# Patient Record
Sex: Female | Born: 1955
Health system: Southern US, Community
[De-identification: ages and names within clinical notes are randomized; demographics above are authoritative.]

## PROBLEM LIST (undated history)

## (undated) DIAGNOSIS — R011 Cardiac murmur, unspecified: Secondary | ICD-10-CM

## (undated) DIAGNOSIS — M545 Low back pain, unspecified: Secondary | ICD-10-CM

## (undated) DIAGNOSIS — M1990 Unspecified osteoarthritis, unspecified site: Secondary | ICD-10-CM

## (undated) DIAGNOSIS — D649 Anemia, unspecified: Secondary | ICD-10-CM

## (undated) DIAGNOSIS — E785 Hyperlipidemia, unspecified: Secondary | ICD-10-CM

## (undated) DIAGNOSIS — K219 Gastro-esophageal reflux disease without esophagitis: Secondary | ICD-10-CM

## (undated) DIAGNOSIS — T7840XA Allergy, unspecified, initial encounter: Secondary | ICD-10-CM

## (undated) DIAGNOSIS — I10 Essential (primary) hypertension: Secondary | ICD-10-CM

## (undated) HISTORY — PX: NECK SURGERY: SHX720

## (undated) HISTORY — PX: CARPAL TUNNEL RELEASE: SHX101

## (undated) HISTORY — PX: TUBAL LIGATION: SHX77

## (undated) HISTORY — DX: Hyperlipidemia, unspecified: E78.5

## (undated) HISTORY — DX: Allergy, unspecified, initial encounter: T78.40XA

---

## 2004-10-12 ENCOUNTER — Ambulatory Visit: Payer: Self-pay | Admitting: General Practice

## 2004-12-22 ENCOUNTER — Emergency Department: Payer: Self-pay | Admitting: Emergency Medicine

## 2005-08-11 ENCOUNTER — Ambulatory Visit (HOSPITAL_BASED_OUTPATIENT_CLINIC_OR_DEPARTMENT_OTHER): Admission: RE | Admit: 2005-08-11 | Discharge: 2005-08-11 | Payer: Self-pay | Admitting: Orthopedic Surgery

## 2005-11-07 ENCOUNTER — Ambulatory Visit: Payer: Self-pay | Admitting: Family Medicine

## 2005-11-11 ENCOUNTER — Emergency Department: Payer: Self-pay | Admitting: Emergency Medicine

## 2006-03-16 ENCOUNTER — Other Ambulatory Visit: Payer: Self-pay

## 2006-03-16 ENCOUNTER — Emergency Department: Payer: Self-pay | Admitting: Emergency Medicine

## 2006-03-17 ENCOUNTER — Ambulatory Visit: Payer: Self-pay | Admitting: Emergency Medicine

## 2006-06-02 ENCOUNTER — Other Ambulatory Visit: Payer: Self-pay

## 2006-06-02 ENCOUNTER — Emergency Department: Payer: Self-pay | Admitting: Emergency Medicine

## 2006-07-04 ENCOUNTER — Emergency Department: Payer: Self-pay | Admitting: Emergency Medicine

## 2006-08-14 ENCOUNTER — Emergency Department: Payer: Self-pay | Admitting: Emergency Medicine

## 2007-02-15 ENCOUNTER — Emergency Department: Payer: Self-pay | Admitting: Emergency Medicine

## 2007-06-28 ENCOUNTER — Emergency Department: Payer: Self-pay | Admitting: Emergency Medicine

## 2007-07-01 ENCOUNTER — Emergency Department: Payer: Self-pay | Admitting: Emergency Medicine

## 2007-08-10 ENCOUNTER — Emergency Department: Payer: Self-pay | Admitting: Emergency Medicine

## 2007-09-21 ENCOUNTER — Other Ambulatory Visit: Payer: Self-pay

## 2007-09-21 ENCOUNTER — Emergency Department: Payer: Self-pay | Admitting: Emergency Medicine

## 2007-09-23 ENCOUNTER — Emergency Department: Payer: Self-pay | Admitting: Emergency Medicine

## 2007-09-23 ENCOUNTER — Other Ambulatory Visit: Payer: Self-pay

## 2008-06-12 ENCOUNTER — Emergency Department: Payer: Self-pay | Admitting: Emergency Medicine

## 2008-06-13 ENCOUNTER — Emergency Department: Payer: Self-pay | Admitting: Internal Medicine

## 2008-09-30 DIAGNOSIS — I10 Essential (primary) hypertension: Secondary | ICD-10-CM | POA: Insufficient documentation

## 2008-09-30 DIAGNOSIS — I459 Conduction disorder, unspecified: Secondary | ICD-10-CM | POA: Insufficient documentation

## 2008-09-30 DIAGNOSIS — R319 Hematuria, unspecified: Secondary | ICD-10-CM | POA: Insufficient documentation

## 2008-10-17 ENCOUNTER — Emergency Department: Payer: Self-pay | Admitting: Emergency Medicine

## 2008-10-22 ENCOUNTER — Ambulatory Visit: Payer: Self-pay | Admitting: Family Medicine

## 2008-12-03 ENCOUNTER — Emergency Department: Payer: Self-pay | Admitting: Internal Medicine

## 2009-02-12 ENCOUNTER — Emergency Department: Payer: Self-pay | Admitting: Emergency Medicine

## 2009-10-14 ENCOUNTER — Emergency Department: Payer: Self-pay

## 2009-10-15 ENCOUNTER — Ambulatory Visit: Payer: Self-pay | Admitting: Family Medicine

## 2009-10-15 DIAGNOSIS — E559 Vitamin D deficiency, unspecified: Secondary | ICD-10-CM | POA: Insufficient documentation

## 2009-11-17 ENCOUNTER — Emergency Department: Payer: Self-pay | Admitting: Emergency Medicine

## 2009-12-24 ENCOUNTER — Ambulatory Visit: Payer: Self-pay | Admitting: Family Medicine

## 2009-12-24 DIAGNOSIS — R609 Edema, unspecified: Secondary | ICD-10-CM | POA: Insufficient documentation

## 2009-12-30 ENCOUNTER — Ambulatory Visit: Payer: Self-pay | Admitting: Orthopedic Surgery

## 2010-01-08 ENCOUNTER — Emergency Department: Payer: Self-pay | Admitting: Unknown Physician Specialty

## 2010-02-02 ENCOUNTER — Ambulatory Visit: Payer: Self-pay | Admitting: Pain Medicine

## 2010-02-15 ENCOUNTER — Ambulatory Visit: Payer: Self-pay | Admitting: Pain Medicine

## 2010-03-24 ENCOUNTER — Ambulatory Visit (HOSPITAL_COMMUNITY): Admission: RE | Admit: 2010-03-24 | Discharge: 2010-03-25 | Payer: Self-pay | Admitting: Neurosurgery

## 2010-04-29 ENCOUNTER — Encounter: Admission: RE | Admit: 2010-04-29 | Discharge: 2010-04-29 | Payer: Self-pay | Admitting: Neurosurgery

## 2010-07-06 ENCOUNTER — Encounter
Admission: RE | Admit: 2010-07-06 | Discharge: 2010-07-06 | Payer: Self-pay | Source: Home / Self Care | Attending: Neurosurgery | Admitting: Neurosurgery

## 2010-08-18 ENCOUNTER — Emergency Department: Payer: Self-pay | Admitting: Unknown Physician Specialty

## 2010-09-08 DIAGNOSIS — R079 Chest pain, unspecified: Secondary | ICD-10-CM

## 2010-09-08 DIAGNOSIS — I200 Unstable angina: Secondary | ICD-10-CM

## 2010-09-09 ENCOUNTER — Observation Stay: Payer: Self-pay | Admitting: Internal Medicine

## 2010-10-08 LAB — BASIC METABOLIC PANEL
CO2: 26 mEq/L (ref 19–32)
Calcium: 9.7 mg/dL (ref 8.4–10.5)
Chloride: 105 mEq/L (ref 96–112)
Creatinine, Ser: 0.75 mg/dL (ref 0.4–1.2)
Glucose, Bld: 76 mg/dL (ref 70–99)

## 2010-10-08 LAB — SURGICAL PCR SCREEN
MRSA, PCR: NEGATIVE
Staphylococcus aureus: POSITIVE — AB

## 2010-10-08 LAB — DIFFERENTIAL
Basophils Relative: 1 % (ref 0–1)
Eosinophils Absolute: 0.1 10*3/uL (ref 0.0–0.7)
Eosinophils Relative: 1 % (ref 0–5)
Monocytes Relative: 7 % (ref 3–12)
Neutrophils Relative %: 47 % (ref 43–77)

## 2010-10-08 LAB — CBC
MCH: 28.5 pg (ref 26.0–34.0)
MCHC: 33.8 g/dL (ref 30.0–36.0)
MCV: 84.2 fL (ref 78.0–100.0)
Platelets: 301 10*3/uL (ref 150–400)

## 2010-11-30 ENCOUNTER — Ambulatory Visit
Admission: RE | Admit: 2010-11-30 | Discharge: 2010-11-30 | Disposition: A | Discharge status: Home / Self Care | Payer: No Typology Code available for payment source | Source: Ambulatory Visit | Attending: Neurological Surgery | Admitting: Neurological Surgery

## 2010-11-30 ENCOUNTER — Other Ambulatory Visit: Payer: Self-pay | Admitting: Neurological Surgery

## 2010-11-30 DIAGNOSIS — M542 Cervicalgia: Secondary | ICD-10-CM

## 2010-12-10 NOTE — Op Note (Signed)
NAME:  Patton, Lindsay               ACCOUNT NO.:  [id number]   MEDICAL RECORD NO.:  [id number]          PATIENT TYPE:  AMB   LOCATION:  DSC                          FACILITY:  [facility]   PHYSICIAN:  Cindee Salt, M.D.       DATE OF BIRTH:  1956/01/13   DATE OF PROCEDURE:  08/11/2005  DATE OF DISCHARGE:                                 OPERATIVE REPORT   PREOPERATIVE DIAGNOSIS:  Carpal tunnel syndrome left hand.   POSTOPERATIVE DIAGNOSIS:  Carpal tunnel syndrome left hand.   OPERATION:  Decompression left median nerve.   SURGEON:  Cindee Salt, M.D.   ASSISTANT:  __________   ANESTHESIA:  Foreign-base IV regional.   HISTORY:  The patient is a 55 year old female with a history of carpal  tunnel syndrome, EMG nerve conduction is positive, which has not responded  to conservative treatment.   PROCEDURE:  The patient was brought to the operating room where a foreign-  base IV regional anesthetic was carried out without difficulty. After both  the patient and the surgeon marked the area, questions were answered. She  was prepped and draped using DuraPrep in the supine position, left arm free.  Adequate anesthesia was afforded and this included supplementation with 1%  Xylocaine without epinephrine. A longitudinal incision was made in the palm,  carried down through the subcutaneous tissue. Bleeders were  electrocauterized. Palmar fascia was split. Superficial palmar arch  identified. The flexor tendon to the ring and little finger identified to  the ulnar side of the median nerve. The carpal retinaculum was incised with  sharp dissection. A right-angle and Sewall retractor placed between the skin  and forearm fascia. The fascia was released for approximately 1.5 cm  proximal to the wrist crease under direct vision. The canal was explored. No  further lesions were identified. Tenosynovial tissue was moderately  thickened. The area of compression to the nerve was apparent. The wound was  irrigated. The skin was closed with interrupted #5-0 nylon sutures. A  sterile compressive dressing and splint was applied. The patient tolerated  the procedure well and was taken to the recovery room for observation in  satisfactory condition. She is discharged to home to return to the hand  center in [place] in 1 week on Vicodin.           ______________________________  Cindee Salt, M.D.     GK/MEDQ  D:  08/11/2005  T:  08/11/2005  Job:  [id number]   cc:   Cindee Salt, M.D.  Fax: [phone]

## 2011-08-16 ENCOUNTER — Ambulatory Visit: Payer: Self-pay | Admitting: Neurosurgery

## 2011-08-25 ENCOUNTER — Ambulatory Visit
Admission: RE | Admit: 2011-08-25 | Discharge: 2011-08-25 | Disposition: A | Discharge status: Home / Self Care | Payer: Medicaid Other | Source: Ambulatory Visit | Attending: Neurosurgery | Admitting: Neurosurgery

## 2011-08-25 ENCOUNTER — Other Ambulatory Visit: Payer: Self-pay | Admitting: Neurosurgery

## 2011-08-25 DIAGNOSIS — M542 Cervicalgia: Secondary | ICD-10-CM

## 2011-10-11 ENCOUNTER — Encounter (HOSPITAL_COMMUNITY): Payer: Self-pay | Admitting: Pharmacy Technician

## 2011-10-13 NOTE — Pre-Procedure Instructions (Signed)
20 KARLIAH KOWALCHUK  10/13/2011   Your procedure is scheduled on:  MARCH 27  Report to [facility] at 6:30 AM.  Call this number if you have problems the morning of surgery: [phone]   Remember:   Do not eat food:After Midnight.  May have clear liquids: up to 4 Hours before arrival. (2:30 AM)  Clear liquids include soda, tea, black coffee, apple or grape juice, broth.  Take these medicines the morning of surgery with A SIP OF WATER: NORVASC   Do not wear jewelry, make-up or nail polish.  Do not wear lotions, powders, or perfumes. You may wear deodorant.  Do not shave 48 hours prior to surgery.  Do not bring valuables to the hospital.  Contacts, dentures or bridgework may not be worn into surgery.  Leave suitcase in the car. After surgery it may be brought to your room.  For patients admitted to the hospital, checkout time is 11:00 AM the day of discharge.   Patients discharged the day of surgery will not be allowed to drive home.  Name and phone number of your driver: NA  Special Instructions: CHG Shower Use Special Wash: 1/2 bottle night before surgery and 1/2 bottle morning of surgery.   Please read over the following fact sheets that you were given: Pain Booklet, MRSA Information and Surgical Site Infection Prevention

## 2011-10-14 ENCOUNTER — Encounter (HOSPITAL_COMMUNITY): Payer: Self-pay

## 2011-10-14 ENCOUNTER — Encounter (HOSPITAL_COMMUNITY)
Admission: RE | Admit: 2011-10-14 | Discharge: 2011-10-14 | Disposition: A | Discharge status: Home / Self Care | Payer: Medicaid Other | Source: Ambulatory Visit | Attending: Anesthesiology | Admitting: Anesthesiology

## 2011-10-14 ENCOUNTER — Encounter (HOSPITAL_COMMUNITY)
Admission: RE | Admit: 2011-10-14 | Discharge: 2011-10-14 | Disposition: A | Discharge status: Home / Self Care | Payer: Medicaid Other | Source: Ambulatory Visit | Attending: Neurosurgery | Admitting: Neurosurgery

## 2011-10-14 ENCOUNTER — Other Ambulatory Visit: Payer: Self-pay

## 2011-10-14 HISTORY — DX: Essential (primary) hypertension: I10

## 2011-10-14 HISTORY — DX: Gastro-esophageal reflux disease without esophagitis: K21.9

## 2011-10-14 LAB — CBC
Hemoglobin: 14.2 g/dL (ref 12.0–15.0)
MCH: 28.9 pg (ref 26.0–34.0)
MCHC: 34.2 g/dL (ref 30.0–36.0)
Platelets: 299 10*3/uL (ref 150–400)
RDW: 12.4 % (ref 11.5–15.5)

## 2011-10-14 LAB — BASIC METABOLIC PANEL
BUN: 7 mg/dL (ref 6–23)
Calcium: 9.8 mg/dL (ref 8.4–10.5)
Creatinine, Ser: 0.66 mg/dL (ref 0.50–1.10)
GFR calc Af Amer: >90 mL/min (ref >90)
GFR calc non Af Amer: >90 mL/min (ref >90)
Glucose, Bld: 92 mg/dL (ref 70–99)
Potassium: 3.8 mEq/L (ref 3.5–5.1)

## 2011-10-14 MED ORDER — CHLORHEXIDINE GLUCONATE 4 % EX LIQD: 1.0000 "application " | Freq: Once | CUTANEOUS | Status: DC

## 2011-10-19 ENCOUNTER — Ambulatory Visit (HOSPITAL_COMMUNITY): Payer: Medicaid Other | Admitting: Anesthesiology

## 2011-10-19 ENCOUNTER — Ambulatory Visit (HOSPITAL_COMMUNITY): Payer: Medicaid Other

## 2011-10-19 ENCOUNTER — Encounter (HOSPITAL_COMMUNITY)
Admission: RE | Disposition: A | Discharge status: Home / Self Care | Payer: Self-pay | Source: Ambulatory Visit | Attending: Neurosurgery

## 2011-10-19 ENCOUNTER — Encounter (HOSPITAL_COMMUNITY): Payer: Self-pay | Admitting: Anesthesiology

## 2011-10-19 ENCOUNTER — Inpatient Hospital Stay (HOSPITAL_COMMUNITY)
Admission: RE | Admit: 2011-10-19 | Discharge: 2011-10-20 | DRG: 473 | Disposition: A | Discharge status: Home / Self Care | Payer: Medicaid Other | Source: Ambulatory Visit | Attending: Neurosurgery | Admitting: Neurosurgery

## 2011-10-19 DIAGNOSIS — Z886 Allergy status to analgesic agent status: Secondary | ICD-10-CM

## 2011-10-19 DIAGNOSIS — Z882 Allergy status to sulfonamides status: Secondary | ICD-10-CM

## 2011-10-19 DIAGNOSIS — Z87891 Personal history of nicotine dependence: Secondary | ICD-10-CM

## 2011-10-19 DIAGNOSIS — M4802 Spinal stenosis, cervical region: Secondary | ICD-10-CM | POA: Diagnosis present

## 2011-10-19 DIAGNOSIS — M47812 Spondylosis without myelopathy or radiculopathy, cervical region: Principal | ICD-10-CM | POA: Diagnosis present

## 2011-10-19 HISTORY — PX: ANTERIOR CERVICAL DECOMP/DISCECTOMY FUSION: SHX1161

## 2011-10-19 SURGERY — ANTERIOR CERVICAL DECOMPRESSION/DISCECTOMY FUSION 1 LEVEL/HARDWARE REMOVAL
Anesthesia: General

## 2011-10-19 MED ORDER — DEXAMETHASONE SODIUM PHOSPHATE 10 MG/ML IJ SOLN
INTRAMUSCULAR | Status: DC | PRN
  Administered 2011-10-19: 10 mg via INTRAVENOUS

## 2011-10-19 MED ORDER — AMLODIPINE BESYLATE 5 MG PO TABS
5.0000 mg | ORAL_TABLET | Freq: Every day | ORAL | Status: DC
  Administered 2011-10-19 – 2011-10-20 (×2): 5 mg via ORAL
  Filled 2011-10-19 (×2): qty 1

## 2011-10-19 MED ORDER — EPHEDRINE SULFATE 50 MG/ML IJ SOLN
INTRAMUSCULAR | Status: DC | PRN
  Administered 2011-10-19: 5 mg via INTRAVENOUS

## 2011-10-19 MED ORDER — CEFAZOLIN SODIUM 1-5 GM-% IV SOLN
INTRAVENOUS | Status: DC | PRN
  Administered 2011-10-19: 1 g via INTRAVENOUS

## 2011-10-19 MED ORDER — ROCURONIUM BROMIDE 100 MG/10ML IV SOLN
INTRAVENOUS | Status: DC | PRN
  Administered 2011-10-19: 50 mg via INTRAVENOUS

## 2011-10-19 MED ORDER — LACTATED RINGERS IV SOLN
INTRAVENOUS | Status: DC | PRN
  Administered 2011-10-19 (×2): via INTRAVENOUS

## 2011-10-19 MED ORDER — SODIUM CHLORIDE 0.9 % IV SOLN
INTRAVENOUS | Status: AC
  Filled 2011-10-19: qty 500

## 2011-10-19 MED ORDER — CEFAZOLIN SODIUM 1-5 GM-% IV SOLN
1.0000 g | Freq: Three times a day (TID) | INTRAVENOUS | Status: AC
  Administered 2011-10-19 – 2011-10-20 (×2): 1 g via INTRAVENOUS
  Filled 2011-10-19 (×2): qty 50

## 2011-10-19 MED ORDER — LIDOCAINE HCL 4 % MT SOLN
OROMUCOSAL | Status: DC | PRN
  Administered 2011-10-19: 4 mL via TOPICAL

## 2011-10-19 MED ORDER — HYDROMORPHONE HCL PF 1 MG/ML IJ SOLN: 0.5000 mg | INTRAMUSCULAR | Status: DC | PRN

## 2011-10-19 MED ORDER — ONDANSETRON HCL 4 MG/2ML IJ SOLN
INTRAMUSCULAR | Status: DC | PRN
  Administered 2011-10-19: 4 mg via INTRAVENOUS

## 2011-10-19 MED ORDER — CYCLOBENZAPRINE HCL 10 MG PO TABS
10.0000 mg | ORAL_TABLET | Freq: Three times a day (TID) | ORAL | Status: DC | PRN
  Administered 2011-10-20 (×2): 10 mg via ORAL
  Filled 2011-10-19 (×2): qty 1

## 2011-10-19 MED ORDER — CEFAZOLIN SODIUM 1-5 GM-% IV SOLN
INTRAVENOUS | Status: AC
  Filled 2011-10-19: qty 50

## 2011-10-19 MED ORDER — MUPIROCIN 2 % EX OINT
TOPICAL_OINTMENT | CUTANEOUS | Status: AC
  Administered 2011-10-19: 1 via NASAL
  Filled 2011-10-19: qty 22

## 2011-10-19 MED ORDER — MIDAZOLAM HCL 5 MG/5ML IJ SOLN
INTRAMUSCULAR | Status: DC | PRN
  Administered 2011-10-19: 2 mg via INTRAVENOUS

## 2011-10-19 MED ORDER — ALUM & MAG HYDROXIDE-SIMETH 200-200-20 MG/5ML PO SUSP: 30.0000 mL | Freq: Four times a day (QID) | ORAL | Status: DC | PRN

## 2011-10-19 MED ORDER — GLYCOPYRROLATE 0.2 MG/ML IJ SOLN
INTRAMUSCULAR | Status: DC | PRN
  Administered 2011-10-19: 0.4 mg via INTRAVENOUS

## 2011-10-19 MED ORDER — ONDANSETRON HCL 4 MG/2ML IJ SOLN
INTRAMUSCULAR | Status: AC
  Administered 2011-10-19: 4 mg
  Filled 2011-10-19: qty 2

## 2011-10-19 MED ORDER — BACITRACIN 50000 UNITS IM SOLR
INTRAMUSCULAR | Status: AC
  Filled 2011-10-19: qty 1

## 2011-10-19 MED ORDER — PROPOFOL 10 MG/ML IV EMUL
INTRAVENOUS | Status: DC | PRN
  Administered 2011-10-19: 150 mg via INTRAVENOUS

## 2011-10-19 MED ORDER — MORPHINE SULFATE 2 MG/ML IJ SOLN: 0.0500 mg/kg | INTRAMUSCULAR | Status: DC | PRN

## 2011-10-19 MED ORDER — SODIUM CHLORIDE 0.9 % IJ SOLN
3.0000 mL | Freq: Two times a day (BID) | INTRAMUSCULAR | Status: DC
  Administered 2011-10-19 – 2011-10-20 (×2): 3 mL via INTRAVENOUS

## 2011-10-19 MED ORDER — SODIUM CHLORIDE 0.9 % IR SOLN
Status: DC | PRN
  Administered 2011-10-19: 09:00:00

## 2011-10-19 MED ORDER — VITAMIN D (ERGOCALCIFEROL) 1.25 MG (50000 UNIT) PO CAPS: 50000.0000 [IU] | ORAL_CAPSULE | ORAL | Status: DC

## 2011-10-19 MED ORDER — ACETAMINOPHEN 325 MG PO TABS: 650.0000 mg | ORAL_TABLET | ORAL | Status: DC | PRN

## 2011-10-19 MED ORDER — PHENOL 1.4 % MT LIQD: 1.0000 | OROMUCOSAL | Status: DC | PRN

## 2011-10-19 MED ORDER — HYDROMORPHONE HCL PF 1 MG/ML IJ SOLN: 0.2500 mg | INTRAMUSCULAR | Status: DC | PRN

## 2011-10-19 MED ORDER — MENTHOL 3 MG MT LOZG: 1.0000 | LOZENGE | OROMUCOSAL | Status: DC | PRN

## 2011-10-19 MED ORDER — LIDOCAINE HCL (CARDIAC) 20 MG/ML IV SOLN
INTRAVENOUS | Status: DC | PRN
  Administered 2011-10-19: 20 mg via INTRAVENOUS

## 2011-10-19 MED ORDER — FERROUS SULFATE 325 (65 FE) MG PO TABS
325.0000 mg | ORAL_TABLET | Freq: Every day | ORAL | Status: DC
  Administered 2011-10-20: 325 mg via ORAL
  Filled 2011-10-19 (×2): qty 1

## 2011-10-19 MED ORDER — VECURONIUM BROMIDE 10 MG IV SOLR
INTRAVENOUS | Status: DC | PRN
  Administered 2011-10-19: 2 mg via INTRAVENOUS
  Administered 2011-10-19 (×2): 1 mg via INTRAVENOUS

## 2011-10-19 MED ORDER — SUFENTANIL CITRATE 50 MCG/ML IV SOLN
INTRAVENOUS | Status: DC | PRN
  Administered 2011-10-19: 10 ug via INTRAVENOUS
  Administered 2011-10-19 (×2): 5 ug via INTRAVENOUS

## 2011-10-19 MED ORDER — THROMBIN 5000 UNITS EX SOLR
CUTANEOUS | Status: DC | PRN
  Administered 2011-10-19 (×3): 5000 [IU] via TOPICAL

## 2011-10-19 MED ORDER — OXYCODONE-ACETAMINOPHEN 5-325 MG PO TABS
1.0000 | ORAL_TABLET | ORAL | Status: DC | PRN
  Administered 2011-10-19: 2 via ORAL
  Filled 2011-10-19: qty 2

## 2011-10-19 MED ORDER — ACETAMINOPHEN 650 MG RE SUPP: 650.0000 mg | RECTAL | Status: DC | PRN

## 2011-10-19 MED ORDER — ONDANSETRON HCL 4 MG/2ML IJ SOLN
4.0000 mg | INTRAMUSCULAR | Status: DC | PRN
  Administered 2011-10-19: 4 mg via INTRAVENOUS
  Filled 2011-10-19: qty 2

## 2011-10-19 MED ORDER — NEOSTIGMINE METHYLSULFATE 1 MG/ML IJ SOLN
INTRAMUSCULAR | Status: DC | PRN
  Administered 2011-10-19: 3 mg via INTRAVENOUS

## 2011-10-19 SURGICAL SUPPLY — 82 items
ADH SKN CLS APL DERMABOND .7 (GAUZE/BANDAGES/DRESSINGS)
ALLOGRAFT CERV LORD 11X14X10 (Bone Implant) ×1 IMPLANT
APL SKNCLS STERI-STRIP NONHPOA (GAUZE/BANDAGES/DRESSINGS) ×1
BAG DECANTER FOR FLEXI CONT (MISCELLANEOUS) ×2 IMPLANT
BENZOIN TINCTURE PRP APPL 2/3 (GAUZE/BANDAGES/DRESSINGS) ×2 IMPLANT
BONE CERV LORDOTIC 14.5X12X9 (Bone Implant) ×2 IMPLANT
BRUSH SCRUB EZ PLAIN DRY (MISCELLANEOUS) ×2 IMPLANT
BUR MATCHSTICK NEURO 3.0 LAGG (BURR) ×2 IMPLANT
CANISTER SUCTION 2500CC (MISCELLANEOUS) ×2 IMPLANT
CLOTH BEACON ORANGE TIMEOUT ST (SAFETY) ×2 IMPLANT
CONT SPEC 4OZ CLIKSEAL STRL BL (MISCELLANEOUS) ×2 IMPLANT
DECANTER SPIKE VIAL GLASS SM (MISCELLANEOUS) ×2 IMPLANT
DERMABOND ADVANCED (GAUZE/BANDAGES/DRESSINGS)
DERMABOND ADVANCED .7 DNX12 (GAUZE/BANDAGES/DRESSINGS) IMPLANT
DRAIN SNY WOU 7FLT (WOUND CARE) ×1 IMPLANT
DRAPE C-ARM 42X72 X-RAY (DRAPES) ×4 IMPLANT
DRAPE LAPAROTOMY 100X72 PEDS (DRAPES) ×2 IMPLANT
DRAPE MICROSCOPE ZEISS OPMI (DRAPES) ×2 IMPLANT
DRAPE POUCH INSTRU U-SHP 10X18 (DRAPES) ×2 IMPLANT
DRILL BIT (BIT) ×1 IMPLANT
DRSG OPSITE 4X5.5 SM (GAUZE/BANDAGES/DRESSINGS) ×2 IMPLANT
ELECT COATED BLADE 2.86 ST (ELECTRODE) ×2 IMPLANT
ELECT REM PT RETURN 9FT ADLT (ELECTROSURGICAL) ×2
ELECTRODE REM PT RTRN 9FT ADLT (ELECTROSURGICAL) ×1 IMPLANT
EVACUATOR SILICONE 100CC (DRAIN) ×1 IMPLANT
GAUZE SPONGE 4X4 16PLY XRAY LF (GAUZE/BANDAGES/DRESSINGS) IMPLANT
GLOVE BIO SURGEON STRL SZ 6.5 (GLOVE) IMPLANT
GLOVE BIO SURGEON STRL SZ7 (GLOVE) IMPLANT
GLOVE BIO SURGEON STRL SZ7.5 (GLOVE) IMPLANT
GLOVE BIO SURGEON STRL SZ8 (GLOVE) ×2 IMPLANT
GLOVE BIO SURGEON STRL SZ8.5 (GLOVE) IMPLANT
GLOVE BIOGEL M 8.0 STRL (GLOVE) IMPLANT
GLOVE BIOGEL PI IND STRL 8 (GLOVE) IMPLANT
GLOVE BIOGEL PI INDICATOR 8 (GLOVE) ×3
GLOVE ECLIPSE 6.5 STRL STRAW (GLOVE) IMPLANT
GLOVE ECLIPSE 7.0 STRL STRAW (GLOVE) IMPLANT
GLOVE ECLIPSE 7.5 STRL STRAW (GLOVE) IMPLANT
GLOVE ECLIPSE 8.0 STRL XLNG CF (GLOVE) IMPLANT
GLOVE ECLIPSE 8.5 STRL (GLOVE) IMPLANT
GLOVE EXAM NITRILE LRG STRL (GLOVE) IMPLANT
GLOVE EXAM NITRILE MD LF STRL (GLOVE) IMPLANT
GLOVE EXAM NITRILE XL STR (GLOVE) IMPLANT
GLOVE EXAM NITRILE XS STR PU (GLOVE) IMPLANT
GLOVE INDICATOR 6.5 STRL GRN (GLOVE) IMPLANT
GLOVE INDICATOR 7.0 STRL GRN (GLOVE) IMPLANT
GLOVE INDICATOR 7.5 STRL GRN (GLOVE) IMPLANT
GLOVE INDICATOR 8.0 STRL GRN (GLOVE) IMPLANT
GLOVE INDICATOR 8.5 STRL (GLOVE) ×4 IMPLANT
GLOVE OPTIFIT SS 8.0 STRL (GLOVE) IMPLANT
GLOVE SURG SS PI 6.5 STRL IVOR (GLOVE) IMPLANT
GOWN BRE IMP SLV AUR LG STRL (GOWN DISPOSABLE) ×2 IMPLANT
GOWN BRE IMP SLV AUR XL STRL (GOWN DISPOSABLE) ×3 IMPLANT
GOWN STRL REIN 2XL LVL4 (GOWN DISPOSABLE) ×4 IMPLANT
GRAFT BNE SPCR VG2 14.5X12X9 (Bone Implant) IMPLANT
HEAD HALTER (SOFTGOODS) ×2 IMPLANT
HEMOSTAT POWDER KIT SURGIFOAM (HEMOSTASIS) ×1 IMPLANT
KIT BASIN OR (CUSTOM PROCEDURE TRAY) ×2 IMPLANT
KIT ROOM TURNOVER OR (KITS) ×2 IMPLANT
NDL HYPO 18GX1.5 BLUNT FILL (NEEDLE) ×1 IMPLANT
NDL SPNL 20GX3.5 QUINCKE YW (NEEDLE) ×1 IMPLANT
NEEDLE HYPO 18GX1.5 BLUNT FILL (NEEDLE) ×2 IMPLANT
NEEDLE SPNL 20GX3.5 QUINCKE YW (NEEDLE) ×2 IMPLANT
NS IRRIG 1000ML POUR BTL (IV SOLUTION) ×2 IMPLANT
PACK LAMINECTOMY NEURO (CUSTOM PROCEDURE TRAY) ×2 IMPLANT
PAD ARMBOARD 7.5X6 YLW CONV (MISCELLANEOUS) ×6 IMPLANT
PLATE 3 57.5XLCK NS SPNE CVD (Plate) IMPLANT
PLATE 3 ATLANTIS TRANS (Plate) ×2 IMPLANT
RUBBERBAND STERILE (MISCELLANEOUS) ×4 IMPLANT
SCREW 4.5X13MM (Screw) ×4 IMPLANT
SCREW ST FIX 4 ATL 3120213 (Screw) ×2 IMPLANT
SPONGE GAUZE 4X4 12PLY (GAUZE/BANDAGES/DRESSINGS) ×2 IMPLANT
SPONGE INTESTINAL PEANUT (DISPOSABLE) ×2 IMPLANT
SPONGE SURGIFOAM ABS GEL SZ50 (HEMOSTASIS) IMPLANT
STRIP CLOSURE SKIN 1/2X4 (GAUZE/BANDAGES/DRESSINGS) ×2 IMPLANT
SUT VIC AB 3-0 SH 8-18 (SUTURE) ×2 IMPLANT
SUT VICRYL 4-0 PS2 18IN ABS (SUTURE) ×2 IMPLANT
SYR 20ML ECCENTRIC (SYRINGE) ×2 IMPLANT
TAPE CLOTH 4X10 WHT NS (GAUZE/BANDAGES/DRESSINGS) IMPLANT
TOWEL OR 17X24 6PK STRL BLUE (TOWEL DISPOSABLE) ×2 IMPLANT
TOWEL OR 17X26 10 PK STRL BLUE (TOWEL DISPOSABLE) ×2 IMPLANT
TRAP SPECIMEN MUCOUS 40CC (MISCELLANEOUS) ×2 IMPLANT
WATER STERILE IRR 1000ML POUR (IV SOLUTION) ×2 IMPLANT

## 2011-10-19 NOTE — H&P (Signed)
[name] is an 56 y.o. female.   Chief Complaint: Neck and bilateral shoulder pain HPI: This is a very pleasant of erythema was undergone previous C5-C7 ACDF who is a progress worsening neck and bilateral shoulder pain range her deltoids bilaterally workup and imaging revealed spondylosis above her previous fusion at C4-5 is failed all forms of conservative treatment physical therapy anti-inflammatories and steroid the patient was recommended anti-service and fusion C4-5 with x-rays of fusion removal of hardware C5-C7 the cystoscopy the risks benefits of the operation with her as well as her course and expectations of outcome alternatives of surgery she says she understands and agrees to proceed forward.  Past Medical History  Diagnosis Date  . Hypertension   . GERD (gastroesophageal reflux disease)     Past Surgical History  Procedure Date  . Neck surgery   . Tubal ligation     No family history on file. Social History:  reports that she has quit smoking. Her smoking use included Cigarettes. She does not have any smokeless tobacco history on file. She reports that she does not drink alcohol or use illicit drugs.  Allergies:  Allergies  Allergen Reactions  . Aspirin Nausea Only  . Sulfa Antibiotics Hives and Nausea Only    Medications Prior to Admission  Medication Dose Route Frequency Provider Last Rate Last Dose  . mupirocin ointment (BACTROBAN) 2 %        1 application at 10/19/11 1610   Medications Prior to Admission  Medication Sig Dispense Refill  . amLODipine (NORVASC) 5 MG tablet Take 5 mg by mouth daily.      . Cholecalciferol (VITAMIN D) 2000 UNITS tablet Take 2,000 Units by mouth daily.      . ferrous sulfate 325 (65 FE) MG tablet Take 325 mg by mouth daily with breakfast.      . fish oil-omega-3 fatty acids 1000 MG capsule Take 1 g by mouth daily.      Marland Kitchen OVER THE COUNTER MEDICATION Take 1 tablet by mouth daily as needed. Gas Relief - for indigestion      .  vitamin A 8000 UNIT capsule Take 8,000 Units by mouth daily.      . Vitamin D, Ergocalciferol, (DRISDOL) 50000 UNITS CAPS Take 50,000 Units by mouth every 30 (thirty) days.        No results found for this or any previous visit (from the past 48 hour(s)). No results found.  Review of Systems  Constitutional: Negative.   HENT: Positive for neck pain.   Eyes: Negative.   Respiratory: Negative.   Cardiovascular: Negative.   Gastrointestinal: Negative.   Genitourinary: Negative.   Musculoskeletal: Positive for myalgias.  Neurological: Positive for tingling.    Blood pressure 148/80, pulse 60, temperature 97.9 F (36.6 C), temperature source Oral, resp. rate 18, SpO2 97.00%. Physical Exam  Constitutional: She is oriented to person, place, and time. She appears well-developed and well-nourished.  HENT:  Head: Normocephalic and atraumatic.  Cardiovascular: Regular rhythm.   Respiratory: Breath sounds normal.  GI: Soft.  Neurological: She is alert and oriented to person, place, and time. She has normal strength. GCS eye subscore is 4. GCS verbal subscore is 5. GCS motor subscore is 6.  Reflex Scores:      Tricep reflexes are 1+ on the right side and 1+ on the left side.      Bicep reflexes are 1+ on the right side and 1+ on the left side.  Brachioradialis reflexes are 1+ on the right side and 1+ on the left side.      Patellar reflexes are 1+ on the right side and 1+ on the left side.      Achilles reflexes are 1+ on the right side and 1+ on the left side.      Patient is 5 out of 5 strength in her deltoid, biceps, triceps, and intrinsics wrist flexion extension.     Assessment/Plan 56 year old female presents for ACDF C4-5.  Labrian Torregrossa P 10/19/2011, 8:06 AM

## 2011-10-19 NOTE — Preoperative (Signed)
Beta Blockers   Reason not to administer Beta Blockers:Not Applicable 

## 2011-10-19 NOTE — Op Note (Signed)
Preoperative diagnosis: Cervical spondylosis with stenosis at C4-5 possible pseudoarthrosis C6-7  Postoperative diagnosis: Same with definite pseudoarthrosis at C6-7  Procedure: Exploration of fusion removal of hardware C5-C7 with pseudoarthrosis C6-7 redo ACDF at C6-7 using allograft, anterior cervical discectomy fusion C4-5 using a 7.75 mm allograft wedge anterior cervical plating from C4-C7 using Atlantis translational plating system with 4-13 mm rescue screws into 13 mm fixed angle screws  Surgeon: Jillyn Hidden Guneet Delpino  Anesthesia: Gen.  EBL: Minimal  History of present illness: Patient is a 7 out of female presented with persistent neck pain in both shoulders L. progressive breakdown spondylosis cervical stenosis at C4-5 and flexion extension C-spine films also showed potential pseudoarthrosis C6-7 the patient was recommended expiration of fusion removal of hardware C5-C7 ACDF C4-5. The patient failed all forms of conservative treatment back his first metatarsal the operation as well as perioperative course and expectations of outcome and alternatives to surgery. She understands and agreed to proceed forward.  Operative procedure: Patient was brought in the or was induced under general anesthesia position supine Extension 5 pounds of halter traction her neck was prepped and draped in routine sterile fashion her old incision was localized a progression and felt to be at acquitted his new level of C4-5 so this was opened up and the scar tissues dissected free in the avascular plane to the sternomastoid and strap as was developed down to the prevertebral fascia and the prevertebral fascia was dissected away with Kitners. The plate was immediate identified this was dissected free and the screws were removed the plate was removed. C4-5 level was dissected free as well this point working looking over the fusion masses at C5-6 and C6-7 the C5-6 fusion mass appeared to be solid however it BP graft at C6-7 was  mobile with respect to the C6 and C7 vertebral bodies consistent with a pseudoarthrosis. So the lateral margins of this interspace was drilled down in a P. Cindi Carbon is actually drilled away and removed compression plate was then achieved and a 10 x 11 mm x 14 mm Zimmer allograft wedges inserted C6-7 dissected apposition the endplates area retractor was repositioned and for C4-5. Large adjustments were bitten off with a C4 vertebral body the spaces and drilled down the posterior aspect complex then using a 1 mm in punch the undersurface the posterior annuluscomplex was removed exposing the PLL which was removed in piecemeal fashion exposing the thecal sac. There was extensive amount of a posterior disc herniation that was removed as well as a large aspect of the C4 vertebral body this was aggressively and bitten decompress the central canal watch across laterally the C5 pedicle was identified the C5 nerve roots, social pedicle. Her adequate central and foraminal decompression achieved the endplates were scraped a 7.575 mm allograft wedge was then selected and inserted proximally 12 mm deep intervertebral alignment. Then a proper size of Atlantis translational plate was selected rescue screws were placed in the body of C7 and the screw holes were drilled C4 and the additional screws were placed in the C5 vertebral body the C6 screws were no longer competent secondary to drilling out the previous graft. This arthroscopy from the position of the implants plate screws was in to proceed her get meticulous hemostasis was maintained a J-P drain was placed and the platysmas reapproximated and Vicryl and skin was closed running 4 subcuticular benzoin Steri-Strips were applied patient recovered in stable condition at the end of case all needle counts sponge counts were correct.

## 2011-10-19 NOTE — Anesthesia Procedure Notes (Addendum)
Performed by: Lovie Chol   Procedure Name: Intubation Date/Time: 10/19/2011 8:42 AM Performed by: Lovie Chol Pre-anesthesia Checklist: Patient identified, Emergency Drugs available, Suction available and Timeout performed Patient Re-evaluated:Patient Re-evaluated prior to inductionOxygen Delivery Method: Circle system utilized Preoxygenation: Pre-oxygenation with 100% oxygen Intubation Type: IV induction Ventilation: Oral airway inserted - appropriate to patient size and Mask ventilation without difficulty Laryngoscope Size: Miller and 2 Grade View: Grade II Tube type: Oral Tube size: 7.5 mm Number of attempts: 1 Airway Equipment and Method: Stylet Placement Confirmation: ETT inserted through vocal cords under direct vision,  positive ETCO2,  CO2 detector and breath sounds checked- equal and bilateral Secured at: 22 cm Tube secured with: Tape Dental Injury: Teeth and Oropharynx as per pre-operative assessment

## 2011-10-19 NOTE — Transfer of Care (Signed)
Immediate Anesthesia Transfer of Care Note  Patient: [name]  Procedure(s) Performed: Procedure(s) (LRB): ANTERIOR CERVICAL DECOMPRESSION/DISCECTOMY FUSION 1 LEVEL/HARDWARE REMOVAL (N/A)  Patient Location: PACU  Anesthesia Type: General  Level of Consciousness: sedated  Airway & Oxygen Therapy: Patient Spontanous Breathing and Patient connected to nasal cannula oxygen  Post-op Assessment: Report given to PACU RN and Post -op Vital signs reviewed and stable  Post vital signs: Reviewed and stable  Complications: No apparent anesthesia complications

## 2011-10-19 NOTE — Anesthesia Postprocedure Evaluation (Signed)
  Anesthesia Post-op Note  Patient: [name]  Procedure(s) Performed: Procedure(s) (LRB): ANTERIOR CERVICAL DECOMPRESSION/DISCECTOMY FUSION 1 LEVEL/HARDWARE REMOVAL (N/A)  Patient Location: PACU  Anesthesia Type: General  Level of Consciousness: awake  Airway and Oxygen Therapy: Patient Spontanous Breathing  Post-op Pain: mild  Post-op Assessment: Post-op Vital signs reviewed  Post-op Vital Signs: Reviewed  Complications: No apparent anesthesia complications

## 2011-10-19 NOTE — Anesthesia Preprocedure Evaluation (Addendum)
Anesthesia Evaluation  Patient identified by MRN, date of birth, ID band Patient awake    Reviewed: Allergy & Precautions, H&P , NPO status , Patient's Chart, lab work & pertinent test results  History of Anesthesia Complications Negative for: history of anesthetic complications  Airway Mallampati: II TM Distance: >3 FB Neck ROM: Limited    Dental  (+) Teeth Intact and Dental Advisory Given   Pulmonary neg pulmonary ROS,  breath sounds clear to auscultation        Cardiovascular hypertension, Pt. on medications Rhythm:Regular Rate:Normal     Neuro/Psych negative neurological ROS     GI/Hepatic Neg liver ROS, GERD-  Controlled,  Endo/Other  negative endocrine ROS  Renal/GU negative Renal ROS     Musculoskeletal   Abdominal   Peds  Hematology negative hematology ROS (+)   Anesthesia Other Findings   Reproductive/Obstetrics                        Anesthesia Physical Anesthesia Plan  ASA: III  Anesthesia Plan: General   Post-op Pain Management:    Induction: Intravenous  Airway Management Planned: Oral ETT  Additional Equipment:   Intra-op Plan:   Post-operative Plan: Extubation in OR  Informed Consent: I have reviewed the patients History and Physical, chart, labs and discussed the procedure including the risks, benefits and alternatives for the proposed anesthesia with the patient or authorized representative who has indicated his/her understanding and acceptance.     Plan Discussed with: CRNA  Anesthesia Plan Comments:         Anesthesia Quick Evaluation

## 2011-10-20 ENCOUNTER — Encounter (HOSPITAL_COMMUNITY): Payer: Self-pay | Admitting: Neurosurgery

## 2011-10-20 MED ORDER — OXYCODONE-ACETAMINOPHEN 5-325 MG PO TABS: 1.0000 | ORAL_TABLET | ORAL | Status: AC | PRN

## 2011-10-20 MED ORDER — CYCLOBENZAPRINE HCL 10 MG PO TABS: 10.0000 mg | ORAL_TABLET | Freq: Three times a day (TID) | ORAL | Status: AC | PRN

## 2011-10-20 NOTE — Discharge Summary (Signed)
  Physician Discharge Summary  Patient ID: [name] MRN: [id number] DOB/AGE: May 10, 1956 56 y.o.  Admit date: 10/19/2011 Discharge date: 10/20/2011  Admission Diagnoses: Cervical spondylosis at C4-5 pseudoarthrosis C6-7  Discharge Diagnoses: Same Active Problems:  * No active hospital problems. *    Discharged Condition: good  Hospital Course: Patient was admitted hospital underwent ACDF at C4-5 and a redo ACDF at C6-7 postoperatively the floor did very well but is Irving Burton voice and answers of discharge, postop day 1.  Consults: Significant Diagnostic Studies: Treatments: ACDF at C4-5 redo ACDF C6-7 Discharge Exam: Blood pressure 148/72, pulse 70, temperature 98.3 F (36.8 C), temperature source Oral, resp. rate 12, weight 77 kg (169 lb 12.1 oz), SpO2 94.00%. Thank out of 5 wound clean and dry and  Disposition: Home   Medication List  As of 10/20/2011  7:47 AM   TAKE these medications         amLODipine 5 MG tablet   Commonly known as: NORVASC   Take 5 mg by mouth daily.      cyclobenzaprine 10 MG tablet   Commonly known as: FLEXERIL   Take 1 tablet (10 mg total) by mouth 3 (three) times daily as needed for muscle spasms.      ferrous sulfate 325 (65 FE) MG tablet   Take 325 mg by mouth daily with breakfast.      fish oil-omega-3 fatty acids 1000 MG capsule   Take 1 g by mouth daily.      OVER THE COUNTER MEDICATION   Take 1 tablet by mouth daily as needed. Gas Relief - for indigestion      oxyCODONE-acetaminophen 5-325 MG per tablet   Commonly known as: PERCOCET   Take 1-2 tablets by mouth every 4 (four) hours as needed.      vitamin A 8000 UNIT capsule   Take 8,000 Units by mouth daily.      Vitamin D (Ergocalciferol) 50000 UNITS Caps   Commonly known as: DRISDOL   Take 50,000 Units by mouth every 30 (thirty) days.      Vitamin D 2000 UNITS tablet   Take 2,000 Units by mouth daily.             Signed: Falesha Schommer P 10/20/2011, 7:47 AM

## 2011-10-20 NOTE — Discharge Instructions (Signed)
Wound Care Keep incision covered and dry for one week.  If you shower prior to then, cover incision with plastic wrap.  You may remove outer bandage after one week and shower.  Do not put any creams, lotions, or ointments on incision. Leave steri-strips on neck.  They will fall off by themselves. Activity Walk each and every day, increasing distance each day. No lifting greater than 5 lbs.  Avoid excessive neck motion. No driving. Wear neck brace at all times except when showering or otherwise instructed. Diet Resume your normal diet.  Return to Work Will be discussed at you follow up appointment. Call Your Doctor If Any of These Occur Redness, drainage, or swelling at the wound.  Temperature greater than 101 degrees. Severe pain not relieved by pain medication. Increased difficulty swallowing.  Incision starts to come apart. Follow Up Appt Call today for appointment in 1-2 weeks ([phone]) or for problems.  If you have any hardware placed in your spine, you will need an x-ray before your appointment.

## 2011-10-20 NOTE — Progress Notes (Signed)
Subjective: Patient reports Is doing better she's got a little bit of left shoulder pain but the right shoulder pain is gone no new numbness tingling arms with swallowing is okay  Objective: Vital signs in last 24 hours: Temp:  [97.4 F (36.3 C)-98.3 F (36.8 C)] 98.3 F (36.8 C) (03/28 0400) Pulse Rate:  [52-90] 70  (03/28 0400) Resp:  [12-26] 12  (03/28 0400) BP: (127-162)/(67-77) 148/72 mmHg (03/28 0400) SpO2:  [93 %-97 %] 94 % (03/28 0400)  Intake/Output from previous day: 03/27 0701 - 03/28 0700 In: 2020 [P.O.:720; I.V.:1300] Out: 440 [Urine:200; Emesis/NG output:100; Drains:65; Blood:75] Intake/Output this shift:    Strength is intact wound is clean and dry and  Lab Results: No results found for this basename: WBC:2,HGB:2,HCT:2,PLT:2 in the last 72 hours BMET No results found for this basename: NA:2,K:2,CL:2,CO2:2,GLUCOSE:2,BUN:2,CREATININE:2,CALCIUM:2 in the last 72 hours  Studies/Results: Dg Cervical Spine 2-3 Views  10/19/2011  *RADIOLOGY REPORT*  Clinical Data: 56 year old female undergoing cervical spine surgery.  CERVICAL SPINE - 2-3 VIEW  Comparison: 08/25/2011.  Fluoroscopy time of 0.2 minutes was utilized.  Findings: Three intraoperative lateral views of the cervical spine. Previously seen C5-C7 level ACDF plate has been replaced with a plate which extends from the C4 at least to the C7 level (may extend to T1, unclear due to lack of bone detail). Hardware at C6 and C7 has been removed.  Two new cortical screws connect the plate at C4.  IMPRESSION: Revision of the mid and lower cervical ACDF as above.  Original Report Authenticated By: Harley Hallmark, M.D.    Assessment/Plan: Is of a 1 ACDF plan discharge as morning  LOS: 1 day     Nayla Dias P 10/20/2011, 7:43 AM

## 2011-10-24 ENCOUNTER — Other Ambulatory Visit (HOSPITAL_COMMUNITY): Payer: Medicaid Other

## 2012-04-19 ENCOUNTER — Emergency Department: Payer: Self-pay | Admitting: Emergency Medicine

## 2012-04-19 LAB — COMPREHENSIVE METABOLIC PANEL
Albumin: 3.3 g/dL — ABNORMAL LOW (ref 3.4–5.0)
Alkaline Phosphatase: 114 U/L (ref 50–136)
Anion Gap: 10 (ref 7–16)
BUN: 7 mg/dL (ref 7–18)
Bilirubin,Total: 0.1 mg/dL — ABNORMAL LOW (ref 0.2–1.0)
Chloride: 103 mmol/L (ref 98–107)
Creatinine: 0.71 mg/dL (ref 0.60–1.30)
EGFR (African American): >60
Glucose: 98 mg/dL (ref 65–99)
Potassium: 3.5 mmol/L (ref 3.5–5.1)
SGOT(AST): 17 U/L (ref 15–37)
Sodium: 141 mmol/L (ref 136–145)
Total Protein: 7.4 g/dL (ref 6.4–8.2)

## 2012-04-19 LAB — CBC
HGB: 13.4 g/dL (ref 12.0–16.0)
RBC: 4.64 10*6/uL (ref 3.80–5.20)
WBC: 8 10*3/uL (ref 3.6–11.0)

## 2012-04-19 LAB — URINALYSIS, COMPLETE
Bacteria: NONE SEEN
Ketone: NEGATIVE
Nitrite: NEGATIVE
Ph: 5 (ref 4.5–8.0)
Protein: NEGATIVE
RBC,UR: 1 /HPF (ref 0–5)
Squamous Epithelial: <1
WBC UR: 1 /HPF (ref 0–5)

## 2012-09-25 ENCOUNTER — Emergency Department: Payer: Self-pay | Admitting: Emergency Medicine

## 2012-09-25 LAB — URINALYSIS, COMPLETE
Bilirubin,UR: NEGATIVE
Glucose,UR: NEGATIVE mg/dL (ref 0–75)
Ketone: NEGATIVE
Ph: 6 (ref 4.5–8.0)
RBC,UR: 3 /HPF (ref 0–5)
Squamous Epithelial: 1
WBC UR: 4 /HPF (ref 0–5)

## 2012-09-25 LAB — WET PREP, GENITAL

## 2013-01-16 LAB — HM PAP SMEAR: HM Pap smear: NEGATIVE

## 2013-02-20 ENCOUNTER — Ambulatory Visit: Payer: Self-pay | Admitting: Family Medicine

## 2013-02-20 LAB — HM MAMMOGRAPHY

## 2013-03-29 ENCOUNTER — Ambulatory Visit: Payer: Self-pay | Admitting: Neurosurgery

## 2013-04-24 ENCOUNTER — Other Ambulatory Visit: Payer: Self-pay | Admitting: Neurosurgery

## 2013-04-24 DIAGNOSIS — E041 Nontoxic single thyroid nodule: Secondary | ICD-10-CM

## 2013-05-23 ENCOUNTER — Ambulatory Visit
Admission: RE | Admit: 2013-05-23 | Discharge: 2013-05-23 | Disposition: A | Discharge status: Home / Self Care | Payer: Medicare Other | Source: Ambulatory Visit | Attending: Neurosurgery | Admitting: Neurosurgery

## 2013-05-23 DIAGNOSIS — E041 Nontoxic single thyroid nodule: Secondary | ICD-10-CM

## 2013-07-23 ENCOUNTER — Other Ambulatory Visit: Payer: Self-pay | Admitting: Neurosurgery

## 2013-07-23 DIAGNOSIS — E041 Nontoxic single thyroid nodule: Secondary | ICD-10-CM

## 2013-08-01 ENCOUNTER — Ambulatory Visit
Admission: RE | Admit: 2013-08-01 | Discharge: 2013-08-01 | Disposition: A | Discharge status: Home / Self Care | Payer: Medicare Other | Source: Ambulatory Visit | Attending: Neurosurgery | Admitting: Neurosurgery

## 2013-08-01 ENCOUNTER — Other Ambulatory Visit (HOSPITAL_COMMUNITY)
Admission: RE | Admit: 2013-08-01 | Discharge: 2013-08-01 | Disposition: A | Discharge status: Home / Self Care | Payer: Medicare Other | Source: Ambulatory Visit | Attending: Interventional Radiology | Admitting: Interventional Radiology

## 2013-08-01 DIAGNOSIS — E041 Nontoxic single thyroid nodule: Secondary | ICD-10-CM | POA: Insufficient documentation

## 2013-08-08 ENCOUNTER — Other Ambulatory Visit: Payer: Self-pay | Admitting: Neurosurgery

## 2013-08-19 ENCOUNTER — Encounter (HOSPITAL_COMMUNITY): Payer: Self-pay | Admitting: Pharmacy Technician

## 2013-08-20 NOTE — Pre-Procedure Instructions (Signed)
[name]  08/20/2013   Your procedure is scheduled on:  Mon, Feb 2 @ 7:30 AM  Report to [facility]  at 5:30 AM.  Call this number if you have problems the morning of surgery: [phone]   Remember:   Do not eat food or drink liquids after midnight.   Take these medicines the morning of surgery with A SIP OF WATER: Amlodipine(Norvasc)              Stop taking your Fish Oil. No Goody's,BC's,Aleve,Aspirin,Ibuprofen,or any Herbal Medications   Do not wear jewelry, make-up or nail polish.  Do not wear lotions, powders, or perfumes. You may wear deodorant.  Do not shave 48 hours prior to surgery.   Do not bring valuables to the hospital.  [facility] is not responsible                  for any belongings or valuables.               Contacts, dentures or bridgework may not be worn into surgery.  Leave suitcase in the car. After surgery it may be brought to your room.  For patients admitted to the hospital, discharge time is determined by your                treatment team.               Patients discharged the day of surgery will not be allowed to drive  home.    Special Instructions: Shower using CHG 2 nights before surgery and the night before surgery.  If you shower the day of surgery use CHG.  Use special wash - you have one bottle of CHG for all showers.  You should use approximately 1/3 of the bottle for each shower.   Please read over the following fact sheets that you were given: Pain Booklet, Coughing and Deep Breathing, MRSA Information and Surgical Site Infection Prevention

## 2013-08-21 ENCOUNTER — Encounter (HOSPITAL_COMMUNITY): Payer: Self-pay

## 2013-08-21 ENCOUNTER — Encounter (HOSPITAL_COMMUNITY)
Admission: RE | Admit: 2013-08-21 | Discharge: 2013-08-21 | Disposition: A | Discharge status: Home / Self Care | Payer: Medicare Other | Source: Ambulatory Visit | Attending: Anesthesiology | Admitting: Anesthesiology

## 2013-08-21 ENCOUNTER — Encounter (HOSPITAL_COMMUNITY)
Admission: RE | Admit: 2013-08-21 | Discharge: 2013-08-21 | Disposition: A | Discharge status: Home / Self Care | Payer: Medicare Other | Source: Ambulatory Visit | Attending: Neurosurgery | Admitting: Neurosurgery

## 2013-08-21 DIAGNOSIS — Z01812 Encounter for preprocedural laboratory examination: Secondary | ICD-10-CM | POA: Insufficient documentation

## 2013-08-21 DIAGNOSIS — Z01810 Encounter for preprocedural cardiovascular examination: Secondary | ICD-10-CM | POA: Insufficient documentation

## 2013-08-21 DIAGNOSIS — Z01818 Encounter for other preprocedural examination: Secondary | ICD-10-CM | POA: Insufficient documentation

## 2013-08-21 DIAGNOSIS — Z01811 Encounter for preprocedural respiratory examination: Secondary | ICD-10-CM | POA: Insufficient documentation

## 2013-08-21 HISTORY — DX: Low back pain: M54.5

## 2013-08-21 HISTORY — DX: Anemia, unspecified: D64.9

## 2013-08-21 HISTORY — DX: Low back pain, unspecified: M54.50

## 2013-08-21 HISTORY — DX: Unspecified osteoarthritis, unspecified site: M19.90

## 2013-08-21 LAB — CBC
HCT: 42.2 % (ref 36.0–46.0)
Hemoglobin: 14.6 g/dL (ref 12.0–15.0)
MCH: 29 pg (ref 26.0–34.0)
MCHC: 34.6 g/dL (ref 30.0–36.0)
MCV: 83.7 fL (ref 78.0–100.0)
PLATELETS: 297 10*3/uL (ref 150–400)
RBC: 5.04 MIL/uL (ref 3.87–5.11)
RDW: 12.8 % (ref 11.5–15.5)
WBC: 7.3 10*3/uL (ref 4.0–10.5)

## 2013-08-21 LAB — BASIC METABOLIC PANEL
BUN: 9 mg/dL (ref 6–23)
CALCIUM: 9.6 mg/dL (ref 8.4–10.5)
CO2: 28 mEq/L (ref 19–32)
CREATININE: 0.72 mg/dL (ref 0.50–1.10)
Chloride: 103 mEq/L (ref 96–112)
GFR calc non Af Amer: >90 mL/min (ref >90)
Glucose, Bld: 90 mg/dL (ref 70–99)
Potassium: 3.4 mEq/L — ABNORMAL LOW (ref 3.7–5.3)
SODIUM: 143 meq/L (ref 137–147)

## 2013-08-21 LAB — SURGICAL PCR SCREEN
MRSA, PCR: NEGATIVE
Staphylococcus aureus: POSITIVE — AB

## 2013-08-21 NOTE — Progress Notes (Signed)
Pt doesn't have a cardiologist but did see one about 76yrs ago and was sent by primary care as part of routine physical  Echo/Stress test done about 54yrs ago-to request from [facility] bc pt doesn't know who or where she went  Denies ever having a heart cath  Medical Md is Dr. Margarita Rana   Denies EKG or CXR in past yr

## 2013-08-21 NOTE — Progress Notes (Signed)
Mupirocin called in to [facility] in [place] on [address]

## 2013-08-22 NOTE — Progress Notes (Signed)
Anesthesia Chart Review:  Patient is a 58 year old female scheduled for C4-5, C5-6, C6-7 posterior fusion on 08/26/13 by Dr. Saintclair Halsted.  History includes non-smoker, HTN, anemia, GERD, arthritis, ACDF.  Preoperative EKG, CXR, labs noted.  Anticipate that she can proceed as planned.  George Hugh [facility]/Anesthesiology Phone [phone] 08/22/2013 1:50 PM

## 2013-08-26 ENCOUNTER — Inpatient Hospital Stay (HOSPITAL_COMMUNITY): Payer: Medicare HMO | Admitting: Certified Registered"

## 2013-08-26 ENCOUNTER — Inpatient Hospital Stay (HOSPITAL_COMMUNITY): Payer: Medicare HMO

## 2013-08-26 ENCOUNTER — Encounter (HOSPITAL_COMMUNITY): Payer: Medicare HMO | Admitting: Vascular Surgery

## 2013-08-26 ENCOUNTER — Inpatient Hospital Stay (HOSPITAL_COMMUNITY)
Admission: RE | Admit: 2013-08-26 | Discharge: 2013-08-28 | DRG: 473 | Disposition: A | Discharge status: Home / Self Care | Payer: Medicare HMO | Source: Ambulatory Visit | Attending: Neurosurgery | Admitting: Neurosurgery

## 2013-08-26 ENCOUNTER — Encounter (HOSPITAL_COMMUNITY): Payer: Self-pay | Admitting: *Deleted

## 2013-08-26 ENCOUNTER — Encounter (HOSPITAL_COMMUNITY)
Admission: RE | Disposition: A | Discharge status: Home / Self Care | Payer: Self-pay | Source: Ambulatory Visit | Attending: Neurosurgery

## 2013-08-26 DIAGNOSIS — Y831 Surgical operation with implant of artificial internal device as the cause of abnormal reaction of the patient, or of later complication, without mention of misadventure at the time of the procedure: Secondary | ICD-10-CM | POA: Diagnosis present

## 2013-08-26 DIAGNOSIS — Z889 Allergy status to unspecified drugs, medicaments and biological substances status: Secondary | ICD-10-CM

## 2013-08-26 DIAGNOSIS — Z79899 Other long term (current) drug therapy: Secondary | ICD-10-CM

## 2013-08-26 DIAGNOSIS — S129XXA Fracture of neck, unspecified, initial encounter: Secondary | ICD-10-CM | POA: Diagnosis present

## 2013-08-26 DIAGNOSIS — I10 Essential (primary) hypertension: Secondary | ICD-10-CM | POA: Diagnosis present

## 2013-08-26 DIAGNOSIS — M129 Arthropathy, unspecified: Secondary | ICD-10-CM | POA: Diagnosis present

## 2013-08-26 DIAGNOSIS — T84498A Other mechanical complication of other internal orthopedic devices, implants and grafts, initial encounter: Principal | ICD-10-CM | POA: Diagnosis present

## 2013-08-26 DIAGNOSIS — D649 Anemia, unspecified: Secondary | ICD-10-CM | POA: Diagnosis present

## 2013-08-26 DIAGNOSIS — K219 Gastro-esophageal reflux disease without esophagitis: Secondary | ICD-10-CM | POA: Diagnosis present

## 2013-08-26 HISTORY — PX: POSTERIOR CERVICAL FUSION/FORAMINOTOMY: SHX5038

## 2013-08-26 SURGERY — POSTERIOR CERVICAL FUSION/FORAMINOTOMY LEVEL 3
Anesthesia: General | Site: Spine Cervical

## 2013-08-26 MED ORDER — PHENOL 1.4 % MT LIQD: 1.0000 | OROMUCOSAL | Status: DC | PRN

## 2013-08-26 MED ORDER — LIDOCAINE-EPINEPHRINE 1 %-1:100000 IJ SOLN
INTRAMUSCULAR | Status: DC | PRN
  Administered 2013-08-26: 9 mL

## 2013-08-26 MED ORDER — ALUM & MAG HYDROXIDE-SIMETH 200-200-20 MG/5ML PO SUSP: 30.0000 mL | Freq: Four times a day (QID) | ORAL | Status: DC | PRN

## 2013-08-26 MED ORDER — OXYCODONE-ACETAMINOPHEN 5-325 MG PO TABS
1.0000 | ORAL_TABLET | ORAL | Status: DC | PRN
  Administered 2013-08-27 – 2013-08-28 (×4): 1 via ORAL
  Filled 2013-08-26 (×4): qty 1

## 2013-08-26 MED ORDER — DOCUSATE SODIUM 100 MG PO CAPS
100.0000 mg | ORAL_CAPSULE | Freq: Two times a day (BID) | ORAL | Status: DC
  Administered 2013-08-26 – 2013-08-28 (×4): 100 mg via ORAL
  Filled 2013-08-26 (×6): qty 1

## 2013-08-26 MED ORDER — THROMBIN 20000 UNITS EX SOLR
CUTANEOUS | Status: DC | PRN
  Administered 2013-08-26: 09:00:00 via TOPICAL

## 2013-08-26 MED ORDER — FENTANYL CITRATE 0.05 MG/ML IJ SOLN
INTRAMUSCULAR | Status: AC
  Filled 2013-08-26: qty 5

## 2013-08-26 MED ORDER — NEOSTIGMINE METHYLSULFATE 1 MG/ML IJ SOLN
INTRAMUSCULAR | Status: DC | PRN
  Administered 2013-08-26: 5 mg via INTRAVENOUS

## 2013-08-26 MED ORDER — ACETAMINOPHEN 325 MG PO TABS
650.0000 mg | ORAL_TABLET | ORAL | Status: DC | PRN
  Administered 2013-08-27: 650 mg via ORAL
  Filled 2013-08-26: qty 2

## 2013-08-26 MED ORDER — PROPOFOL 10 MG/ML IV BOLUS
INTRAVENOUS | Status: DC | PRN
  Administered 2013-08-26: 50 mg via INTRAVENOUS
  Administered 2013-08-26: 150 mg via INTRAVENOUS

## 2013-08-26 MED ORDER — BUPIVACAINE HCL (PF) 0.25 % IJ SOLN
INTRAMUSCULAR | Status: DC | PRN
  Administered 2013-08-26: 20 mL

## 2013-08-26 MED ORDER — AMLODIPINE BESYLATE 10 MG PO TABS
10.0000 mg | ORAL_TABLET | Freq: Every day | ORAL | Status: DC
  Administered 2013-08-27 – 2013-08-28 (×2): 10 mg via ORAL
  Filled 2013-08-26 (×2): qty 1

## 2013-08-26 MED ORDER — GLYCOPYRROLATE 0.2 MG/ML IJ SOLN
INTRAMUSCULAR | Status: DC | PRN
  Administered 2013-08-26: .8 mg via INTRAVENOUS

## 2013-08-26 MED ORDER — LIDOCAINE HCL (CARDIAC) 20 MG/ML IV SOLN
INTRAVENOUS | Status: DC | PRN
  Administered 2013-08-26: 90 mg via INTRAVENOUS

## 2013-08-26 MED ORDER — VITAMIN D3 25 MCG (1000 UNIT) PO TABS
2000.0000 [IU] | ORAL_TABLET | Freq: Every day | ORAL | Status: DC
  Administered 2013-08-27 – 2013-08-28 (×2): 2000 [IU] via ORAL
  Filled 2013-08-26 (×2): qty 2

## 2013-08-26 MED ORDER — FERROUS SULFATE 325 (65 FE) MG PO TABS
325.0000 mg | ORAL_TABLET | Freq: Every day | ORAL | Status: DC
  Administered 2013-08-27 – 2013-08-28 (×2): 325 mg via ORAL
  Filled 2013-08-26 (×3): qty 1

## 2013-08-26 MED ORDER — 0.9 % SODIUM CHLORIDE (POUR BTL) OPTIME
TOPICAL | Status: DC | PRN
  Administered 2013-08-26: 1000 mL

## 2013-08-26 MED ORDER — ROCURONIUM BROMIDE 50 MG/5ML IV SOLN
INTRAVENOUS | Status: AC
  Filled 2013-08-26: qty 1

## 2013-08-26 MED ORDER — CEFAZOLIN SODIUM-DEXTROSE 2-3 GM-% IV SOLR
INTRAVENOUS | Status: DC | PRN
  Administered 2013-08-26: 2 g via INTRAVENOUS

## 2013-08-26 MED ORDER — MENTHOL 3 MG MT LOZG: 1.0000 | LOZENGE | OROMUCOSAL | Status: DC | PRN

## 2013-08-26 MED ORDER — HYDROMORPHONE HCL PF 1 MG/ML IJ SOLN: 0.2500 mg | INTRAMUSCULAR | Status: DC | PRN

## 2013-08-26 MED ORDER — LACTATED RINGERS IV SOLN
INTRAVENOUS | Status: DC | PRN
  Administered 2013-08-26 (×2): via INTRAVENOUS

## 2013-08-26 MED ORDER — ARTIFICIAL TEARS OP OINT
TOPICAL_OINTMENT | OPHTHALMIC | Status: DC | PRN
  Administered 2013-08-26: 1 via OPHTHALMIC

## 2013-08-26 MED ORDER — ONDANSETRON HCL 4 MG/2ML IJ SOLN
4.0000 mg | INTRAMUSCULAR | Status: DC | PRN
  Administered 2013-08-26: 4 mg via INTRAVENOUS
  Filled 2013-08-26: qty 2

## 2013-08-26 MED ORDER — SODIUM CHLORIDE 0.9 % IR SOLN
Status: DC | PRN
  Administered 2013-08-26: 09:00:00

## 2013-08-26 MED ORDER — CEFAZOLIN SODIUM 1-5 GM-% IV SOLN
1.0000 g | Freq: Three times a day (TID) | INTRAVENOUS | Status: AC
  Administered 2013-08-26 – 2013-08-27 (×2): 1 g via INTRAVENOUS
  Filled 2013-08-26 (×3): qty 50

## 2013-08-26 MED ORDER — CEFAZOLIN SODIUM-DEXTROSE 2-3 GM-% IV SOLR
INTRAVENOUS | Status: AC
  Filled 2013-08-26: qty 50

## 2013-08-26 MED ORDER — PROMETHAZINE HCL 25 MG/ML IJ SOLN: 6.2500 mg | INTRAMUSCULAR | Status: DC | PRN

## 2013-08-26 MED ORDER — OXYCODONE HCL 5 MG PO TABS: 5.0000 mg | ORAL_TABLET | Freq: Once | ORAL | Status: DC | PRN

## 2013-08-26 MED ORDER — SODIUM CHLORIDE 0.9 % IJ SOLN: 3.0000 mL | INTRAMUSCULAR | Status: DC | PRN

## 2013-08-26 MED ORDER — CYCLOBENZAPRINE HCL 10 MG PO TABS
10.0000 mg | ORAL_TABLET | Freq: Three times a day (TID) | ORAL | Status: DC | PRN
  Administered 2013-08-27 – 2013-08-28 (×4): 10 mg via ORAL
  Filled 2013-08-26 (×5): qty 1

## 2013-08-26 MED ORDER — BACITRACIN ZINC 500 UNIT/GM EX OINT
TOPICAL_OINTMENT | CUTANEOUS | Status: DC | PRN
  Administered 2013-08-26: 1 via TOPICAL

## 2013-08-26 MED ORDER — MIDAZOLAM HCL 5 MG/5ML IJ SOLN
INTRAMUSCULAR | Status: DC | PRN
  Administered 2013-08-26: 2 mg via INTRAVENOUS

## 2013-08-26 MED ORDER — HYDROMORPHONE HCL PF 1 MG/ML IJ SOLN
0.5000 mg | INTRAMUSCULAR | Status: DC | PRN
  Administered 2013-08-26 – 2013-08-27 (×4): 1 mg via INTRAVENOUS
  Filled 2013-08-26 (×5): qty 1

## 2013-08-26 MED ORDER — ACETAMINOPHEN 650 MG RE SUPP: 650.0000 mg | RECTAL | Status: DC | PRN

## 2013-08-26 MED ORDER — VITAMIN D 50 MCG (2000 UT) PO TABS: 2000.0000 [IU] | ORAL_TABLET | Freq: Every day | ORAL | Status: DC

## 2013-08-26 MED ORDER — ROCURONIUM BROMIDE 100 MG/10ML IV SOLN
INTRAVENOUS | Status: DC | PRN
  Administered 2013-08-26: 50 mg via INTRAVENOUS
  Administered 2013-08-26: 20 mg via INTRAVENOUS

## 2013-08-26 MED ORDER — SODIUM CHLORIDE 0.9 % IJ SOLN
3.0000 mL | Freq: Two times a day (BID) | INTRAMUSCULAR | Status: DC
  Administered 2013-08-26 – 2013-08-28 (×4): 3 mL via INTRAVENOUS

## 2013-08-26 MED ORDER — OXYCODONE HCL 5 MG/5ML PO SOLN: 5.0000 mg | Freq: Once | ORAL | Status: DC | PRN

## 2013-08-26 MED ORDER — PHENYLEPHRINE HCL 10 MG/ML IJ SOLN
10.0000 mg | INTRAVENOUS | Status: DC | PRN
  Administered 2013-08-26: 25 ug/min via INTRAVENOUS

## 2013-08-26 MED ORDER — FENTANYL CITRATE 0.05 MG/ML IJ SOLN
INTRAMUSCULAR | Status: DC | PRN
  Administered 2013-08-26: 50 ug via INTRAVENOUS
  Administered 2013-08-26: 150 ug via INTRAVENOUS

## 2013-08-26 MED ORDER — PHENYLEPHRINE HCL 10 MG/ML IJ SOLN
INTRAMUSCULAR | Status: AC
  Filled 2013-08-26: qty 1

## 2013-08-26 MED ORDER — GLYCOPYRROLATE 0.2 MG/ML IJ SOLN
INTRAMUSCULAR | Status: AC
  Filled 2013-08-26: qty 1

## 2013-08-26 MED ORDER — ONDANSETRON HCL 4 MG/2ML IJ SOLN
INTRAMUSCULAR | Status: DC | PRN
  Administered 2013-08-26: 4 mg via INTRAVENOUS

## 2013-08-26 SURGICAL SUPPLY — 74 items
ADH SKN CLS APL DERMABOND .7 (GAUZE/BANDAGES/DRESSINGS)
ADH SKN CLS LQ APL DERMABOND (GAUZE/BANDAGES/DRESSINGS) ×1
APL SKNCLS STERI-STRIP NONHPOA (GAUZE/BANDAGES/DRESSINGS) ×1
BAG DECANTER FOR FLEXI CONT (MISCELLANEOUS) ×3 IMPLANT
BENZOIN TINCTURE PRP APPL 2/3 (GAUZE/BANDAGES/DRESSINGS) ×4 IMPLANT
BIT DRILL 2.4XNS REUSE 3.5X (BIT) IMPLANT
BIT DRL 2.4XNS REUSE 3.5X (BIT) ×1
BLADE SURG 11 STRL SS (BLADE) ×1 IMPLANT
BLADE SURG ROTATE 9660 (MISCELLANEOUS) ×3 IMPLANT
BUR MATCHSTICK NEURO 3.0 LAGG (BURR) ×3 IMPLANT
CANISTER SUCT 3000ML (MISCELLANEOUS) ×3 IMPLANT
CAP ELLIPSE LOCKING (Cap) ×14 IMPLANT
CLOSURE WOUND 1/2 X4 (GAUZE/BANDAGES/DRESSINGS) ×2
CONT SPEC 4OZ CLIKSEAL STRL BL (MISCELLANEOUS) ×3 IMPLANT
DECANTER SPIKE VIAL GLASS SM (MISCELLANEOUS) ×3 IMPLANT
DERMABOND ADHESIVE PROPEN (GAUZE/BANDAGES/DRESSINGS) ×2
DERMABOND ADVANCED (GAUZE/BANDAGES/DRESSINGS)
DERMABOND ADVANCED .7 DNX12 (GAUZE/BANDAGES/DRESSINGS) ×1 IMPLANT
DERMABOND ADVANCED .7 DNX6 (GAUZE/BANDAGES/DRESSINGS) IMPLANT
DRAPE C-ARM 42X72 X-RAY (DRAPES) ×4 IMPLANT
DRAPE LAPAROTOMY 100X72 PEDS (DRAPES) ×3 IMPLANT
DRAPE MICROSCOPE ZEISS OPMI (DRAPES) IMPLANT
DRAPE POUCH INSTRU U-SHP 10X18 (DRAPES) ×3 IMPLANT
DRAPE SURG 17X23 STRL (DRAPES) ×6 IMPLANT
DRILL BIT 3.5 (BIT) ×3
DRSG OPSITE 4X5.5 SM (GAUZE/BANDAGES/DRESSINGS) ×6 IMPLANT
DURAPREP 6ML APPLICATOR 50/CS (WOUND CARE) ×3 IMPLANT
ELECT REM PT RETURN 9FT ADLT (ELECTROSURGICAL) ×3
ELECTRODE REM PT RTRN 9FT ADLT (ELECTROSURGICAL) ×1 IMPLANT
EVACUATOR 1/8 PVC DRAIN (DRAIN) ×2 IMPLANT
GAUZE SPONGE 4X4 16PLY XRAY LF (GAUZE/BANDAGES/DRESSINGS) IMPLANT
GLOVE BIO SURGEON STRL SZ8 (GLOVE) ×3 IMPLANT
GLOVE BIOGEL PI IND STRL 7.0 (GLOVE) IMPLANT
GLOVE BIOGEL PI INDICATOR 7.0 (GLOVE) ×6
GLOVE EXAM NITRILE LRG STRL (GLOVE) IMPLANT
GLOVE EXAM NITRILE MD LF STRL (GLOVE) IMPLANT
GLOVE EXAM NITRILE XL STR (GLOVE) IMPLANT
GLOVE EXAM NITRILE XS STR PU (GLOVE) IMPLANT
GLOVE INDICATOR 8.5 STRL (GLOVE) ×3 IMPLANT
GLOVE SS BIOGEL STRL SZ 6.5 (GLOVE) IMPLANT
GLOVE SUPERSENSE BIOGEL SZ 6.5 (GLOVE) ×4
GOWN BRE IMP SLV AUR LG STRL (GOWN DISPOSABLE) ×2 IMPLANT
GOWN BRE IMP SLV AUR XL STRL (GOWN DISPOSABLE) ×3 IMPLANT
GOWN STRL REIN 2XL LVL4 (GOWN DISPOSABLE) IMPLANT
KIT BASIN OR (CUSTOM PROCEDURE TRAY) ×3 IMPLANT
KIT ROOM TURNOVER OR (KITS) ×3 IMPLANT
MARKER SKIN DUAL TIP RULER LAB (MISCELLANEOUS) ×3 IMPLANT
NDL HYPO 25X1 1.5 SAFETY (NEEDLE) ×1 IMPLANT
NDL SPNL 20GX3.5 QUINCKE YW (NEEDLE) ×1 IMPLANT
NEEDLE HYPO 25X1 1.5 SAFETY (NEEDLE) ×3 IMPLANT
NEEDLE SPNL 20GX3.5 QUINCKE YW (NEEDLE) ×3 IMPLANT
NS IRRIG 1000ML POUR BTL (IV SOLUTION) ×3 IMPLANT
PACK LAMINECTOMY NEURO (CUSTOM PROCEDURE TRAY) ×3 IMPLANT
PAD ARMBOARD 7.5X6 YLW CONV (MISCELLANEOUS) ×13 IMPLANT
PIN MAYFIELD SKULL DISP (PIN) ×3 IMPLANT
PUTTY BONE DBX 5CC MIX (Putty) ×2 IMPLANT
ROD ELLIPSE 120MM (Rod) ×2 IMPLANT
RUBBERBAND STERILE (MISCELLANEOUS) ×4 IMPLANT
SCREW ELLIPSE 14X3.5MM (Screw) ×14 IMPLANT
SPONGE GAUZE 4X4 12PLY (GAUZE/BANDAGES/DRESSINGS) ×3 IMPLANT
SPONGE LAP 4X18 X RAY DECT (DISPOSABLE) IMPLANT
SPONGE SURGIFOAM ABS GEL 100 (HEMOSTASIS) ×3 IMPLANT
STRIP BIOACTIVE 5CC 25X50X4MM (Miscellaneous) ×2 IMPLANT
STRIP CLOSURE SKIN 1/2X4 (GAUZE/BANDAGES/DRESSINGS) ×3 IMPLANT
SUT ETHILON 4 0 PS 2 18 (SUTURE) IMPLANT
SUT VIC AB 0 CT1 18XCR BRD8 (SUTURE) ×1 IMPLANT
SUT VIC AB 0 CT1 8-18 (SUTURE) ×3
SUT VIC AB 2-0 CT1 18 (SUTURE) ×3 IMPLANT
SUT VICRYL 4-0 PS2 18IN ABS (SUTURE) ×3 IMPLANT
SYR 20ML ECCENTRIC (SYRINGE) ×3 IMPLANT
TOWEL OR 17X24 6PK STRL BLUE (TOWEL DISPOSABLE) ×3 IMPLANT
TOWEL OR 17X26 10 PK STRL BLUE (TOWEL DISPOSABLE) ×3 IMPLANT
TRAY FOLEY CATH 14FRSI W/METER (CATHETERS) ×2 IMPLANT
WATER STERILE IRR 1000ML POUR (IV SOLUTION) ×3 IMPLANT

## 2013-08-26 NOTE — Op Note (Signed)
Preoperative diagnosis: Pseudoarthrosis C4-5 and C6-7  Postoperative diagnosis: Same  Procedure: Posterior cervical fusion with lateral mass screws at C4-C7 using the globus lateral mass screw system mixed with Kinex and DBX mix  Surgeon: Dominica Severin Harshaan Whang  This anesthesia: Gen.  EBL: Less than 200  History of present illness: Patient is a 58 year female has undergone previous C4-C7 anterior cervical discectomies and fusion and developed pseudoarthroses at C4-5 and C6-7-1 progress worsening neck pain with bilateral radicular symptoms that was refractory to all forms of conservative treatment workup with MRI and CT showed tear incorporation of the interbody bone graft at C4-5 and C6-7 with a solid fusion at C5-6. Due to patient's failure of conservative treatment imaging findings progression of clinical syndrome I recommended posterior cervical fusion from C4-C7 utilizing the globus lateral mass screw system along with DBX mix and Kinex.  Operative procedure: Patient brought into the or was induced under general anesthesia positioned prone in pins the neck in slight flexion the back or neck was shaved prepped and draped in routine sterile fashion preoperative x-ray localize the appropriate level so after infiltration 10 cc lidocaine with epi a midline incision was made and Bovie left car or used to gas in tissues and subperiosteal dissections care on the lamina of C3-4-5 6 and 7. Interoperative x-ray identified 34 disc space so after adequate exposure lateral masses been achieved bilaterally pilot holes were drilled in the inferomedial quadrant and then using a 40 mm screw and guide holes were drilled with a trajectory from the inferomedial to the superior lateral quadrant lateral mass at all 4 levels at C4, C5, C6, and C7 however the lateral mass holes C4 selective traversed the inner cortex so I left the screw out on this level on the right anchored screws at C5-6 and 7 on the right and C4-5-6 and 7 on the  left. Then aggressive irrigation was carried out and aggressive decortication along the lateral masses and facet joints bilaterally DBX mix was packed in the facet joints and then along with Kinex DBX mix was packed along the lateral masses in the facet joints. 2 rods were cut and fashioned and lordosis to notch were applied and torqued down a medium Hemovac drain was placed meticulous hemostasis was maintained and the wounds closed in layers with interrupted Vicryl and the skin was closed with a running 4 subcuticular. Dermabond benzo and Steri-Strips for the were all applied as well as 4 x 4's and OpSite the patient recovered in stable condition. At the end of case on it counts sponge counts were correct.

## 2013-08-26 NOTE — Anesthesia Postprocedure Evaluation (Signed)
Anesthesia Post Note  Patient: [name]  Procedure(s) Performed: Procedure(s) (LRB): C/4-5,C/5-6,C/6-7 Posterior Cervical Fusion w/lateral mass fixation (N/A)  Anesthesia type: general  Patient location: PACU  Post pain: Pain level controlled  Post assessment: Patient's Cardiovascular Status Stable  Last Vitals:  Filed Vitals:   08/26/13 1032  BP:   Pulse: 55  Temp: 36.1 C  Resp: 18    Post vital signs: Reviewed and stable  Level of consciousness: sedated  Complications: No apparent anesthesia complications

## 2013-08-26 NOTE — Transfer of Care (Signed)
Immediate Anesthesia Transfer of Care Note  Patient: [name]  Procedure(s) Performed: Procedure(s): C/4-5,C/5-6,C/6-7 Posterior Cervical Fusion w/lateral mass fixation (N/A)  Patient Location: PACU  Anesthesia Type:General  Level of Consciousness: awake, alert  and oriented  Airway & Oxygen Therapy: Patient Spontanous Breathing and Patient connected to nasal cannula oxygen  Post-op Assessment: Report given to PACU RN, Post -op Vital signs reviewed and stable and Patient moving all extremities X 4  Post vital signs: Reviewed and stable  Complications: No apparent anesthesia complications

## 2013-08-26 NOTE — H&P (Signed)
[name] is an 58 y.o. female.   Chief Complaint: Neck pain right greater left arm pain HPI: Patient is 58 year female who undergone previous anterior cervical discectomy and fusion from C4-C7 initially did very well however last weeks and months is a progress worsening neck pain with occasional pain it radiates down her arm with numbness and tingling in the thumb and first 2 fingers. Workup has revealed pseudoarthroses at C4-5 and C6-7 with a solid fusion at C5-6 and due to patient's failure conservative treatment imaging findings and the fact the patient is almost a year out from her anterior cervical fusion I have recommended posterior cervical augmentation with lateral mass screws at C4-C7 have extensively reviewed the risks and benefits of the operation with the patient as well as perioperative course and expectations of outcome and alternatives of surgery she understands and agrees to proceed forward.  Past Medical History  Diagnosis Date  . Hypertension     takes Amlodipine daily  . Anemia     takes Ferrous Sulfate daily  . Arthritis   . Low back pain     occasionally  . GERD (gastroesophageal reflux disease)     takes OTC med    Past Surgical History  Procedure Laterality Date  . Neck surgery    . Tubal ligation    . Anterior cervical decomp/discectomy fusion  10/19/2011    Procedure: ANTERIOR CERVICAL DECOMPRESSION/DISCECTOMY FUSION 1 LEVEL/HARDWARE REMOVAL;  Surgeon: Elaina Hoops, MD;  Location: [facility];  Service: Neurosurgery;  Laterality: N/A;  Cervical four - five  Anterior cervical decompression fusion with redo at six - seven  Rm 33    History reviewed. No pertinent family history. Social History:  reports that she has never smoked. She does not have any smokeless tobacco history on file. She reports that she does not drink alcohol or use illicit drugs.  Allergies:  Allergies  Allergen Reactions  . Aspirin Nausea Only  . Sulfa Antibiotics Hives and Nausea  Only    Medications Prior to Admission  Medication Sig Dispense Refill  . amLODipine (NORVASC) 10 MG tablet Take 10 mg by mouth daily.      . Cholecalciferol (VITAMIN D) 2000 UNITS tablet Take 2,000 Units by mouth daily.      . ferrous sulfate 325 (65 FE) MG tablet Take 325 mg by mouth daily with breakfast.      . Omega-3 Fatty Acids (FISH OIL) 1200 MG CAPS Take 1 capsule by mouth daily.      Marland Kitchen OVER THE COUNTER MEDICATION Take 1 tablet by mouth daily as needed. Gas Relief - for indigestion      . OVER THE COUNTER MEDICATION Take 1 tablet by mouth daily. [employer] brand allergy relief        No results found for this or any previous visit (from the past 48 hour(s)). No results found.  Review of Systems  Constitutional: Negative.   Eyes: Negative.   Respiratory: Negative.   Cardiovascular: Negative.   Gastrointestinal: Negative.   Genitourinary: Negative.   Musculoskeletal: Positive for myalgias and neck pain.  Skin: Negative.   Endo/Heme/Allergies: Negative.   Psychiatric/Behavioral: Negative.     Blood pressure 136/70, pulse 58, temperature 97.8 F (36.6 C), temperature source Oral, SpO2 98.00%. Physical Exam  Constitutional: She is oriented to person, place, and time. She appears well-developed and well-nourished.  HENT:  Head: Normocephalic.  Eyes: Pupils are equal, round, and reactive to light.  Neck: Normal range of motion.  Respiratory: Effort normal and breath sounds normal.  GI: Soft. Bowel sounds are normal.  Neurological: She is alert and oriented to person, place, and time. She has normal strength. GCS eye subscore is 4. GCS verbal subscore is 5. GCS motor subscore is 6.  Reflex Scores:      Tricep reflexes are 2+ on the right side and 2+ on the left side.      Bicep reflexes are 2+ on the right side and 2+ on the left side.      Brachioradialis reflexes are 2+ on the right side and 2+ on the left side.      Patellar reflexes are 2+ on the right side and 2+ on the  left side.      Achilles reflexes are 2+ on the right side and 2+ on the left side. Strength is 5 out of 5 in her deltoids, biceps, triceps, wrist flexion, wrist extension, and intrinsics.  Skin: Skin is warm and dry.     Assessment/Plan 58 year female presents for posterior cervical fusion from C4-C7  Lorae Roig P 08/26/2013, 7:15 AM

## 2013-08-26 NOTE — Progress Notes (Signed)
Utilization review completed.  

## 2013-08-26 NOTE — Progress Notes (Signed)
Orthopedic Tech Progress Note Patient Details:  [name] 09-24-1955 [id number]  Ortho Devices Type of Ortho Device: Soft collar Ortho Device/Splint Interventions: Application   Cammer, Theodoro Parma 08/26/2013, 10:08 AM

## 2013-08-26 NOTE — Plan of Care (Signed)
Problem: Consults Goal: Diagnosis - Spinal Surgery Outcome: Completed/Met Date Met:  08/26/13 Cervical Spine Fusion

## 2013-08-26 NOTE — Anesthesia Preprocedure Evaluation (Addendum)
Anesthesia Evaluation  Patient identified by MRN, date of birth, ID band Patient awake    Reviewed: Allergy & Precautions, H&P , NPO status , Patient's Chart, lab work & pertinent test results  History of Anesthesia Complications Negative for: history of anesthetic complications  Airway Mallampati: II TM Distance: >3 FB Neck ROM: Full    Dental  (+) Teeth Intact and Dental Advisory Given   Pulmonary neg pulmonary ROS,    Pulmonary exam normal       Cardiovascular hypertension, Pt. on medications     Neuro/Psych negative neurological ROS  negative psych ROS   GI/Hepatic Neg liver ROS, GERD-  Medicated,  Endo/Other  negative endocrine ROS  Renal/GU negative Renal ROS     Musculoskeletal negative musculoskeletal ROS (+)   Abdominal   Peds  Hematology  (+) anemia ,   Anesthesia Other Findings   Reproductive/Obstetrics                        Anesthesia Physical Anesthesia Plan  ASA: II  Anesthesia Plan: General   Post-op Pain Management:    Induction: Intravenous  Airway Management Planned: Oral ETT  Additional Equipment:   Intra-op Plan:   Post-operative Plan: Extubation in OR  Informed Consent: I have reviewed the patients History and Physical, chart, labs and discussed the procedure including the risks, benefits and alternatives for the proposed anesthesia with the patient or authorized representative who has indicated his/her understanding and acceptance.   Dental advisory given  Plan Discussed with: CRNA, Anesthesiologist and Surgeon  Anesthesia Plan Comments:        Anesthesia Quick Evaluation

## 2013-08-26 NOTE — Anesthesia Procedure Notes (Signed)
Procedure Name: Intubation Date/Time: 08/26/2013 7:28 AM Performed by: Gaylene Brooks Pre-anesthesia Checklist: Patient identified, Timeout performed, Emergency Drugs available, Suction available and Patient being monitored Patient Re-evaluated:Patient Re-evaluated prior to inductionOxygen Delivery Method: Circle system utilized Preoxygenation: Pre-oxygenation with 100% oxygen Intubation Type: IV induction Ventilation: Mask ventilation without difficulty Laryngoscope Size: Miller and 2 Grade View: Grade I Tube type: Oral Tube size: 7.0 mm Number of attempts: 1 Airway Equipment and Method: Stylet Placement Confirmation: ETT inserted through vocal cords under direct vision,  breath sounds checked- equal and bilateral,  positive ETCO2 and CO2 detector Secured at: 22 cm Tube secured with: Tape Dental Injury: Teeth and Oropharynx as per pre-operative assessment

## 2013-08-27 ENCOUNTER — Encounter (HOSPITAL_COMMUNITY): Payer: Self-pay | Admitting: Neurosurgery

## 2013-08-27 NOTE — Progress Notes (Signed)
Subjective: Patient reports Overall she's feeling well conditioned neck pain but no arm pain medication does seem to be taking the edge off  Objective: Vital signs in last 24 hours: Temp:  [96.8 F (36 C)-99 F (37.2 C)] 98.5 F (36.9 C) (02/03 0511) Pulse Rate:  [50-78] 73 (02/03 0511) Resp:  [16-20] 16 (02/03 0511) BP: (119-152)/(57-79) 141/68 mmHg (02/03 0511) SpO2:  [90 %-96 %] 94 % (02/03 0511)  Intake/Output from previous day: 02/02 0701 - 02/03 0700 In: 1880 [P.O.:480; I.V.:1300; IV Piggyback:100] Out: 285 [Urine:245; Drains:40] Intake/Output this shift:    Awake alert oriented strength out of 5 wound clean and dry  Lab Results: No results found for this basename: WBC, HGB, HCT, PLT,  in the last 72 hours BMET No results found for this basename: NA, K, CL, CO2, GLUCOSE, BUN, CREATININE, CALCIUM,  in the last 72 hours  Studies/Results: Dg Cervical Spine 2-3 Views  08/26/2013   CLINICAL DATA:  Posterior spinal fusion  EXAM: CERVICAL SPINE - 2-3 VIEW; DG C-ARM 1-60 MIN  COMPARISON:  None  FLUOROSCOPY TIME:  19 second  FINDINGS: Three intraoperative fluoroscopic spot images. There is posterior spinal fusion from C4 through C7. There is anterior cervical disc fusion performed previously from C4 through C7.  There is an endotracheal tube and nasogastric tube present.  IMPRESSION: Interval posterior spinal fusion from C4 through C7.   Electronically Signed   By: Kathreen Devoid   On: 08/26/2013 14:02   Dg C-arm 1-60 Min  08/26/2013   CLINICAL DATA:  Posterior spinal fusion  EXAM: CERVICAL SPINE - 2-3 VIEW; DG C-ARM 1-60 MIN  COMPARISON:  None  FLUOROSCOPY TIME:  19 second  FINDINGS: Three intraoperative fluoroscopic spot images. There is posterior spinal fusion from C4 through C7. There is anterior cervical disc fusion performed previously from C4 through C7.  There is an endotracheal tube and nasogastric tube present.  IMPRESSION: Interval posterior spinal fusion from C4 through C7.    Electronically Signed   By: Kathreen Devoid   On: 08/26/2013 14:02    Assessment/Plan: Progressive mobilization today we'll get a physical therapy work with her continue 1 more day to work on pain management  LOS: 1 day     [place] 08/27/2013, 7:27 AM

## 2013-08-27 NOTE — Evaluation (Signed)
Physical Therapy Evaluation and Discharge Patient Details Name: [name] MRN: [id number] DOB: 11-12-1955 Today's Date: 08/27/2013 Time: 2585-2778 PT Time Calculation (min): 20 min  PT Assessment / Plan / Recommendation History of Present Illness  pt admitted for elective Posterior cervical fusion with lateral mass screws at C4-C7   Clinical Impression  Pt functioning at supervision level. This is patients 3rd cervical surgery and is aware of precautions. Pt safe to d/c home with 24/7 supervision which daughter reports she can provide. Pt with no further acute skilled PT needs at this time. PT signing off. Please re-consult if need in future.    PT Assessment  Patent does not need any further PT services    Follow Up Recommendations  No PT follow up;Supervision/Assistance - 24 hour    Does the patient have the potential to tolerate intense rehabilitation      Barriers to Discharge        Equipment Recommendations  None recommended by PT    Recommendations for Other Services     Frequency      Precautions / Restrictions Precautions Precautions: Fall;Cervical Precaution Comments: handout provided and pt/daughter educated Restrictions Weight Bearing Restrictions: No   Pertinent Vitals/Pain 6/10 cervical pain - RN provided tylenol      Mobility  Bed Mobility Overal bed mobility: Needs Assistance Bed Mobility: Rolling;Sidelying to Sit;Sit to Sidelying Rolling: Supervision Sidelying to sit: Supervision Sit to sidelying: Supervision General bed mobility comments: v/c's for logroll technique Transfers Overall transfer level: Needs assistance Transfers: Sit to/from Stand Sit to Stand: Supervision General transfer comment: pt with safe technique Ambulation/Gait Ambulation/Gait assistance: Supervision Ambulation Distance (Feet): 150 Feet Assistive device: None Gait Pattern/deviations: Step-through pattern General Gait Details: no episodes of LOB Stairs:  Yes Stairs assistance: Supervision Stair Management: Two rails Number of Stairs: 6 General stair comments: pt guarded but steady    Exercises     PT Diagnosis:    PT Problem List:   PT Treatment Interventions:       PT Goals(Current goals can be found in the care plan section) Acute Rehab PT Goals Patient Stated Goal: home PT Goal Formulation: No goals set, d/c therapy  Visit Information  Last PT Received On: 08/27/13 Assistance Needed: +1 History of Present Illness: pt admitted for elective Posterior cervical fusion with lateral mass screws at C4-C7        Prior [place] expects to be discharged to:: Private residence Living Arrangements: Children Available Help at Discharge: Family;Available 24 hours/day Type of Home: House Home Access: Stairs to enter [employer] of Steps: 3 Entrance Stairs-Rails: Can reach both Home Layout: One level Home Equipment: Shower seat Prior Function Level of Independence: Independent Communication Communication: No difficulties Dominant Hand: Right    Cognition  Cognition Arousal/Alertness: Awake/alert Behavior During Therapy: WFL for tasks assessed/performed Overall Cognitive Status: Within Functional Limits for tasks assessed    Extremity/Trunk Assessment Upper Extremity Assessment Upper Extremity Assessment: Generalized weakness Lower Extremity Assessment Lower Extremity Assessment: Overall WFL for tasks assessed Cervical / Trunk Assessment Cervical / Trunk Assessment: Other exceptions (recent neck surgery)   Balance Balance Overall balance assessment: No apparent balance deficits (not formally assessed)  End of Session PT - End of Session Equipment Utilized During Treatment: Gait belt Activity Tolerance: Patient tolerated treatment well Patient left: in bed;with call bell/phone within reach;with family/visitor present Nurse Communication: Mobility status;Patient requests pain meds   GP     Charlotta Lapaglia, Knute Neu 08/27/2013, 2:39 PM  Kittie Plater, PT, DPT Pager #: [phone] Office #: [phone]

## 2013-08-28 ENCOUNTER — Encounter (HOSPITAL_COMMUNITY): Payer: Self-pay | Admitting: General Practice

## 2013-08-28 MED ORDER — CYCLOBENZAPRINE HCL 10 MG PO TABS: 10.0000 mg | ORAL_TABLET | Freq: Three times a day (TID) | ORAL | Status: DC | PRN

## 2013-08-28 MED ORDER — OXYCODONE-ACETAMINOPHEN 5-325 MG PO TABS: 1.0000 | ORAL_TABLET | ORAL | Status: DC | PRN

## 2013-08-28 NOTE — Progress Notes (Signed)
Pt. DC'd home via car with family.  DC instructions and prescriptions given to patient.  Vital signs and assessments were stable.  

## 2013-08-28 NOTE — Discharge Instructions (Signed)
No lifting no bending no twisting no driving °

## 2013-08-28 NOTE — Care Management Note (Signed)
    Page 1 of 1   08/28/2013     11:32:24 AM   CARE MANAGEMENT NOTE 08/28/2013  Patient:  [name], [name]   Account Number:  [id number]  Date Initiated:  08/28/2013  Documentation initiated by:  Lorne Skeens  Subjective/Objective Assessment:   patient admitted for a PCDF with screw placement. Lives at home, private residence.     Action/Plan:   Will follow for discharge needs, pending PT/OT recommendations and physician approval   Anticipated DC Date:  08/28/2013   Anticipated DC Plan:  [place]  CM consult      Choice offered to / List presented to:             Status of service:  Completed, signed off Medicare Important Message given?   (If response is "NO", the following Medicare IM given date fields will be blank) Date Medicare IM given:   Date Additional Medicare IM given:    Discharge Disposition:  HOME/SELF CARE  Per UR Regulation:  Reviewed for med. necessity/level of care/duration of stay  If discussed at [place] of Stay Meetings, dates discussed:    Comments:  08/28/13 Lorne Skeens RN, MSN, CM- No home health needs identified.

## 2013-08-28 NOTE — Discharge Summary (Signed)
  Physician Discharge Summary  Patient ID: LERA GAINES MRN: [id number] DOB/AGE: 1955/12/07 58 y.o.  Admit date: 08/26/2013 Discharge date: 08/28/2013  Admission Diagnoses: Pseudoarthrosis C4-5 C6-7  Discharge Diagnoses: Same Active Problems:   Pseudoarthrosis of cervical spine   Discharged Condition: good  Hospital Course: His is been off the posterior cervical fusion from C4-C7 postoperatively patient did very well with recovered in the floor on the floor she was ambulating and voiding spontaneously tolerating regular diet wound is clean and dry and was stable for discharge posterior day 2.  Consults: Significant Diagnostic Studies: Treatments: Posterior cervical fusion C4-C7 Discharge Exam: Blood pressure 113/63, pulse 80, temperature 98.5 F (36.9 C), temperature source Oral, resp. rate 18, SpO2 93.00%. Strength out of 5 wound clean and dry  Disposition: Home     Medication List         amLODipine 10 MG tablet  Commonly known as:  NORVASC  Take 10 mg by mouth daily.     cyclobenzaprine 10 MG tablet  Commonly known as:  FLEXERIL  Take 1 tablet (10 mg total) by mouth 3 (three) times daily as needed for muscle spasms.     ferrous sulfate 325 (65 FE) MG tablet  Take 325 mg by mouth daily with breakfast.     Fish Oil 1200 MG Caps  Take 1 capsule by mouth daily.     OVER THE COUNTER MEDICATION  Take 1 tablet by mouth daily. [employer] brand allergy relief     OVER THE COUNTER MEDICATION  Take 1 tablet by mouth daily as needed. Gas Relief - for indigestion     oxyCODONE-acetaminophen 5-325 MG per tablet  Commonly known as:  PERCOCET/ROXICET  Take 1-2 tablets by mouth every 4 (four) hours as needed for moderate pain.     Vitamin D 2000 UNITS tablet  Take 2,000 Units by mouth daily.           Follow-up Information   Follow up with [facility], MD.   Specialty:  Neurosurgery   Contact information:   [address] [place]  [address] [phone]       Signed: Yareni Creps P 08/28/2013, 11:22 AM

## 2013-08-28 NOTE — Progress Notes (Signed)
Patient ID: [name], female   DOB: 1955-12-30, 58 y.o.   MRN: [id number] She's doing well no arm pain neck pain well-controlled plan discharge home

## 2014-04-23 ENCOUNTER — Encounter: Payer: Self-pay | Admitting: Neurosurgery

## 2014-04-24 ENCOUNTER — Encounter: Payer: Self-pay | Admitting: Neurosurgery

## 2014-05-25 ENCOUNTER — Encounter: Payer: Self-pay | Admitting: Neurosurgery

## 2014-07-08 LAB — LIPID PANEL
Cholesterol: 235 mg/dL — AB (ref 0–200)
HDL: 55 mg/dL (ref 35–70)
LDL Cholesterol: 160 mg/dL
LDl/HDL Ratio: 2.9
Triglycerides: 102 mg/dL (ref 40–160)

## 2014-07-08 LAB — HEPATIC FUNCTION PANEL
ALT: 7 U/L (ref 7–35)
AST: 11 U/L — AB (ref 13–35)

## 2014-07-08 LAB — CBC AND DIFFERENTIAL
HCT: 44 % (ref 36–46)
Hemoglobin: 14.6 g/dL (ref 12.0–16.0)
PLATELETS: 348 10*3/uL (ref 150–399)
WBC: 7 10^3/mL

## 2014-07-08 LAB — BASIC METABOLIC PANEL
BUN: 8 mg/dL (ref 4–21)
Creatinine: 0.7 mg/dL (ref 0.5–1.1)
GLUCOSE: 92 mg/dL

## 2014-07-30 DIAGNOSIS — M5116 Intervertebral disc disorders with radiculopathy, lumbar region: Secondary | ICD-10-CM | POA: Diagnosis not present

## 2014-08-12 DIAGNOSIS — I10 Essential (primary) hypertension: Secondary | ICD-10-CM | POA: Diagnosis not present

## 2014-08-12 DIAGNOSIS — Z1389 Encounter for screening for other disorder: Secondary | ICD-10-CM | POA: Diagnosis not present

## 2014-08-12 DIAGNOSIS — E559 Vitamin D deficiency, unspecified: Secondary | ICD-10-CM | POA: Diagnosis not present

## 2014-08-12 DIAGNOSIS — E780 Pure hypercholesterolemia: Secondary | ICD-10-CM | POA: Diagnosis not present

## 2014-08-12 DIAGNOSIS — R51 Headache: Secondary | ICD-10-CM | POA: Diagnosis not present

## 2014-08-25 HISTORY — PX: COLONOSCOPY: SHX174

## 2014-09-05 DIAGNOSIS — I10 Essential (primary) hypertension: Secondary | ICD-10-CM | POA: Diagnosis not present

## 2014-09-05 DIAGNOSIS — Z0000 Encounter for general adult medical examination without abnormal findings: Secondary | ICD-10-CM | POA: Diagnosis not present

## 2014-09-05 DIAGNOSIS — E559 Vitamin D deficiency, unspecified: Secondary | ICD-10-CM | POA: Diagnosis not present

## 2014-09-05 DIAGNOSIS — Z1389 Encounter for screening for other disorder: Secondary | ICD-10-CM | POA: Diagnosis not present

## 2014-09-05 DIAGNOSIS — E780 Pure hypercholesterolemia: Secondary | ICD-10-CM | POA: Diagnosis not present

## 2014-09-15 ENCOUNTER — Other Ambulatory Visit: Payer: Self-pay | Admitting: Family Medicine

## 2014-09-15 DIAGNOSIS — E780 Pure hypercholesterolemia: Secondary | ICD-10-CM | POA: Diagnosis not present

## 2014-09-18 ENCOUNTER — Emergency Department: Payer: Self-pay | Admitting: Student

## 2014-09-18 DIAGNOSIS — R509 Fever, unspecified: Secondary | ICD-10-CM | POA: Diagnosis not present

## 2014-09-18 DIAGNOSIS — R197 Diarrhea, unspecified: Secondary | ICD-10-CM | POA: Diagnosis not present

## 2014-09-18 DIAGNOSIS — I10 Essential (primary) hypertension: Secondary | ICD-10-CM | POA: Diagnosis not present

## 2014-09-18 DIAGNOSIS — R072 Precordial pain: Secondary | ICD-10-CM | POA: Diagnosis not present

## 2014-09-18 DIAGNOSIS — Z79899 Other long term (current) drug therapy: Secondary | ICD-10-CM | POA: Diagnosis not present

## 2014-09-18 DIAGNOSIS — R1084 Generalized abdominal pain: Secondary | ICD-10-CM | POA: Diagnosis not present

## 2014-09-18 DIAGNOSIS — R42 Dizziness and giddiness: Secondary | ICD-10-CM | POA: Diagnosis not present

## 2014-09-18 DIAGNOSIS — R079 Chest pain, unspecified: Secondary | ICD-10-CM | POA: Diagnosis not present

## 2014-09-18 DIAGNOSIS — R531 Weakness: Secondary | ICD-10-CM | POA: Diagnosis not present

## 2014-09-18 DIAGNOSIS — K7689 Other specified diseases of liver: Secondary | ICD-10-CM | POA: Diagnosis not present

## 2014-09-18 DIAGNOSIS — R110 Nausea: Secondary | ICD-10-CM | POA: Diagnosis not present

## 2014-09-18 DIAGNOSIS — R1013 Epigastric pain: Secondary | ICD-10-CM | POA: Diagnosis not present

## 2014-09-26 DIAGNOSIS — E780 Pure hypercholesterolemia: Secondary | ICD-10-CM | POA: Diagnosis not present

## 2014-09-26 DIAGNOSIS — K219 Gastro-esophageal reflux disease without esophagitis: Secondary | ICD-10-CM | POA: Diagnosis not present

## 2014-09-26 DIAGNOSIS — E559 Vitamin D deficiency, unspecified: Secondary | ICD-10-CM | POA: Diagnosis not present

## 2014-09-26 DIAGNOSIS — Z1389 Encounter for screening for other disorder: Secondary | ICD-10-CM | POA: Diagnosis not present

## 2014-09-30 DIAGNOSIS — M5126 Other intervertebral disc displacement, lumbar region: Secondary | ICD-10-CM | POA: Diagnosis not present

## 2014-09-30 DIAGNOSIS — M5137 Other intervertebral disc degeneration, lumbosacral region: Secondary | ICD-10-CM | POA: Diagnosis not present

## 2014-09-30 DIAGNOSIS — Z6827 Body mass index (BMI) 27.0-27.9, adult: Secondary | ICD-10-CM | POA: Diagnosis not present

## 2014-10-15 DIAGNOSIS — M5137 Other intervertebral disc degeneration, lumbosacral region: Secondary | ICD-10-CM | POA: Diagnosis not present

## 2014-10-15 DIAGNOSIS — M5126 Other intervertebral disc displacement, lumbar region: Secondary | ICD-10-CM | POA: Diagnosis not present

## 2014-10-15 DIAGNOSIS — M5417 Radiculopathy, lumbosacral region: Secondary | ICD-10-CM | POA: Diagnosis not present

## 2014-10-28 ENCOUNTER — Ambulatory Visit
Admit: 2014-10-28 | Disposition: A | Discharge status: Home / Self Care | Payer: Self-pay | Attending: Gastroenterology | Admitting: Gastroenterology

## 2014-10-28 DIAGNOSIS — K621 Rectal polyp: Secondary | ICD-10-CM | POA: Diagnosis not present

## 2014-10-28 DIAGNOSIS — Z886 Allergy status to analgesic agent status: Secondary | ICD-10-CM | POA: Diagnosis not present

## 2014-10-28 DIAGNOSIS — Z9889 Other specified postprocedural states: Secondary | ICD-10-CM | POA: Diagnosis not present

## 2014-10-28 DIAGNOSIS — D128 Benign neoplasm of rectum: Secondary | ICD-10-CM | POA: Diagnosis not present

## 2014-10-28 DIAGNOSIS — Z79899 Other long term (current) drug therapy: Secondary | ICD-10-CM | POA: Diagnosis not present

## 2014-10-28 DIAGNOSIS — Z882 Allergy status to sulfonamides status: Secondary | ICD-10-CM | POA: Diagnosis not present

## 2014-10-28 DIAGNOSIS — I10 Essential (primary) hypertension: Secondary | ICD-10-CM | POA: Diagnosis not present

## 2014-10-28 DIAGNOSIS — Z888 Allergy status to other drugs, medicaments and biological substances status: Secondary | ICD-10-CM | POA: Diagnosis not present

## 2014-10-28 DIAGNOSIS — R12 Heartburn: Secondary | ICD-10-CM | POA: Diagnosis not present

## 2014-10-28 DIAGNOSIS — Z1211 Encounter for screening for malignant neoplasm of colon: Secondary | ICD-10-CM | POA: Diagnosis not present

## 2014-10-28 LAB — HM COLONOSCOPY

## 2014-11-17 LAB — SURGICAL PATHOLOGY

## 2014-11-25 DIAGNOSIS — Z6827 Body mass index (BMI) 27.0-27.9, adult: Secondary | ICD-10-CM | POA: Diagnosis not present

## 2014-11-25 DIAGNOSIS — M25551 Pain in right hip: Secondary | ICD-10-CM | POA: Diagnosis not present

## 2014-12-24 DIAGNOSIS — M25551 Pain in right hip: Secondary | ICD-10-CM | POA: Diagnosis not present

## 2014-12-24 DIAGNOSIS — M1611 Unilateral primary osteoarthritis, right hip: Secondary | ICD-10-CM | POA: Diagnosis not present

## 2014-12-25 DIAGNOSIS — M25551 Pain in right hip: Secondary | ICD-10-CM | POA: Diagnosis not present

## 2014-12-25 DIAGNOSIS — M5137 Other intervertebral disc degeneration, lumbosacral region: Secondary | ICD-10-CM | POA: Diagnosis not present

## 2015-01-06 DIAGNOSIS — K219 Gastro-esophageal reflux disease without esophagitis: Secondary | ICD-10-CM | POA: Insufficient documentation

## 2015-01-06 DIAGNOSIS — G4486 Cervicogenic headache: Secondary | ICD-10-CM | POA: Insufficient documentation

## 2015-01-06 DIAGNOSIS — D649 Anemia, unspecified: Secondary | ICD-10-CM | POA: Insufficient documentation

## 2015-01-06 DIAGNOSIS — R51 Headache: Secondary | ICD-10-CM

## 2015-01-06 DIAGNOSIS — H8309 Labyrinthitis, unspecified ear: Secondary | ICD-10-CM | POA: Insufficient documentation

## 2015-01-06 DIAGNOSIS — M545 Low back pain, unspecified: Secondary | ICD-10-CM | POA: Insufficient documentation

## 2015-01-06 DIAGNOSIS — K5792 Diverticulitis of intestine, part unspecified, without perforation or abscess without bleeding: Secondary | ICD-10-CM | POA: Insufficient documentation

## 2015-01-06 DIAGNOSIS — M5090 Cervical disc disorder, unspecified, unspecified cervical region: Secondary | ICD-10-CM | POA: Insufficient documentation

## 2015-01-06 DIAGNOSIS — L299 Pruritus, unspecified: Secondary | ICD-10-CM | POA: Insufficient documentation

## 2015-01-06 DIAGNOSIS — E875 Hyperkalemia: Secondary | ICD-10-CM | POA: Insufficient documentation

## 2015-01-06 DIAGNOSIS — Z6828 Body mass index (BMI) 28.0-28.9, adult: Secondary | ICD-10-CM | POA: Insufficient documentation

## 2015-01-06 DIAGNOSIS — R0789 Other chest pain: Secondary | ICD-10-CM | POA: Insufficient documentation

## 2015-01-06 DIAGNOSIS — K5900 Constipation, unspecified: Secondary | ICD-10-CM | POA: Insufficient documentation

## 2015-01-06 DIAGNOSIS — E7800 Pure hypercholesterolemia, unspecified: Secondary | ICD-10-CM | POA: Insufficient documentation

## 2015-01-07 ENCOUNTER — Ambulatory Visit (INDEPENDENT_AMBULATORY_CARE_PROVIDER_SITE_OTHER): Payer: Commercial Managed Care - HMO | Admitting: Family Medicine

## 2015-01-07 ENCOUNTER — Encounter: Payer: Self-pay | Admitting: Family Medicine

## 2015-01-07 VITALS — BP 124/70 | HR 72 | Temp 98.6°F | Resp 16 | Ht 65.0 in | Wt 168.0 lb

## 2015-01-07 DIAGNOSIS — M25551 Pain in right hip: Secondary | ICD-10-CM | POA: Diagnosis not present

## 2015-01-07 DIAGNOSIS — M545 Low back pain, unspecified: Secondary | ICD-10-CM | POA: Insufficient documentation

## 2015-01-07 NOTE — Progress Notes (Signed)
Subjective:    Patient ID: [name], female    DOB: [birth year], 59 y.o.   MRN: [id number]  HPI Comments: Pt is requesting referral to ortho.  Hip Pain  The incident occurred more than 1 week ago (x 2 months). There was no injury mechanism (arthritis). The pain is present in the right hip. The quality of the pain is described as aching (pt reports her hip will "give out sometimes"). The pain is at a severity of 8/10. The pain is moderate (can be severe). The pain has been intermittent since onset. Associated symptoms include an inability to bear weight (sometimes), a loss of motion and a loss of sensation. Pertinent negatives include no muscle weakness, numbness or tingling. The symptoms are aggravated by weight bearing. She has tried NSAIDs and heat for the symptoms. The treatment provided mild relief.   Dr. Saintclair Halsted recommended she see Dr. Tamala Julian.     Review of Systems  Constitutional: Negative.   Respiratory: Negative.   Cardiovascular: Negative.   Neurological: Negative for tingling and numbness.   BP 124/70 mmHg  Pulse 72  Temp(Src) 98.6 F (37 C) (Oral)  Resp 16  Ht 5\' 5"  (1.651 m)  Wt 168 lb (76.204 kg)  BMI 27.96 kg/m2   Patient Active Problem List   Diagnosis Date Noted  . Absolute anemia 01/06/2015  . Body mass index (BMI) of 28.0-28.9 in adult 01/06/2015  . Cervical disc disease 01/06/2015  . Atypical chest pain 01/06/2015  . Acute constipation 01/06/2015  . Diverticulitis 01/06/2015  . Generalized pruritus 01/06/2015  . Acid reflux 01/06/2015  . Cervicogenic headache 01/06/2015  . Hypercholesteremia 01/06/2015  . High potassium 01/06/2015  . Acute labyrinthitis 01/06/2015  . LBP (low back pain) 01/06/2015  . Pseudoarthrosis of cervical spine 08/26/2013  . Accumulation of fluid in tissues 12/24/2009  . Avitaminosis D 10/15/2009  . Cardiac conduction disorder 09/30/2008  . Essential (primary) hypertension 09/30/2008  . Blood in the urine 09/30/2008   Past  Medical History  Diagnosis Date  . Hypertension     takes Amlodipine daily  . Anemia     takes Ferrous Sulfate daily  . Arthritis   . Low back pain     occasionally  . GERD (gastroesophageal reflux disease)     takes OTC med  . Allergy   . Hyperlipidemia    Current Outpatient Prescriptions on File Prior to Visit  Medication Sig  . amLODipine (NORVASC) 10 MG tablet Take 10 mg by mouth daily.  . Cholecalciferol (VITAMIN D) 2000 UNITS tablet Take 2,000 Units by mouth daily.  . cyclobenzaprine (FLEXERIL) 10 MG tablet Take 1 tablet (10 mg total) by mouth 3 (three) times daily as needed for muscle spasms. (Patient not taking: Reported on 01/07/2015)  . dexlansoprazole (DEXILANT) 60 MG capsule Take by mouth.  . ferrous sulfate 325 (65 FE) MG tablet Take 325 mg by mouth daily with breakfast.  . Omega-3 Fatty Acids (FISH OIL) 1200 MG CAPS Take 1 capsule by mouth daily.  Marland Kitchen OVER THE COUNTER MEDICATION Take 1 tablet by mouth daily as needed. Gas Relief - for indigestion  . OVER THE COUNTER MEDICATION Take 1 tablet by mouth daily. [employer] brand allergy relief  . oxyCODONE-acetaminophen (PERCOCET/ROXICET) 5-325 MG per tablet Take 1-2 tablets by mouth every 4 (four) hours as needed for moderate pain. (Patient not taking: Reported on 01/07/2015)  . Polyethylene Glycol 3350 POWD Take by mouth.  . potassium chloride (K-DUR) 10 MEQ tablet Take by mouth.  Marland Kitchen  senna (SENOKOT) 8.6 MG tablet Take by mouth.  . Simethicone (GAS RELIEF ULTRA STRENGTH) 180 MG CAPS Take by mouth.  . simvastatin (ZOCOR) 20 MG tablet Take by mouth.   No current facility-administered medications on file prior to visit.   Allergies  Allergen Reactions  . Aspirin Nausea Only  . Hydrochlorothiazide     Rash  . Lisinopril     Other reaction(s): Unknown  . Sulfa Antibiotics Hives and Nausea Only   Past Surgical History  Procedure Laterality Date  . Neck surgery    . Tubal ligation    . Anterior cervical decomp/discectomy  fusion  10/19/2011    Procedure: ANTERIOR CERVICAL DECOMPRESSION/DISCECTOMY FUSION 1 LEVEL/HARDWARE REMOVAL;  Surgeon: Elaina Hoops, MD;  Location: [place] [facility];  Service: Neurosurgery;  Laterality: N/A;  Cervical four - five  Anterior cervical decompression fusion with redo at six - seven  Rm 33  . Posterior cervical fusion/foraminotomy N/A 08/26/2013    Procedure: C/4-5,C/5-6,C/6-7 Posterior Cervical Fusion w/lateral mass fixation;  Surgeon: Elaina Hoops, MD;  Location: [place] [facility];  Service: Neurosurgery;  Laterality: N/A;   History   Social History  . Marital Status: Single    Spouse Name: N/A  . Number of Children: N/A  . Years of Education: N/A   Occupational History  . Not on file.   Social History Main Topics  . Smoking status: Never Smoker   . Smokeless tobacco: Never Used  . Alcohol Use: No  . Drug Use: No  . Sexual Activity: Not on file   Other Topics Concern  . Not on file   Social History Narrative   Family History  Problem Relation Age of Onset  . Hypertension Mother         Objective:   Physical Exam  Constitutional: She is oriented to person, place, and time. She appears well-developed and well-nourished.  Neurological: She is alert and oriented to person, place, and time.  Psychiatric: She has a normal mood and affect. Her behavior is normal. Judgment and thought content normal.  Vitals reviewed.     Assessment & Plan:   1. Right hip pain  Will refer to Dr. Tamala Julian to evaluate and treat.  - Ambulatory referral to Orthopedic Surgery  2. Low back pain, unspecified back pain laterality, with sciatica presence unspecified Continue treatment by neurosurgeon.  - Ambulatory referral to Neurosurgery  Margarita Rana, MD

## 2015-01-20 DIAGNOSIS — M1611 Unilateral primary osteoarthritis, right hip: Secondary | ICD-10-CM | POA: Diagnosis not present

## 2015-01-20 DIAGNOSIS — M7061 Trochanteric bursitis, right hip: Secondary | ICD-10-CM | POA: Diagnosis not present

## 2015-02-06 ENCOUNTER — Telehealth: Payer: Self-pay | Admitting: Family Medicine

## 2015-03-10 ENCOUNTER — Other Ambulatory Visit: Payer: Self-pay | Admitting: Family Medicine

## 2015-03-10 DIAGNOSIS — I10 Essential (primary) hypertension: Secondary | ICD-10-CM

## 2015-03-10 NOTE — Telephone Encounter (Signed)
This is a pt of Dr. Venia Minks.  Next ov appointment is on 03/12/2015.  Thanks,

## 2015-03-12 ENCOUNTER — Encounter: Payer: Self-pay | Admitting: Physician Assistant

## 2015-03-12 ENCOUNTER — Ambulatory Visit (INDEPENDENT_AMBULATORY_CARE_PROVIDER_SITE_OTHER): Payer: Commercial Managed Care - HMO | Admitting: Physician Assistant

## 2015-03-12 VITALS — BP 102/70 | HR 86 | Temp 97.5°F | Resp 14 | Wt 167.6 lb

## 2015-03-12 DIAGNOSIS — I10 Essential (primary) hypertension: Secondary | ICD-10-CM

## 2015-03-12 DIAGNOSIS — K219 Gastro-esophageal reflux disease without esophagitis: Secondary | ICD-10-CM

## 2015-03-12 MED ORDER — PANTOPRAZOLE SODIUM 40 MG PO TBEC: 40.0000 mg | DELAYED_RELEASE_TABLET | Freq: Every day | ORAL | Status: DC

## 2015-03-12 MED ORDER — AMLODIPINE BESYLATE 10 MG PO TABS: 10.0000 mg | ORAL_TABLET | Freq: Every day | ORAL | Status: DC

## 2015-03-12 NOTE — Progress Notes (Signed)
Subjective:     Patient ID: [name], female   DOB: Oct 05, 1955, 59 y.o.   MRN: [id number]  HPI  GERD, Follow up:  The patient was last seen for GERD 8 months ago. Changes made since that visit include adding dexilant 60mg .  She reports poor compliance with treatment.  She was given Dexilant samples and took those, but has not had any treatment since she ran out.  She did undergo an EGD and colonoscopy at that time as well, which were normal. She is not having side effects.  She IS experiencing abdominal bloating, bilious reflux, fullness after meals, heartburn and midespigastric pain, burning with lying down and pain/burning after meals. She is NOT experiencing belching, chest pain, choking on food, cough, dysphagia, early satiety, hematemesis, hoarseness, laryngitis, melena, nausea, regurgitation of undigested food, shortness of breath or unexpected weight loss  ------------------------------------------------------------------------  Review of Systems  Constitutional: Negative.   HENT: Negative.   Eyes: Negative.   Respiratory: Negative.   Cardiovascular: Negative.   Gastrointestinal: Positive for abdominal pain and abdominal distention. Negative for nausea, vomiting, diarrhea, constipation and blood in stool.  Endocrine: Negative.   Genitourinary: Negative.   Musculoskeletal: Negative.   Skin: Negative.   Allergic/Immunologic: Negative.   Neurological: Negative.   Hematological: Negative.   Psychiatric/Behavioral: Negative.       Objective:   Physical Exam  Constitutional: She appears well-developed and well-nourished. No distress.  HENT:  Mouth/Throat: Oropharynx is clear and moist. No oropharyngeal exudate.  Neck: Normal range of motion. Neck supple. No JVD present. No tracheal deviation present. No thyromegaly present.  Cardiovascular: Normal rate, regular rhythm and normal heart sounds.  Exam reveals no gallop and no friction rub.   No murmur  heard. Pulmonary/Chest: Effort normal and breath sounds normal. No respiratory distress. She has no wheezes. She has no rales.  Abdominal: Soft. Bowel sounds are normal. She exhibits no distension and no mass. There is no hepatosplenomegaly. There is tenderness in the epigastric area. There is no rebound and no guarding.  Lymphadenopathy:    She has no cervical adenopathy.  Skin: She is not diaphoretic.  Vitals reviewed.     Assessment:     1. Gastroesophageal reflux disease without esophagitis   2. Essential (primary) hypertension        Plan:     1. Gastroesophageal reflux disease without esophagitis She has tried Prilosec in the past without relief. She has tried Dexilant 60 mg samples that were given in the office and had good response to that. She is also had an EGD which was normal. She has not had excellent in a couple months now and just recently over the last week to almost 2 weeks started developing severe acid reflux. She is also having mid epigastric pain and difficulty sleeping due to the heartburn when she lies flat. I instructed her to sleep sitting added approximately 30 angle propped up on pillows. I will give her Protonix as below to see if she has any benefit from that as we do not have any Dexilant samples in the office today, nor do we have any prescription discount cards. I did advise her to call the office if protonic does not help and we will proceed with trying to get prior authorization for Dexilant at that time. - pantoprazole (PROTONIX) 40 MG tablet; Take 1 tablet (40 mg total) by mouth daily.  Dispense: 30 tablet; Refill: 6  2. Essential (primary) hypertension Stable. Continue current medical treatment plan. - amLODipine (  NORVASC) 10 MG tablet; Take 1 tablet (10 mg total) by mouth daily.  Dispense: 30 tablet; Refill: 6

## 2015-03-12 NOTE — Patient Instructions (Signed)

## 2015-03-19 ENCOUNTER — Emergency Department: Payer: Commercial Managed Care - HMO

## 2015-03-19 ENCOUNTER — Encounter: Payer: Self-pay | Admitting: Emergency Medicine

## 2015-03-19 ENCOUNTER — Emergency Department
Admission: EM | Admit: 2015-03-19 | Discharge: 2015-03-20 | Disposition: A | Discharge status: Home / Self Care | Payer: Commercial Managed Care - HMO | Attending: Emergency Medicine | Admitting: Emergency Medicine

## 2015-03-19 ENCOUNTER — Other Ambulatory Visit: Payer: Self-pay

## 2015-03-19 DIAGNOSIS — Z79811 Long term (current) use of aromatase inhibitors: Secondary | ICD-10-CM | POA: Diagnosis not present

## 2015-03-19 DIAGNOSIS — R1010 Upper abdominal pain, unspecified: Secondary | ICD-10-CM

## 2015-03-19 DIAGNOSIS — R109 Unspecified abdominal pain: Secondary | ICD-10-CM | POA: Diagnosis present

## 2015-03-19 DIAGNOSIS — I10 Essential (primary) hypertension: Secondary | ICD-10-CM | POA: Diagnosis not present

## 2015-03-19 DIAGNOSIS — R1011 Right upper quadrant pain: Secondary | ICD-10-CM | POA: Diagnosis not present

## 2015-03-19 DIAGNOSIS — Z79899 Other long term (current) drug therapy: Secondary | ICD-10-CM | POA: Insufficient documentation

## 2015-03-19 DIAGNOSIS — K7689 Other specified diseases of liver: Secondary | ICD-10-CM | POA: Diagnosis not present

## 2015-03-19 LAB — URINALYSIS COMPLETE WITH MICROSCOPIC (ARMC ONLY)
BILIRUBIN URINE: NEGATIVE
Bacteria, UA: NONE SEEN
GLUCOSE, UA: NEGATIVE mg/dL
Leukocytes, UA: NEGATIVE
NITRITE: NEGATIVE
PH: 5 (ref 5.0–8.0)
Protein, ur: NEGATIVE mg/dL
Specific Gravity, Urine: 1.021 (ref 1.005–1.030)

## 2015-03-19 LAB — COMPREHENSIVE METABOLIC PANEL
ALT: 9 U/L — ABNORMAL LOW (ref 14–54)
ANION GAP: 8 (ref 5–15)
AST: 21 U/L (ref 15–41)
Albumin: 4.3 g/dL (ref 3.5–5.0)
Alkaline Phosphatase: 87 U/L (ref 38–126)
BUN: 8 mg/dL (ref 6–20)
CHLORIDE: 104 mmol/L (ref 101–111)
CO2: 29 mmol/L (ref 22–32)
Calcium: 9.4 mg/dL (ref 8.9–10.3)
Creatinine, Ser: 0.84 mg/dL (ref 0.44–1.00)
GFR calc Af Amer: >60 mL/min (ref >60)
Glucose, Bld: 95 mg/dL (ref 65–99)
POTASSIUM: 3.4 mmol/L — AB (ref 3.5–5.1)
Sodium: 141 mmol/L (ref 135–145)
Total Bilirubin: 0.4 mg/dL (ref 0.3–1.2)
Total Protein: 7.8 g/dL (ref 6.5–8.1)

## 2015-03-19 LAB — CBC
HCT: 42.8 % (ref 35.0–47.0)
Hemoglobin: 14.1 g/dL (ref 12.0–16.0)
MCH: 27.3 pg (ref 26.0–34.0)
MCHC: 32.9 g/dL (ref 32.0–36.0)
MCV: 83.1 fL (ref 80.0–100.0)
PLATELETS: 326 10*3/uL (ref 150–440)
RBC: 5.15 MIL/uL (ref 3.80–5.20)
RDW: 13.3 % (ref 11.5–14.5)
WBC: 10 10*3/uL (ref 3.6–11.0)

## 2015-03-19 LAB — LIPASE, BLOOD: LIPASE: 18 U/L — AB (ref 22–51)

## 2015-03-19 LAB — TROPONIN I: Troponin I: <0.03 ng/mL (ref <0.031)

## 2015-03-19 MED ORDER — SODIUM CHLORIDE 0.9 % IV BOLUS (SEPSIS)
1000.0000 mL | Freq: Once | INTRAVENOUS | Status: AC
  Administered 2015-03-19: 1000 mL via INTRAVENOUS

## 2015-03-19 MED ORDER — GI COCKTAIL ~~LOC~~
30.0000 mL | Freq: Once | ORAL | Status: AC
  Administered 2015-03-19: 30 mL via ORAL
  Filled 2015-03-19: qty 30

## 2015-03-19 MED ORDER — ONDANSETRON HCL 4 MG/2ML IJ SOLN
4.0000 mg | Freq: Once | INTRAMUSCULAR | Status: AC
  Administered 2015-03-19: 4 mg via INTRAVENOUS
  Filled 2015-03-19: qty 2

## 2015-03-19 MED ORDER — PANTOPRAZOLE SODIUM 40 MG IV SOLR
40.0000 mg | Freq: Once | INTRAVENOUS | Status: AC
  Administered 2015-03-19: 40 mg via INTRAVENOUS
  Filled 2015-03-19: qty 40

## 2015-03-19 MED ORDER — MORPHINE SULFATE (PF) 4 MG/ML IV SOLN
4.0000 mg | Freq: Once | INTRAVENOUS | Status: AC
  Administered 2015-03-19: 4 mg via INTRAVENOUS
  Filled 2015-03-19: qty 1

## 2015-03-19 NOTE — ED Provider Notes (Signed)
-----------------------------------------   11:52 PM on 03/19/2015 -----------------------------------------  Ultrasound abdomen limited RUQ interpreted per Dr. Clovis Riley: 1. No acute findings. 2. Liver cysts. 3. Hepatic steatosis.  Patient reports she is feeling much better. Smiling, denies nausea/vomiting. Mild epigastric tenderness to palpation without rebound or guarding on my exam. Discussed with patient and will write a prescription for Dexilant for her to resume. She states her PCP told her if the generic form did not work (which it did not), then she should fight her insurance company to resume the brand name. Strict return precautions given. Patient verbalizes understanding and agrees with plan of care.  Paulette Blanch, MD 03/20/15 [phone]

## 2015-03-19 NOTE — ED Provider Notes (Signed)
[facility] Emergency Department Provider Note  ____________________________________________  Time seen: Approximately 832 PM  I have reviewed the triage vital signs and the nursing notes.   HISTORY  Chief Complaint Abdominal Pain    HPI [name] is a 59 y.o. female with a history of GERD who is presenting today with worsening upper abdominal pain for one week. She says that she is out of her dexilant for the past month and when this happens she has this type of abdominal pain. She says that she has been taking gas relief medicine but has not working over the past week. She describes this pain as a tightness that is nonradiating. She has 1 day of associated diarrhea. No blood in her stool. No recent antibiotics. No chest pain or shortness of breath. No palpitations.She says that food does make the pain worse.   Past Medical History  Diagnosis Date  . Hypertension     takes Amlodipine daily  . Anemia     takes Ferrous Sulfate daily  . Arthritis   . Low back pain     occasionally  . GERD (gastroesophageal reflux disease)     takes OTC med  . Allergy   . Hyperlipidemia     Patient Active Problem List   Diagnosis Date Noted  . Right hip pain 01/07/2015  . Low back pain 01/07/2015  . Absolute anemia 01/06/2015  . Body mass index (BMI) of 28.0-28.9 in adult 01/06/2015  . Cervical disc disease 01/06/2015  . Atypical chest pain 01/06/2015  . Acute constipation 01/06/2015  . Diverticulitis 01/06/2015  . Generalized pruritus 01/06/2015  . Acid reflux 01/06/2015  . Cervicogenic headache 01/06/2015  . Hypercholesteremia 01/06/2015  . High potassium 01/06/2015  . Acute labyrinthitis 01/06/2015  . LBP (low back pain) 01/06/2015  . Pseudoarthrosis of cervical spine 08/26/2013  . Accumulation of fluid in tissues 12/24/2009  . Avitaminosis D 10/15/2009  . Cardiac conduction disorder 09/30/2008  . Essential (primary) hypertension 09/30/2008  .  Blood in the urine 09/30/2008    Past Surgical History  Procedure Laterality Date  . Neck surgery    . Tubal ligation    . Anterior cervical decomp/discectomy fusion  10/19/2011    Procedure: ANTERIOR CERVICAL DECOMPRESSION/DISCECTOMY FUSION 1 LEVEL/HARDWARE REMOVAL;  Surgeon: Elaina Hoops, MD;  Location: [place] [facility];  Service: Neurosurgery;  Laterality: N/A;  Cervical four - five  Anterior cervical decompression fusion with redo at six - seven  Rm 33  . Posterior cervical fusion/foraminotomy N/A 08/26/2013    Procedure: C/4-5,C/5-6,C/6-7 Posterior Cervical Fusion w/lateral mass fixation;  Surgeon: Elaina Hoops, MD;  Location: [place] [facility];  Service: Neurosurgery;  Laterality: N/A;    Current Outpatient Rx  Name  Route  Sig  Dispense  Refill  . amLODipine (NORVASC) 10 MG tablet   Oral   Take 1 tablet (10 mg total) by mouth daily.   30 tablet   6   . Cholecalciferol (VITAMIN D) 2000 UNITS tablet   Oral   Take 2,000 Units by mouth daily.         Marland Kitchen dexlansoprazole (DEXILANT) 60 MG capsule   Oral   Take by mouth.         . ferrous sulfate 325 (65 FE) MG tablet   Oral   Take 325 mg by mouth daily with breakfast.         . meloxicam (MOBIC) 15 MG tablet               .  Omega-3 Fatty Acids (FISH OIL) 1200 MG CAPS   Oral   Take 1 capsule by mouth daily.         Marland Kitchen OVER THE COUNTER MEDICATION   Oral   Take 1 tablet by mouth daily as needed. Gas Relief - for indigestion         . pantoprazole (PROTONIX) 40 MG tablet   Oral   Take 1 tablet (40 mg total) by mouth daily.   30 tablet   6   . Polyethylene Glycol 3350 POWD   Oral   Take by mouth.         . senna (SENOKOT) 8.6 MG tablet   Oral   Take by mouth.         . Simethicone (GAS RELIEF ULTRA STRENGTH) 180 MG CAPS   Oral   Take by mouth.         . simvastatin (ZOCOR) 20 MG tablet   Oral   Take by mouth.           Allergies Aspirin; Hydrochlorothiazide; Lisinopril; and Sulfa  antibiotics  Family History  Problem Relation Age of Onset  . Hypertension Mother     Social History Social History  Substance Use Topics  . Smoking status: Never Smoker   . Smokeless tobacco: Never Used  . Alcohol Use: No    Review of Systems Constitutional: No fever/chills Eyes: No visual changes. ENT: No sore throat. Cardiovascular: Denies chest pain. Respiratory: Denies shortness of breath. Gastrointestinal:  No constipation. Genitourinary: Negative for dysuria. Musculoskeletal: Negative for back pain. Skin: Negative for rash. Neurological: Negative for headaches, focal weakness or numbness.  10-point ROS otherwise negative.  ____________________________________________   PHYSICAL EXAM:  VITAL SIGNS: ED Triage Vitals  Enc Vitals Group     BP 03/19/15 1937 147/70 mmHg     Pulse Rate 03/19/15 1937 66     Resp 03/19/15 2100 16     Temp 03/19/15 1937 97.9 F (36.6 C)     Temp Source 03/19/15 1937 Oral     SpO2 03/19/15 1937 99 %     Weight 03/19/15 1937 165 lb (74.844 kg)     Height 03/19/15 1937 5\' 5"  (1.651 m)     Head Cir --      Peak Flow --      Pain Score 03/19/15 1938 10     Pain Loc --      Pain Edu? --      Excl. in [place]? --     Constitutional: Alert and oriented. Well appearing and in no acute distress. Eyes: Conjunctivae are normal. PERRL. EOMI. Head: Atraumatic. Nose: No congestion/rhinnorhea. Mouth/Throat: Mucous membranes are moist.  Oropharynx non-erythematous. Neck: No stridor.   Cardiovascular: Normal rate, regular rhythm. Grossly normal heart sounds.  Good peripheral circulation. Respiratory: Normal respiratory effort.  No retractions. Lungs CTAB. Gastrointestinal: Soft with tenderness across the upper abdomen. There is no guarding or rebound. There is a negative Murphy sign. No distention. No abdominal bruits. No CVA tenderness. Musculoskeletal: No lower extremity tenderness nor edema.  No joint effusions. Neurologic:  Normal speech and  language. No gross focal neurologic deficits are appreciated. No gait instability. Skin:  Skin is warm, dry and intact. No rash noted. Psychiatric: Mood and affect are normal. Speech and behavior are normal.  ____________________________________________   LABS (all labs ordered are listed, but only abnormal results are displayed)  Labs Reviewed  LIPASE, BLOOD - Abnormal; Notable for the following:    Lipase 18 (*)  All other components within normal limits  COMPREHENSIVE METABOLIC PANEL - Abnormal; Notable for the following:    Potassium 3.4 (*)    ALT 9 (*)    All other components within normal limits  URINALYSIS COMPLETEWITH MICROSCOPIC ([facility] ONLY) - Abnormal; Notable for the following:    Color, Urine YELLOW (*)    APPearance HAZY (*)    Ketones, ur TRACE (*)    Hgb urine dipstick 1+ (*)    Squamous Epithelial / LPF 0-5 (*)    All other components within normal limits  CBC  TROPONIN I   ____________________________________________  EKG  ED ECG REPORT I, Doran Stabler, the attending physician, personally viewed and interpreted this ECG.   Date: 03/19/2015  EKG Time: 2044  Rate: 53  Rhythm: sinus bradycardia  Axis: Normal axis  Intervals:none  ST&T Change: No ST elevations or depressions. No abnormal T-wave inversions.  ____________________________________________  RADIOLOGY  Chest x-ray without acute findings. I personally reviewed this image. ____________________________________________   PROCEDURES    ____________________________________________   INITIAL IMPRESSION / ASSESSMENT AND PLAN / ED COURSE  Pertinent labs & imaging results that were available during my care of the patient were reviewed by me and considered in my medical decision making (see chart for details).  ----------------------------------------- 10 PM on 03/19/2015 -----------------------------------------  Patient was reassessed and was having worsening pain after GI  cocktail. At this point the patient will need a further workup including cardiac labs as well as a right upper quadrant ultrasound to rule out gallbladder disease.  ----------------------------------------- 11:10 PM on 03/19/2015 ----------------------------------------- Case signed out to Dr. Beather Arbour will follow-up with patient's imaging and reassess.  ____________________________________________   FINAL CLINICAL IMPRESSION(S) / ED DIAGNOSES  Acute upper abdominal pain. Initial visit.    Orbie Pyo, MD 03/19/15 [phone]

## 2015-03-19 NOTE — ED Notes (Signed)
Pt complains of upper stomach pain. States it started about two weeks ago. Went to the primary and they gave her medicine for acid indigestion/reflux. Says it has progressively got worse over the past week. States she it feels 'tight and pushes up' says if she pushes on the area it gives her a little relief. Pt states it sometimes makes her feel like its going to pass out every once in awhile when she bends over.

## 2015-03-20 ENCOUNTER — Telehealth: Payer: Self-pay | Admitting: Family Medicine

## 2015-03-20 MED ORDER — ONDANSETRON HCL 4 MG PO TABS: 4.0000 mg | ORAL_TABLET | Freq: Three times a day (TID) | ORAL | Status: DC | PRN

## 2015-03-20 MED ORDER — DEXLANSOPRAZOLE 60 MG PO CPDR: 60.0000 mg | DELAYED_RELEASE_CAPSULE | Freq: Every day | ORAL | Status: DC

## 2015-03-20 MED ORDER — DICYCLOMINE HCL 20 MG PO TABS: 20.0000 mg | ORAL_TABLET | Freq: Four times a day (QID) | ORAL | Status: DC | PRN

## 2015-03-20 NOTE — Telephone Encounter (Signed)
Pt was discharged today from [facility] ER for stomach pain.  I have schedule a follow appt/MW

## 2015-03-20 NOTE — Telephone Encounter (Signed)
Please review. Thanks!  

## 2015-03-20 NOTE — Discharge Instructions (Signed)
1. Resume Delixant 60mg  daily (#30). 2. You may take medicines as needed for abdominal discomfort and nausea (Bentyl/Zofran #20). 3. Return to the ER for worsening symptoms, persistent vomiting, difficulty breathing or other concerns.  Abdominal Pain Many things can cause abdominal pain. Usually, abdominal pain is not caused by a disease and will improve without treatment. It can often be observed and treated at home. Your health care provider will do a physical exam and possibly order blood tests and X-rays to help determine the seriousness of your pain. However, in many cases, more time must pass before a clear cause of the pain can be found. Before that point, your health care provider may not know if you need more testing or further treatment. HOME CARE INSTRUCTIONS  Monitor your abdominal pain for any changes. The following actions may help to alleviate any discomfort you are experiencing:  Only take over-the-counter or prescription medicines as directed by your health care provider.  Do not take laxatives unless directed to do so by your health care provider.  Try a clear liquid diet (broth, tea, or water) as directed by your health care provider. Slowly move to a bland diet as tolerated. SEEK MEDICAL CARE IF:  You have unexplained abdominal pain.  You have abdominal pain associated with nausea or diarrhea.  You have pain when you urinate or have a bowel movement.  You experience abdominal pain that wakes you in the night.  You have abdominal pain that is worsened or improved by eating food.  You have abdominal pain that is worsened with eating fatty foods.  You have a fever. SEEK IMMEDIATE MEDICAL CARE IF:   Your pain does not go away within 2 hours.  You keep throwing up (vomiting).  Your pain is felt only in portions of the abdomen, such as the right side or the left lower portion of the abdomen.  You pass bloody or black tarry stools. MAKE SURE YOU:  Understand these  instructions.   Will watch your condition.   Will get help right away if you are not doing well or get worse.  Document Released: 04/20/2005 Document Revised: 07/16/2013 Document Reviewed: 03/20/2013 [facility] Patient Information 2015 [place], [place]. This information is not intended to replace advice given to you by your health care provider. Make sure you discuss any questions you have with your health care provider.

## 2015-03-21 ENCOUNTER — Other Ambulatory Visit: Payer: Self-pay

## 2015-03-21 ENCOUNTER — Emergency Department
Admission: EM | Admit: 2015-03-21 | Discharge: 2015-03-22 | Disposition: A | Discharge status: Home / Self Care | Payer: Commercial Managed Care - HMO | Attending: Emergency Medicine | Admitting: Emergency Medicine

## 2015-03-21 ENCOUNTER — Encounter: Payer: Self-pay | Admitting: Emergency Medicine

## 2015-03-21 DIAGNOSIS — Z79899 Other long term (current) drug therapy: Secondary | ICD-10-CM | POA: Insufficient documentation

## 2015-03-21 DIAGNOSIS — I10 Essential (primary) hypertension: Secondary | ICD-10-CM | POA: Insufficient documentation

## 2015-03-21 DIAGNOSIS — R1013 Epigastric pain: Secondary | ICD-10-CM | POA: Diagnosis not present

## 2015-03-21 DIAGNOSIS — K7689 Other specified diseases of liver: Secondary | ICD-10-CM | POA: Diagnosis not present

## 2015-03-21 LAB — COMPREHENSIVE METABOLIC PANEL
ALBUMIN: 4.1 g/dL (ref 3.5–5.0)
ALK PHOS: 100 U/L (ref 38–126)
ALT: 9 U/L — AB (ref 14–54)
AST: 25 U/L (ref 15–41)
Anion gap: 9 (ref 5–15)
BUN: 8 mg/dL (ref 6–20)
CALCIUM: 9.3 mg/dL (ref 8.9–10.3)
CO2: 29 mmol/L (ref 22–32)
CREATININE: 0.8 mg/dL (ref 0.44–1.00)
Chloride: 102 mmol/L (ref 101–111)
GFR calc non Af Amer: >60 mL/min (ref >60)
GLUCOSE: 117 mg/dL — AB (ref 65–99)
Potassium: 3.7 mmol/L (ref 3.5–5.1)
SODIUM: 140 mmol/L (ref 135–145)
Total Bilirubin: 0.3 mg/dL (ref 0.3–1.2)
Total Protein: 7.6 g/dL (ref 6.5–8.1)

## 2015-03-21 LAB — CBC
HCT: 42.9 % (ref 35.0–47.0)
HEMOGLOBIN: 13.9 g/dL (ref 12.0–16.0)
MCH: 27.1 pg (ref 26.0–34.0)
MCHC: 32.5 g/dL (ref 32.0–36.0)
MCV: 83.5 fL (ref 80.0–100.0)
Platelets: 321 10*3/uL (ref 150–440)
RBC: 5.13 MIL/uL (ref 3.80–5.20)
RDW: 13.5 % (ref 11.5–14.5)
WBC: 13.3 10*3/uL — ABNORMAL HIGH (ref 3.6–11.0)

## 2015-03-21 LAB — TROPONIN I: Troponin I: <0.03 ng/mL (ref <0.031)

## 2015-03-21 MED ORDER — SUCRALFATE 1 G PO TABS
1.0000 g | ORAL_TABLET | Freq: Once | ORAL | Status: AC
  Administered 2015-03-21: 1 g via ORAL
  Filled 2015-03-21: qty 1

## 2015-03-21 MED ORDER — FAMOTIDINE 20 MG PO TABS
40.0000 mg | ORAL_TABLET | Freq: Once | ORAL | Status: AC
  Administered 2015-03-21: 40 mg via ORAL
  Filled 2015-03-21: qty 2

## 2015-03-21 MED ORDER — GI COCKTAIL ~~LOC~~
30.0000 mL | Freq: Once | ORAL | Status: AC
  Administered 2015-03-21: 30 mL via ORAL
  Filled 2015-03-21: qty 30

## 2015-03-21 NOTE — ED Notes (Signed)
Patient presents to the ED with epigastric pain x 2 weeks but much worse today.  Patient is tearful, grimacing and guarding in triage.  Patient reports tenderness on palpation of epigastric area.  Patient denies vomiting and diarrhea, denies nausea.  Patient appears very uncomfortable.  Patient states she was seen in the ED for similar pain that wasn't quite as severe but nothing was found.  Patient is alert and oriented x 4.

## 2015-03-22 ENCOUNTER — Emergency Department: Payer: Commercial Managed Care - HMO

## 2015-03-22 DIAGNOSIS — Z79899 Other long term (current) drug therapy: Secondary | ICD-10-CM | POA: Diagnosis not present

## 2015-03-22 DIAGNOSIS — I10 Essential (primary) hypertension: Secondary | ICD-10-CM | POA: Diagnosis not present

## 2015-03-22 DIAGNOSIS — R1013 Epigastric pain: Secondary | ICD-10-CM | POA: Diagnosis not present

## 2015-03-22 DIAGNOSIS — K7689 Other specified diseases of liver: Secondary | ICD-10-CM | POA: Diagnosis not present

## 2015-03-22 LAB — LIPASE, BLOOD: LIPASE: 20 U/L — AB (ref 22–51)

## 2015-03-22 LAB — TROPONIN I: Troponin I: <0.03 ng/mL (ref <0.031)

## 2015-03-22 MED ORDER — IOHEXOL 240 MG/ML SOLN
25.0000 mL | Freq: Once | INTRAMUSCULAR | Status: AC | PRN
  Administered 2015-03-22: 25 mL via ORAL

## 2015-03-22 MED ORDER — SUCRALFATE 1 G PO TABS: 1.0000 g | ORAL_TABLET | Freq: Two times a day (BID) | ORAL | Status: DC

## 2015-03-22 MED ORDER — IOHEXOL 300 MG/ML  SOLN
100.0000 mL | Freq: Once | INTRAMUSCULAR | Status: AC | PRN
  Administered 2015-03-22: 100 mL via INTRAVENOUS

## 2015-03-22 NOTE — Discharge Instructions (Signed)

## 2015-03-22 NOTE — ED Notes (Signed)
Pt discharged to home, discharged instructions reviewed with patient.  Pt given prescriptions, instructed to follow up with GI doctor and return if symptoms worsen or if life threatening symptoms occur

## 2015-03-22 NOTE — ED Provider Notes (Signed)
[facility] Emergency Department Provider Note  ____________________________________________  Time seen: Approximately 2315 M  I have reviewed the triage vital signs and the nursing notes.   HISTORY  Chief Complaint Abdominal Pain    HPI [name] is a 59 y.o. female who comes in today with abdominal pain. The patient reports that she's had some epigastric pain for the last 2 weeks. She reports that she was here 2 days ago and received an ultrasound and blood work and everything was found to be negative. The patient reports that she feels bloated and the pain just does not go away. She reports that she was given some Dexilant for her pain and it has not been helping. She denies taking anything else. She reports the pain is a 10 out of 10 tonight. It is aching and constant. She reports that whenever she eats it does become worse. She has not yet seen her regular doctor or her GI physician but reports that she does have an appointment this week. The patient denies fever, nausea or vomiting. The patient has had some blurred vision with no chest pain or shortness of breath. The patient reports she is uncomfortable so she decided to come in for further evaluation this evening.   Past Medical History  Diagnosis Date  . Hypertension     takes Amlodipine daily  . Anemia     takes Ferrous Sulfate daily  . Arthritis   . Low back pain     occasionally  . GERD (gastroesophageal reflux disease)     takes OTC med  . Allergy   . Hyperlipidemia     Patient Active Problem List   Diagnosis Date Noted  . Right hip pain 01/07/2015  . Low back pain 01/07/2015  . Absolute anemia 01/06/2015  . Body mass index (BMI) of 28.0-28.9 in adult 01/06/2015  . Cervical disc disease 01/06/2015  . Atypical chest pain 01/06/2015  . Acute constipation 01/06/2015  . Diverticulitis 01/06/2015  . Generalized pruritus 01/06/2015  . Acid reflux 01/06/2015  . Cervicogenic headache  01/06/2015  . Hypercholesteremia 01/06/2015  . High potassium 01/06/2015  . Acute labyrinthitis 01/06/2015  . LBP (low back pain) 01/06/2015  . Pseudoarthrosis of cervical spine 08/26/2013  . Accumulation of fluid in tissues 12/24/2009  . Avitaminosis D 10/15/2009  . Cardiac conduction disorder 09/30/2008  . Essential (primary) hypertension 09/30/2008  . Blood in the urine 09/30/2008    Past Surgical History  Procedure Laterality Date  . Neck surgery    . Tubal ligation    . Anterior cervical decomp/discectomy fusion  10/19/2011    Procedure: ANTERIOR CERVICAL DECOMPRESSION/DISCECTOMY FUSION 1 LEVEL/HARDWARE REMOVAL;  Surgeon: Elaina Hoops, MD;  Location: [place] [facility];  Service: Neurosurgery;  Laterality: N/A;  Cervical four - five  Anterior cervical decompression fusion with redo at six - seven  Rm 33  . Posterior cervical fusion/foraminotomy N/A 08/26/2013    Procedure: C/4-5,C/5-6,C/6-7 Posterior Cervical Fusion w/lateral mass fixation;  Surgeon: Elaina Hoops, MD;  Location: [place] [facility];  Service: Neurosurgery;  Laterality: N/A;    Current Outpatient Rx  Name  Route  Sig  Dispense  Refill  . amLODipine (NORVASC) 10 MG tablet   Oral   Take 1 tablet (10 mg total) by mouth daily.   30 tablet   6   . Cholecalciferol (VITAMIN D) 2000 UNITS tablet   Oral   Take 2,000 Units by mouth daily.         Marland Kitchen  dexlansoprazole (DEXILANT) 60 MG capsule   Oral   Take 1 capsule (60 mg total) by mouth daily.   30 capsule   0   . dicyclomine (BENTYL) 20 MG tablet   Oral   Take 1 tablet (20 mg total) by mouth every 6 (six) hours as needed.   20 tablet   0   . ferrous sulfate 325 (65 FE) MG tablet   Oral   Take 325 mg by mouth daily with breakfast.         . meloxicam (MOBIC) 15 MG tablet               . Omega-3 Fatty Acids (FISH OIL) 1200 MG CAPS   Oral   Take 1 capsule by mouth daily.         . ondansetron (ZOFRAN) 4 MG tablet   Oral   Take 1 tablet (4 mg total) by mouth  every 8 (eight) hours as needed for nausea or vomiting.   20 tablet   1   . simvastatin (ZOCOR) 20 MG tablet   Oral   Take 20 mg by mouth daily.         . pantoprazole (PROTONIX) 40 MG tablet   Oral   Take 1 tablet (40 mg total) by mouth daily.   30 tablet   6   . sucralfate (CARAFATE) 1 G tablet   Oral   Take 1 tablet (1 g total) by mouth 2 (two) times daily.   30 tablet   0     Allergies Aspirin; Hydrochlorothiazide; Lisinopril; and Sulfa antibiotics  Family History  Problem Relation Age of Onset  . Hypertension Mother     Social History Social History  Substance Use Topics  . Smoking status: Never Smoker   . Smokeless tobacco: Never Used  . Alcohol Use: No    Review of Systems Constitutional: No fever/chills Eyes: No visual changes. ENT: No sore throat. Cardiovascular: Denies chest pain. Respiratory: Denies shortness of breath. Gastrointestinal:  abdominal pain.  No nausea, no vomiting.   Genitourinary: Negative for dysuria. Musculoskeletal: Negative for back pain. Skin: Negative for rash. Neurological: Negative for headaches, focal weakness or numbness.  10-point ROS otherwise negative.  ____________________________________________   PHYSICAL EXAM:  VITAL SIGNS: ED Triage Vitals  Enc Vitals Group     BP 03/21/15 2255 121/73 mmHg     Pulse Rate 03/21/15 2255 56     Resp 03/21/15 2255 20     Temp 03/21/15 2255 98.1 F (36.7 C)     Temp Source 03/21/15 2255 Oral     SpO2 03/21/15 2255 100 %     Weight 03/21/15 2255 163 lb (73.936 kg)     Height 03/21/15 2255 5\' 5"  (1.651 m)     Head Cir --      Peak Flow --      Pain Score 03/21/15 2256 10     Pain Loc --      Pain Edu? --      Excl. in [place]? --     Constitutional: Alert and oriented. Well appearing and in moderate distress. Eyes: Conjunctivae are normal. PERRL. EOMI. Head: Atraumatic. Nose: No congestion/rhinnorhea. Mouth/Throat: Mucous membranes are moist.  Oropharynx  non-erythematous. Cardiovascular: Normal rate, regular rhythm. Grossly normal heart sounds.  Good peripheral circulation. Respiratory: Normal respiratory effort.  No retractions. Lungs CTAB. Gastrointestinal: Soft with epigastric pain. No distention. Positive bowel sounds Musculoskeletal: No lower extremity tenderness nor edema.  No joint effusions. Neurologic:  Normal speech and language.  Skin:  Skin is warm, dry and intact.  Psychiatric: Mood and affect are normal.   ____________________________________________   LABS (all labs ordered are listed, but only abnormal results are displayed)  Labs Reviewed  CBC - Abnormal; Notable for the following:    WBC 13.3 (*)    All other components within normal limits  COMPREHENSIVE METABOLIC PANEL - Abnormal; Notable for the following:    Glucose, Bld 117 (*)    ALT 9 (*)    All other components within normal limits  LIPASE, BLOOD - Abnormal; Notable for the following:    Lipase 20 (*)    All other components within normal limits  TROPONIN I  TROPONIN I   ____________________________________________  EKG  ED ECG REPORT I, Loney Hering, the attending physician, personally viewed and interpreted this ECG.   Date: 03/21/2015  EKG Time: 2253  Rate: 57  Rhythm: sinus bradycardia  Axis: Normal  Intervals:none  ST&T Change: None  ____________________________________________  RADIOLOGY  CT abdomen and pelvis: Suggestion of mild wall thickening in the ileum, possibly indicating enteritis or inflammatory bowel disease. Unchanged appearance of the multiple hepatic cysts with diffuse tiny cystic changes. ____________________________________________   PROCEDURES  Procedure(s) performed: None  Critical Care performed: No  ____________________________________________   INITIAL IMPRESSION / ASSESSMENT AND PLAN / ED COURSE  Pertinent labs & imaging results that were available during my care of the patient were reviewed by  me and considered in my medical decision making (see chart for details).  This is a 59 year old female who comes in for a second visit with some epigastric pain. I did give the patient a dose of Carafate, Pepcid and a GI cocktail. The patient had an ultrasound previously but I will do a troponin and I will do a CT scan to evaluate possible cause of the patient's pain.  The patient's pain was improved with the Carafate, Pepcid and GI cocktail. I informed the patient of her results of her CT and discussed with her the need to follow-up with GI. The patient reports that she will do so. Since the Carafate to help with her pain I will discharge the patient with a prescription for Carafate and have her follow-up with GI. The patient does have an appointment with her primary care physician in 2 days. ____________________________________________   FINAL CLINICAL IMPRESSION(S) / ED DIAGNOSES  Final diagnoses:  Epigastric pain      Loney Hering, MD 03/22/15 [phone]

## 2015-03-24 ENCOUNTER — Ambulatory Visit (INDEPENDENT_AMBULATORY_CARE_PROVIDER_SITE_OTHER): Payer: Commercial Managed Care - HMO | Admitting: Family Medicine

## 2015-03-24 ENCOUNTER — Encounter: Payer: Self-pay | Admitting: Family Medicine

## 2015-03-24 VITALS — BP 110/70 | HR 56 | Temp 98.0°F | Resp 16 | Ht 65.0 in | Wt 165.0 lb

## 2015-03-24 DIAGNOSIS — R1013 Epigastric pain: Secondary | ICD-10-CM | POA: Insufficient documentation

## 2015-03-24 DIAGNOSIS — H8110 Benign paroxysmal vertigo, unspecified ear: Secondary | ICD-10-CM | POA: Insufficient documentation

## 2015-03-24 DIAGNOSIS — K219 Gastro-esophageal reflux disease without esophagitis: Secondary | ICD-10-CM | POA: Diagnosis not present

## 2015-03-24 MED ORDER — MECLIZINE HCL 25 MG PO TABS: 25.0000 mg | ORAL_TABLET | Freq: Three times a day (TID) | ORAL | Status: DC | PRN

## 2015-03-24 NOTE — Patient Instructions (Signed)
Benign Positional Vertigo Vertigo means you feel like you or your surroundings are moving when they are not. Benign positional vertigo is the most common form of vertigo. Benign means that the cause of your condition is not serious. Benign positional vertigo is more common in older adults. CAUSES  Benign positional vertigo is the result of an upset in the labyrinth system. This is an area in the middle ear that helps control your balance. This may be caused by a viral infection, head injury, or repetitive motion. However, often no specific cause is found. SYMPTOMS  Symptoms of benign positional vertigo occur when you move your head or eyes in different directions. Some of the symptoms may include:  Loss of balance and falls.  Vomiting.  Blurred vision.  Dizziness.  Nausea.  Involuntary eye movements (nystagmus). DIAGNOSIS  Benign positional vertigo is usually diagnosed by physical exam. If the specific cause of your benign positional vertigo is unknown, your caregiver may perform imaging tests, such as magnetic resonance imaging (MRI) or computed tomography (CT). TREATMENT  Your caregiver may recommend movements or procedures to correct the benign positional vertigo. Medicines such as meclizine, benzodiazepines, and medicines for nausea may be used to treat your symptoms. In rare cases, if your symptoms are caused by certain conditions that affect the inner ear, you may need surgery. HOME CARE INSTRUCTIONS   Follow your caregiver's instructions.  Move slowly. Do not make sudden body or head movements.  Avoid driving.  Avoid operating heavy machinery.  Avoid performing any tasks that would be dangerous to you or others during a vertigo episode.  Drink enough fluids to keep your urine clear or pale yellow. SEEK IMMEDIATE MEDICAL CARE IF:   You develop problems with walking, weakness, numbness, or using your arms, hands, or legs.  You have difficulty speaking.  You develop  severe headaches.  Your nausea or vomiting continues or gets worse.  You develop visual changes.  Your family or friends notice any behavioral changes.  Your condition gets worse.  You have a fever.  You develop a stiff neck or sensitivity to light. MAKE SURE YOU:   Understand these instructions.  Will watch your condition.  Will get help right away if you are not doing well or get worse. Document Released: 04/18/2006 Document Revised: 10/03/2011 Document Reviewed: 03/31/2011 ExitCare Patient Information 2015 [employer]. This information is not intended to replace advice given to you by your health care provider. Make sure you discuss any questions you have with your health care provider.    

## 2015-03-24 NOTE — Progress Notes (Signed)
Patient ID: [name], female   DOB: Dec 12, 1955, 59 y.o.   MRN: [id number]        Patient: [name] Female    DOB: [birth year]   59 y.o.   MRN: [id number] Visit Date: 03/24/2015  Today's Provider: Margarita Rana, MD   Chief Complaint  Patient presents with  . Hospitalization Follow-up  . Abdominal Pain   Subjective:    HPI   ER Follow up  Patient was seen at [facility] ER on 03/19/2015 then went back to ER on 03/21/2015 and discharged on 03/22/2015. She was treated for epigastric pain. Treatment for this included GI cocktail, labs, CT abdominal Pelvis with contrast, and EKG. She reports good compliance with treatment. She reports this condition is Improved. Patient denies abdominal pain, nausea, vomiting, diarrhea and constipation.  Patient reports she is back on regular diet. Patient reports that she was recommended by ER physician to schedule GI appointment. Improved on Sucralfate and Dexilant. Feeling better today.   Still has blurry vision and weakness. Feels like going to pass out at church.     Allergies  Allergen Reactions  . Aspirin Nausea Only  . Hydrochlorothiazide     Rash  . Lisinopril     Other reaction(s): Unknown  . Sulfa Antibiotics Hives and Nausea Only   Previous Medications   AMLODIPINE (NORVASC) 10 MG TABLET    Take 1 tablet (10 mg total) by mouth daily.   CHOLECALCIFEROL (VITAMIN D) 2000 UNITS TABLET    Take 2,000 Units by mouth daily.   DEXLANSOPRAZOLE (DEXILANT) 60 MG CAPSULE    Take 1 capsule (60 mg total) by mouth daily.   DICYCLOMINE (BENTYL) 20 MG TABLET    Take 1 tablet (20 mg total) by mouth every 6 (six) hours as needed.   FERROUS SULFATE 325 (65 FE) MG TABLET    Take 325 mg by mouth daily with breakfast.   MELOXICAM (MOBIC) 15 MG TABLET    15 mg daily.    OMEGA-3 FATTY ACIDS (FISH OIL) 1200 MG CAPS    Take 1 capsule by mouth daily.   ONDANSETRON (ZOFRAN) 4 MG TABLET    Take 1 tablet (4 mg total) by mouth every 8 (eight) hours as needed  for nausea or vomiting.   PANTOPRAZOLE (PROTONIX) 40 MG TABLET    Take 1 tablet (40 mg total) by mouth daily.   SIMVASTATIN (ZOCOR) 20 MG TABLET    Take 20 mg by mouth daily.   SUCRALFATE (CARAFATE) 1 G TABLET    Take 1 tablet (1 g total) by mouth 2 (two) times daily.    Review of Systems  Constitutional: Positive for fatigue.  Gastrointestinal: Negative.   Neurological: Positive for dizziness (just does not feel right.  Feels like things are spinnning. ) and light-headedness.    Social History  Substance Use Topics  . Smoking status: Never Smoker   . Smokeless tobacco: Never Used  . Alcohol Use: No   Objective:   BP 110/70 mmHg  Pulse 56  Temp(Src) 98 F (36.7 C) (Oral)  Resp 16  Ht 5\' 5"  (1.651 m)  Wt 165 lb (74.844 kg)  BMI 27.46 kg/m2  SpO2 97%  Physical Exam  Constitutional: She is oriented to person, place, and time. She appears well-developed and well-nourished.  HENT:  Head: Normocephalic and atraumatic.  Right Ear: External ear normal.  Left Ear: External ear normal.  Nose: Nose normal.  Mouth/Throat: Oropharynx is clear and moist.  Eyes: Conjunctivae and EOM  are normal. Pupils are equal, round, and reactive to light.  Neck: Normal range of motion. Neck supple.  Cardiovascular: Normal rate and regular rhythm.   Pulmonary/Chest: Effort normal.  Abdominal: Soft. Bowel sounds are normal.  Neurological: She is alert and oriented to person, place, and time.  Did have dizziness with head maneuvers. Reproduced symptoms.   Psychiatric: She has a normal mood and affect. Her behavior is normal.        Assessment & Plan:     1. Epigastric pain Improved on current medication, will refer.  - Ambulatory referral to Gastroenterology  2. Gastroesophageal reflux disease without esophagitis As noted above.  - Ambulatory referral to Gastroenterology  3. BPPV (benign paroxysmal positional vertigo), unspecified laterality Condition is worsening. Will start medication  for better control. Patient instructed to call back if condition worsens or does not improve.    - meclizine (ANTIVERT) 25 MG tablet; Take 1 tablet (25 mg total) by mouth 3 (three) times daily as needed for dizziness.  Dispense: 30 tablet; Refill: 0      Margarita Rana, MD  [facility]

## 2015-03-26 DIAGNOSIS — M542 Cervicalgia: Secondary | ICD-10-CM | POA: Diagnosis not present

## 2015-03-26 DIAGNOSIS — M5137 Other intervertebral disc degeneration, lumbosacral region: Secondary | ICD-10-CM | POA: Diagnosis not present

## 2015-04-15 ENCOUNTER — Other Ambulatory Visit: Payer: Self-pay | Admitting: Family Medicine

## 2015-04-15 DIAGNOSIS — K219 Gastro-esophageal reflux disease without esophagitis: Secondary | ICD-10-CM

## 2015-04-15 MED ORDER — DEXLANSOPRAZOLE 60 MG PO CPDR: 60.0000 mg | DELAYED_RELEASE_CAPSULE | Freq: Every day | ORAL | Status: DC

## 2015-04-15 NOTE — Addendum Note (Signed)
Addended by: Ashley Royalty E on: 04/15/2015 02:59 PM   Modules accepted: Orders

## 2015-04-15 NOTE — Telephone Encounter (Signed)
Pt contacted office for refill request on the following medications: dexlansoprazole (DEXILANT) 60 MG capsule to [facility]. Pt was given this in March of 2016 from Dr. Venia Minks and again in August from the ER. Pt stated it works great and she would like a refill. Pt request the name brand if possible. Thanks TNP

## 2015-05-05 ENCOUNTER — Other Ambulatory Visit: Payer: Self-pay

## 2015-05-05 ENCOUNTER — Ambulatory Visit (INDEPENDENT_AMBULATORY_CARE_PROVIDER_SITE_OTHER): Payer: Commercial Managed Care - HMO | Admitting: Gastroenterology

## 2015-05-05 ENCOUNTER — Encounter: Payer: Self-pay | Admitting: Gastroenterology

## 2015-05-05 VITALS — BP 155/71 | HR 69 | Temp 98.3°F | Ht 65.0 in | Wt 167.0 lb

## 2015-05-05 DIAGNOSIS — R1013 Epigastric pain: Secondary | ICD-10-CM | POA: Diagnosis not present

## 2015-05-05 NOTE — Progress Notes (Signed)
Primary Care Physician: Margarita Rana, MD  Primary Gastroenterologist:  Dr. [name]  Chief Complaint  Patient presents with  . Abdominal Pain    HPI: [name] is a 59 y.o. female here abdominal pain she reports is worse when she eats greasy foods and fatty foods. The patient has been boiling things and states her symptoms have been much better. The patient also was on meloxicam for leg pain and neck surgery. The patient states that after stopping that her symptoms have gotten somewhat better. There is no report of any unexplained weight loss fevers chills or bloody stools. The patient does take iron and states that sometimes she has black stools from the iron but no black diarrhea. The patient states that her gallbladder is still in.  Current Outpatient Prescriptions  Medication Sig Dispense Refill  . amLODipine (NORVASC) 10 MG tablet Take 1 tablet (10 mg total) by mouth daily. 30 tablet 6  . Cholecalciferol (VITAMIN D) 2000 UNITS tablet Take 2,000 Units by mouth daily.    Marland Kitchen dexlansoprazole (DEXILANT) 60 MG capsule Take 1 capsule (60 mg total) by mouth daily. 30 capsule 5  . ferrous sulfate 325 (65 FE) MG tablet Take 325 mg by mouth daily with breakfast.    . Omega-3 Fatty Acids (FISH OIL) 1200 MG CAPS Take 1 capsule by mouth daily.    . simvastatin (ZOCOR) 20 MG tablet Take 20 mg by mouth daily.     No current facility-administered medications for this visit.    Allergies as of 05/05/2015 - Review Complete 05/05/2015  Allergen Reaction Noted  . Aspirin Nausea Only 10/11/2011  . Hydrochlorothiazide  01/06/2015  . Lisinopril  01/07/2015  . Sulfa antibiotics Hives and Nausea Only 10/11/2011    ROS:  General: Negative for anorexia, weight loss, fever, chills, fatigue, weakness. ENT: Negative for hoarseness, difficulty swallowing , nasal congestion. CV: Negative for chest pain, angina, palpitations, dyspnea on exertion, peripheral edema.  Respiratory: Negative for  dyspnea at rest, dyspnea on exertion, cough, sputum, wheezing.  GI: See history of present illness. GU:  Negative for dysuria, hematuria, urinary incontinence, urinary frequency, nocturnal urination.  Endo: Negative for unusual weight change.    Physical Examination:   BP 155/71 mmHg  Pulse 69  Temp(Src) 98.3 F (36.8 C) (Oral)  Ht 5\' 5"  (1.651 m)  Wt 167 lb (75.751 kg)  BMI 27.79 kg/m2  General: Well-nourished, well-developed in no acute distress.  Eyes: No icterus. Conjunctivae pink. Mouth: Oropharyngeal mucosa moist and pink , no lesions erythema or exudate. Lungs: Clear to auscultation bilaterally. Non-labored. Heart: Regular rate and rhythm, no murmurs rubs or gallops.  Abdomen: Bowel sounds are normal, nontender, nondistended, no hepatosplenomegaly or masses, no abdominal bruits or hernia , no rebound or guarding.   Extremities: No lower extremity edema. No clubbing or deformities. Neuro: Alert and oriented x 3.  Grossly intact. Skin: Warm and dry, no jaundice.   Psych: Alert and cooperative, normal mood and affect.  Labs:    Imaging Studies: No results found.  Assessment and Plan:   [name] is a 59 y.o. y/o female who comes in with abdominal pain after eating. The pain is worse with greasy or fatty foods. The patient will be set up for an ultrasound of the right upper quadrant. The patient has also been set up for an EGD because of the risk of peptic ulcer disease due to her use of meloxicam. I have discussed risks & benefits which include, but are  not limited to, bleeding, infection, perforation & drug reaction.  The patient agrees with this plan & written consent will be obtained.      Note: This dictation was prepared with Dragon dictation along with smaller phrase technology. Any transcriptional errors that result from this process are unintentional.

## 2015-05-12 ENCOUNTER — Ambulatory Visit: Payer: Commercial Managed Care - HMO

## 2015-05-13 ENCOUNTER — Telehealth: Payer: Self-pay | Admitting: Gastroenterology

## 2015-05-13 NOTE — Telephone Encounter (Signed)
Per [place], CPT code [phone] does not require Pre-Authorization. If you have any questions, please reply or contact our call center at [phone].

## 2015-05-15 ENCOUNTER — Other Ambulatory Visit: Payer: Self-pay

## 2015-05-18 ENCOUNTER — Ambulatory Visit
Admission: RE | Admit: 2015-05-18 | Discharge: 2015-05-18 | Disposition: A | Discharge status: Home / Self Care | Payer: Commercial Managed Care - HMO | Source: Ambulatory Visit | Attending: Gastroenterology | Admitting: Gastroenterology

## 2015-05-18 DIAGNOSIS — R1013 Epigastric pain: Secondary | ICD-10-CM | POA: Diagnosis not present

## 2015-05-18 DIAGNOSIS — K760 Fatty (change of) liver, not elsewhere classified: Secondary | ICD-10-CM | POA: Diagnosis not present

## 2015-05-18 DIAGNOSIS — K7689 Other specified diseases of liver: Secondary | ICD-10-CM | POA: Diagnosis not present

## 2015-05-20 ENCOUNTER — Other Ambulatory Visit: Payer: Self-pay | Admitting: Family Medicine

## 2015-05-20 ENCOUNTER — Telehealth: Payer: Self-pay

## 2015-05-20 DIAGNOSIS — E7800 Pure hypercholesterolemia, unspecified: Secondary | ICD-10-CM

## 2015-05-20 NOTE — Telephone Encounter (Signed)
LVM for pt to return my call regarding US results.  

## 2015-05-20 NOTE — Telephone Encounter (Signed)
Pt notified of results of [place]. Pt to keep EGD appt at the end of November.

## 2015-05-20 NOTE — Telephone Encounter (Signed)
-----   Message from [name], MD sent at 05/19/2015 10:17 AM EDT ----- Let the patient know the scan showed fatty liver but no source for her pain.

## 2015-06-23 ENCOUNTER — Ambulatory Visit: Payer: Commercial Managed Care - HMO | Admitting: Anesthesiology

## 2015-06-23 ENCOUNTER — Encounter
Admission: RE | Disposition: A | Discharge status: Home / Self Care | Payer: Self-pay | Source: Ambulatory Visit | Attending: Gastroenterology

## 2015-06-23 ENCOUNTER — Ambulatory Visit
Admission: RE | Admit: 2015-06-23 | Discharge: 2015-06-23 | Disposition: A | Discharge status: Home / Self Care | Payer: Commercial Managed Care - HMO | Source: Ambulatory Visit | Attending: Gastroenterology | Admitting: Gastroenterology

## 2015-06-23 DIAGNOSIS — R1013 Epigastric pain: Secondary | ICD-10-CM | POA: Diagnosis not present

## 2015-06-23 DIAGNOSIS — Z8249 Family history of ischemic heart disease and other diseases of the circulatory system: Secondary | ICD-10-CM | POA: Insufficient documentation

## 2015-06-23 DIAGNOSIS — K2970 Gastritis, unspecified, without bleeding: Secondary | ICD-10-CM | POA: Insufficient documentation

## 2015-06-23 DIAGNOSIS — Z888 Allergy status to other drugs, medicaments and biological substances status: Secondary | ICD-10-CM | POA: Insufficient documentation

## 2015-06-23 DIAGNOSIS — Z981 Arthrodesis status: Secondary | ICD-10-CM | POA: Insufficient documentation

## 2015-06-23 DIAGNOSIS — Z886 Allergy status to analgesic agent status: Secondary | ICD-10-CM | POA: Diagnosis not present

## 2015-06-23 DIAGNOSIS — M1990 Unspecified osteoarthritis, unspecified site: Secondary | ICD-10-CM | POA: Diagnosis not present

## 2015-06-23 DIAGNOSIS — Z882 Allergy status to sulfonamides status: Secondary | ICD-10-CM | POA: Insufficient documentation

## 2015-06-23 DIAGNOSIS — Z79899 Other long term (current) drug therapy: Secondary | ICD-10-CM | POA: Insufficient documentation

## 2015-06-23 DIAGNOSIS — K219 Gastro-esophageal reflux disease without esophagitis: Secondary | ICD-10-CM | POA: Diagnosis not present

## 2015-06-23 DIAGNOSIS — Z9889 Other specified postprocedural states: Secondary | ICD-10-CM | POA: Diagnosis not present

## 2015-06-23 DIAGNOSIS — I10 Essential (primary) hypertension: Secondary | ICD-10-CM | POA: Insufficient documentation

## 2015-06-23 DIAGNOSIS — D649 Anemia, unspecified: Secondary | ICD-10-CM | POA: Diagnosis not present

## 2015-06-23 DIAGNOSIS — E785 Hyperlipidemia, unspecified: Secondary | ICD-10-CM | POA: Insufficient documentation

## 2015-06-23 DIAGNOSIS — K2950 Unspecified chronic gastritis without bleeding: Secondary | ICD-10-CM | POA: Insufficient documentation

## 2015-06-23 HISTORY — PX: ESOPHAGOGASTRODUODENOSCOPY (EGD) WITH PROPOFOL: SHX5813

## 2015-06-23 SURGERY — ESOPHAGOGASTRODUODENOSCOPY (EGD) WITH PROPOFOL
Anesthesia: General

## 2015-06-23 MED ORDER — LIDOCAINE HCL (CARDIAC) 20 MG/ML IV SOLN
INTRAVENOUS | Status: DC | PRN
  Administered 2015-06-23: 80 mg via INTRAVENOUS

## 2015-06-23 MED ORDER — MIDAZOLAM HCL 2 MG/2ML IJ SOLN
INTRAMUSCULAR | Status: DC | PRN
Start: 2015-06-23 — End: 2015-06-23
  Administered 2015-06-23: 2 mg via INTRAVENOUS

## 2015-06-23 MED ORDER — PROPOFOL 500 MG/50ML IV EMUL
INTRAVENOUS | Status: DC | PRN
Start: 2015-06-23 — End: 2015-06-23
  Administered 2015-06-23: 160 ug/kg/min via INTRAVENOUS

## 2015-06-23 MED ORDER — SODIUM CHLORIDE 0.9 % IV SOLN
INTRAVENOUS | Status: DC
  Administered 2015-06-23: 1000 mL via INTRAVENOUS

## 2015-06-23 NOTE — Transfer of Care (Signed)
Immediate Anesthesia Transfer of Care Note  Patient: [name]  Procedure(s) Performed: Procedure(s): ESOPHAGOGASTRODUODENOSCOPY (EGD) WITH PROPOFOL (N/A)  Patient Location: PACU and Endoscopy Unit  Anesthesia Type:General  Level of Consciousness: sedated  Airway & Oxygen Therapy: Patient Spontanous Breathing and Patient connected to nasal cannula oxygen  Post-op Assessment: Report given to RN and Post -op Vital signs reviewed and stable  Post vital signs: Reviewed and stable  Last Vitals: 8:14 113/58 12 resp 62 hr 98% sat  Filed Vitals:   06/23/15 0716  BP: 145/79  Pulse: 64  Temp: 36.8 C  Resp: 18    Complications: No apparent anesthesia complications

## 2015-06-23 NOTE — Op Note (Signed)
[facility]enter[facility] Patient Name: [name] Procedure Date: 06/23/2015 8:01 AM MRN: [id number] Account #: [id number] Date of Birth: [birth year] Admit Type: Outpatient Age: 59 Room: [facility] ENDO ROOM 4 Gender: Female Note Status: Finalized Procedure:         Upper GI endoscopy Indications:       Epigastric abdominal pain Providers:         [name], MD Referring MD:      Jerrell Belfast, MD (Referring MD) Medicines:         Propofol per Anesthesia Complications:     No immediate complications. Procedure:         Pre-Anesthesia Assessment:                    - Prior to the procedure, a History and Physical was                     performed, and patient medications and allergies were                     reviewed. The patient's tolerance of previous anesthesia                     was also reviewed. The risks and benefits of the procedure                     and the sedation options and risks were discussed with the                     patient. All questions were answered, and informed consent                     was obtained. Prior Anticoagulants: The patient has taken                     no previous anticoagulant or antiplatelet agents. ASA                     Grade Assessment: II - A patient with mild systemic                     disease. After reviewing the risks and benefits, the                     patient was deemed in satisfactory condition to undergo                     the procedure.                    After obtaining informed consent, the endoscope was passed                     under direct vision. Throughout the procedure, the                     patient's blood pressure, pulse, and oxygen saturations                     were monitored continuously. The Endoscope was introduced                     through the mouth, and advanced to the second part of  duodenum. The upper GI endoscopy was accomplished without        difficulty. The patient tolerated the procedure well. Findings:      The examined esophagus was normal.      Localized moderate inflammation characterized by erythema was found in       the gastric antrum. Biopsies were taken with a cold forceps for       histology. Biopsies were taken with a cold forceps for histology.      The examined duodenum was normal. Impression:        - Normal esophagus.                    - Gastritis. Biopsied.                    - Normal examined duodenum. Recommendation:    - Await pathology results.                    - Continue present medications. Procedure Code(s): --- Professional ---                    [phone], Esophagogastroduodenoscopy, flexible, transoral;                     with biopsy, single or multiple Diagnosis Code(s): --- Professional ---                    R10.13, Epigastric pain                    K29.70, Gastritis, unspecified, without bleeding CPT copyright 2014 American Medical Association. All rights reserved. The codes documented in this report are preliminary and upon coder review may  be revised to meet current compliance requirements. [name], MD 06/23/2015 8:08:44 AM This report has been signed electronically. Number of Addenda: 0 Note Initiated On: 06/23/2015 8:01 AM      [facility]

## 2015-06-23 NOTE — Anesthesia Postprocedure Evaluation (Signed)
Anesthesia Post Note  Patient: [name]  Procedure(s) Performed: Procedure(s) (LRB): ESOPHAGOGASTRODUODENOSCOPY (EGD) WITH PROPOFOL (N/A)  Patient location during evaluation: PACU Anesthesia Type: General Level of consciousness: awake and alert Pain management: pain level controlled Vital Signs Assessment: post-procedure vital signs reviewed and stable Respiratory status: spontaneous breathing, nonlabored ventilation, respiratory function stable and patient connected to nasal cannula oxygen Cardiovascular status: blood pressure returned to baseline and stable Postop Assessment: no signs of nausea or vomiting Anesthetic complications: no    Last Vitals:  Filed Vitals:   06/23/15 0833 06/23/15 0843  BP: 118/64 124/68  Pulse: 57 60  Temp:    Resp: 15 19    Last Pain: There were no vitals filed for this visit.               Molli Barrows

## 2015-06-23 NOTE — H&P (Signed)
[facility]  [address] [place], [place] [address] Phone: [phone] Fax : [phone]  Primary Care Physician:  Lindsay Rana, MD Primary Gastroenterologist:  Dr. Allen Patton  Pre-Procedure History & Physical: HPI:  [name] is a 59 y.o. female is here for an endoscopy.   Past Medical History  Diagnosis Date  . Hypertension     takes Amlodipine daily  . Anemia     takes Ferrous Sulfate daily  . Arthritis   . Low back pain     occasionally  . GERD (gastroesophageal reflux disease)     takes OTC med  . Allergy   . Hyperlipidemia     Past Surgical History  Procedure Laterality Date  . Neck surgery    . Tubal ligation    . Anterior cervical decomp/discectomy fusion  10/19/2011    Procedure: ANTERIOR CERVICAL DECOMPRESSION/DISCECTOMY FUSION 1 LEVEL/HARDWARE REMOVAL;  Surgeon: Lindsay Hoops, MD;  Location: [place] [facility];  Service: Neurosurgery;  Laterality: N/A;  Cervical four - five  Anterior cervical decompression fusion with redo at six - seven  Rm 33  . Posterior cervical fusion/foraminotomy N/A 08/26/2013    Procedure: C/4-5,C/5-6,C/6-7 Posterior Cervical Fusion w/lateral mass fixation;  Surgeon: Lindsay Hoops, MD;  Location: [place] [facility];  Service: Neurosurgery;  Laterality: N/A;  . Colonoscopy  08/2014    polyps    Prior to Admission medications   Medication Sig Start Date End Date Taking? Authorizing Provider  amLODipine (NORVASC) 10 MG tablet Take 1 tablet (10 mg total) by mouth daily. 03/12/15  Yes Lindsay Daring, PA-C  Cholecalciferol (VITAMIN D) 2000 UNITS tablet Take 2,000 Units by mouth daily.    Historical Provider, MD  dexlansoprazole (DEXILANT) 60 MG capsule Take 1 capsule (60 mg total) by mouth daily. 04/15/15   Lindsay Rana, MD  dicyclomine (BENTYL) 20 MG tablet  03/20/15   Historical Provider, MD  ferrous sulfate 325 (65 FE) MG tablet Take 325 mg by mouth daily with breakfast.    Historical Provider, MD  Omega-3 Fatty Acids (FISH  OIL) 1200 MG CAPS Take 1 capsule by mouth daily.    Historical Provider, MD  ondansetron (ZOFRAN) 4 MG tablet  03/20/15   Historical Provider, MD  simvastatin (ZOCOR) 20 MG tablet TAKE ONE TABLET BY MOUTH IN THE EVENING 05/20/15   Lindsay Rana, MD  sucralfate (CARAFATE) 1 G tablet  03/23/15   Historical Provider, MD    Allergies as of 05/05/2015 - Review Complete 05/05/2015  Allergen Reaction Noted  . Aspirin Nausea Only 10/11/2011  . Hydrochlorothiazide  01/06/2015  . Lisinopril  01/07/2015  . Sulfa antibiotics Hives and Nausea Only 10/11/2011    Family History  Problem Relation Age of Onset  . Hypertension Mother     Social History   Social History  . Marital Status: Single    Spouse Name: N/A  . Number of Children: N/A  . Years of Education: N/A   Occupational History  . Not on file.   Social History Main Topics  . Smoking status: Never Smoker   . Smokeless tobacco: Never Used  . Alcohol Use: No  . Drug Use: No  . Sexual Activity: Not on file   Other Topics Concern  . Not on file   Social History Narrative    Review of Systems: See HPI, otherwise negative ROS  Physical Exam: BP 145/79 mmHg  Pulse 64  Temp(Src) 98.3 F (36.8 C) (Tympanic)  Resp 18  Ht 5\' 5"  (  1.651 m)  Wt 152 lb (68.947 kg)  BMI 25.29 kg/m2  SpO2 100% General:   Alert,  pleasant and cooperative in NAD Head:  Normocephalic and atraumatic. Neck:  Supple; no masses or thyromegaly. Lungs:  Clear throughout to auscultation.    Heart:  Regular rate and rhythm. Abdomen:  Soft, nontender and nondistended. Normal bowel sounds, without guarding, and without rebound.   Neurologic:  Alert and  oriented x4;  grossly normal neurologically.  Impression/Plan: [name] is here for an endoscopy to be performed for epigastric pain.  Risks, benefits, limitations, and alternatives regarding  endoscopy have been reviewed with the patient.  Questions have been answered.  All parties  agreeable.   Lindsay Bowl, MD  06/23/2015, 8:02 AM

## 2015-06-23 NOTE — Anesthesia Preprocedure Evaluation (Signed)
Anesthesia Evaluation  Patient identified by MRN, date of birth, ID band Patient awake    Reviewed: Allergy & Precautions, H&P , NPO status , Patient's Chart, lab work & pertinent test results, reviewed documented beta blocker date and time   Airway Mallampati: II  TM Distance: >3 FB Neck ROM: full    Dental no notable dental hx. (+) Teeth Intact   Pulmonary neg pulmonary ROS,    Pulmonary exam normal breath sounds clear to auscultation       Cardiovascular Exercise Tolerance: Good hypertension, negative cardio ROS  + dysrhythmias  Rhythm:regular Rate:Normal     Neuro/Psych  Headaches, negative neurological ROS  negative psych ROS   GI/Hepatic negative GI ROS, Neg liver ROS, GERD  ,  Endo/Other  negative endocrine ROS  Renal/GU negative Renal ROS  negative genitourinary   Musculoskeletal   Abdominal   Peds  Hematology negative hematology ROS (+) anemia ,   Anesthesia Other Findings   Reproductive/Obstetrics negative OB ROS                             Anesthesia Physical Anesthesia Plan  ASA: III  Anesthesia Plan: General   Post-op Pain Management:    Induction:   Airway Management Planned:   Additional Equipment:   Intra-op Plan:   Post-operative Plan:   Informed Consent: I have reviewed the patients History and Physical, chart, labs and discussed the procedure including the risks, benefits and alternatives for the proposed anesthesia with the patient or authorized representative who has indicated his/her understanding and acceptance.   Dental Advisory Given  Plan Discussed with: CRNA  Anesthesia Plan Comments:         Anesthesia Quick Evaluation

## 2015-06-24 ENCOUNTER — Encounter: Payer: Self-pay | Admitting: Gastroenterology

## 2015-06-25 LAB — SURGICAL PATHOLOGY

## 2015-08-27 DIAGNOSIS — M542 Cervicalgia: Secondary | ICD-10-CM | POA: Diagnosis not present

## 2015-08-27 DIAGNOSIS — M5137 Other intervertebral disc degeneration, lumbosacral region: Secondary | ICD-10-CM | POA: Diagnosis not present

## 2015-08-27 DIAGNOSIS — M5023 Other cervical disc displacement, cervicothoracic region: Secondary | ICD-10-CM | POA: Diagnosis not present

## 2015-09-08 ENCOUNTER — Encounter: Payer: Self-pay | Admitting: Family Medicine

## 2015-09-08 ENCOUNTER — Ambulatory Visit (INDEPENDENT_AMBULATORY_CARE_PROVIDER_SITE_OTHER): Payer: Commercial Managed Care - HMO | Admitting: Family Medicine

## 2015-09-08 VITALS — BP 122/68 | HR 68 | Temp 98.9°F | Resp 16 | Ht 65.0 in | Wt 166.0 lb

## 2015-09-08 DIAGNOSIS — E559 Vitamin D deficiency, unspecified: Secondary | ICD-10-CM | POA: Diagnosis not present

## 2015-09-08 DIAGNOSIS — E7800 Pure hypercholesterolemia, unspecified: Secondary | ICD-10-CM | POA: Diagnosis not present

## 2015-09-08 DIAGNOSIS — Z1239 Encounter for other screening for malignant neoplasm of breast: Secondary | ICD-10-CM | POA: Diagnosis not present

## 2015-09-08 DIAGNOSIS — I10 Essential (primary) hypertension: Secondary | ICD-10-CM

## 2015-09-08 DIAGNOSIS — Z0000 Encounter for general adult medical examination without abnormal findings: Secondary | ICD-10-CM

## 2015-09-08 DIAGNOSIS — J019 Acute sinusitis, unspecified: Secondary | ICD-10-CM | POA: Diagnosis not present

## 2015-09-08 MED ORDER — AMOXICILLIN-POT CLAVULANATE 875-125 MG PO TABS: 1.0000 | ORAL_TABLET | Freq: Two times a day (BID) | ORAL | Status: DC

## 2015-09-08 NOTE — Progress Notes (Signed)
Patient ID: [name], female   DOB: 1956/07/10, 60 y.o.   MRN: [id number]         Patient: [name], Female    DOB: Dec 10, 1955, 60 y.o.   MRN: [id number] Visit Date: 09/08/2015  Today's Provider: Margarita Rana, MD   Chief Complaint  Patient presents with  . Annual Exam  . Sinusitis   Subjective:    Annual wellness visit [name] is a 60 y.o. female. She feels fairly well. [facility] She reports she is sleeping fairly well. Not recently secondary to sinus pain and chest congestion. -----------------------------------------------------------   Sinusitis This is a new problem. The current episode started in the past 7 days. The problem is unchanged. There has been no fever. Associated symptoms include congestion, ear pain (Left ear pain), shortness of breath and sinus pressure. Pertinent negatives include no chills, coughing, diaphoresis, headaches, hoarse voice, neck pain, sneezing, sore throat or swollen glands. Past treatments include oral decongestants. The treatment provided no relief.    Review of Systems  Constitutional: Positive for fatigue. Negative for fever, chills, diaphoresis, activity change, appetite change and unexpected weight change.  HENT: Positive for congestion, ear pain (Left ear pain) and sinus pressure. Negative for dental problem, drooling, ear discharge, facial swelling, hearing loss, hoarse voice, mouth sores, nosebleeds, postnasal drip, rhinorrhea, sneezing, sore throat, tinnitus, trouble swallowing and voice change.   Eyes: Positive for itching. Negative for photophobia, pain, discharge, redness and visual disturbance.  Respiratory: Positive for chest tightness and shortness of breath. Negative for apnea, cough and wheezing.   Cardiovascular: Negative.   Gastrointestinal: Negative for nausea, vomiting, abdominal pain, diarrhea, constipation, blood in stool, abdominal distention, anal bleeding and rectal  pain.  Endocrine: Negative.   Genitourinary: Negative.   Musculoskeletal: Positive for arthralgias. Negative for myalgias, back pain, joint swelling, gait problem, neck pain and neck stiffness.  Skin: Negative.   Allergic/Immunologic: Negative.   Neurological: Negative for dizziness, tremors, seizures, syncope, facial asymmetry, speech difficulty, weakness, light-headedness, numbness and headaches.  Hematological: Negative for adenopathy. Does not bruise/bleed easily.  Psychiatric/Behavioral: Negative.     Social History   Social History  . Marital Status: Single    Spouse Name: N/A  . Number of Children: N/A  . Years of Education: N/A   Occupational History  . Not on file.   Social History Main Topics  . Smoking status: Never Smoker   . Smokeless tobacco: Never Used  . Alcohol Use: No  . Drug Use: No  . Sexual Activity: Not on file   Other Topics Concern  . Not on file   Social History Narrative    Past Medical History  Diagnosis Date  . Hypertension     takes Amlodipine daily  . Anemia     takes Ferrous Sulfate daily  . Arthritis   . Low back pain     occasionally  . GERD (gastroesophageal reflux disease)     takes OTC med  . Allergy   . Hyperlipidemia      Patient Active Problem List   Diagnosis Date Noted  . Abdominal pain, epigastric   . Gastritis   . Epigastric pain 03/24/2015  . BPPV (benign paroxysmal positional vertigo) 03/24/2015  . Right hip pain 01/07/2015  . Low back pain 01/07/2015  . Absolute anemia 01/06/2015  . Body mass index (BMI) of 28.0-28.9 in adult 01/06/2015  . Cervical disc disease 01/06/2015  . Atypical chest  pain 01/06/2015  . Acute constipation 01/06/2015  . Diverticulitis 01/06/2015  . Generalized pruritus 01/06/2015  . Acid reflux 01/06/2015  . Cervicogenic headache 01/06/2015  . Hypercholesteremia 01/06/2015  . High potassium 01/06/2015  . Acute labyrinthitis 01/06/2015  . LBP (low back pain) 01/06/2015  .  Pseudoarthrosis of cervical spine ([place]) 08/26/2013  . Accumulation of fluid in tissues 12/24/2009  . Avitaminosis D 10/15/2009  . Cardiac conduction disorder 09/30/2008  . Essential (primary) hypertension 09/30/2008  . Blood in the urine 09/30/2008    Past Surgical History  Procedure Laterality Date  . Neck surgery    . Tubal ligation    . Anterior cervical decomp/discectomy fusion  10/19/2011    Procedure: ANTERIOR CERVICAL DECOMPRESSION/DISCECTOMY FUSION 1 LEVEL/HARDWARE REMOVAL;  Surgeon: Elaina Hoops, MD;  Location: [place] [facility];  Service: Neurosurgery;  Laterality: N/A;  Cervical four - five  Anterior cervical decompression fusion with redo at six - seven  Rm 33  . Posterior cervical fusion/foraminotomy N/A 08/26/2013    Procedure: C/4-5,C/5-6,C/6-7 Posterior Cervical Fusion w/lateral mass fixation;  Surgeon: Elaina Hoops, MD;  Location: [place] [facility];  Service: Neurosurgery;  Laterality: N/A;  . Colonoscopy  08/2014    polyps  . Esophagogastroduodenoscopy (egd) with propofol N/A 06/23/2015    Procedure: ESOPHAGOGASTRODUODENOSCOPY (EGD) WITH PROPOFOL;  Surgeon: [name], MD;  Location: [facility];  Service: Endoscopy;  Laterality: N/A;    Her family history includes Hypertension in her mother.    Previous Medications   AMLODIPINE (NORVASC) 10 MG TABLET    Take 1 tablet (10 mg total) by mouth daily.   CHOLECALCIFEROL (VITAMIN D) 2000 UNITS TABLET    Take 2,000 Units by mouth daily.   DEXLANSOPRAZOLE (DEXILANT) 60 MG CAPSULE    Take 1 capsule (60 mg total) by mouth daily.   FERROUS SULFATE 325 (65 FE) MG TABLET    Take 325 mg by mouth daily with breakfast.   MELOXICAM (MOBIC) 7.5 MG TABLET       OMEGA-3 FATTY ACIDS (FISH OIL) 1200 MG CAPS    Take 1 capsule by mouth daily.   SIMVASTATIN (ZOCOR) 20 MG TABLET    TAKE ONE TABLET BY MOUTH IN THE EVENING    Patient Care Team: Margarita Rana, MD as PCP - General (Family Medicine)     Objective:   Vitals: BP 122/68 mmHg  Pulse  68  Temp(Src) 98.9 F (37.2 C) (Oral)  Resp 16  Ht 5\' 5"  (1.651 m)  Wt 166 lb (75.297 kg)  BMI 27.62 kg/m2  Physical Exam  Constitutional: She appears well-developed and well-nourished.  HENT:  Head: Normocephalic and atraumatic.  Right Ear: External ear normal.  Left Ear: External ear normal.  Nose: Nose normal.  Mouth/Throat: Oropharynx is clear and moist.  Eyes: Conjunctivae and EOM are normal. Pupils are equal, round, and reactive to light.  Neck: Normal range of motion. Neck supple.  Cardiovascular: Normal rate, regular rhythm and normal heart sounds.   Pulmonary/Chest: Effort normal and breath sounds normal.  Abdominal: Soft. Bowel sounds are normal.  Genitourinary: No breast swelling, tenderness, discharge or bleeding.    Activities of Daily Living In your present state of health, do you have any difficulty performing the following activities: 09/08/2015 01/07/2015  Hearing? N N  Vision? N N  Difficulty concentrating or making decisions? N N  Walking or climbing stairs? N N  Dressing or bathing? N N  Doing errands, shopping? N N    Fall Risk Assessment Fall Risk  09/08/2015 01/07/2015  Falls in the past year? No No     Depression Screen PHQ 2/9 Scores 09/08/2015 01/07/2015  PHQ - 2 Score 1 1    Cognitive Testing - 6-CIT  Correct? Score   What year is it? yes 0 0 or 4  What month is it? yes 0 0 or 3  Memorize:    Pia Mau,  42,  [address]  [place],      What time is it? (within 1 hour) yes 0 0 or 3  Count backwards from 20 yes 0 0, 2, or 4  Name the months of the year yes 0 0, 2, or 4  Repeat name & address above no 6 0, 2, 4, 6, 8, or 10       TOTAL SCORE  6/28   Interpretation:  Normal  Normal (0-7) Abnormal (8-28)       Assessment & Plan:     Annual Wellness Visit  Reviewed patient's Family Medical History Reviewed and updated list of patient's medical providers Assessment of cognitive impairment was done Assessed patient's functional  ability Established a written schedule for health screening [place] Completed and Reviewed  Exercise Activities and Dietary recommendations Goals    None      Immunization History  Administered Date(s) Administered  . Tdap 09/30/2008    Health Maintenance  Topic Date Due  . Hepatitis C Screening  August 11, 1955  . HIV Screening  04/16/1971  . MAMMOGRAM  02/21/2015  . INFLUENZA VACCINE  02/23/2016 (Originally 02/23/2015)  . PAP SMEAR  01/17/2016  . TETANUS/TDAP  10/01/2018  . COLONOSCOPY  10/27/2024      Discussed health benefits of physical activity, and encouraged her to engage in regular exercise appropriate for her age and condition.   1. Medicare annual wellness visit, subsequent Eat healthy and exercise.   2. Acute sinusitis, recurrence not specified, unspecified location New problem. Worsening. Start medication.   - amoxicillin-clavulanate (AUGMENTIN) 875-125 MG tablet; Take 1 tablet by mouth 2 (two) times daily.  Dispense: 20 tablet; Refill: 0  3. Breast cancer screening - MM Digital Screening  4. Essential (primary) hypertension Condition is stable. Please continue current medication and  plan of care as noted.   - Comprehensive metabolic panel - TSH  5. Hypercholesteremia Check labs.  - Lipid panel  6. Avitaminosis D Stable.  - CBC with Differential/Platelet - VITAMIN D 25 Hydroxy (Vit-D Deficiency, Fractures)   Patient was seen and examined by Jerrell Belfast, MD, and note scribed by Ashley Royalty, CMA.  I have reviewed the document for accuracy and completeness and I agree with above. Jerrell Belfast, MD   Margarita Rana, MD   .   ------------------------------------------------------------------------------------------------------------

## 2015-09-17 ENCOUNTER — Telehealth: Payer: Self-pay

## 2015-09-17 ENCOUNTER — Other Ambulatory Visit
Admission: RE | Admit: 2015-09-17 | Discharge: 2015-09-17 | Disposition: A | Discharge status: Home / Self Care | Payer: Commercial Managed Care - HMO | Source: Ambulatory Visit | Attending: Family Medicine | Admitting: Family Medicine

## 2015-09-17 ENCOUNTER — Telehealth: Payer: Self-pay | Admitting: Family Medicine

## 2015-09-17 ENCOUNTER — Other Ambulatory Visit: Payer: Self-pay

## 2015-09-17 DIAGNOSIS — E7800 Pure hypercholesterolemia, unspecified: Secondary | ICD-10-CM | POA: Diagnosis not present

## 2015-09-17 DIAGNOSIS — Z1231 Encounter for screening mammogram for malignant neoplasm of breast: Secondary | ICD-10-CM

## 2015-09-17 DIAGNOSIS — E559 Vitamin D deficiency, unspecified: Secondary | ICD-10-CM | POA: Insufficient documentation

## 2015-09-17 DIAGNOSIS — I10 Essential (primary) hypertension: Secondary | ICD-10-CM | POA: Diagnosis not present

## 2015-09-17 LAB — CBC WITH DIFFERENTIAL/PLATELET
Basophils Absolute: 0.1 10*3/uL (ref 0–0.1)
Basophils Relative: 1 %
EOS PCT: 1 %
Eosinophils Absolute: 0.1 10*3/uL (ref 0–0.7)
HEMATOCRIT: 40.8 % (ref 35.0–47.0)
Hemoglobin: 13.8 g/dL (ref 12.0–16.0)
LYMPHS ABS: 1.9 10*3/uL (ref 1.0–3.6)
LYMPHS PCT: 29 %
MCH: 27.6 pg (ref 26.0–34.0)
MCHC: 33.9 g/dL (ref 32.0–36.0)
MCV: 81.4 fL (ref 80.0–100.0)
MONO ABS: 0.5 10*3/uL (ref 0.2–0.9)
Monocytes Relative: 8 %
NEUTROS ABS: 4 10*3/uL (ref 1.4–6.5)
Neutrophils Relative %: 61 %
PLATELETS: 292 10*3/uL (ref 150–440)
RBC: 5.01 MIL/uL (ref 3.80–5.20)
RDW: 13.2 % (ref 11.5–14.5)
WBC: 6.6 10*3/uL (ref 3.6–11.0)

## 2015-09-17 LAB — COMPREHENSIVE METABOLIC PANEL
ALT: 9 U/L — AB (ref 14–54)
AST: 19 U/L (ref 15–41)
Albumin: 4.3 g/dL (ref 3.5–5.0)
Alkaline Phosphatase: 112 U/L (ref 38–126)
Anion gap: 8 (ref 5–15)
BILIRUBIN TOTAL: 0.2 mg/dL — AB (ref 0.3–1.2)
BUN: 9 mg/dL (ref 6–20)
CALCIUM: 9.1 mg/dL (ref 8.9–10.3)
CHLORIDE: 107 mmol/L (ref 101–111)
CO2: 27 mmol/L (ref 22–32)
CREATININE: 0.61 mg/dL (ref 0.44–1.00)
Glucose, Bld: 101 mg/dL — ABNORMAL HIGH (ref 65–99)
Potassium: 3.6 mmol/L (ref 3.5–5.1)
Sodium: 142 mmol/L (ref 135–145)
TOTAL PROTEIN: 7.9 g/dL (ref 6.5–8.1)

## 2015-09-17 LAB — LIPID PANEL
CHOLESTEROL: 164 mg/dL (ref 0–200)
HDL: 45 mg/dL (ref >40)
LDL Cholesterol: 98 mg/dL (ref 0–99)
TRIGLYCERIDES: 104 mg/dL (ref <150)
Total CHOL/HDL Ratio: 3.6 RATIO
VLDL: 21 mg/dL (ref 0–40)

## 2015-09-17 LAB — TSH: TSH: 0.848 u[IU]/mL (ref 0.350–4.500)

## 2015-09-17 MED ORDER — AZITHROMYCIN 250 MG PO TABS: ORAL_TABLET | ORAL | Status: DC

## 2015-09-17 NOTE — Telephone Encounter (Signed)
Please clarify if coughing up productive sputum and feels sick.  If so, can try Zpak. If not, recommend Flonase and Zyrtec to see if allergy symptoms. Can also see patient first if that would be better.Thanks.

## 2015-09-17 NOTE — Telephone Encounter (Signed)
-----   Message from Margarita Rana, MD sent at 09/17/2015  4:33 PM EST ----- Labs stable.  Please notify patient. Thanks.

## 2015-09-17 NOTE — Telephone Encounter (Signed)
It was Augmentin 875 mg BID x 10 days. Please advise. Renaldo Fiddler, CMA

## 2015-09-17 NOTE — Telephone Encounter (Signed)
Pt advised.   Thanks,   -Ihsan Nomura  

## 2015-09-17 NOTE — Telephone Encounter (Signed)
Pt states she was in on 09/08/2015 and rec'd a Rx for a cough.  Pt states she is still coughing and still has congestion.  [place]  CB#[phone]/[id number]

## 2015-09-17 NOTE — Telephone Encounter (Signed)
Pt reports her cough is productive of white sputum, chest pain/congestion, nasal congestion, chills. Renaldo Fiddler, CMA

## 2015-09-18 ENCOUNTER — Emergency Department: Payer: Commercial Managed Care - HMO

## 2015-09-18 ENCOUNTER — Encounter: Payer: Self-pay | Admitting: Emergency Medicine

## 2015-09-18 ENCOUNTER — Emergency Department
Admission: EM | Admit: 2015-09-18 | Discharge: 2015-09-18 | Disposition: A | Discharge status: Home / Self Care | Payer: Commercial Managed Care - HMO | Attending: Emergency Medicine | Admitting: Emergency Medicine

## 2015-09-18 DIAGNOSIS — Z792 Long term (current) use of antibiotics: Secondary | ICD-10-CM | POA: Diagnosis not present

## 2015-09-18 DIAGNOSIS — G8929 Other chronic pain: Secondary | ICD-10-CM | POA: Diagnosis not present

## 2015-09-18 DIAGNOSIS — R05 Cough: Secondary | ICD-10-CM | POA: Diagnosis not present

## 2015-09-18 DIAGNOSIS — I10 Essential (primary) hypertension: Secondary | ICD-10-CM | POA: Insufficient documentation

## 2015-09-18 DIAGNOSIS — Z79899 Other long term (current) drug therapy: Secondary | ICD-10-CM | POA: Diagnosis not present

## 2015-09-18 DIAGNOSIS — R091 Pleurisy: Secondary | ICD-10-CM

## 2015-09-18 DIAGNOSIS — R1013 Epigastric pain: Secondary | ICD-10-CM | POA: Diagnosis not present

## 2015-09-18 DIAGNOSIS — R0789 Other chest pain: Secondary | ICD-10-CM | POA: Diagnosis not present

## 2015-09-18 DIAGNOSIS — R0600 Dyspnea, unspecified: Secondary | ICD-10-CM | POA: Diagnosis not present

## 2015-09-18 DIAGNOSIS — R079 Chest pain, unspecified: Secondary | ICD-10-CM | POA: Diagnosis not present

## 2015-09-18 DIAGNOSIS — R110 Nausea: Secondary | ICD-10-CM | POA: Insufficient documentation

## 2015-09-18 DIAGNOSIS — Z791 Long term (current) use of non-steroidal anti-inflammatories (NSAID): Secondary | ICD-10-CM | POA: Diagnosis not present

## 2015-09-18 LAB — CBC
HCT: 38.7 % (ref 35.0–47.0)
Hemoglobin: 13.2 g/dL (ref 12.0–16.0)
MCH: 27.7 pg (ref 26.0–34.0)
MCHC: 34 g/dL (ref 32.0–36.0)
MCV: 81.3 fL (ref 80.0–100.0)
PLATELETS: 282 10*3/uL (ref 150–440)
RBC: 4.77 MIL/uL (ref 3.80–5.20)
RDW: 13.2 % (ref 11.5–14.5)
WBC: 6 10*3/uL (ref 3.6–11.0)

## 2015-09-18 LAB — VITAMIN D 25 HYDROXY (VIT D DEFICIENCY, FRACTURES): VIT D 25 HYDROXY: 32.6 ng/mL (ref 30.0–100.0)

## 2015-09-18 LAB — BASIC METABOLIC PANEL
Anion gap: 6 (ref 5–15)
BUN: 10 mg/dL (ref 6–20)
CHLORIDE: 105 mmol/L (ref 101–111)
CO2: 26 mmol/L (ref 22–32)
CREATININE: 0.79 mg/dL (ref 0.44–1.00)
Calcium: 9.1 mg/dL (ref 8.9–10.3)
Glucose, Bld: 109 mg/dL — ABNORMAL HIGH (ref 65–99)
POTASSIUM: 3.6 mmol/L (ref 3.5–5.1)
SODIUM: 137 mmol/L (ref 135–145)

## 2015-09-18 LAB — HEPATIC FUNCTION PANEL
ALBUMIN: 3.8 g/dL (ref 3.5–5.0)
ALT: 9 U/L — ABNORMAL LOW (ref 14–54)
AST: 21 U/L (ref 15–41)
Alkaline Phosphatase: 110 U/L (ref 38–126)
Bilirubin, Direct: <0.1 mg/dL — ABNORMAL LOW (ref 0.1–0.5)
TOTAL PROTEIN: 7.3 g/dL (ref 6.5–8.1)
Total Bilirubin: <0.1 mg/dL — ABNORMAL LOW (ref 0.3–1.2)

## 2015-09-18 LAB — TROPONIN I: Troponin I: <0.03 ng/mL (ref <0.031)

## 2015-09-18 LAB — LIPASE, BLOOD: LIPASE: 26 U/L (ref 11–51)

## 2015-09-18 MED ORDER — LEVOFLOXACIN 750 MG PO TABS
750.0000 mg | ORAL_TABLET | Freq: Once | ORAL | Status: AC
  Administered 2015-09-18: 750 mg via ORAL
  Filled 2015-09-18: qty 1

## 2015-09-18 MED ORDER — LEVOFLOXACIN 750 MG PO TABS: 750.0000 mg | ORAL_TABLET | Freq: Every day | ORAL | Status: DC

## 2015-09-18 MED ORDER — ONDANSETRON 4 MG PO TBDP
4.0000 mg | ORAL_TABLET | Freq: Three times a day (TID) | ORAL | Status: DC | PRN
Start: 2015-09-18 — End: 2015-09-25

## 2015-09-18 MED ORDER — PREDNISONE 20 MG PO TABS
40.0000 mg | ORAL_TABLET | ORAL | Status: AC
  Administered 2015-09-18: 40 mg via ORAL
  Filled 2015-09-18: qty 2

## 2015-09-18 MED ORDER — RANITIDINE HCL 150 MG PO CAPS: 150.0000 mg | ORAL_CAPSULE | Freq: Two times a day (BID) | ORAL | Status: DC

## 2015-09-18 MED ORDER — GI COCKTAIL ~~LOC~~
30.0000 mL | ORAL | Status: AC
  Administered 2015-09-18: 30 mL via ORAL
  Filled 2015-09-18: qty 30

## 2015-09-18 NOTE — ED Notes (Signed)
Patient reports chest tightness, chill and nausea times two weeks. Patient states that she was seen by pcp on the 14th and started on amoxicillin. Patient states that she has had no improvement.

## 2015-09-18 NOTE — Discharge Instructions (Signed)

## 2015-09-18 NOTE — ED Notes (Signed)
None of previous LDA's present on assessment

## 2015-09-18 NOTE — ED Provider Notes (Signed)
[facility] Emergency Department Provider Note  ____________________________________________  Time seen: 9:45 PM  I have reviewed the triage vital signs and the nursing notes.   HISTORY  Chief Complaint Chest Pain; Chills; and Nausea    HPI [name] is a 60 y.o. female who complains of diffuse chest tightness, chills nausea and occasional shortness of breath for the past 2 or 3 weeks. She has seen her primary care doctor is given her courses of Augmentin and azithromycin. As any cough. No headache or fever or dizziness or syncope. She is eating and drinking normally. Denies vomiting or diarrhea.     Past Medical History  Diagnosis Date  . Hypertension     takes Amlodipine daily  . Anemia     takes Ferrous Sulfate daily  . Arthritis   . Low back pain     occasionally  . GERD (gastroesophageal reflux disease)     takes OTC med  . Allergy   . Hyperlipidemia      Patient Active Problem List   Diagnosis Date Noted  . Abdominal pain, epigastric   . Gastritis   . Epigastric pain 03/24/2015  . BPPV (benign paroxysmal positional vertigo) 03/24/2015  . Right hip pain 01/07/2015  . Low back pain 01/07/2015  . Absolute anemia 01/06/2015  . Body mass index (BMI) of 28.0-28.9 in adult 01/06/2015  . Cervical disc disease 01/06/2015  . Atypical chest pain 01/06/2015  . Acute constipation 01/06/2015  . Diverticulitis 01/06/2015  . Generalized pruritus 01/06/2015  . Acid reflux 01/06/2015  . Cervicogenic headache 01/06/2015  . Hypercholesteremia 01/06/2015  . High potassium 01/06/2015  . Acute labyrinthitis 01/06/2015  . LBP (low back pain) 01/06/2015  . Pseudoarthrosis of cervical spine ([place]) 08/26/2013  . Accumulation of fluid in tissues 12/24/2009  . Avitaminosis D 10/15/2009  . Cardiac conduction disorder 09/30/2008  . Essential (primary) hypertension 09/30/2008  . Blood in the urine 09/30/2008     Past Surgical History  Procedure  Laterality Date  . Neck surgery    . Tubal ligation    . Anterior cervical decomp/discectomy fusion  10/19/2011    Procedure: ANTERIOR CERVICAL DECOMPRESSION/DISCECTOMY FUSION 1 LEVEL/HARDWARE REMOVAL;  Surgeon: Elaina Hoops, MD;  Location: [place] [facility] ORS;  Service: Neurosurgery;  Laterality: N/A;  Cervical four - five  Anterior cervical decompression fusion with redo at six - seven  Rm 33  . Posterior cervical fusion/foraminotomy N/A 08/26/2013    Procedure: C/4-5,C/5-6,C/6-7 Posterior Cervical Fusion w/lateral mass fixation;  Surgeon: Elaina Hoops, MD;  Location: [place] NEURO ORS;  Service: Neurosurgery;  Laterality: N/A;  . Colonoscopy  08/2014    polyps  . Esophagogastroduodenoscopy (egd) with propofol N/A 06/23/2015    Procedure: ESOPHAGOGASTRODUODENOSCOPY (EGD) WITH PROPOFOL;  Surgeon: [name], MD;  Location: [facility] ENDOSCOPY;  Service: Endoscopy;  Laterality: N/A;     Current Outpatient Rx  Name  Route  Sig  Dispense  Refill  . amLODipine (NORVASC) 10 MG tablet   Oral   Take 1 tablet (10 mg total) by mouth daily.   30 tablet   6   . amoxicillin-clavulanate (AUGMENTIN) 875-125 MG tablet   Oral   Take 1 tablet by mouth 2 (two) times daily.   20 tablet   0   . Cholecalciferol (VITAMIN D) 2000 UNITS tablet   Oral   Take 2,000 Units by mouth daily.         Marland Kitchen dexlansoprazole (DEXILANT) 60 MG capsule   Oral  Take 1 capsule (60 mg total) by mouth daily.   30 capsule   5   . ferrous sulfate 325 (65 FE) MG tablet   Oral   Take 325 mg by mouth daily with breakfast.         . meloxicam (MOBIC) 7.5 MG tablet   Oral   Take 7.5 mg by mouth daily.          . Omega-3 Fatty Acids (FISH OIL) 1200 MG CAPS   Oral   Take 1 capsule by mouth daily.         . simvastatin (ZOCOR) 20 MG tablet      TAKE ONE TABLET BY MOUTH IN THE EVENING   30 tablet   5   . levofloxacin (LEVAQUIN) 750 MG tablet   Oral   Take 1 tablet (750 mg total) by mouth daily.   7 tablet   0   .  ondansetron (ZOFRAN ODT) 4 MG disintegrating tablet   Oral   Take 1 tablet (4 mg total) by mouth every 8 (eight) hours as needed for nausea or vomiting.   20 tablet   0   . ranitidine (ZANTAC) 150 MG capsule   Oral   Take 1 capsule (150 mg total) by mouth 2 (two) times daily.   28 capsule   0      Allergies Aspirin; Hydrochlorothiazide; Lisinopril; and Sulfa antibiotics   Family History  Problem Relation Age of Onset  . Hypertension Mother     Social History Social History  Substance Use Topics  . Smoking status: Never Smoker   . Smokeless tobacco: Never Used  . Alcohol Use: No    Review of Systems  Constitutional:   No fever or chills. No weight changes Eyes:   No blurry vision or double vision.  ENT:   No sore throat.  Cardiovascular:   Positive chronic tightness chest pain. Respiratory:   Positive occasional dyspnea without cough. Gastrointestinal:   Negative for abdominal pain, vomiting and diarrhea.  No BRBPR or melena. Genitourinary:   Negative for dysuria or difficulty urinating. Musculoskeletal:   Negative for back pain. No joint swelling or pain. Skin:   Negative for rash. Neurological:   Negative for headaches, focal weakness or numbness. Psychiatric:  No anxiety or depression.   Endocrine:  No changes in energy or sleep difficulty.  10-point ROS otherwise negative.  ____________________________________________   PHYSICAL EXAM:  VITAL SIGNS: ED Triage Vitals  Enc Vitals Group     BP 09/18/15 2031 142/74 mmHg     Pulse Rate 09/18/15 2031 75     Resp 09/18/15 2031 18     Temp 09/18/15 2031 98.4 F (36.9 C)     Temp Source 09/18/15 2031 Oral     SpO2 09/18/15 2031 95 %     Weight 09/18/15 2031 164 lb (74.39 kg)     Height 09/18/15 2031 5\' 5"  (1.651 m)     Head Cir --      Peak Flow --      Pain Score 09/18/15 2031 10     Pain Loc --      Pain Edu? --      Excl. in [place]? --     Vital signs reviewed, nursing assessments  reviewed.   Constitutional:   Alert and oriented. Well appearing and in no distress. Eyes:   No scleral icterus. No conjunctival pallor. PERRL. EOMI ENT   Head:   Normocephalic and atraumatic.   Nose:  No congestion/rhinnorhea. No septal hematoma   Mouth/Throat:   MMM, no pharyngeal erythema. No peritonsillar mass.    Neck:   No stridor. No SubQ emphysema. No meningismus. Hematological/Lymphatic/Immunilogical:   No cervical lymphadenopathy. Cardiovascular:   RRR. Symmetric bilateral radial and DP pulses.  No murmurs.  Respiratory:   Normal respiratory effort without tachypnea nor retractions. Breath sounds are clear and equal bilaterally. No wheezes/rales/rhonchi. Gastrointestinal:   Soft with epigastric tenderness. Non distended. There is no CVA tenderness.  No rebound, rigidity, or guarding. Genitourinary:   deferred Musculoskeletal:   Nontender with normal range of motion in all extremities. No joint effusions.  No lower extremity tenderness.  No edema. Chest wall nontender Neurologic:   Normal speech and language.  CN 2-10 normal. Motor grossly intact. No gross focal neurologic deficits are appreciated.  Skin:    Skin is warm, dry and intact. No rash noted.  No petechiae, purpura, or bullae. Psychiatric:   Mood and affect are normal. ____________________________________________    LABS (pertinent positives/negatives) (all labs ordered are listed, but only abnormal results are displayed) Labs Reviewed  BASIC METABOLIC PANEL - Abnormal; Notable for the following:    Glucose, Bld 109 (*)    All other components within normal limits  HEPATIC FUNCTION PANEL - Abnormal; Notable for the following:    ALT 9 (*)    Total Bilirubin <0.1 (*)    Bilirubin, Direct <0.1 (*)    All other components within normal limits  CBC  TROPONIN I  LIPASE, BLOOD   ____________________________________________   EKG  Interpreted by me Sinus rhythm rate of 65, normal axis and  intervals. Normal QRS. Normal ST segments and T waves. Overall normal EKG.  ____________________________________________    RADIOLOGY  Chest x-ray unremarkable  ____________________________________________   PROCEDURES   ____________________________________________   INITIAL IMPRESSION / ASSESSMENT AND PLAN / ED COURSE  Pertinent labs & imaging results that were available during my care of the patient were reviewed by me and considered in my medical decision making (see chart for details).  Patient presents with subacute shortness of breath and chest pain. Has had a few antibiotic courses per primary care. Well appearing no acute distress.Considering the patient's symptoms, medical history, and physical examination today, I have low suspicion for ACS, PE, TAD, pneumothorax, carditis, mediastinitis, pneumonia, CHF, or sepsis.  Labs are unremarkable as is x-ray and EKG. I'll go ahead and switch her to a course of Levaquin in case this is an atypical infection not managed by her other antibiotics already. Gave her a one-time dose of prednisone in the emergency department as well. For the epigastric pain have low suspicion for any biliary pathology perforation obstruction or pancreatitis. We'll start her on a course of antacids for symptom relief. Follow up closely with primary care.     ____________________________________________   FINAL CLINICAL IMPRESSION(S) / ED DIAGNOSES  Final diagnoses:  Abdominal pain, epigastric  Pleurisy      Carrie Mew, MD 09/18/15 2249

## 2015-09-22 ENCOUNTER — Telehealth: Payer: Self-pay | Admitting: Family Medicine

## 2015-09-22 NOTE — Telephone Encounter (Signed)
Pt was discharged from [facility] ER on 09/18/2015 for abdominal pain.  I have scheduled a hospital follow up/MW

## 2015-09-22 NOTE — Telephone Encounter (Signed)
Left message for TIC call. Renaldo Fiddler, CMA

## 2015-09-22 NOTE — Telephone Encounter (Signed)
Patient reports that she has filled her medications and has started them as prescribed. Patient denies fever, vomiting and diarrhea. Patient reports that she will call if symptoms are not improving or worsening before follow-up on Friday. sd

## 2015-09-25 ENCOUNTER — Encounter: Payer: Self-pay | Admitting: Family Medicine

## 2015-09-25 ENCOUNTER — Ambulatory Visit (INDEPENDENT_AMBULATORY_CARE_PROVIDER_SITE_OTHER): Payer: Medicare HMO | Admitting: Family Medicine

## 2015-09-25 VITALS — BP 110/72 | HR 60 | Temp 98.1°F | Resp 16 | Wt 165.0 lb

## 2015-09-25 DIAGNOSIS — K2970 Gastritis, unspecified, without bleeding: Secondary | ICD-10-CM | POA: Diagnosis not present

## 2015-09-25 DIAGNOSIS — Z1159 Encounter for screening for other viral diseases: Secondary | ICD-10-CM | POA: Diagnosis not present

## 2015-09-25 MED ORDER — SUCRALFATE 1 G PO TABS: 1.0000 g | ORAL_TABLET | Freq: Three times a day (TID) | ORAL | Status: DC

## 2015-09-25 NOTE — Progress Notes (Signed)
Subjective:    Patient ID: [name], female    DOB: 1955/12/16, 60 y.o.   MRN: [id number]  HPI   Follow up ER visit  Patient was seen in ER for chest pain, nausea, chills on 09/18/2015. She was treated for Abdominal pain and pleurisy. Treatment for this included Levaquin, Zantac, Zofran. Labs, EKG, CXR negative. She reports excellent compliance with treatment. She reports this condition is Improved. Pt states this is 60% improved.  ------------------------------------------------------------------------------------    Review of Systems  Constitutional: Negative for fever, chills, diaphoresis, activity change, appetite change, fatigue and unexpected weight change.  Respiratory: Positive for cough (productive with white sputum) and wheezing. Negative for chest tightness and shortness of breath.   Cardiovascular: Positive for chest pain (improved from ED visit). Negative for palpitations and leg swelling.   BP 110/72 mmHg  Pulse 60  Temp(Src) 98.1 F (36.7 C) (Oral)  Resp 16  Wt 165 lb (74.844 kg)  SpO2 98%   Patient Active Problem List   Diagnosis Date Noted  . Abdominal pain, epigastric   . Gastritis   . Epigastric pain 03/24/2015  . BPPV (benign paroxysmal positional vertigo) 03/24/2015  . Right hip pain 01/07/2015  . Low back pain 01/07/2015  . Absolute anemia 01/06/2015  . Body mass index (BMI) of 28.0-28.9 in adult 01/06/2015  . Cervical disc disease 01/06/2015  . Atypical chest pain 01/06/2015  . Acute constipation 01/06/2015  . Diverticulitis 01/06/2015  . Generalized pruritus 01/06/2015  . Acid reflux 01/06/2015  . Cervicogenic headache 01/06/2015  . Hypercholesteremia 01/06/2015  . High potassium 01/06/2015  . Acute labyrinthitis 01/06/2015  . LBP (low back pain) 01/06/2015  . Pseudoarthrosis of cervical spine ([place]) 08/26/2013  . Accumulation of fluid in tissues 12/24/2009  . Avitaminosis D 10/15/2009  . Cardiac conduction disorder 09/30/2008    . Essential (primary) hypertension 09/30/2008  . Blood in the urine 09/30/2008   Past Medical History  Diagnosis Date  . Hypertension     takes Amlodipine daily  . Anemia     takes Ferrous Sulfate daily  . Arthritis   . Low back pain     occasionally  . GERD (gastroesophageal reflux disease)     takes OTC med  . Allergy   . Hyperlipidemia    Current Outpatient Prescriptions on File Prior to Visit  Medication Sig  . amLODipine (NORVASC) 10 MG tablet Take 1 tablet (10 mg total) by mouth daily.  . Cholecalciferol (VITAMIN D) 2000 UNITS tablet Take 2,000 Units by mouth daily.  Marland Kitchen dexlansoprazole (DEXILANT) 60 MG capsule Take 1 capsule (60 mg total) by mouth daily.  . ferrous sulfate 325 (65 FE) MG tablet Take 325 mg by mouth daily with breakfast.  . levofloxacin (LEVAQUIN) 750 MG tablet Take 1 tablet (750 mg total) by mouth daily.  . meloxicam (MOBIC) 7.5 MG tablet Take 7.5 mg by mouth daily.   . Omega-3 Fatty Acids (FISH OIL) 1200 MG CAPS Take 1 capsule by mouth daily.  . ranitidine (ZANTAC) 150 MG capsule Take 1 capsule (150 mg total) by mouth 2 (two) times daily.  . simvastatin (ZOCOR) 20 MG tablet TAKE ONE TABLET BY MOUTH IN THE EVENING   No current facility-administered medications on file prior to visit.   Allergies  Allergen Reactions  . Aspirin Nausea Only  . Hydrochlorothiazide     Rash  . Lisinopril     Other reaction(s): Unknown  . Sulfa Antibiotics Hives and Nausea Only   Past Surgical  History  Procedure Laterality Date  . Neck surgery    . Tubal ligation    . Anterior cervical decomp/discectomy fusion  10/19/2011    Procedure: ANTERIOR CERVICAL DECOMPRESSION/DISCECTOMY FUSION 1 LEVEL/HARDWARE REMOVAL;  Surgeon: Elaina Hoops, MD;  Location: [place] NEURO ORS;  Service: Neurosurgery;  Laterality: N/A;  Cervical four - five  Anterior cervical decompression fusion with redo at six - seven  Rm 33  . Posterior cervical fusion/foraminotomy N/A 08/26/2013    Procedure:  C/4-5,C/5-6,C/6-7 Posterior Cervical Fusion w/lateral mass fixation;  Surgeon: Elaina Hoops, MD;  Location: [place] NEUR[facility] ORS;  Service: Neurosurgery;  Laterality: N/A;  . Colonoscopy  08/2014    polyps  . Esophagogastroduodenoscopy (egd) with propofol N/A 06/23/2015    Procedure: ESOPHAGOGASTRODUODENOSCOPY (EGD) WITH PROPOFOL;  Surgeon: [name], MD;  Location: [facility] ENDOSCOPY;  Service: Endoscopy;  Laterality: N/A;   Social History   Social History  . Marital Status: Single    Spouse Name: N/A  . Number of Children: N/A  . Years of Education: N/A   Occupational History  . Not on file.   Social History Main Topics  . Smoking status: Never Smoker   . Smokeless tobacco: Never Used  . Alcohol Use: No  . Drug Use: No  . Sexual Activity: Not on file   Other Topics Concern  . Not on file   Social History Narrative   Family History  Problem Relation Age of Onset  . Hypertension Mother       Objective:   Physical Exam  Constitutional: She appears well-developed and well-nourished.  Cardiovascular: Normal rate, regular rhythm and normal heart sounds.   Pulmonary/Chest: Effort normal and breath sounds normal. No respiratory distress.  Abdominal: There is tenderness.  Psychiatric: She has a normal mood and affect. Her behavior is normal.   BP 110/72 mmHg  Pulse 60  Temp(Src) 98.1 F (36.7 C) (Oral)  Resp 16  Wt 165 lb (74.844 kg)  SpO2 98%     Assessment & Plan:  1. Need for hepatitis C screening test Will check today.  - Hepatitis C antibody  2. Gastritis Recurrent. Improved. Status post ER visit. Finish antibiotic and add Sucralfate short term and then taper off. Follow up with GI if does not improve.   - sucralfate (CARAFATE) 1 g tablet; Take 1 tablet (1 g total) by mouth 4 (four) times daily -  with meals and at bedtime. For 1 week, then BID for another week  Dispense: 120 tablet; Refill: 0   Patient was seen and examined by Jerrell Belfast, MD, and note scribed by  Renaldo Fiddler, CMA.  I have reviewed the document for accuracy and completeness and I agree with above. Jerrell Belfast, MD   Margarita Rana, MD

## 2015-10-16 ENCOUNTER — Ambulatory Visit
Admission: RE | Admit: 2015-10-16 | Discharge: 2015-10-16 | Disposition: A | Discharge status: Home / Self Care | Payer: Commercial Managed Care - HMO | Source: Ambulatory Visit | Attending: Family Medicine | Admitting: Family Medicine

## 2015-10-16 ENCOUNTER — Other Ambulatory Visit: Payer: Self-pay | Admitting: Family Medicine

## 2015-10-16 DIAGNOSIS — Z1231 Encounter for screening mammogram for malignant neoplasm of breast: Secondary | ICD-10-CM | POA: Diagnosis not present

## 2015-11-24 DIAGNOSIS — M542 Cervicalgia: Secondary | ICD-10-CM | POA: Diagnosis not present

## 2015-11-24 DIAGNOSIS — M5137 Other intervertebral disc degeneration, lumbosacral region: Secondary | ICD-10-CM | POA: Diagnosis not present

## 2015-12-08 ENCOUNTER — Ambulatory Visit: Payer: Commercial Managed Care - HMO | Admitting: Family Medicine

## 2015-12-16 ENCOUNTER — Other Ambulatory Visit
Admission: RE | Admit: 2015-12-16 | Discharge: 2015-12-16 | Disposition: A | Discharge status: Home / Self Care | Payer: Commercial Managed Care - HMO | Source: Ambulatory Visit | Attending: Family Medicine | Admitting: Family Medicine

## 2015-12-16 ENCOUNTER — Encounter: Payer: Self-pay | Admitting: Family Medicine

## 2015-12-16 ENCOUNTER — Ambulatory Visit (INDEPENDENT_AMBULATORY_CARE_PROVIDER_SITE_OTHER): Payer: Commercial Managed Care - HMO | Admitting: Family Medicine

## 2015-12-16 VITALS — BP 118/68 | HR 72 | Temp 98.5°F | Resp 16 | Wt 171.0 lb

## 2015-12-16 DIAGNOSIS — Z1159 Encounter for screening for other viral diseases: Secondary | ICD-10-CM | POA: Insufficient documentation

## 2015-12-16 DIAGNOSIS — R059 Cough, unspecified: Secondary | ICD-10-CM

## 2015-12-16 DIAGNOSIS — R05 Cough: Secondary | ICD-10-CM | POA: Diagnosis not present

## 2015-12-16 DIAGNOSIS — I10 Essential (primary) hypertension: Secondary | ICD-10-CM | POA: Insufficient documentation

## 2015-12-16 DIAGNOSIS — J309 Allergic rhinitis, unspecified: Secondary | ICD-10-CM | POA: Insufficient documentation

## 2015-12-16 LAB — CBC WITH DIFFERENTIAL/PLATELET
BASOS ABS: 0 10*3/uL (ref 0–0.1)
Basophils Relative: 0 %
Eosinophils Absolute: 0.1 10*3/uL (ref 0–0.7)
Eosinophils Relative: 1 %
HEMATOCRIT: 41.8 % (ref 35.0–47.0)
HEMOGLOBIN: 14 g/dL (ref 12.0–16.0)
LYMPHS PCT: 36 %
Lymphs Abs: 2.8 10*3/uL (ref 1.0–3.6)
MCH: 27.3 pg (ref 26.0–34.0)
MCHC: 33.5 g/dL (ref 32.0–36.0)
MCV: 81.4 fL (ref 80.0–100.0)
MONO ABS: 0.4 10*3/uL (ref 0.2–0.9)
MONOS PCT: 6 %
NEUTROS ABS: 4.4 10*3/uL (ref 1.4–6.5)
NEUTROS PCT: 57 %
Platelets: 350 10*3/uL (ref 150–440)
RBC: 5.13 MIL/uL (ref 3.80–5.20)
RDW: 13.4 % (ref 11.5–14.5)
WBC: 7.7 10*3/uL (ref 3.6–11.0)

## 2015-12-16 LAB — COMPREHENSIVE METABOLIC PANEL
ALK PHOS: 103 U/L (ref 38–126)
ALT: 8 U/L — ABNORMAL LOW (ref 14–54)
AST: 19 U/L (ref 15–41)
Albumin: 4.2 g/dL (ref 3.5–5.0)
Anion gap: 8 (ref 5–15)
BILIRUBIN TOTAL: 0.2 mg/dL — AB (ref 0.3–1.2)
BUN: 13 mg/dL (ref 6–20)
CALCIUM: 9.2 mg/dL (ref 8.9–10.3)
CO2: 27 mmol/L (ref 22–32)
CREATININE: 0.61 mg/dL (ref 0.44–1.00)
Chloride: 103 mmol/L (ref 101–111)
GFR calc Af Amer: >60 mL/min (ref >60)
Glucose, Bld: 111 mg/dL — ABNORMAL HIGH (ref 65–99)
POTASSIUM: 3.6 mmol/L (ref 3.5–5.1)
Sodium: 138 mmol/L (ref 135–145)
TOTAL PROTEIN: 7.4 g/dL (ref 6.5–8.1)

## 2015-12-16 MED ORDER — FLUTICASONE PROPIONATE 50 MCG/ACT NA SUSP: 2.0000 | Freq: Every day | NASAL | Status: DC

## 2015-12-16 NOTE — Progress Notes (Signed)
Patient ID: [name], female   DOB: November 12, 1955, 60 y.o.   MRN: [id number]        Patient: [name] Female    DOB: 05/21/1956   60 y.o.   MRN: [id number] Visit Date: 12/16/2015  Today's Provider: Margarita Rana, MD   Chief Complaint  Patient presents with  . Cough    X's two weeks   Subjective:    Cough This is a new problem. The current episode started 1 to 4 weeks ago. The problem has been unchanged. The cough is productive of sputum. Associated symptoms include myalgias (Pt reports having muscle cramps.). Pertinent negatives include no ear pain, eye redness, fever, headaches, heartburn, nasal congestion, postnasal drip, rash, rhinorrhea, sore throat, shortness of breath, sweats, weight loss or wheezing. Nothing aggravates the symptoms. She has tried nothing for the symptoms. Her past medical history is significant for environmental allergies (Pt says she has trouble during the summer. ).    Also taking her BP medication without any problems. BP stable today. Also started on Meloxicam and needs labs rechecked to check on kidney function.        Allergies  Allergen Reactions  . Aspirin Nausea Only  . Hydrochlorothiazide     Rash  . Lisinopril     Other reaction(s): Unknown  . Sulfa Antibiotics Hives and Nausea Only   Previous Medications   AMLODIPINE (NORVASC) 10 MG TABLET    Take 1 tablet (10 mg total) by mouth daily.   CHOLECALCIFEROL (VITAMIN D) 2000 UNITS TABLET    Take 2,000 Units by mouth daily.   DEXLANSOPRAZOLE (DEXILANT) 60 MG CAPSULE    Take 1 capsule (60 mg total) by mouth daily.   FERROUS SULFATE 325 (65 FE) MG TABLET    Take 325 mg by mouth daily with breakfast.   MELOXICAM (MOBIC) 7.5 MG TABLET    Take 7.5 mg by mouth daily.    OMEGA-3 FATTY ACIDS (FISH OIL) 1200 MG CAPS    Take 1 capsule by mouth daily.   SIMVASTATIN (ZOCOR) 20 MG TABLET    TAKE ONE TABLET BY MOUTH IN THE EVENING   SUCRALFATE (CARAFATE) 1 G TABLET    Take 1 tablet (1 g total) by  mouth 4 (four) times daily -  with meals and at bedtime. For 1 week, then BID for another week    Review of Systems  Constitutional: Negative.  Negative for fever and weight loss.  HENT: Positive for voice change. Negative for congestion, ear discharge, ear pain, mouth sores, nosebleeds, postnasal drip, rhinorrhea, sinus pressure, sneezing, sore throat and trouble swallowing.   Eyes: Negative for photophobia, pain, discharge, redness, itching and visual disturbance.  Respiratory: Positive for cough and chest tightness. Negative for apnea, choking, shortness of breath, wheezing and stridor.   Cardiovascular: Negative.   Gastrointestinal: Positive for nausea (This morning; but she feels better now. ). Negative for heartburn, vomiting, abdominal pain, diarrhea, constipation, blood in stool, abdominal distention, anal bleeding and rectal pain.  Musculoskeletal: Positive for myalgias (Pt reports having muscle cramps.). Negative for back pain, joint swelling, arthralgias, gait problem, neck pain and neck stiffness.  Skin: Negative for rash.  Allergic/Immunologic: Positive for environmental allergies (Pt says she has trouble during the summer. ).  Neurological: Negative for dizziness, light-headedness, numbness and headaches.    Social History  Substance Use Topics  . Smoking status: Never Smoker   . Smokeless tobacco: Never Used  . Alcohol Use: No   Objective:   BP  118/68 mmHg  Pulse 72  Temp(Src) 98.5 F (36.9 C) (Oral)  Resp 16  Wt 171 lb (77.565 kg)  Physical Exam  Constitutional: She is oriented to person, place, and time. She appears well-developed and well-nourished.  HENT:  Head: Normocephalic and atraumatic.  Right Ear: A middle ear effusion is present.  Left Ear: Tympanic membrane, external ear and ear canal normal.  Nose: Mucosal edema present.  Mouth/Throat: Uvula is midline, oropharynx is clear and moist and mucous membranes are normal.  Cardiovascular: Normal rate and  regular rhythm.   Pulmonary/Chest: Effort normal and breath sounds normal.  Neurological: She is alert and oriented to person, place, and time.  Skin: Skin is warm and dry.  Psychiatric: She has a normal mood and affect. Her behavior is normal. Judgment and thought content normal.      Assessment & Plan:     1. Cough As below.  Patient instructed to call back if condition worsens or does not improve and may need further work up.    2. Need for hepatitis C screening test Lab sheet provided.  - Hepatitis C Antibody  3. Allergic rhinitis, unspecified allergic rhinitis type Suspect cause for cough;  Will treat as below. Advised pt to call if not improved or worsening.  - fluticasone (FLONASE) 50 MCG/ACT nasal spray; Place 2 sprays into both nostrils daily.  Dispense: 16 g; Refill: 5 - CBC with Differential/Platelet - Comprehensive metabolic panel  4. Essential (primary) hypertension Stable; Continue current medications.  - CBC with Differential/Platelet - Comprehensive metabolic panel  Patient was seen and examined by Jerrell Belfast, MD, and note scribed by Ashley Royalty, CMA.  I have reviewed the document for accuracy and completeness and I agree with above. - Jerrell Belfast, MD    Margarita Rana, MD  [facility]

## 2015-12-17 ENCOUNTER — Telehealth: Payer: Self-pay

## 2015-12-17 LAB — HEPATITIS C ANTIBODY

## 2015-12-17 NOTE — Telephone Encounter (Signed)
Left message to call back  

## 2015-12-17 NOTE — Telephone Encounter (Signed)
Advised pt of lab results. Pt verbally acknowledges understanding. [name], CMA   

## 2015-12-17 NOTE — Telephone Encounter (Signed)
-----   Message from Margarita Rana, MD sent at 12/17/2015 11:42 AM EDT ----- Hep C negative.  Thanks.

## 2015-12-29 ENCOUNTER — Other Ambulatory Visit: Payer: Self-pay | Admitting: Family Medicine

## 2015-12-29 DIAGNOSIS — E7800 Pure hypercholesterolemia, unspecified: Secondary | ICD-10-CM

## 2015-12-29 NOTE — Telephone Encounter (Signed)
Last lipids checked on 09/17/2015 and were stable. Renaldo Fiddler, CMA

## 2016-01-05 ENCOUNTER — Other Ambulatory Visit: Payer: Self-pay | Admitting: Physician Assistant

## 2016-01-05 ENCOUNTER — Other Ambulatory Visit: Payer: Self-pay | Admitting: Family Medicine

## 2016-01-05 DIAGNOSIS — I10 Essential (primary) hypertension: Secondary | ICD-10-CM

## 2016-01-05 MED ORDER — AMLODIPINE BESYLATE 10 MG PO TABS: 10.0000 mg | ORAL_TABLET | Freq: Every day | ORAL | Status: DC

## 2016-01-05 NOTE — Telephone Encounter (Signed)
Was last seen 12/16/2015. Renaldo Fiddler, CMA

## 2016-01-05 NOTE — Telephone Encounter (Signed)
Pt is needing refill of amLODipine (NORVASC) 10 MG tablet  Sent into [facility], she has been out for 2 days

## 2016-01-06 ENCOUNTER — Other Ambulatory Visit: Payer: Self-pay

## 2016-01-06 NOTE — Telephone Encounter (Signed)
Prescription already sent to [facility] yesterday 01/05/16.  Thanks,  -Stasha Naraine

## 2016-02-29 NOTE — Telephone Encounter (Signed)
error 

## 2016-03-22 ENCOUNTER — Encounter: Payer: Self-pay | Admitting: Family Medicine

## 2016-03-22 ENCOUNTER — Telehealth: Payer: Self-pay

## 2016-03-22 ENCOUNTER — Other Ambulatory Visit
Admission: RE | Admit: 2016-03-22 | Discharge: 2016-03-22 | Disposition: A | Discharge status: Home / Self Care | Payer: Commercial Managed Care - HMO | Source: Ambulatory Visit | Attending: Family Medicine | Admitting: Family Medicine

## 2016-03-22 ENCOUNTER — Ambulatory Visit (INDEPENDENT_AMBULATORY_CARE_PROVIDER_SITE_OTHER): Payer: Commercial Managed Care - HMO | Admitting: Family Medicine

## 2016-03-22 VITALS — BP 124/62 | HR 56 | Temp 98.0°F | Resp 16 | Wt 173.0 lb

## 2016-03-22 DIAGNOSIS — R1013 Epigastric pain: Secondary | ICD-10-CM | POA: Insufficient documentation

## 2016-03-22 LAB — COMPREHENSIVE METABOLIC PANEL
ALBUMIN: 3.7 g/dL (ref 3.5–5.0)
ALK PHOS: 87 U/L (ref 38–126)
ALT: 8 U/L — ABNORMAL LOW (ref 14–54)
ANION GAP: 8 (ref 5–15)
AST: 20 U/L (ref 15–41)
BILIRUBIN TOTAL: 0.4 mg/dL (ref 0.3–1.2)
BUN: 10 mg/dL (ref 6–20)
CHLORIDE: 103 mmol/L (ref 101–111)
CO2: 29 mmol/L (ref 22–32)
Calcium: 8.8 mg/dL — ABNORMAL LOW (ref 8.9–10.3)
Creatinine, Ser: 0.68 mg/dL (ref 0.44–1.00)
GFR calc Af Amer: >60 mL/min (ref >60)
Glucose, Bld: 91 mg/dL (ref 65–99)
POTASSIUM: 3.3 mmol/L — AB (ref 3.5–5.1)
Sodium: 140 mmol/L (ref 135–145)
Total Protein: 6.6 g/dL (ref 6.5–8.1)

## 2016-03-22 LAB — CBC WITH DIFFERENTIAL/PLATELET
Basophils Absolute: 0 10*3/uL (ref 0–0.1)
Basophils Relative: 1 %
Eosinophils Absolute: 0 10*3/uL (ref 0–0.7)
Eosinophils Relative: 0 %
HEMATOCRIT: 43.9 % (ref 35.0–47.0)
HEMOGLOBIN: 15.1 g/dL (ref 12.0–16.0)
LYMPHS ABS: 2.4 10*3/uL (ref 1.0–3.6)
LYMPHS PCT: 37 %
MCH: 28.2 pg (ref 26.0–34.0)
MCHC: 34.5 g/dL (ref 32.0–36.0)
MCV: 81.8 fL (ref 80.0–100.0)
MONO ABS: 0.4 10*3/uL (ref 0.2–0.9)
MONOS PCT: 6 %
NEUTROS ABS: 3.6 10*3/uL (ref 1.4–6.5)
NEUTROS PCT: 56 %
Platelets: 230 10*3/uL (ref 150–440)
RBC: 5.36 MIL/uL — ABNORMAL HIGH (ref 3.80–5.20)
RDW: 13.3 % (ref 11.5–14.5)
WBC: 6.4 10*3/uL (ref 3.6–11.0)

## 2016-03-22 LAB — LIPASE, BLOOD: LIPASE: 24 U/L (ref 11–51)

## 2016-03-22 MED ORDER — SUCRALFATE 1 G PO TABS: 1.0000 g | ORAL_TABLET | Freq: Three times a day (TID) | ORAL | 0 refills | Status: DC

## 2016-03-22 NOTE — Telephone Encounter (Signed)
Left

## 2016-03-22 NOTE — Progress Notes (Signed)
Subjective:     Patient ID: [name], female   DOB: [birth year], 60 y.o.   MRN: [id number]  HPI  Chief Complaint  Patient presents with  . Abdominal Pain    Patient comes in office today with concerns of upper abdominal pain that has been intermittent for the past 3 weeks. Patient reports that pain usually occurs after eating a meal, patient reports feeting bloated. Patient states that he has been taking acid reducer to help with symptoms  States she had extensive G.I. Workup last year for similar pain and was felt to have nsaid related inflammation per her report. She remains on daily PPI and meloxicam for cervicogenic headaches. States she is moving her bowels well.   Review of Systems     Objective:   Physical Exam  Constitutional: She appears well-developed and well-nourished. No distress.  Pulmonary/Chest: Breath sounds normal.  Abdominal: Bowel sounds are normal. There is tenderness (moderate in epigastric area). There is no guarding.       Assessment:    1. Epigastric pain - CBC with Differential/Platelet - Comprehensive metabolic panel - Lipase - HELICOBACTER PYLORI  ANTIBODY, IGM - sucralfate (CARAFATE) 1 g tablet; Take 1 tablet (1 g total) by mouth 4 (four) times daily -  with meals and at bedtime.  Dispense: 28 tablet; Refill: 0    Plan:    Hold meloxicam pending lab work. Consider referral back to G.I.

## 2016-03-22 NOTE — Patient Instructions (Signed)
Hold meloxicam. Use Tylenol extra strength two pills 3 x day as needed for pain

## 2016-03-22 NOTE — Telephone Encounter (Signed)
-----   Message from Carmon Ginsberg, [place] sent at 03/22/2016 12:42 PM EDT ----- Labs ok so far except a mildly decreased potassium. May wish to add a supplement for a week or so over the counter. We will get the Helicobactor antibody results in a few days.

## 2016-03-23 ENCOUNTER — Telehealth: Payer: Self-pay

## 2016-03-23 LAB — HELICOBACTER PYLORI  ANTIBODY, IGM

## 2016-03-23 NOTE — Telephone Encounter (Signed)
Unable to reach patient at this time, automated answering machine stated that they were not accepting calls at this time. Will try and reach out to patient again this afternoon. KW

## 2016-03-23 NOTE — Telephone Encounter (Signed)
-----   Message from Carmon Ginsberg, [place] sent at 03/22/2016 12:42 PM EDT ----- Labs ok so far except a mildly decreased potassium. May wish to add a supplement for a week or so over the counter. We will get the Helicobactor antibody results in a few days.

## 2016-03-24 NOTE — Telephone Encounter (Signed)
Returning note to you

## 2016-03-24 NOTE — Telephone Encounter (Signed)
Unable to reach patient will try again later and contact emergency contact.KW

## 2016-03-24 NOTE — Telephone Encounter (Signed)
-----   Message from Carmon Ginsberg, [place] sent at 03/24/2016  0000000 AM EDT ----- Helicobactor antibody test ok. Are you doing better off meloxicam on on Carafate?

## 2016-03-25 NOTE — Telephone Encounter (Signed)
Attempted to contact patient phone no longer in service, contacted emergency contact who is not accepting calls at this time, will try again this evening to contact emergency contact. If unable to reach patient or emergency contact will mail letter home. KW

## 2016-03-29 ENCOUNTER — Other Ambulatory Visit: Payer: Self-pay | Admitting: Family Medicine

## 2016-03-29 DIAGNOSIS — R1013 Epigastric pain: Secondary | ICD-10-CM

## 2016-03-29 NOTE — Telephone Encounter (Signed)
Still unable to reach patient, will mail letter home. KW

## 2016-03-29 NOTE — Telephone Encounter (Signed)
Please proceed with referral. [name]

## 2016-03-29 NOTE — Telephone Encounter (Signed)
Referral in progress. 

## 2016-03-29 NOTE — Telephone Encounter (Signed)
Spoke with patient on the phone she states that her symptoms have not improved and she is still bloated. She states meloxicam and carafate have not helped. Patient would like to know if she should return back to Dr. Geryl Rankins? Or if a referral is needed? Patient contact # [phone]

## 2016-03-29 NOTE — Telephone Encounter (Signed)
I told her to come off meloxicam as I felt that was causing her gastritis. If she is not taking this or any other nsaid and still not feeling improved would refer her back to [facility] Dr.Wohl.

## 2016-03-31 ENCOUNTER — Encounter: Payer: Self-pay | Admitting: Gastroenterology

## 2016-05-13 ENCOUNTER — Other Ambulatory Visit: Payer: Self-pay | Admitting: Family Medicine

## 2016-05-13 DIAGNOSIS — K219 Gastro-esophageal reflux disease without esophagitis: Secondary | ICD-10-CM

## 2016-05-31 ENCOUNTER — Ambulatory Visit: Payer: Commercial Managed Care - HMO | Admitting: Gastroenterology

## 2016-06-13 ENCOUNTER — Ambulatory Visit: Payer: Commercial Managed Care - HMO | Admitting: Gastroenterology

## 2016-07-24 ENCOUNTER — Emergency Department
Admission: EM | Admit: 2016-07-24 | Discharge: 2016-07-24 | Disposition: A | Discharge status: Home / Self Care | Payer: Commercial Managed Care - HMO | Attending: Emergency Medicine | Admitting: Emergency Medicine

## 2016-07-24 ENCOUNTER — Emergency Department: Payer: Commercial Managed Care - HMO

## 2016-07-24 DIAGNOSIS — R509 Fever, unspecified: Secondary | ICD-10-CM

## 2016-07-24 DIAGNOSIS — Z79899 Other long term (current) drug therapy: Secondary | ICD-10-CM | POA: Diagnosis not present

## 2016-07-24 DIAGNOSIS — N3001 Acute cystitis with hematuria: Secondary | ICD-10-CM | POA: Diagnosis not present

## 2016-07-24 DIAGNOSIS — R52 Pain, unspecified: Secondary | ICD-10-CM | POA: Diagnosis not present

## 2016-07-24 DIAGNOSIS — J9811 Atelectasis: Secondary | ICD-10-CM | POA: Diagnosis not present

## 2016-07-24 DIAGNOSIS — R6889 Other general symptoms and signs: Secondary | ICD-10-CM | POA: Diagnosis not present

## 2016-07-24 LAB — URINALYSIS, COMPLETE (UACMP) WITH MICROSCOPIC
BILIRUBIN URINE: NEGATIVE
GLUCOSE, UA: NEGATIVE mg/dL
Ketones, ur: NEGATIVE mg/dL
Leukocytes, UA: NEGATIVE
NITRITE: NEGATIVE
PH: 6 (ref 5.0–8.0)
Protein, ur: NEGATIVE mg/dL
SPECIFIC GRAVITY, URINE: 1.011 (ref 1.005–1.030)

## 2016-07-24 LAB — INFLUENZA PANEL BY PCR (TYPE A & B)
INFLAPCR: NEGATIVE
INFLBPCR: NEGATIVE

## 2016-07-24 MED ORDER — LEVOFLOXACIN 750 MG PO TABS: 750.0000 mg | ORAL_TABLET | Freq: Every day | ORAL | 0 refills | Status: AC

## 2016-07-24 MED ORDER — ACETAMINOPHEN 325 MG PO TABS
650.0000 mg | ORAL_TABLET | Freq: Once | ORAL | Status: AC
  Administered 2016-07-24: 650 mg via ORAL

## 2016-07-24 MED ORDER — LEVOFLOXACIN 500 MG PO TABS
750.0000 mg | ORAL_TABLET | Freq: Once | ORAL | Status: AC
  Administered 2016-07-24: 750 mg via ORAL
  Filled 2016-07-24: qty 1

## 2016-07-24 MED ORDER — ACETAMINOPHEN 325 MG PO TABS
ORAL_TABLET | ORAL | Status: AC
  Filled 2016-07-24: qty 2

## 2016-07-24 NOTE — ED Notes (Signed)
Pt states has had cough, nasal congestion, body aches and fever for last  4 hours.

## 2016-07-24 NOTE — ED Provider Notes (Signed)
[facility] Emergency Department Provider Note  ____________________________________________  Time seen: Approximately 8:16 PM  I have reviewed the triage vital signs and the nursing notes.   HISTORY  Chief Complaint Influenza   HPI [name] is a 60 y.o. female who presents to the emergency department for evaluation of sudden onset of fever and body aches since about 3:00 this afternoon. She did not take any medication prior to arrival. She states that she took a nap and when she awakened "a few minutes ago" she realized that she had a fever an needed to come to the hospital. She called EMS because she "felt too bad to wait in the waiting room." She denies nausea, vomiting, diarrhea, earache, or sore throat. She states that she has started coughing and has a headache since fever started this afternoon.   Past Medical History:  Diagnosis Date  . Allergy   . Anemia    takes Ferrous Sulfate daily  . Arthritis   . GERD (gastroesophageal reflux disease)    takes OTC med  . Hyperlipidemia   . Hypertension    takes Amlodipine daily  . Low back pain    occasionally    Patient Active Problem List   Diagnosis Date Noted  . Need for hepatitis C screening test 12/16/2015  . Gastritis   . Epigastric pain 03/24/2015  . BPPV (benign paroxysmal positional vertigo) 03/24/2015  . Right hip pain 01/07/2015  . Absolute anemia 01/06/2015  . Body mass index (BMI) of 28.0-28.9 in adult 01/06/2015  . Cervical disc disease 01/06/2015  . Atypical chest pain 01/06/2015  . Generalized pruritus 01/06/2015  . Acid reflux 01/06/2015  . Cervicogenic headache 01/06/2015  . Hypercholesteremia 01/06/2015  . High potassium 01/06/2015  . LBP (low back pain) 01/06/2015  . Pseudoarthrosis of cervical spine ([place]) 08/26/2013  . Accumulation of fluid in tissues 12/24/2009  . Avitaminosis D 10/15/2009  . Cardiac conduction disorder 09/30/2008  . Essential (primary)  hypertension 09/30/2008  . Blood in the urine 09/30/2008    Past Surgical History:  Procedure Laterality Date  . ANTERIOR CERVICAL DECOMP/DISCECTOMY FUSION  10/19/2011   Procedure: ANTERIOR CERVICAL DECOMPRESSION/DISCECTOMY FUSION 1 LEVEL/HARDWARE REMOVAL;  Surgeon: Elaina Hoops, MD;  Location: [place] [facility];  Service: Neurosurgery;  Laterality: N/A;  Cervical four - five  Anterior cervical decompression fusion with redo at six - seven  Rm 33  . COLONOSCOPY  08/2014   polyps  . ESOPHAGOGASTRODUODENOSCOPY (EGD) WITH PROPOFOL N/A 06/23/2015   Procedure: ESOPHAGOGASTRODUODENOSCOPY (EGD) WITH PROPOFOL;  Surgeon: [name], MD;  Location: [facility] ENDOSCOPY;  Service: Endoscopy;  Laterality: N/A;  . NECK SURGERY    . POSTERIOR CERVICAL FUSION/FORAMINOTOMY N/A 08/26/2013   Procedure: C/4-5,C/5-6,C/6-7 Posterior Cervical Fusion w/lateral mass fixation;  Surgeon: Elaina Hoops, MD;  Location: [place] [facility];  Service: Neurosurgery;  Laterality: N/A;  . TUBAL LIGATION      Prior to Admission medications   Medication Sig Start Date End Date Taking? Authorizing Provider  amLODipine (NORVASC) 10 MG tablet Take 1 tablet (10 mg total) by mouth daily. 01/05/16   Margarita Rana, MD  Cholecalciferol (VITAMIN D) 2000 UNITS tablet Take 2,000 Units by mouth daily.    Historical Provider, MD  DEXILANT 60 MG capsule TAKE ONE CAPSULE BY MOUTH ONCE DAILY 05/13/16   Mar Daring, PA-C  ferrous sulfate 325 (65 FE) MG tablet Take 325 mg by mouth daily with breakfast.    Historical Provider, MD  fluticasone (FLONASE) 50 MCG/ACT  nasal spray Place 2 sprays into both nostrils daily. 12/16/15   Margarita Rana, MD  levofloxacin (LEVAQUIN) 750 MG tablet Take 1 tablet (750 mg total) by mouth daily. 07/24/16 07/31/16  [name], FNP  meloxicam (MOBIC) 7.5 MG tablet Take 7.5 mg by mouth daily.     Historical Provider, MD  Omega-3 Fatty Acids (FISH OIL) 1200 MG CAPS Take 1 capsule by mouth daily.    Historical Provider, MD   simvastatin (ZOCOR) 20 MG tablet TAKE ONE TABLET BY MOUTH IN THE EVENING 12/29/15   Margarita Rana, MD  sucralfate (CARAFATE) 1 g tablet Take 1 tablet (1 g total) by mouth 4 (four) times daily -  with meals and at bedtime. 03/22/16   Carmon Ginsberg, PA    Allergies Aspirin; Hydrochlorothiazide; Lisinopril; and Sulfa antibiotics  Family History  Problem Relation Age of Onset  . Hypertension Mother   . Breast cancer Neg Hx     Social History Social History  Substance Use Topics  . Smoking status: Never Smoker  . Smokeless tobacco: Never Used  . Alcohol use No    Review of Systems Constitutional: Positive for  fever/chills ENT: Negative sore throat. Cardiovascular: Denies chest pain. Respiratory: Negative for shortness of breath. Positive for cough. Gastrointestinal: Negative for nausea,  no vomiting.  Negative for diarrhea.  Musculoskeletal: Positive for body aches Skin: Negative for rash. Neurological: Positive for headaches ____________________________________________   PHYSICAL EXAM:  VITAL SIGNS: ED Triage Vitals [07/24/16 1954]  Enc Vitals Group     BP 124/84     Pulse Rate (!) 106     Resp 18     Temp (!) 100.5 F (38.1 C)     Temp Source Oral     SpO2 100 %     Weight 173 lb (78.5 kg)     Height 5\' 5"  (1.651 m)     Head Circumference      Peak Flow      Pain Score 10     Pain Loc      Pain Edu?      Excl. in [place]?     Constitutional: Alert and oriented. Acutely ill appearing and in no acute distress. Eyes: Conjunctivae are normal. EOMI. Ears: Bilateral TM erythematous without loss of light reflex. Nose: Maxillary sinus congestion; no rhinnorhea. Mouth/Throat: Mucous membranes are moist.  Oropharynx mildly erythematous. Tonsils appear normal. Neck: No stridor.  Lymphatic: No cervical lymphadenopathy. Cardiovascular: Normal rate, regular rhythm. Grossly normal heart sounds.  Good peripheral circulation. Respiratory: Normal respiratory effort.  No  retractions. Clear to auscultation. Gastrointestinal: Soft and nontender. No CVA tenderness. Musculoskeletal: FROM x 4 extremities.  Neurologic:  Normal speech and language.  Skin:  Skin is warm, dry and intact. No rash noted. Psychiatric: Mood and affect are normal. Speech and behavior are normal.  ____________________________________________   LABS (all labs ordered are listed, but only abnormal results are displayed)  Labs Reviewed  URINALYSIS, COMPLETE (UACMP) WITH MICROSCOPIC - Abnormal; Notable for the following:       Result Value   Color, Urine YELLOW (*)    APPearance CLEAR (*)    Hgb urine dipstick MODERATE (*)    Bacteria, UA RARE (*)    Squamous Epithelial / LPF 0-5 (*)    All other components within normal limits  INFLUENZA PANEL BY PCR (TYPE A & B, H1N1)   ____________________________________________  EKG  Not indicated ____________________________________________  RADIOLOGY  Chest x-ray negative for acute cardiopulmonary abnormality per radiology. ____________________________________________  PROCEDURES  Procedure(s) performed: None  Critical Care performed: No  ____________________________________________   INITIAL IMPRESSION / ASSESSMENT AND PLAN / ED COURSE  Clinical Course     Pertinent labs & imaging results that were available during my care of the patient were reviewed by me and considered in my medical decision making (see chart for details).   60 year old female presenting to the emergency department for very vague complaint of chills and body aches that started this evening. Influenza test is negative, chest x-ray is negative by urinalysis indicates urinary tract infection and she will be treated with Levaquin due to the fever of 100.5 upon arrival. She was given follow-up instructions to see her primary care provider for symptoms that are not improving over the next few days. She was instructed to return to the emergency department for  symptoms change or worsen if unable schedule appointment. ____________________________________________   FINAL CLINICAL IMPRESSION(S) / ED DIAGNOSES  Final diagnoses:  Acute cystitis with hematuria  Fever, unspecified fever cause    Note:  This document was prepared using Dragon voice recognition software and may include unintentional dictation errors.     [name], FNP 07/24/16 [id number]    Delman Kitten, MD 07/25/16 [phone]

## 2016-07-24 NOTE — ED Notes (Signed)
Pt arrived via EMS and brought to stat registration via wheelchair; pt awake and alert; c/o flu like symptoms since this afternoon; chills, cough, body aches, fever, BP 145/81, HR100, BS 135

## 2016-07-24 NOTE — ED Triage Notes (Signed)
Pt states that she felt great this am, states this afternoon started feeling chilled and took a nap, woke up with a headache and fever, pt states that she called ems to bring her in, flu swab performed in triage

## 2016-09-16 ENCOUNTER — Ambulatory Visit (INDEPENDENT_AMBULATORY_CARE_PROVIDER_SITE_OTHER): Payer: Medicare HMO | Admitting: Family Medicine

## 2016-09-16 ENCOUNTER — Encounter: Payer: Self-pay | Admitting: Family Medicine

## 2016-09-16 VITALS — BP 100/52 | HR 61 | Temp 98.1°F | Resp 16 | Wt 176.4 lb

## 2016-09-16 DIAGNOSIS — J40 Bronchitis, not specified as acute or chronic: Secondary | ICD-10-CM | POA: Diagnosis not present

## 2016-09-16 MED ORDER — CEFDINIR 300 MG PO CAPS: 300.0000 mg | ORAL_CAPSULE | Freq: Two times a day (BID) | ORAL | 0 refills | Status: DC

## 2016-09-16 NOTE — Progress Notes (Signed)
Subjective:     Patient ID: [name], female   DOB: [birth year], 61 y.o.   MRN: [id number]  HPI  Chief Complaint  Patient presents with  . Cough    Patient comes in office today with complaints of productive cough for the past two months. Patient reports that cough is productive of yellow/bloody sputum. Associated with cough patient complains of chest congestion, shortness of breath on exertion, sinus pressure below eyes and headache. Patient states that she hs been taking Mucinex Sinus.   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   Review of Systems     Objective:   Physical Exam  Constitutional: She appears well-developed and well-nourished. No distress.  Ears: T.M's intact without inflammation Throat: no tonsillar enlargement or exudate Neck: no cervical adenopathy Lungs: clear     Assessment:    1. Bronchitis - cefdinir (OMNICEF) 300 MG capsule; Take 1 capsule (300 mg total) by mouth 2 (two) times daily.  Dispense: 14 capsule; Refill: 0    Plan:   Discussed use of Mucinex and Delsym.

## 2016-09-16 NOTE — Patient Instructions (Signed)
Add Mucinex for an expectorant and Delsym for cough. If not better after completing antibiotic call me to get a chest x-ray ordered.

## 2016-09-19 ENCOUNTER — Other Ambulatory Visit: Payer: Self-pay

## 2016-09-19 DIAGNOSIS — E7800 Pure hypercholesterolemia, unspecified: Secondary | ICD-10-CM

## 2016-09-19 MED ORDER — SIMVASTATIN 20 MG PO TABS: 20.0000 mg | ORAL_TABLET | Freq: Every evening | ORAL | 5 refills | Status: DC

## 2016-09-21 ENCOUNTER — Other Ambulatory Visit: Payer: Self-pay | Admitting: Family Medicine

## 2016-09-21 ENCOUNTER — Ambulatory Visit
Admission: RE | Admit: 2016-09-21 | Discharge: 2016-09-21 | Disposition: A | Discharge status: Home / Self Care | Payer: Medicare HMO | Source: Ambulatory Visit | Attending: Family Medicine | Admitting: Family Medicine

## 2016-09-21 ENCOUNTER — Encounter: Payer: Self-pay | Admitting: Family Medicine

## 2016-09-21 ENCOUNTER — Ambulatory Visit (INDEPENDENT_AMBULATORY_CARE_PROVIDER_SITE_OTHER): Payer: Medicare HMO | Admitting: Family Medicine

## 2016-09-21 VITALS — BP 124/58 | HR 70 | Temp 98.5°F | Resp 17 | Wt 178.2 lb

## 2016-09-21 DIAGNOSIS — R05 Cough: Secondary | ICD-10-CM | POA: Diagnosis not present

## 2016-09-21 DIAGNOSIS — N760 Acute vaginitis: Secondary | ICD-10-CM | POA: Diagnosis not present

## 2016-09-21 DIAGNOSIS — J40 Bronchitis, not specified as acute or chronic: Secondary | ICD-10-CM

## 2016-09-21 DIAGNOSIS — I700 Atherosclerosis of aorta: Secondary | ICD-10-CM | POA: Diagnosis not present

## 2016-09-21 MED ORDER — FLUCONAZOLE 150 MG PO TABS: 150.0000 mg | ORAL_TABLET | Freq: Once | ORAL | 0 refills | Status: AC

## 2016-09-21 MED ORDER — HYDROCODONE-HOMATROPINE 5-1.5 MG/5ML PO SYRP: ORAL_SOLUTION | ORAL | 0 refills | Status: DC

## 2016-09-21 NOTE — Patient Instructions (Signed)
We will call you with the x-ray report 

## 2016-09-21 NOTE — Progress Notes (Signed)
Subjective:     Patient ID: [name], female   DOB: 1956-07-08, 61 y.o.   MRN: [id number]  HPI  Chief Complaint  Patient presents with  . Bronchitis    Patient comes in office today for follow up, last office visit ws 09/16/16 patient was started on Omnicef 300mg  and advised to take otc Delsym and Mucinex. Patient reports that cough is improving on otc medicatiobn but she has pain on the right side of her ear radiating down to her neck, patient states that throat is only sore on right side. Patient reports chest tightness and shortness of breath.  . Vaginitis    Patient reports since starting anitbiotic on 09/16/16, she has developed vaginal irritation and itching.   Has been taking Delsym for cough. States sputum is no longer colored.   Review of Systems     Objective:   Physical Exam  Constitutional: She appears well-developed and well-nourished. No distress.  Ears: T.M's intact without inflammation Throat: no tonsillar enlargement or exudate Neck: no cervical adenopathy Lungs: clear     Assessment:    1. Bronchitis: r/o pneumonia - HYDROcodone-homatropine (HYCODAN) 5-1.5 MG/5ML syrup; 5 ml 4-6 hours as needed for cough  Dispense: 240 mL; Refill: 0 - DG Chest 2 View; Future  2. Acute vaginitis: secondary to abx - fluconazole (DIFLUCAN) 150 MG tablet; Take 1 tablet (150 mg total) by mouth once.  Dispense: 1 tablet; Refill: 0    Plan:    Further f/u pending x-ray report.

## 2016-10-20 ENCOUNTER — Ambulatory Visit (INDEPENDENT_AMBULATORY_CARE_PROVIDER_SITE_OTHER): Payer: Medicare HMO | Admitting: Family Medicine

## 2016-10-20 ENCOUNTER — Encounter: Payer: Self-pay | Admitting: Family Medicine

## 2016-10-20 VITALS — BP 112/70 | HR 75 | Temp 98.1°F | Resp 16 | Wt 179.4 lb

## 2016-10-20 DIAGNOSIS — R05 Cough: Secondary | ICD-10-CM

## 2016-10-20 DIAGNOSIS — S39012A Strain of muscle, fascia and tendon of lower back, initial encounter: Secondary | ICD-10-CM

## 2016-10-20 DIAGNOSIS — S63601A Unspecified sprain of right thumb, initial encounter: Secondary | ICD-10-CM | POA: Diagnosis not present

## 2016-10-20 DIAGNOSIS — J301 Allergic rhinitis due to pollen: Secondary | ICD-10-CM | POA: Diagnosis not present

## 2016-10-20 DIAGNOSIS — R058 Other specified cough: Secondary | ICD-10-CM

## 2016-10-20 MED ORDER — PREDNISONE 20 MG PO TABS: ORAL_TABLET | ORAL | 0 refills | Status: DC

## 2016-10-20 MED ORDER — FLUTICASONE PROPIONATE 50 MCG/ACT NA SUSP: 2.0000 | Freq: Every day | NASAL | 5 refills | Status: DC

## 2016-10-20 NOTE — Progress Notes (Signed)
Subjective:     Patient ID: [name], female   DOB: 1955-12-12, 60 y.o.   MRN: [id number]  HPI  Chief Complaint  Patient presents with  . Bronchitis    Patient returns to office today for one week follow up, last office visit 09/21/16 patient was prescribed Hycodan and orderd chest x-ray. Chest x-ray showed no signs on pneumonia, patient states that he never picked up her prescription because insurance would not cover it. Patient reports productive cough with clear phlegm, shortness of breath and wheezing.   . Back Pain    Patient reports back pain in the middle of her back on the left side radiating down to lower back, paitent states that she believes she may have pulled a muscle.  . Finger Injury    Patient states that she believes she jammed her right thumb 2 days ago.   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 Reports seasonal allergies but not on medication at this time. States she struck her thumb against her grandchild as she was attempting to discipline him. States back hurts when she twists.   Review of Systems     Objective:   Physical Exam  Constitutional: She appears well-developed and well-nourished. No distress.  Pulmonary/Chest: Breath sounds normal. She has no wheezes.  Musculoskeletal:  Muscle strength in lower extremities 5/5. SLR's to 90 degrees without radiation of back pain. Mild tenderness to left paravertebral lumbar area. Right thumb is mildly swollen with tenderness about her DIP but no deformity. Increased pain with ligamental testing. Flexion 5/5.       Assessment:    1. Sprain of right thumb, unspecified site of finger, initial encounte  2. Strain of lumbar region, initial encounter  3. Cough productive of clear sputum - predniSONE (DELTASONE) 20 MG tablet; One pill twice daily for 5 days.  Dispense: 10 tablet; Refill: 0  4. Acute seasonal allergic rhinitis due to pollen - predniSONE (DELTASONE) 20 MG tablet; One pill twice daily for 5  days.  Dispense: 10 tablet; Refill: 0 - fluticasone (FLONASE) 50 MCG/ACT nasal spray; Place 2 sprays into both nostrils daily.  Dispense: 16 g; Refill: 5    Plan:    Discussed use of Tylenol and cold compresses to the thumb. Suggested continuing the steroid nasal spray throughout the Spring.

## 2016-10-20 NOTE — Patient Instructions (Signed)
Use Tylenol up to 3000 mg./day for pain. Ice your thumb several x day for the next 48 hours. Continue the steroid nasal spray for the Spring.

## 2016-10-25 ENCOUNTER — Other Ambulatory Visit: Payer: Self-pay | Admitting: Physician Assistant

## 2016-10-25 DIAGNOSIS — I10 Essential (primary) hypertension: Secondary | ICD-10-CM

## 2016-10-25 MED ORDER — AMLODIPINE BESYLATE 10 MG PO TABS: 10.0000 mg | ORAL_TABLET | Freq: Every day | ORAL | 6 refills | Status: DC

## 2016-10-25 NOTE — Telephone Encounter (Signed)
Patient was last seen by Mikki Santee on 10/20/2016 for an acute visit. Are you willing to refill this?

## 2016-10-25 NOTE — Telephone Encounter (Signed)
Pt contacted office for refill request on the following medications:  amLODipine (NORVASC) 10 MG tablet.  [name]  CB#[phone]/MW

## 2016-11-09 ENCOUNTER — Ambulatory Visit (INDEPENDENT_AMBULATORY_CARE_PROVIDER_SITE_OTHER): Payer: Medicare HMO | Admitting: Family Medicine

## 2016-11-09 ENCOUNTER — Encounter: Payer: Self-pay | Admitting: Family Medicine

## 2016-11-09 VITALS — BP 124/66 | HR 72 | Temp 98.6°F | Resp 16 | Wt 180.0 lb

## 2016-11-09 DIAGNOSIS — H1011 Acute atopic conjunctivitis, right eye: Secondary | ICD-10-CM

## 2016-11-09 NOTE — Patient Instructions (Signed)
Discussed use of Zaditor eye drops and frequent warm wet compresses. Call me tomorrow on how you are doing.

## 2016-11-09 NOTE — Progress Notes (Signed)
Subjective:     Patient ID: [name], female   DOB: March 30, 1956, 61 y.o.   MRN: [id number]  HPI  Chief Complaint  Patient presents with  . Eye Drainage    Patient comes in office today with concerns of drainage of the right eye for the past 24hrs. Patient states that she has had itching of the eye and crusting around corner of eye, patient reports that she has been taking otc allergy medicine.   Reports it feels similar to itchy eyes she has had from allergies in the past. Denies matting but reports clear watery drainage. No change in vision or contact use. States grandchildren do not have eye infections.   Review of Systems     Objective:   Physical Exam  Constitutional: She appears well-developed and well-nourished. No distress.  Eyes: Pupils are equal, round, and reactive to light.  V.A. Intact to # of fingers. Minimal scleral injection with no f.b.visualized.       Assessment:    1. Allergic conjunctivitis of right eye    Plan:    Discussed use of Zaditor and frequent warm compresses. Phone f/u in 24 hours.

## 2016-11-25 ENCOUNTER — Telehealth: Payer: Self-pay | Admitting: Physician Assistant

## 2016-11-25 NOTE — Telephone Encounter (Signed)
Called Pt to schedule AWV with [facility] - knb °

## 2016-12-07 ENCOUNTER — Ambulatory Visit (INDEPENDENT_AMBULATORY_CARE_PROVIDER_SITE_OTHER): Payer: Medicare HMO

## 2016-12-07 ENCOUNTER — Other Ambulatory Visit: Payer: Self-pay

## 2016-12-07 VITALS — BP 108/60 | HR 64 | Temp 98.9°F | Ht 65.0 in | Wt 178.0 lb

## 2016-12-07 DIAGNOSIS — R1013 Epigastric pain: Secondary | ICD-10-CM

## 2016-12-07 DIAGNOSIS — Z0000 Encounter for general adult medical examination without abnormal findings: Secondary | ICD-10-CM

## 2016-12-07 DIAGNOSIS — K219 Gastro-esophageal reflux disease without esophagitis: Secondary | ICD-10-CM

## 2016-12-07 MED ORDER — SUCRALFATE 1 G PO TABS: 1.0000 g | ORAL_TABLET | Freq: Three times a day (TID) | ORAL | 5 refills | Status: DC

## 2016-12-07 MED ORDER — DEXLANSOPRAZOLE 60 MG PO CPDR: 1.0000 | DELAYED_RELEASE_CAPSULE | Freq: Every day | ORAL | 1 refills | Status: DC

## 2016-12-07 NOTE — Telephone Encounter (Signed)
Pt was seen in office today for an AWV and is requesting a refill on Dexilant 60 mg (takes 1 capsule daily) and Sucralfate 1g (takes four times daily with meals). Pt has scheduled an appointment with you on 12/09/16. Please advise for refills, thanks! -MM

## 2016-12-07 NOTE — Progress Notes (Signed)
Subjective:   [name] is a 61 y.o. female who presents for Medicare Annual (Subsequent) preventive examination.  Review of Systems:  N/A  Cardiac Risk Factors include: advanced age (>83men, >35 women);dyslipidemia;hypertension     Objective:     Vitals: BP 108/60 (BP Location: Right Arm)   Pulse 64   Temp 98.9 F (37.2 C) (Oral)   Ht 5\' 5"  (1.651 m)   Wt 178 lb (80.7 kg)   BMI 29.62 kg/m   Body mass index is 29.62 kg/m.   Tobacco History  Smoking Status  . Never Smoker  Smokeless Tobacco  . Never Used     Counseling given: Not Answered   Past Medical History:  Diagnosis Date  . Allergy   . Anemia    takes Ferrous Sulfate daily  . Arthritis   . GERD (gastroesophageal reflux disease)    takes OTC med  . Hyperlipidemia   . Hypertension    takes Amlodipine daily  . Low back pain    occasionally   Past Surgical History:  Procedure Laterality Date  . ANTERIOR CERVICAL DECOMP/DISCECTOMY FUSION  10/19/2011   Procedure: ANTERIOR CERVICAL DECOMPRESSION/DISCECTOMY FUSION 1 LEVEL/HARDWARE REMOVAL;  Surgeon: Elaina Hoops, MD;  Location: [place] [facility];  Service: Neurosurgery;  Laterality: N/A;  Cervical four - five  Anterior cervical decompression fusion with redo at six - seven  Rm 33  . COLONOSCOPY  08/2014   polyps  . ESOPHAGOGASTRODUODENOSCOPY (EGD) WITH PROPOFOL N/A 06/23/2015   Procedure: ESOPHAGOGASTRODUODENOSCOPY (EGD) WITH PROPOFOL;  Surgeon: [name], MD;  Location: [facility] ENDOSCOPY;  Service: Endoscopy;  Laterality: N/A;  . NECK SURGERY    . POSTERIOR CERVICAL FUSION/FORAMINOTOMY N/A 08/26/2013   Procedure: C/4-5,C/5-6,C/6-7 Posterior Cervical Fusion w/lateral mass fixation;  Surgeon: Elaina Hoops, MD;  Location: [place] [facility];  Service: Neurosurgery;  Laterality: N/A;  . TUBAL LIGATION     Family History  Problem Relation Age of Onset  . Hypertension Mother   . Breast cancer Neg Hx    History  Sexual Activity  . Sexual activity: Not on file     Outpatient Encounter Prescriptions as of 12/07/2016  Medication Sig  . amLODipine (NORVASC) 10 MG tablet Take 1 tablet (10 mg total) by mouth daily.  . Cholecalciferol (VITAMIN D) 2000 UNITS tablet Take 2,000 Units by mouth daily.  Marland Kitchen DEXILANT 60 MG capsule TAKE ONE CAPSULE BY MOUTH ONCE DAILY  . ferrous sulfate 325 (65 FE) MG tablet Take 325 mg by mouth daily with breakfast.  . fluticasone (FLONASE) 50 MCG/ACT nasal spray Place 2 sprays into both nostrils daily.  . Omega-3 Fatty Acids (FISH OIL) 1200 MG CAPS Take 1 capsule by mouth daily.  . Potassium 75 MG TABS Take by mouth.  . simvastatin (ZOCOR) 20 MG tablet Take 1 tablet (20 mg total) by mouth every evening.  . sucralfate (CARAFATE) 1 g tablet Take 1 tablet (1 g total) by mouth 4 (four) times daily -  with meals and at bedtime.   No facility-administered encounter medications on file as of 12/07/2016.     Activities of Daily Living In your present state of health, do you have any difficulty performing the following activities: 12/07/2016  Hearing? N  Vision? Y  Difficulty concentrating or making decisions? N  Walking or climbing stairs? N  Dressing or bathing? N  Doing errands, shopping? N  Preparing Food and eating ? N  Using the Toilet? N  In the past six months, have you accidently  leaked urine? N  Do you have problems with loss of bowel control? N  Managing your Medications? N  Managing your Finances? N  Housekeeping or managing your Housekeeping? N  Some recent data might be hidden    Patient Care Team: Mar Daring, PA-C as PCP - General (Family Medicine) Carmon Ginsberg, [place] as Referring Physician (Family Medicine)    Assessment:     Exercise Activities and Dietary recommendations Current Exercise Habits: The patient does not participate in regular exercise at present (housework and yardwork), Exercise limited by: orthopedic condition(s)  Goals    . Increase water intake          Recommend  increasing water intake to 4 glasses a day.      Fall Risk Fall Risk  12/07/2016 09/08/2015 01/07/2015  Falls in the past year? No No No   Depression Screen PHQ 2/9 Scores 12/07/2016 12/07/2016 09/08/2015 01/07/2015  PHQ - 2 Score 0 0 1 1  PHQ- 9 Score 0 - - -     Cognitive Function     6CIT Screen 12/07/2016  What Year? 0 points  What month? 0 points  What time? 0 points  Count back from 20 0 points  Months in reverse 0 points  Repeat phrase 0 points  Total Score 0    Immunization History  Administered Date(s) Administered  . Tdap 09/30/2008   Screening Tests Health Maintenance  Topic Date Due  . PAP SMEAR  01/17/2016  . INFLUENZA VACCINE  02/22/2017  . MAMMOGRAM  10/15/2017  . TETANUS/TDAP  10/01/2018  . COLONOSCOPY  10/27/2024  . Hepatitis C Screening  Completed  . HIV Screening  Completed      Plan:  I have personally reviewed and addressed the Medicare Annual Wellness questionnaire and have noted the following in the patient's chart:  A. Medical and social history B. Use of alcohol, tobacco or illicit drugs  C. Current medications and supplements D. Functional ability and status E.  Nutritional status F.  Physical activity G. Advance directives H. List of other physicians I.  Hospitalizations, surgeries, and ER visits in previous 12 months J.  [place] such as hearing and vision if needed, cognitive and depression L. Referrals and appointments - none  In addition, I have reviewed and discussed with patient certain preventive protocols, quality metrics, and best practice recommendations. A written personalized care plan for preventive services as well as general preventive health recommendations were provided to patient.  See attached scanned questionnaire for additional information.   Signed,  Fabio Neighbors, LPN Nurse Health Advisor   MD Recommendations: Pt needs a pap smear at next visit with PCP. Pt states she will make an appointment  upon leaving today.  I have reviewed the documentation and information obtained by Fabio Neighbors, LPN in the above chart and agree as above. I was available for consultation if any questions or issues arose.  Fenton Malling, PA-C

## 2016-12-07 NOTE — Patient Instructions (Signed)
[name] , Thank you for taking time to come for your Medicare Wellness Visit. I appreciate your ongoing commitment to your health goals. Please review the following plan we discussed and let me know if I can assist you in the future.   Screening recommendations/referrals: Colonoscopy: completed 10/28/14, due 10/2024 Mammogram: completed 10/16/15, pt to set this up this year Bone Density: N/A Recommended yearly ophthalmology/optometry visit for glaucoma screening and checkup Recommended yearly dental visit for hygiene and checkup  Vaccinations: Influenza vaccine: due 03/2017 Pneumococcal vaccine: N/A, due at age 73 Tdap vaccine: completed 09/30/08, due 09/2018 Shingles vaccine: declined   Advanced directives: Advance directive discussed with you today. I have provided a copy for you to complete at home and have notarized. Once this is complete please bring a copy in to our office so we can scan it into your chart.  Conditions/risks identified: Recommend increasing water intake to 4 glasses a day.  Next appointment: None, need to schedule 1 year AWV and follow up with PCP.  Preventive Care 40-64 Years, Female Preventive care refers to lifestyle choices and visits with your health care provider that can promote health and wellness. What does preventive care include?  A yearly physical exam. This is also called an annual well check.  Dental exams once or twice a year.  Routine eye exams. Ask your health care provider how often you should have your eyes checked.  Personal lifestyle choices, including:  Daily care of your teeth and gums.  Regular physical activity.  Eating a healthy diet.  Avoiding tobacco and drug use.  Limiting alcohol use.  Practicing saf/e sex.  Taking low-dose aspirin daily starting at age 35.  Taking vitamin and mineral supplements as recommended by your health care provider. What happens during an annual well check? The services and screenings  done by your health care provider during your annual well check will depend on your age, overall health, lifestyle risk factors, and family history of disease. Counseling  Your health care provider may ask you questions about your:  Alcohol use.  Tobacco use.  Drug use.  Emotional well-being.  Home and relationship well-being.  Sexual activity.  Eating habits.  Work and work Statistician.  Method of birth control.  Menstrual cycle.  Pregnancy history. Screening  You may have the following tests or measurements:  Height, weight, and BMI.  Blood pressure.  Lipid and cholesterol levels. These may be checked every 5 years, or more frequently if you are over 80 years old.  Skin check.  Lung cancer screening. You may have this screening every year starting at age 43 if you have a 30-pack-year history of smoking and currently smoke or have quit within the past 15 years.  Fecal occult blood test (FOBT) of the stool. You may have this test every year starting at age 46.  Flexible sigmoidoscopy or colonoscopy. You may have a sigmoidoscopy every 5 years or a colonoscopy every 10 years starting at age 95.  Hepatitis C blood test.  Hepatitis B blood test.  Sexually transmitted disease (STD) testing.  Diabetes screening. This is done by checking your blood sugar (glucose) after you have not eaten for a while (fasting). You may have this done every 1-3 years.  Mammogram. This may be done every 1-2 years. Talk to your health care provider about when you should start having regular mammograms. This may depend on whether you have a family history of breast cancer.  BRCA-related cancer screening. This may be done if  you have a family history of breast, ovarian, tubal, or peritoneal cancers.  Pelvic exam and Pap test. This may be done every 3 years starting at age 32. Starting at age 29, this may be done every 5 years if you have a Pap test in combination with an HPV test.  Bone  density scan. This is done to screen for osteoporosis. You may have this scan if you are at high risk for osteoporosis. Discuss your test results, treatment options, and if necessary, the need for more tests with your health care provider. Vaccines  Your health care provider may recommend certain vaccines, such as:  Influenza vaccine. This is recommended every year.  Tetanus, diphtheria, and acellular pertussis (Tdap, Td) vaccine. You may need a Td booster every 10 years.  Zoster vaccine. You may need this after age 1.  Pneumococcal 13-valent conjugate (PCV13) vaccine. You may need this if you have certain conditions and were not previously vaccinated.  Pneumococcal polysaccharide (PPSV23) vaccine. You may need one or two doses if you smoke cigarettes or if you have certain conditions. Talk to your health care provider about which screenings and vaccines you need and how often you need them. This information is not intended to replace advice given to you by your health care provider. Make sure you discuss any questions you have with your health care provider. Document Released: 08/07/2015 Document Revised: 03/30/2016 Document Reviewed: 05/12/2015 Elsevier Interactive Patient Education  2017 [employer] Prevention in the Home Falls can cause injuries. They can happen to people of all ages. There are many things you can do to make your home safe and to help prevent falls. What can I do on the outside of my home?  Regularly fix the edges of walkways and driveways and fix any cracks.  Remove anything that might make you trip as you walk through a door, such as a raised step or threshold.  Trim any bushes or trees on the path to your home.  Use bright outdoor lighting.  Clear any walking paths of anything that might make someone trip, such as rocks or tools.  Regularly check to see if handrails are loose or broken. Make sure that both sides of any steps have  handrails.  Any raised decks and porches should have guardrails on the edges.  Have any leaves, snow, or ice cleared regularly.  Use sand or salt on walking paths during winter.  Clean up any spills in your garage right away. This includes oil or grease spills. What can I do in the bathroom?  Use night lights.  Install grab bars by the toilet and in the tub and shower. Do not use towel bars as grab bars.  Use non-skid mats or decals in the tub or shower.  If you need to sit down in the shower, use a plastic, non-slip stool.  Keep the floor dry. Clean up any water that spills on the floor as soon as it happens.  Remove soap buildup in the tub or shower regularly.  Attach bath mats securely with double-sided non-slip rug tape.  Do not have throw rugs and other things on the floor that can make you trip. What can I do in the bedroom?  Use night lights.  Make sure that you have a light by your bed that is easy to reach.  Do not use any sheets or blankets that are too big for your bed. They should not hang down onto the floor.  Have a firm chair that has side arms. You can use this for support while you get dressed.  Do not have throw rugs and other things on the floor that can make you trip. What can I do in the kitchen?  Clean up any spills right away.  Avoid walking on wet floors.  Keep items that you use a lot in easy-to-reach places.  If you need to reach something above you, use a strong step stool that has a grab bar.  Keep electrical cords out of the way.  Do not use floor polish or wax that makes floors slippery. If you must use wax, use non-skid floor wax.  Do not have throw rugs and other things on the floor that can make you trip. What can I do with my stairs?  Do not leave any items on the stairs.  Make sure that there are handrails on both sides of the stairs and use them. Fix handrails that are broken or loose. Make sure that handrails are as long as  the stairways.  Check any carpeting to make sure that it is firmly attached to the stairs. Fix any carpet that is loose or worn.  Avoid having throw rugs at the top or bottom of the stairs. If you do have throw rugs, attach them to the floor with carpet tape.  Make sure that you have a light switch at the top of the stairs and the bottom of the stairs. If you do not have them, ask someone to add them for you. What else can I do to help prevent falls?  Wear shoes that:  Do not have high heels.  Have rubber bottoms.  Are comfortable and fit you well.  Are closed at the toe. Do not wear sandals.  If you use a stepladder:  Make sure that it is fully opened. Do not climb a closed stepladder.  Make sure that both sides of the stepladder are locked into place.  Ask someone to hold it for you, if possible.  Clearly mark and make sure that you can see:  Any grab bars or handrails.  First and last steps.  Where the edge of each step is.  Use tools that help you move around (mobility aids) if they are needed. These include:  Canes.  Walkers.  Scooters.  Crutches.  Turn on the lights when you go into a dark area. Replace any light bulbs as soon as they burn out.  Set up your furniture so you have a clear path. Avoid moving your furniture around.  If any of your floors are uneven, fix them.  If there are any pets around you, be aware of where they are.  Review your medicines with your doctor. Some medicines can make you feel dizzy. This can increase your chance of falling. Ask your doctor what other things that you can do to help prevent falls. This information is not intended to replace advice given to you by your health care provider. Make sure you discuss any questions you have with your health care provider. Document Released: 05/07/2009 Document Revised: 12/17/2015 Document Reviewed: 08/15/2014 Elsevier Interactive Patient Education  2017 [employer].

## 2016-12-09 ENCOUNTER — Other Ambulatory Visit
Admission: RE | Admit: 2016-12-09 | Discharge: 2016-12-09 | Disposition: A | Discharge status: Home / Self Care | Payer: Medicare HMO | Source: Ambulatory Visit | Attending: Physician Assistant | Admitting: Physician Assistant

## 2016-12-09 ENCOUNTER — Ambulatory Visit (INDEPENDENT_AMBULATORY_CARE_PROVIDER_SITE_OTHER): Payer: Medicare HMO | Admitting: Physician Assistant

## 2016-12-09 ENCOUNTER — Encounter: Payer: Self-pay | Admitting: Physician Assistant

## 2016-12-09 VITALS — BP 110/62 | HR 66 | Temp 98.2°F | Resp 16 | Ht 65.0 in | Wt 178.0 lb

## 2016-12-09 DIAGNOSIS — I10 Essential (primary) hypertension: Secondary | ICD-10-CM

## 2016-12-09 DIAGNOSIS — Z6829 Body mass index (BMI) 29.0-29.9, adult: Secondary | ICD-10-CM

## 2016-12-09 DIAGNOSIS — Z131 Encounter for screening for diabetes mellitus: Secondary | ICD-10-CM | POA: Diagnosis not present

## 2016-12-09 DIAGNOSIS — R311 Benign essential microscopic hematuria: Secondary | ICD-10-CM | POA: Diagnosis not present

## 2016-12-09 DIAGNOSIS — Z0000 Encounter for general adult medical examination without abnormal findings: Secondary | ICD-10-CM

## 2016-12-09 DIAGNOSIS — Z1231 Encounter for screening mammogram for malignant neoplasm of breast: Secondary | ICD-10-CM

## 2016-12-09 DIAGNOSIS — E7800 Pure hypercholesterolemia, unspecified: Secondary | ICD-10-CM | POA: Insufficient documentation

## 2016-12-09 DIAGNOSIS — R059 Cough, unspecified: Secondary | ICD-10-CM

## 2016-12-09 DIAGNOSIS — E559 Vitamin D deficiency, unspecified: Secondary | ICD-10-CM | POA: Diagnosis not present

## 2016-12-09 DIAGNOSIS — D649 Anemia, unspecified: Secondary | ICD-10-CM | POA: Diagnosis not present

## 2016-12-09 DIAGNOSIS — Z1239 Encounter for other screening for malignant neoplasm of breast: Secondary | ICD-10-CM

## 2016-12-09 DIAGNOSIS — Z124 Encounter for screening for malignant neoplasm of cervix: Secondary | ICD-10-CM | POA: Diagnosis not present

## 2016-12-09 DIAGNOSIS — Z126 Encounter for screening for malignant neoplasm of bladder: Secondary | ICD-10-CM | POA: Diagnosis not present

## 2016-12-09 DIAGNOSIS — R05 Cough: Secondary | ICD-10-CM

## 2016-12-09 LAB — CBC WITH DIFFERENTIAL/PLATELET
BASOS ABS: 0.1 10*3/uL (ref 0–0.1)
Basophils Relative: 1 %
EOS ABS: 0.1 10*3/uL (ref 0–0.7)
Eosinophils Relative: 1 %
HCT: 40.3 % (ref 35.0–47.0)
HEMOGLOBIN: 13.5 g/dL (ref 12.0–16.0)
LYMPHS ABS: 2.9 10*3/uL (ref 1.0–3.6)
Lymphocytes Relative: 44 %
MCH: 27.3 pg (ref 26.0–34.0)
MCHC: 33.5 g/dL (ref 32.0–36.0)
MCV: 81.4 fL (ref 80.0–100.0)
Monocytes Absolute: 0.5 10*3/uL (ref 0.2–0.9)
Monocytes Relative: 7 %
Neutro Abs: 3.1 10*3/uL (ref 1.4–6.5)
Neutrophils Relative %: 47 %
PLATELETS: 305 10*3/uL (ref 150–440)
RBC: 4.95 MIL/uL (ref 3.80–5.20)
RDW: 13.7 % (ref 11.5–14.5)
WBC: 6.6 10*3/uL (ref 3.6–11.0)

## 2016-12-09 LAB — POCT URINALYSIS DIPSTICK
Bilirubin, UA: NEGATIVE
Glucose, UA: NEGATIVE
Ketones, UA: NEGATIVE
LEUKOCYTES UA: NEGATIVE
NITRITE UA: NEGATIVE
PH UA: 6 (ref 5.0–8.0)
Spec Grav, UA: 1.015 (ref 1.010–1.025)
UROBILINOGEN UA: 0.2 U/dL

## 2016-12-09 LAB — COMPREHENSIVE METABOLIC PANEL WITH GFR
ALT: 9 U/L — ABNORMAL LOW (ref 14–54)
AST: 20 U/L (ref 15–41)
Albumin: 3.9 g/dL (ref 3.5–5.0)
Alkaline Phosphatase: 94 U/L (ref 38–126)
Anion gap: 7 (ref 5–15)
BUN: 8 mg/dL (ref 6–20)
CO2: 28 mmol/L (ref 22–32)
Calcium: 9.1 mg/dL (ref 8.9–10.3)
Chloride: 104 mmol/L (ref 101–111)
Creatinine, Ser: 0.58 mg/dL (ref 0.44–1.00)
GFR calc Af Amer: >60 mL/min
GFR calc non Af Amer: >60 mL/min
Glucose, Bld: 106 mg/dL — ABNORMAL HIGH (ref 65–99)
Potassium: 3.4 mmol/L — ABNORMAL LOW (ref 3.5–5.1)
Sodium: 139 mmol/L (ref 135–145)
Total Bilirubin: 0.4 mg/dL (ref 0.3–1.2)
Total Protein: 7.4 g/dL (ref 6.5–8.1)

## 2016-12-09 LAB — LIPID PANEL
Cholesterol: 147 mg/dL (ref 0–200)
HDL: 44 mg/dL
LDL Cholesterol: 88 mg/dL (ref 0–99)
Total CHOL/HDL Ratio: 3.3 ratio
Triglycerides: 77 mg/dL
VLDL: 15 mg/dL (ref 0–40)

## 2016-12-09 LAB — TSH: TSH: 0.72 u[IU]/mL (ref 0.350–4.500)

## 2016-12-09 MED ORDER — BENZONATATE 100 MG PO CAPS: 100.0000 mg | ORAL_CAPSULE | Freq: Two times a day (BID) | ORAL | 0 refills | Status: DC | PRN

## 2016-12-09 NOTE — Progress Notes (Signed)
Patient: [name], Female    DOB: June 12, 1956, 61 y.o.   MRN: [id number] Visit Date: 12/09/2016  Today's Provider: Mar Daring, PA-C   Chief Complaint  Patient presents with  . Annual Exam   Subjective:    Annual physical exam MEHLANI BLANKENBURG is a 61 y.o. female who presents today for health maintenance and complete physical. She feels well. She reports exercising active with daily activities. She reports she is sleeping well. 09/08/15 AWE 01/16/13 Pap-neg 10/16/15 Mammogram-BI-RADS 1 10/28/14 Colonoscopy-polyp, recheck in 5 years. ----------------------------------------------------------------- Patient is requesting refill for dexlansoprazole 60 mg and sucralfate 1 gram. Patient reports good tolerance and symptom control with medications.   Patient C/O of worsening cramps in hands. Patient reports GI told her to D/C meloxicam and is now taking OTC Tylenol 1000 mg daily for pain.  Review of Systems  Constitutional: Negative.   HENT: Negative.   Eyes: Negative.   Respiratory: Positive for cough.   Cardiovascular: Negative.   Gastrointestinal: Negative.   Endocrine: Negative.   Genitourinary: Negative.   Musculoskeletal: Positive for arthralgias and myalgias.  Skin: Negative.   Allergic/Immunologic: Negative.   Neurological: Negative.   Hematological: Negative.   Psychiatric/Behavioral: Negative.     Social History      She  reports that she has never smoked. She has never used smokeless tobacco. She reports that she does not drink alcohol or use drugs.       Social History   Social History  . Marital status: Single    Spouse name: N/A  . Number of children: 3  . Years of education: N/A   Social History Main Topics  . Smoking status: Never Smoker  . Smokeless tobacco: Never Used  . Alcohol use No  . Drug use: No  . Sexual activity: Not Asked   Other Topics Concern  . None   Social History Narrative  . None    Past Medical History:   Diagnosis Date  . Allergy   . Anemia    takes Ferrous Sulfate daily  . Arthritis   . GERD (gastroesophageal reflux disease)    takes OTC med  . Hyperlipidemia   . Hypertension    takes Amlodipine daily  . Low back pain    occasionally     Patient Active Problem List   Diagnosis Date Noted  . Need for hepatitis C screening test 12/16/2015  . Gastritis   . Epigastric pain 03/24/2015  . BPPV (benign paroxysmal positional vertigo) 03/24/2015  . Right hip pain 01/07/2015  . Absolute anemia 01/06/2015  . Body mass index (BMI) of 28.0-28.9 in adult 01/06/2015  . Cervical disc disease 01/06/2015  . Atypical chest pain 01/06/2015  . Generalized pruritus 01/06/2015  . Acid reflux 01/06/2015  . Cervicogenic headache 01/06/2015  . Hypercholesteremia 01/06/2015  . High potassium 01/06/2015  . LBP (low back pain) 01/06/2015  . Pseudoarthrosis of cervical spine ([place]) 08/26/2013  . Accumulation of fluid in tissues 12/24/2009  . Avitaminosis D 10/15/2009  . Cardiac conduction disorder 09/30/2008  . Essential (primary) hypertension 09/30/2008  . Blood in the urine 09/30/2008    Past Surgical History:  Procedure Laterality Date  . ANTERIOR CERVICAL DECOMP/DISCECTOMY FUSION  10/19/2011   Procedure: ANTERIOR CERVICAL DECOMPRESSION/DISCECTOMY FUSION 1 LEVEL/HARDWARE REMOVAL;  Surgeon: Elaina Hoops, MD;  Location: [facility];  Service: Neurosurgery;  Laterality: N/A;  Cervical four - five  Anterior cervical decompression fusion with redo at six - seven  Rm 33  . COLONOSCOPY  08/2014   polyps  . ESOPHAGOGASTRODUODENOSCOPY (EGD) WITH PROPOFOL N/A 06/23/2015   Procedure: ESOPHAGOGASTRODUODENOSCOPY (EGD) WITH PROPOFOL;  Surgeon: [name], MD;  Location: [facility];  Service: Endoscopy;  Laterality: N/A;  . NECK SURGERY    . POSTERIOR CERVICAL FUSION/FORAMINOTOMY N/A 08/26/2013   Procedure: C/4-5,C/5-6,C/6-7 Posterior Cervical Fusion w/lateral mass fixation;  Surgeon: Elaina Hoops, MD;   Location: [facility];  Service: Neurosurgery;  Laterality: N/A;  . TUBAL LIGATION      Family History        Family Status  Relation Status  . Mother Alive  . Daughter Deceased       unknown causes  . Father Other  . Sister Alive  . Brother Alive  . Son Alive  . Sister Alive  . Sister Alive  . Son Alive  . Neg Hx (Not Specified)        Her family history includes Heart disease in her mother; Hypertension in her mother.     Allergies  Allergen Reactions  . Aspirin Nausea Only  . Hydrochlorothiazide     Rash  . Lisinopril     Other reaction(s): Unknown  . Sulfa Antibiotics Hives and Nausea Only     Current Outpatient Prescriptions:  .  amLODipine (NORVASC) 10 MG tablet, Take 1 tablet (10 mg total) by mouth daily., Disp: 30 tablet, Rfl: 6 .  Cholecalciferol (VITAMIN D) 2000 UNITS tablet, Take 2,000 Units by mouth daily., Disp: , Rfl:  .  dexlansoprazole (DEXILANT) 60 MG capsule, Take 1 capsule (60 mg total) by mouth daily., Disp: 90 capsule, Rfl: 1 .  ferrous sulfate 325 (65 FE) MG tablet, Take 325 mg by mouth daily with breakfast., Disp: , Rfl:  .  fluticasone (FLONASE) 50 MCG/ACT nasal spray, Place 2 sprays into both nostrils daily., Disp: 16 g, Rfl: 5 .  Omega-3 Fatty Acids (FISH OIL) 1200 MG CAPS, Take 1 capsule by mouth daily., Disp: , Rfl:  .  Potassium 75 MG TABS, Take by mouth., Disp: , Rfl:  .  simvastatin (ZOCOR) 20 MG tablet, Take 1 tablet (20 mg total) by mouth every evening., Disp: 30 tablet, Rfl: 5 .  sucralfate (CARAFATE) 1 g tablet, Take 1 tablet (1 g total) by mouth 4 (four) times daily -  with meals and at bedtime., Disp: 120 tablet, Rfl: 5   Patient Care Team: Mar Daring, PA-C as PCP - General (Family Medicine) Carmon Ginsberg, [place] as Referring Physician (Family Medicine)      Objective:   Vitals: BP 110/62 (BP Location: Left Arm, Patient Position: Sitting, Cuff Size: Large)   Pulse 66   Temp 98.2 F (36.8 C) (Oral)   Resp 16   Ht 5'  5" (1.651 m)   Wt 178 lb (80.7 kg)   SpO2 98%   BMI 29.62 kg/m    Vitals:   12/09/16 0917  BP: 110/62  Pulse: 66  Resp: 16  Temp: 98.2 F (36.8 C)  TempSrc: Oral  SpO2: 98%  Weight: 178 lb (80.7 kg)  Height: 5\' 5"  (1.651 m)     Physical Exam  Constitutional: She is oriented to person, place, and time. She appears well-developed and well-nourished. No distress.  HENT:  Head: Normocephalic and atraumatic.  Right Ear: Hearing, tympanic membrane, external ear and ear canal normal.  Left Ear: Hearing, tympanic membrane, external ear and ear canal normal.  Nose: Nose normal.  Mouth/Throat: Uvula is midline, oropharynx is clear and  moist and mucous membranes are normal. No oropharyngeal exudate.  Eyes: Conjunctivae and EOM are normal. Pupils are equal, round, and reactive to light. Right eye exhibits no discharge. Left eye exhibits no discharge. No scleral icterus.  Neck: Normal range of motion. Neck supple. No JVD present. Carotid bruit is not present. No tracheal deviation present. No thyromegaly present.  Cardiovascular: Normal rate, regular rhythm, normal heart sounds and intact distal pulses.  Exam reveals no gallop and no friction rub.   No murmur heard. Pulmonary/Chest: Effort normal and breath sounds normal. No respiratory distress. She has no wheezes. She has no rales. She exhibits no tenderness. Right breast exhibits no inverted nipple, no mass, no nipple discharge, no skin change and no tenderness. Left breast exhibits no inverted nipple, no mass, no nipple discharge, no skin change and no tenderness. Breasts are symmetrical.  Abdominal: Soft. Bowel sounds are normal. She exhibits no distension and no mass. There is no tenderness. There is no rebound and no guarding. Hernia confirmed negative in the right inguinal area and confirmed negative in the left inguinal area.  Genitourinary: Rectum normal, vagina normal and uterus normal. No breast swelling, tenderness, discharge or  bleeding. Pelvic exam was performed with patient supine. There is no rash, tenderness, lesion or injury on the right labia. There is no rash, tenderness, lesion or injury on the left labia. Cervix exhibits no motion tenderness, no discharge and no friability. Right adnexum displays no mass, no tenderness and no fullness. Left adnexum displays no mass, no tenderness and no fullness. No erythema, tenderness or bleeding in the vagina. No signs of injury around the vagina. No vaginal discharge found.  Musculoskeletal: Normal range of motion. She exhibits no edema or tenderness.  Lymphadenopathy:    She has no cervical adenopathy.       Right: No inguinal adenopathy present.       Left: No inguinal adenopathy present.  Neurological: She is alert and oriented to person, place, and time. She has normal reflexes. No cranial nerve deficit. Coordination normal.  Skin: Skin is warm and dry. No rash noted. She is not diaphoretic.  Psychiatric: She has a normal mood and affect. Her behavior is normal. Judgment and thought content normal.  Vitals reviewed.    Depression Screen PHQ 2/9 Scores 12/07/2016 12/07/2016 09/08/2015 01/07/2015  PHQ - 2 Score 0 0 1 1  PHQ- 9 Score 0 - - -   6CIT Screen 12/07/2016  What Year? 0 points  What month? 0 points  What time? 0 points  Count back from 20 0 points  Months in reverse 0 points  Repeat phrase 0 points  Total Score 0       Home Exercise  12/07/2016  Current Exercise Habits The patient does not participate in regular exercise at present  Exercise limited by: orthopedic condition(s)    Fall Risk  12/07/2016 09/08/2015 01/07/2015  Falls in the past year? No No No   Audit-C Alcohol Use Screening  Question Answer Points  How often do you have alcoholic drink? never 0  On days you do drink alcohol, how many drinks do you typically consume? 0 0  How oftey will you drink 6 or more in a total? never 0  Total Score:  0   A score of 3 or more in women, and 4 or  more in men indicates increased risk for alcohol abuse, EXCEPT if all of the points are from question 1.  Assessment & Plan:     Routine Health Maintenance and Physical Exam  Exercise Activities and Dietary recommendations Goals    . Increase water intake          Recommend increasing water intake to 4 glasses a day.       Immunization History  Administered Date(s) Administered  . Tdap 09/30/2008    Health Maintenance  Topic Date Due  . PAP SMEAR  01/17/2016  . INFLUENZA VACCINE  02/22/2017  . MAMMOGRAM  10/15/2017  . TETANUS/TDAP  10/01/2018  . COLONOSCOPY  10/27/2024  . Hepatitis C Screening  Completed  . HIV Screening  Completed     Discussed health benefits of physical activity, and encouraged her to engage in regular exercise appropriate for her age and condition.    1. Annual physical exam Normal physical exam today. Will check labs as below and f/u pending lab results. If labs are stable and WNL she will not need to have these rechecked for one year at her next annual physical exam. She is to call the office in the meantime if she has any acute issue, questions or concerns.  2. Breast cancer screening Patient already has mammogram ordered. Breast exam was normal today. She does not perform regular breast exams. Educated today.  - MM DIGITAL SCREENING BILATERAL; Future  3. Cervical cancer screening Pap collected today. Will send as below and f/u pending results. - Pap IG and HPV (high risk) DNA detection  4. Essential (primary) hypertension Stable. Continue amlodipine 10mg . Will check labs as below and f/u pending results. - Comprehensive metabolic panel - TSH - HgB A1c  5. Anemia, unspecified type On ferrous sulfate 325mg . Will check labs as below and f/u pending results. - CBC with Differential/Platelet  6. Hypercholesteremia Stable. Continue simvastatin 20mg . Will check labs as below and f/u pending results. - Lipid Panel With LDL/HDL  Ratio - HgB A1c  7. Avitaminosis D H/O this and on 2000 IU daily.  Postmenopausal estrogen deficiency.  - VITAMIN D 25 Hydroxy (Vit-D Deficiency, Fractures)  8. BMI 29.0-29.9,adult Counseled patient on healthy lifestyle modifications including dieting and exercise.  - HgB A1c  9. Diabetes mellitus screening Will check labs as below and f/u pending results. - HgB A1c  10. Cough Worsenng cough. Previously given narcotic cough syrup but not covered by her insurance. Will send tessalon perles to see if better covered.  - benzonatate (TESSALON) 100 MG capsule; Take 1 capsule (100 mg total) by mouth 2 (two) times daily as needed for cough.  Dispense: 20 capsule; Refill: 0  11. Benign essential microscopic hematuria H/O hematuria and moderate hematuria noted on UA today. Will send for microscopic evaluation.  - Urinalysis, microscopic only  --------------------------------------------------------------------    Mar Daring, PA-C  [facility]

## 2016-12-09 NOTE — Patient Instructions (Addendum)
Please call the [facility] at [facility] to schedule this at [phone].    Health Maintenance for Postmenopausal Women Menopause is a normal process in which your reproductive ability comes to an end. This process happens gradually over a span of months to years, usually between the ages of 35 and 34. Menopause is complete when you have missed 12 consecutive menstrual periods. It is important to talk with your health care provider about some of the most common conditions that affect postmenopausal women, such as heart disease, cancer, and bone loss (osteoporosis). Adopting a healthy lifestyle and getting preventive care can help to promote your health and wellness. Those actions can also lower your chances of developing some of these common conditions. What should I know about menopause? During menopause, you may experience a number of symptoms, such as:  Moderate-to-severe hot flashes.  Night sweats.  Decrease in sex drive.  Mood swings.  Headaches.  Tiredness.  Irritability.  Memory problems.  Insomnia. Choosing to treat or not to treat menopausal changes is an individual decision that you make with your health care provider. What should I know about hormone replacement therapy and supplements? Hormone therapy products are effective for treating symptoms that are associated with menopause, such as hot flashes and night sweats. Hormone replacement carries certain risks, especially as you become older. If you are thinking about using estrogen or estrogen with progestin treatments, discuss the benefits and risks with your health care provider. What should I know about heart disease and stroke? Heart disease, heart attack, and stroke become more likely as you age. This may be due, in part, to the hormonal changes that your body experiences during menopause. These can affect how your body processes dietary fats, triglycerides, and cholesterol. Heart  attack and stroke are both medical emergencies. There are many things that you can do to help prevent heart disease and stroke:  Have your blood pressure checked at least every 1-2 years. High blood pressure causes heart disease and increases the risk of stroke.  If you are 51-21 years old, ask your health care provider if you should take aspirin to prevent a heart attack or a stroke.  Do not use any tobacco products, including cigarettes, chewing tobacco, or electronic cigarettes. If you need help quitting, ask your health care provider.  It is important to eat a healthy diet and maintain a healthy weight.  Be sure to include plenty of vegetables, fruits, low-fat dairy products, and lean protein.  Avoid eating foods that are high in solid fats, added sugars, or salt (sodium).  Get regular exercise. This is one of the most important things that you can do for your health.  Try to exercise for at least 150 minutes each week. The type of exercise that you do should increase your heart rate and make you sweat. This is known as moderate-intensity exercise.  Try to do strengthening exercises at least twice each week. Do these in addition to the moderate-intensity exercise.  Know your numbers.Ask your health care provider to check your cholesterol and your blood glucose. Continue to have your blood tested as directed by your health care provider. What should I know about cancer screening? There are several types of cancer. Take the following steps to reduce your risk and to catch any cancer development as early as possible. Breast Cancer  Practice breast self-awareness.  This means understanding how your breasts normally appear and feel.  It also means doing regular breast self-exams.  Let your health care provider know about any changes, no matter how small.  If you are 22 or older, have a clinician do a breast exam (clinical breast exam or CBE) every year. Depending on your age, family  history, and medical history, it may be recommended that you also have a yearly breast X-ray (mammogram).  If you have a family history of breast cancer, talk with your health care provider about genetic screening.  If you are at high risk for breast cancer, talk with your health care provider about having an MRI and a mammogram every year.  Breast cancer (BRCA) gene test is recommended for women who have family members with BRCA-related cancers. Results of the assessment will determine the need for genetic counseling and BRCA1 and for BRCA2 testing. BRCA-related cancers include these types:  Breast. This occurs in males or females.  Ovarian.  Tubal. This may also be called fallopian tube cancer.  Cancer of the abdominal or pelvic lining (peritoneal cancer).  Prostate.  Pancreatic. Cervical, Uterine, and Ovarian Cancer  Your health care provider may recommend that you be screened regularly for cancer of the pelvic organs. These include your ovaries, uterus, and vagina. This screening involves a pelvic exam, which includes checking for microscopic changes to the surface of your cervix (Pap test).  For women ages 21-65, health care providers may recommend a pelvic exam and a Pap test every three years. For women ages 40-65, they may recommend the Pap test and pelvic exam, combined with testing for human papilloma virus (HPV), every five years. Some types of HPV increase your risk of cervical cancer. Testing for HPV may also be done on women of any age who have unclear Pap test results.  Other health care providers may not recommend any screening for nonpregnant women who are considered low risk for pelvic cancer and have no symptoms. Ask your health care provider if a screening pelvic exam is right for you.  If you have had past treatment for cervical cancer or a condition that could lead to cancer, you need Pap tests and screening for cancer for at least 20 years after your treatment. If Pap  tests have been discontinued for you, your risk factors (such as having a new sexual partner) need to be reassessed to determine if you should start having screenings again. Some women have medical problems that increase the chance of getting cervical cancer. In these cases, your health care provider may recommend that you have screening and Pap tests more often.  If you have a family history of uterine cancer or ovarian cancer, talk with your health care provider about genetic screening.  If you have vaginal bleeding after reaching menopause, tell your health care provider.  There are currently no reliable tests available to screen for ovarian cancer. Lung Cancer  Lung cancer screening is recommended for adults 22-38 years old who are at high risk for lung cancer because of a history of smoking. A yearly low-dose CT scan of the lungs is recommended if you:  Currently smoke.  Have a history of at least 30 pack-years of smoking and you currently smoke or have quit within the past 15 years. A pack-year is smoking an average of one pack of cigarettes per day for one year. Yearly screening should:  Continue until it has been 15 years since you quit.  Stop if you develop a health problem that would prevent you from having lung cancer treatment. Colorectal Cancer  This type of cancer  can be detected and can often be prevented.  Routine colorectal cancer screening usually begins at age 66 and continues through age 15.  If you have risk factors for colon cancer, your health care provider may recommend that you be screened at an earlier age.  If you have a family history of colorectal cancer, talk with your health care provider about genetic screening.  Your health care provider may also recommend using home test kits to check for hidden blood in your stool.  A small camera at the end of a tube can be used to examine your colon directly (sigmoidoscopy or colonoscopy). This is done to check for  the earliest forms of colorectal cancer.  Direct examination of the colon should be repeated every 5-10 years until age 37. However, if early forms of precancerous polyps or small growths are found or if you have a family history or genetic risk for colorectal cancer, you may need to be screened more often. Skin Cancer  Check your skin from head to toe regularly.  Monitor any moles. Be sure to tell your health care provider:  About any new moles or changes in moles, especially if there is a change in a mole's shape or color.  If you have a mole that is larger than the size of a pencil eraser.  If any of your family members has a history of skin cancer, especially at a young age, talk with your health care provider about genetic screening.  Always use sunscreen. Apply sunscreen liberally and repeatedly throughout the day.  Whenever you are outside, protect yourself by wearing long sleeves, pants, a wide-brimmed hat, and sunglasses. What should I know about osteoporosis? Osteoporosis is a condition in which bone destruction happens more quickly than new bone creation. After menopause, you may be at an increased risk for osteoporosis. To help prevent osteoporosis or the bone fractures that can happen because of osteoporosis, the following is recommended:  If you are 26-73 years old, get at least 1,000 mg of calcium and at least 600 mg of vitamin D per day.  If you are older than age 13 but younger than age 11, get at least 1,200 mg of calcium and at least 600 mg of vitamin D per day.  If you are older than age 100, get at least 1,200 mg of calcium and at least 800 mg of vitamin D per day. Smoking and excessive alcohol intake increase the risk of osteoporosis. Eat foods that are rich in calcium and vitamin D, and do weight-bearing exercises several times each week as directed by your health care provider. What should I know about how menopause affects my mental health? Depression may occur at  any age, but it is more common as you become older. Common symptoms of depression include:  Low or sad mood.  Changes in sleep patterns.  Changes in appetite or eating patterns.  Feeling an overall lack of motivation or enjoyment of activities that you previously enjoyed.  Frequent crying spells. Talk with your health care provider if you think that you are experiencing depression. What should I know about immunizations? It is important that you get and maintain your immunizations. These include:  Tetanus, diphtheria, and pertussis (Tdap) booster vaccine.  Influenza every year before the flu season begins.  Pneumonia vaccine.  Shingles vaccine. Your health care provider may also recommend other immunizations. This information is not intended to replace advice given to you by your health care provider. Make sure you discuss any  questions you have with your health care provider. Document Released: 09/02/2005 Document Revised: 01/29/2016 Document Reviewed: 04/14/2015 Elsevier Interactive Patient Education  2017 [employer].

## 2016-12-10 LAB — URINALYSIS, MICROSCOPIC ONLY: Casts: NONE SEEN /LPF

## 2016-12-10 LAB — HEMOGLOBIN A1C
HEMOGLOBIN A1C: 5.9 % — AB (ref 4.8–5.6)
MEAN PLASMA GLUCOSE: 123 mg/dL

## 2016-12-10 LAB — VITAMIN D 25 HYDROXY (VIT D DEFICIENCY, FRACTURES): Vit D, 25-Hydroxy: 28.3 ng/mL — ABNORMAL LOW (ref 30.0–100.0)

## 2016-12-13 LAB — PAP IG AND HPV HIGH-RISK
HPV, HIGH-RISK: NEGATIVE
PAP Smear Comment: 0

## 2016-12-20 ENCOUNTER — Telehealth: Payer: Self-pay

## 2016-12-20 NOTE — Telephone Encounter (Signed)
-----   Message from Mar Daring, [place] sent at 12/20/2016  2:06 PM EDT ----- Urine did not have abnormal amount of red blood cells. Pap was normal and HPV negative. Will repeat pap in 3-5 years.

## 2016-12-20 NOTE — Telephone Encounter (Signed)
lmtcb Emily Drozdowski, CMA  

## 2016-12-21 NOTE — Telephone Encounter (Signed)
I gave pt's her test results and she acknowledged.  Nurses were at lunch.  Thanks [employer]

## 2016-12-30 ENCOUNTER — Ambulatory Visit
Admission: RE | Admit: 2016-12-30 | Discharge: 2016-12-30 | Disposition: A | Discharge status: Home / Self Care | Payer: Medicare HMO | Source: Ambulatory Visit | Attending: Family Medicine | Admitting: Family Medicine

## 2016-12-30 DIAGNOSIS — Z1231 Encounter for screening mammogram for malignant neoplasm of breast: Secondary | ICD-10-CM | POA: Insufficient documentation

## 2017-01-18 ENCOUNTER — Encounter: Payer: Self-pay | Admitting: Physician Assistant

## 2017-01-18 ENCOUNTER — Ambulatory Visit (INDEPENDENT_AMBULATORY_CARE_PROVIDER_SITE_OTHER): Payer: Medicare HMO | Admitting: Physician Assistant

## 2017-01-18 VITALS — BP 120/80 | HR 64 | Temp 98.0°F | Resp 16 | Ht 65.0 in | Wt 172.0 lb

## 2017-01-18 DIAGNOSIS — T1592XA Foreign body on external eye, part unspecified, left eye, initial encounter: Secondary | ICD-10-CM

## 2017-01-18 DIAGNOSIS — H1013 Acute atopic conjunctivitis, bilateral: Secondary | ICD-10-CM | POA: Diagnosis not present

## 2017-01-18 DIAGNOSIS — B360 Pityriasis versicolor: Secondary | ICD-10-CM

## 2017-01-18 MED ORDER — KETOCONAZOLE 2 % EX SHAM: 1.0000 "application " | MEDICATED_SHAMPOO | CUTANEOUS | 0 refills | Status: DC

## 2017-01-18 NOTE — Patient Instructions (Signed)
Allergy eye drops Restart oral allergy medication Ketoconazole shampoo apply to affected area twice weekly   Tinea Versicolor Tinea versicolor is a skin infection that is caused by a type of yeast. It causes a rash that shows up as light or dark patches on the skin. It often occurs on the chest, back, neck, or upper arms. The condition usually does not cause other problems. In most cases, it goes away in a few weeks with treatment. The infection cannot be spread by person to another person. Follow these instructions at home:  Take medicines only as told by your doctor.  Scrub your skin every day with a dandruff shampoo as told by your doctor.  Do not scratch your skin in the rash area.  Avoid places that are hot and humid.  Do not use tanning booths.  Try to avoid sweating a lot. Contact a doctor if:  Your symptoms get worse.  You have a fever.  You have redness, swelling, or pain in the area of your rash.  You have fluid, blood, or pus coming from your rash.  Your rash comes back after treatment. This information is not intended to replace advice given to you by your health care provider. Make sure you discuss any questions you have with your health care provider. Document Released: 06/23/2008 Document Revised: 03/13/2016 Document Reviewed: 04/22/2014 Elsevier Interactive Patient Education  2018 [employer].  Allergic Conjunctivitis A clear membrane (conjunctiva) covers the white part of your eye and the inner surface of your eyelid. Allergic conjunctivitis happens when this membrane has inflammation. This is caused by allergies. Common causes of allergic reactions (allergens)include:  Outdoor allergens, such as: ? Pollen. ? Grass and weeds. ? Mold spores.  Indoor allergens, such as: ? Dust. ? Smoke. ? Mold. ? Pet dander. ? Animal hair.  This condition can make your eye red or pink. It can also make your eye feel itchy. This condition cannot be spread from one  person to another person (is not contagious). Follow these instructions at home:  Try not to be around things that you are allergic to.  Take or apply over-the-counter and prescription medicines only as told by your doctor. These include any eye drops.  Place a cool, clean washcloth on your eye for 10-20 minutes. Do this 3-4 times a day.  Do not touch or rub your eyes.  Do not wear contact lenses until the inflammation is gone. Wear glasses instead.  Do not wear eye makeup until the inflammation is gone.  Keep all follow-up visits as told by your doctor. This is important. Contact a doctor if:  Your symptoms get worse.  Your symptoms do not get better with treatment.  You have mild eye pain.  You are sensitive to light,  You have spots or blisters on your eyes.  You have pus coming from your eye.  You have a fever. Get help right away if:  You have redness, swelling, or other symptoms in only one eye.  Your vision is blurry.  You have vision changes.  You have very bad eye pain. Summary  Allergic conjunctivitis is caused by allergies. It can make your eye red or pink, and it can make your eye feel itchy.  This condition cannot be spread from one person to another person (is not contagious).  Try not to be around things that you are allergic to.  Take or apply over-the-counter and prescription medicines only as told by your doctor. These include any eye drops.  Contact your doctor if your symptoms get worse or they do not get better with treatment. This information is not intended to replace advice given to you by your health care provider. Make sure you discuss any questions you have with your health care provider. Document Released: 12/29/2009 Document Revised: 03/04/2016 Document Reviewed: 03/04/2016 Elsevier Interactive Patient Education  2017 [employer].

## 2017-01-18 NOTE — Progress Notes (Signed)
Patient: [name] Female    DOB: Mar 04, 1956   61 y.o.   MRN: [id number] Visit Date: 01/18/2017  Today's Provider: Mar Daring, PA-C   Chief Complaint  Patient presents with  . Eye Problem  . Rash   Subjective:    HPI Patient here today C/O left eye redness, watery, and burning sensation x's 3 days. Patient denies any discharge, or visual disturbance. Patient reports she may have had a FB in her eye. Patient reports that Sunday she felt something in her eye after passing someone doing the yard with a weed eater.   Patient C/O of rash, discoloration on arms and chest x's 1 month. Patient reports rash is itchy and has tried OTC creams and reports mild improvement with creams.    Allergies  Allergen Reactions  . Aspirin Nausea Only  . Hydrochlorothiazide     Rash  . Lisinopril     Other reaction(s): Unknown  . Sulfa Antibiotics Hives and Nausea Only     Current Outpatient Prescriptions:  .  acetaminophen (TYLENOL) 500 MG tablet, Take 1,000 mg by mouth every 6 (six) hours as needed., Disp: , Rfl:  .  amLODipine (NORVASC) 10 MG tablet, Take 1 tablet (10 mg total) by mouth daily., Disp: 30 tablet, Rfl: 6 .  benzonatate (TESSALON) 100 MG capsule, Take 1 capsule (100 mg total) by mouth 2 (two) times daily as needed for cough., Disp: 20 capsule, Rfl: 0 .  Cholecalciferol (VITAMIN D) 2000 UNITS tablet, Take 2,000 Units by mouth daily., Disp: , Rfl:  .  dexlansoprazole (DEXILANT) 60 MG capsule, Take 1 capsule (60 mg total) by mouth daily., Disp: 90 capsule, Rfl: 1 .  diphenhydrAMINE (BENADRYL) 25 MG tablet, Take 25 mg by mouth as needed., Disp: , Rfl:  .  ferrous sulfate 325 (65 FE) MG tablet, Take 325 mg by mouth daily with breakfast., Disp: , Rfl:  .  fluticasone (FLONASE) 50 MCG/ACT nasal spray, Place 2 sprays into both nostrils daily., Disp: 16 g, Rfl: 5 .  Omega-3 Fatty Acids (FISH OIL) 1200 MG CAPS, Take 1 capsule by mouth daily., Disp: , Rfl:  .  Potassium 75  MG TABS, Take by mouth., Disp: , Rfl:  .  simvastatin (ZOCOR) 20 MG tablet, Take 1 tablet (20 mg total) by mouth every evening., Disp: 30 tablet, Rfl: 5 .  sucralfate (CARAFATE) 1 g tablet, Take 1 tablet (1 g total) by mouth 4 (four) times daily -  with meals and at bedtime., Disp: 120 tablet, Rfl: 5  Review of Systems  Constitutional: Negative.   Eyes: Positive for pain (burning not pain), discharge (clear), redness and itching. Negative for photophobia and visual disturbance.  Respiratory: Negative.   Cardiovascular: Negative.   Gastrointestinal: Negative.   Skin: Positive for color change and rash.    Social History  Substance Use Topics  . Smoking status: Never Smoker  . Smokeless tobacco: Never Used  . Alcohol use No   Objective:   BP 120/80 (BP Location: Left Arm, Patient Position: Sitting, Cuff Size: Large)   Pulse 64   Temp 98 F (36.7 C) (Oral)   Resp 16   Ht 5\' 5"  (1.651 m)   Wt 172 lb (78 kg)   BMI 28.62 kg/m  Vitals:   01/18/17 0806  BP: 120/80  Pulse: 64  Resp: 16  Temp: 98 F (36.7 C)  TempSrc: Oral  Weight: 172 lb (78 kg)  Height: 5\' 5"  (1.651 m)  Physical Exam  Constitutional: She appears well-developed and well-nourished. No distress.  HENT:  Head: Normocephalic and atraumatic.  Right Ear: Hearing, tympanic membrane, external ear and ear canal normal.  Left Ear: Hearing, tympanic membrane, external ear and ear canal normal.  Nose: Nose normal.  Mouth/Throat: Uvula is midline, oropharynx is clear and moist and mucous membranes are normal. No oropharyngeal exudate.  Eyes: EOM are normal. Pupils are equal, round, and reactive to light. Lids are everted and swept, no foreign bodies found. Right eye exhibits discharge (clear). No foreign body present in the right eye. Left eye exhibits discharge (clear). No foreign body present in the left eye. Right conjunctiva is injected. Right conjunctiva has no hemorrhage. Left conjunctiva is injected. Left  conjunctiva has no hemorrhage. No scleral icterus.  Neck: Normal range of motion. Neck supple. No tracheal deviation present. No thyromegaly present.  Cardiovascular: Normal rate, regular rhythm and normal heart sounds.  Exam reveals no gallop and no friction rub.   No murmur heard. Pulmonary/Chest: Effort normal and breath sounds normal. No stridor. No respiratory distress. She has no wheezes. She has no rales.  Lymphadenopathy:    She has no cervical adenopathy.  Skin: Skin is warm and dry. She is not diaphoretic.  Diffuse rash located on arms and chest that shows light discoloration in spotty patches  Vitals reviewed.      Assessment & Plan:     1. Allergic conjunctivitis of both eyes Suspect allergic conjunctivitis as both eyes are red and irritated. She suspects she may have had something in the left eye after driving by someone weedeating but feels she was able to get it out yesterday. No corneal abrasion noted. Advised to use allergy eye drops. She is to call if she develops eye pain or visual changes. She is not established with any ophthalmologist.   2. Foreign body in eye, left, initial encounter See above medical treatment plan.  3. Tinea versicolor Rash on body c/w tinea versicolor. Advised to use ketoconazole shampoo on area affected twice weekly. Once she runs out she may use Selsum blue if still needed. She voices understanding.  - ketoconazole (NIZORAL) 2 % shampoo; Apply 1 application topically 2 (two) times a week.  Dispense: 120 mL; Refill: 0       Mar Daring, PA-C  [facility]

## 2017-03-03 ENCOUNTER — Encounter: Payer: Self-pay | Admitting: Family Medicine

## 2017-03-03 ENCOUNTER — Ambulatory Visit (INDEPENDENT_AMBULATORY_CARE_PROVIDER_SITE_OTHER): Payer: Medicare HMO | Admitting: Family Medicine

## 2017-03-03 VITALS — BP 120/60 | HR 72 | Temp 97.6°F | Resp 16 | Wt 171.0 lb

## 2017-03-03 DIAGNOSIS — M545 Low back pain, unspecified: Secondary | ICD-10-CM

## 2017-03-03 MED ORDER — NAPROXEN 500 MG PO TABS: 500.0000 mg | ORAL_TABLET | Freq: Two times a day (BID) | ORAL | 0 refills | Status: DC

## 2017-03-03 NOTE — Patient Instructions (Signed)

## 2017-03-03 NOTE — Progress Notes (Signed)
Patient: [name] Female    DOB: 28-Apr-1956   61 y.o.   MRN: [id number] Visit Date: 03/03/2017  Today's Provider: Lavon Paganini, MD   Chief Complaint  Patient presents with  . Back Pain   Subjective:    Back Pain  This is a new problem. The current episode started in the past 7 days (x 2 days). The problem is unchanged. The pain is present in the gluteal (left). The quality of the pain is described as aching ("muscle spasm"). The pain radiates to the left thigh. The pain is at a severity of 10/10. The pain is severe. The symptoms are aggravated by standing (and going from sitting/lying to standing). Associated symptoms include headaches and leg pain. Pertinent negatives include no abdominal pain, bladder incontinence, bowel incontinence, chest pain, dysuria, fever, numbness, pelvic pain, tingling or weakness. Treatments tried: OTC Extra Stength Back Pain Relief (unknown brand) and OTC pain patches. The treatment provided no relief.   States this is not similar to previous neck pain from cervical disc protrusion that she had surgery on previously.    Allergies  Allergen Reactions  . Aspirin Nausea Only  . Hydrochlorothiazide     Rash  . Lisinopril     Other reaction(s): Unknown  . Sulfa Antibiotics Hives and Nausea Only     Current Outpatient Prescriptions:  .  acetaminophen (TYLENOL) 500 MG tablet, Take 1,000 mg by mouth every 6 (six) hours as needed., Disp: , Rfl:  .  amLODipine (NORVASC) 10 MG tablet, Take 1 tablet (10 mg total) by mouth daily., Disp: 30 tablet, Rfl: 6 .  benzonatate (TESSALON) 100 MG capsule, Take 1 capsule (100 mg total) by mouth 2 (two) times daily as needed for cough., Disp: 20 capsule, Rfl: 0 .  Cholecalciferol (VITAMIN D) 2000 UNITS tablet, Take 2,000 Units by mouth daily., Disp: , Rfl:  .  dexlansoprazole (DEXILANT) 60 MG capsule, Take 1 capsule (60 mg total) by mouth daily., Disp: 90 capsule, Rfl: 1 .  diphenhydrAMINE (BENADRYL) 25 MG  tablet, Take 25 mg by mouth as needed., Disp: , Rfl:  .  ferrous sulfate 325 (65 FE) MG tablet, Take 325 mg by mouth daily with breakfast., Disp: , Rfl:  .  fluticasone (FLONASE) 50 MCG/ACT nasal spray, Place 2 sprays into both nostrils daily. (Patient taking differently: Place 2 sprays into both nostrils daily as needed. ), Disp: 16 g, Rfl: 5 .  Potassium 75 MG TABS, Take by mouth., Disp: , Rfl:  .  simvastatin (ZOCOR) 20 MG tablet, Take 1 tablet (20 mg total) by mouth every evening., Disp: 30 tablet, Rfl: 5 .  sucralfate (CARAFATE) 1 g tablet, Take 1 tablet (1 g total) by mouth 4 (four) times daily -  with meals and at bedtime., Disp: 120 tablet, Rfl: 5 .  Omega-3 Fatty Acids (FISH OIL) 1200 MG CAPS, Take 1 capsule by mouth daily., Disp: , Rfl:   Review of Systems  Constitutional: Negative for chills and fever.  Respiratory: Negative for cough, shortness of breath and wheezing.   Cardiovascular: Negative for chest pain.  Gastrointestinal: Negative for abdominal pain and bowel incontinence.  Genitourinary: Negative for bladder incontinence, dysuria and pelvic pain.  Musculoskeletal: Positive for back pain.  Skin: Negative for rash and wound.  Neurological: Positive for headaches. Negative for tingling, weakness and numbness.    Social History  Substance Use Topics  . Smoking status: Never Smoker  . Smokeless tobacco: Never Used  . Alcohol use  No   Objective:   BP 120/60 (BP Location: Left Arm, Patient Position: Sitting, Cuff Size: Normal)   Pulse 72   Temp 97.6 F (36.4 C) (Oral)   Resp 16   Wt 171 lb (77.6 kg)   BMI 28.46 kg/m  Vitals:   03/03/17 0809  BP: 120/60  Pulse: 72  Resp: 16  Temp: 97.6 F (36.4 C)  TempSrc: Oral  Weight: 171 lb (77.6 kg)     Physical Exam  Constitutional: She is oriented to person, place, and time. She appears well-developed and well-nourished. No distress.  HENT:  Head: Normocephalic and atraumatic.  Eyes: Conjunctivae are normal. No  scleral icterus.  Neck: Neck supple.  Cardiovascular: Normal rate, regular rhythm, normal heart sounds and intact distal pulses.   No murmur heard. Pulmonary/Chest: Effort normal and breath sounds normal. No respiratory distress. She has no wheezes. She has no rales.  Abdominal: Bowel sounds are normal. She exhibits no distension. There is no tenderness. There is no rebound.  Musculoskeletal: She exhibits no edema or deformity.  Back: No midline TTP over spinous processes. Mild TTP over L paraspinal musculature.  Negative SLR b/l. Sensation intact to light touch throughout LEs. LE strength 5/5 throughout.  Neurological: She is alert and oriented to person, place, and time.  Skin: Skin is warm and dry. No rash noted.  Psychiatric: She has a normal mood and affect. Her behavior is normal.  Vitals reviewed.      Assessment & Plan:          Low back pain Muscular strain of paraspinal musculature No red flags Conservative management with Naproxen BID Advised no other NSAIDs, taking with food, and ok to take Tylenol in conjunction Discussed ice/heat, staying active Start back exercises after acute phase to prevent further injury Return precautions discussed   Lavon Paganini, MD  [place]

## 2017-03-03 NOTE — Assessment & Plan Note (Signed)
Muscular strain of paraspinal musculature No red flags Conservative management with Naproxen BID Advised no other NSAIDs, taking with food, and ok to take Tylenol in conjunction Discussed ice/heat, staying active Start back exercises after acute phase to prevent further injury Return precautions discussed

## 2017-03-06 ENCOUNTER — Telehealth: Payer: Self-pay | Admitting: Physician Assistant

## 2017-03-06 MED ORDER — METHYLPREDNISOLONE 4 MG PO TBPK: ORAL_TABLET | ORAL | 0 refills | Status: DC

## 2017-03-06 NOTE — Telephone Encounter (Signed)
New Rx for medrol dose pack prescribed.  Patient may try this. It must be taken with food as it can cause upset stomach as well.  Otherwise, she can try tylenol, ice/heat.  Virginia Crews, MD, MPH [facility] 03/06/2017 4:08 PM

## 2017-03-06 NOTE — Telephone Encounter (Signed)
Pt states she was seen Friday for back pain and she rec'd a Rx.  Pt states she has stopped taking the Rx due to it made her acid reflux worse and made her stomach hurt.  Pt is asking if she can get a different Rx.  [place]  CB#[phone]/MW

## 2017-03-06 NOTE — Telephone Encounter (Signed)
Please review. Thanks!  

## 2017-03-07 NOTE — Telephone Encounter (Signed)
Pt advised.

## 2017-03-07 NOTE — Telephone Encounter (Signed)
Pt called back regarding this message and to see if anything has been called in.  Please have a nurse call.  Thanks [employer]

## 2017-03-14 ENCOUNTER — Emergency Department: Payer: Medicare HMO

## 2017-03-14 ENCOUNTER — Telehealth: Payer: Self-pay

## 2017-03-14 ENCOUNTER — Encounter: Payer: Self-pay | Admitting: Emergency Medicine

## 2017-03-14 ENCOUNTER — Observation Stay
Admission: EM | Admit: 2017-03-14 | Discharge: 2017-03-15 | Disposition: A | Discharge status: Home / Self Care | Payer: Medicare HMO | Attending: Internal Medicine | Admitting: Internal Medicine

## 2017-03-14 ENCOUNTER — Observation Stay: Payer: Medicare HMO

## 2017-03-14 DIAGNOSIS — I639 Cerebral infarction, unspecified: Principal | ICD-10-CM | POA: Insufficient documentation

## 2017-03-14 DIAGNOSIS — R51 Headache: Secondary | ICD-10-CM | POA: Insufficient documentation

## 2017-03-14 DIAGNOSIS — H53459 Other localized visual field defect, unspecified eye: Secondary | ICD-10-CM | POA: Diagnosis not present

## 2017-03-14 DIAGNOSIS — Z6829 Body mass index (BMI) 29.0-29.9, adult: Secondary | ICD-10-CM | POA: Insufficient documentation

## 2017-03-14 DIAGNOSIS — M1990 Unspecified osteoarthritis, unspecified site: Secondary | ICD-10-CM | POA: Diagnosis not present

## 2017-03-14 DIAGNOSIS — Z886 Allergy status to analgesic agent status: Secondary | ICD-10-CM | POA: Diagnosis not present

## 2017-03-14 DIAGNOSIS — E785 Hyperlipidemia, unspecified: Secondary | ICD-10-CM | POA: Diagnosis not present

## 2017-03-14 DIAGNOSIS — Z791 Long term (current) use of non-steroidal anti-inflammatories (NSAID): Secondary | ICD-10-CM | POA: Insufficient documentation

## 2017-03-14 DIAGNOSIS — K219 Gastro-esophageal reflux disease without esophagitis: Secondary | ICD-10-CM | POA: Insufficient documentation

## 2017-03-14 DIAGNOSIS — R079 Chest pain, unspecified: Secondary | ICD-10-CM | POA: Diagnosis not present

## 2017-03-14 DIAGNOSIS — Z79899 Other long term (current) drug therapy: Secondary | ICD-10-CM | POA: Diagnosis not present

## 2017-03-14 DIAGNOSIS — Z8673 Personal history of transient ischemic attack (TIA), and cerebral infarction without residual deficits: Secondary | ICD-10-CM | POA: Diagnosis present

## 2017-03-14 DIAGNOSIS — R519 Headache, unspecified: Secondary | ICD-10-CM

## 2017-03-14 DIAGNOSIS — Z7902 Long term (current) use of antithrombotics/antiplatelets: Secondary | ICD-10-CM | POA: Diagnosis not present

## 2017-03-14 DIAGNOSIS — H5340 Unspecified visual field defects: Secondary | ICD-10-CM | POA: Diagnosis not present

## 2017-03-14 DIAGNOSIS — H538 Other visual disturbances: Secondary | ICD-10-CM | POA: Diagnosis not present

## 2017-03-14 DIAGNOSIS — E7800 Pure hypercholesterolemia, unspecified: Secondary | ICD-10-CM | POA: Insufficient documentation

## 2017-03-14 DIAGNOSIS — Z882 Allergy status to sulfonamides status: Secondary | ICD-10-CM | POA: Insufficient documentation

## 2017-03-14 DIAGNOSIS — I10 Essential (primary) hypertension: Secondary | ICD-10-CM | POA: Insufficient documentation

## 2017-03-14 LAB — HEMOGLOBIN A1C
HEMOGLOBIN A1C: 6 % — AB (ref 4.8–5.6)
MEAN PLASMA GLUCOSE: 125.5 mg/dL

## 2017-03-14 LAB — CBC
HCT: 40.7 % (ref 35.0–47.0)
Hemoglobin: 13.9 g/dL (ref 12.0–16.0)
MCH: 27.8 pg (ref 26.0–34.0)
MCHC: 34.1 g/dL (ref 32.0–36.0)
MCV: 81.5 fL (ref 80.0–100.0)
PLATELETS: 383 10*3/uL (ref 150–440)
RBC: 4.99 MIL/uL (ref 3.80–5.20)
RDW: 13.8 % (ref 11.5–14.5)
WBC: 10.7 10*3/uL (ref 3.6–11.0)

## 2017-03-14 LAB — BASIC METABOLIC PANEL
Anion gap: 8 (ref 5–15)
BUN: 10 mg/dL (ref 6–20)
CHLORIDE: 104 mmol/L (ref 101–111)
CO2: 28 mmol/L (ref 22–32)
CREATININE: 0.66 mg/dL (ref 0.44–1.00)
Calcium: 9.1 mg/dL (ref 8.9–10.3)
GFR calc Af Amer: >60 mL/min (ref >60)
GFR calc non Af Amer: >60 mL/min (ref >60)
Glucose, Bld: 92 mg/dL (ref 65–99)
Potassium: 3.1 mmol/L — ABNORMAL LOW (ref 3.5–5.1)
SODIUM: 140 mmol/L (ref 135–145)

## 2017-03-14 LAB — TROPONIN I
Troponin I: <0.03 ng/mL (ref <0.03)
Troponin I: <0.03 ng/mL (ref <0.03)

## 2017-03-14 LAB — FIBRIN DERIVATIVES D-DIMER (ARMC ONLY): FIBRIN DERIVATIVES D-DIMER (ARMC): 337.34 (ref 0.00–499.00)

## 2017-03-14 MED ORDER — SUCRALFATE 1 G PO TABS
1.0000 g | ORAL_TABLET | Freq: Three times a day (TID) | ORAL | Status: DC
  Administered 2017-03-15 (×2): 1 g via ORAL
  Filled 2017-03-14 (×4): qty 1

## 2017-03-14 MED ORDER — BENZONATATE 100 MG PO CAPS: 100.0000 mg | ORAL_CAPSULE | Freq: Two times a day (BID) | ORAL | Status: DC | PRN

## 2017-03-14 MED ORDER — SIMVASTATIN 20 MG PO TABS
20.0000 mg | ORAL_TABLET | Freq: Every evening | ORAL | Status: DC
  Administered 2017-03-14: 23:00:00 20 mg via ORAL
  Filled 2017-03-14: qty 1

## 2017-03-14 MED ORDER — ONDANSETRON HCL 4 MG PO TABS: 4.0000 mg | ORAL_TABLET | Freq: Four times a day (QID) | ORAL | Status: DC | PRN

## 2017-03-14 MED ORDER — ATORVASTATIN CALCIUM 20 MG PO TABS: 40.0000 mg | ORAL_TABLET | Freq: Every day | ORAL | Status: DC

## 2017-03-14 MED ORDER — OMEGA-3-ACID ETHYL ESTERS 1 G PO CAPS
1.0000 g | ORAL_CAPSULE | Freq: Two times a day (BID) | ORAL | Status: DC
  Administered 2017-03-14 – 2017-03-15 (×2): 1 g via ORAL
  Filled 2017-03-14 (×2): qty 1

## 2017-03-14 MED ORDER — ACETAMINOPHEN 650 MG RE SUPP: 650.0000 mg | Freq: Four times a day (QID) | RECTAL | Status: DC | PRN

## 2017-03-14 MED ORDER — PANTOPRAZOLE SODIUM 40 MG PO TBEC
40.0000 mg | DELAYED_RELEASE_TABLET | Freq: Every day | ORAL | Status: DC
  Administered 2017-03-15: 40 mg via ORAL
  Filled 2017-03-14: qty 1

## 2017-03-14 MED ORDER — ENOXAPARIN SODIUM 40 MG/0.4ML ~~LOC~~ SOLN
SUBCUTANEOUS | Status: AC
  Administered 2017-03-14: 40 mg via SUBCUTANEOUS
  Filled 2017-03-14: qty 0.4

## 2017-03-14 MED ORDER — ACETAMINOPHEN 325 MG PO TABS
ORAL_TABLET | ORAL | Status: AC
  Administered 2017-03-14: 650 mg via ORAL
  Filled 2017-03-14: qty 2

## 2017-03-14 MED ORDER — AMLODIPINE BESYLATE 10 MG PO TABS
10.0000 mg | ORAL_TABLET | Freq: Every day | ORAL | Status: DC
  Administered 2017-03-15: 10 mg via ORAL
  Filled 2017-03-14: qty 1

## 2017-03-14 MED ORDER — ASPIRIN EC 81 MG PO TBEC: 81.0000 mg | DELAYED_RELEASE_TABLET | Freq: Every day | ORAL | Status: DC

## 2017-03-14 MED ORDER — ONDANSETRON HCL 4 MG/2ML IJ SOLN: 4.0000 mg | Freq: Four times a day (QID) | INTRAMUSCULAR | Status: DC | PRN

## 2017-03-14 MED ORDER — ACETAMINOPHEN 325 MG PO TABS
650.0000 mg | ORAL_TABLET | Freq: Four times a day (QID) | ORAL | Status: DC | PRN
  Administered 2017-03-14: 650 mg via ORAL

## 2017-03-14 MED ORDER — POLYETHYLENE GLYCOL 3350 17 G PO PACK: 17.0000 g | PACK | Freq: Every day | ORAL | Status: DC | PRN

## 2017-03-14 MED ORDER — ENOXAPARIN SODIUM 40 MG/0.4ML ~~LOC~~ SOLN
40.0000 mg | SUBCUTANEOUS | Status: DC
  Administered 2017-03-14: 40 mg via SUBCUTANEOUS

## 2017-03-14 MED ORDER — ALBUTEROL SULFATE (2.5 MG/3ML) 0.083% IN NEBU: 2.5000 mg | INHALATION_SOLUTION | RESPIRATORY_TRACT | Status: DC | PRN

## 2017-03-14 MED ORDER — SODIUM CHLORIDE 0.9% FLUSH
3.0000 mL | Freq: Two times a day (BID) | INTRAVENOUS | Status: DC
  Administered 2017-03-14 – 2017-03-15 (×2): 3 mL via INTRAVENOUS

## 2017-03-14 MED ORDER — ASPIRIN 81 MG PO CHEW
81.0000 mg | CHEWABLE_TABLET | Freq: Once | ORAL | Status: DC
  Filled 2017-03-14: qty 1

## 2017-03-14 NOTE — ED Notes (Signed)
Dr. Darvin Neighbours at bedside.

## 2017-03-14 NOTE — ED Notes (Signed)
Pt taken to MRI by this RN.  

## 2017-03-14 NOTE — ED Triage Notes (Signed)
Pt to ed with c/o blurred vision, headache, and chest pain that started on Sunday PM. Pt reports has hx of similar episodes in the past.  Reports Sunday, headache and blurred vision were the worst.

## 2017-03-14 NOTE — ED Notes (Signed)
Pt states posterior HA with L eye blurred vision that comes and goes x few days. States central CP, non radiating. Some nausea but no vomiting. States last time she had CP it was URI. Pt is alert, oriented, speaking in complete sentences.

## 2017-03-14 NOTE — ED Notes (Signed)
Pt L side peripheral vision is less than R sided peripheral vision.

## 2017-03-14 NOTE — H&P (Signed)
[place] [facility] [place] NAME: [name]    MR#:  [id number]  DATE OF BIRTH:  [birth year]  DATE OF ADMISSION:  03/14/2017  PRIMARY CARE PHYSICIAN: Mar Daring, PA-C   REQUESTING/REFERRING PHYSICIAN: Dr. Cinda Quest  CHIEF COMPLAINT:   Chief Complaint  Patient presents with  . Chest Pain  . Headache  . Blurred Vision    HISTORY OF PRESENT ILLNESS:  [name]  is a 61 y.o. female with a known history of Hypertension, GERD, low back pain, arthritis presents to the emergency room complaining of 2 days of left-sided blurred vision. She has also had some burning from GERD. Called her primary care physician and was sent to the emergency room. Here patient had a CT scan of the head which was negative. Was evaluated by tele neurology and suggested observation with MRI, echocardiogram and carotid Dopplers for CVA. Patient has no focal weakness or numbness. No swallowing problems. Never had similar symptoms in the past. Complains of vague right-sided headache which is improved today.  PAST MEDICAL HISTORY:   Past Medical History:  Diagnosis Date  . Allergy   . Anemia    takes Ferrous Sulfate daily  . Arthritis   . GERD (gastroesophageal reflux disease)    takes OTC med  . Hyperlipidemia   . Hypertension    takes Amlodipine daily  . Low back pain    occasionally    PAST SURGICAL HISTORY:   Past Surgical History:  Procedure Laterality Date  . ANTERIOR CERVICAL DECOMP/DISCECTOMY FUSION  10/19/2011   Procedure: ANTERIOR CERVICAL DECOMPRESSION/DISCECTOMY FUSION 1 LEVEL/HARDWARE REMOVAL;  Surgeon: Elaina Hoops, MD;  Location: [place] [facility];  Service: Neurosurgery;  Laterality: N/A;  Cervical four - five  Anterior cervical decompression fusion with redo at six - seven  Rm 33  . COLONOSCOPY  08/2014   polyps  . ESOPHAGOGASTRODUODENOSCOPY (EGD) WITH PROPOFOL N/A 06/23/2015   Procedure: ESOPHAGOGASTRODUODENOSCOPY (EGD) WITH PROPOFOL;   Surgeon: [name], MD;  Location: [facility];  Service: Endoscopy;  Laterality: N/A;  . NECK SURGERY    . POSTERIOR CERVICAL FUSION/FORAMINOTOMY N/A 08/26/2013   Procedure: C/4-5,C/5-6,C/6-7 Posterior Cervical Fusion w/lateral mass fixation;  Surgeon: Elaina Hoops, MD;  Location: [place] [facility];  Service: Neurosurgery;  Laterality: N/A;  . TUBAL LIGATION      SOCIAL HISTORY:   Social History  Substance Use Topics  . Smoking status: Never Smoker  . Smokeless tobacco: Never Used  . Alcohol use No    FAMILY HISTORY:   Family History  Problem Relation Age of Onset  . Hypertension Mother   . Heart disease Mother   . Breast cancer Neg Hx     DRUG ALLERGIES:   Allergies  Allergen Reactions  . Aspirin Nausea Only  . Hydrochlorothiazide     Rash  . Lisinopril     Other reaction(s): Unknown  . Sulfa Antibiotics Hives and Nausea Only    REVIEW OF SYSTEMS:   Review of Systems  Constitutional: Positive for malaise/fatigue. Negative for chills and fever.  HENT: Negative for sore throat.   Eyes: Positive for blurred vision. Negative for double vision and pain.  Respiratory: Negative for cough, hemoptysis, shortness of breath and wheezing.   Cardiovascular: Negative for chest pain, palpitations, orthopnea and leg swelling.  Gastrointestinal: Negative for abdominal pain, constipation, diarrhea, heartburn, nausea and vomiting.  Genitourinary: Negative for dysuria and hematuria.  Musculoskeletal: Negative for back pain and joint pain.  Skin: Negative for  rash.  Neurological: Negative for sensory change, speech change, focal weakness and headaches.  Endo/Heme/Allergies: Does not bruise/bleed easily.  Psychiatric/Behavioral: Negative for depression. The patient is not nervous/anxious.     MEDICATIONS AT HOME:   Prior to Admission medications   Medication Sig Start Date End Date Taking? Authorizing Provider  acetaminophen (TYLENOL) 500 MG tablet Take 1,000 mg by mouth every 6  (six) hours as needed.   Yes [provider]  amLODipine (NORVASC) 10 MG tablet Take 1 tablet (10 mg total) by mouth daily. 10/25/16  Yes Mar Daring, PA-C  benzonatate (TESSALON) 100 MG capsule Take 1 capsule (100 mg total) by mouth 2 (two) times daily as needed for cough. 12/09/16  Yes Mar Daring, PA-C  Cholecalciferol (VITAMIN D) 2000 UNITS tablet Take 2,000 Units by mouth daily.   Yes [provider]  dexlansoprazole (DEXILANT) 60 MG capsule Take 1 capsule (60 mg total) by mouth daily. 12/07/16  Yes Mar Daring, PA-C  diphenhydrAMINE (BENADRYL) 25 MG tablet Take 25 mg by mouth as needed.   Yes [provider]  ferrous sulfate 325 (65 FE) MG tablet Take 325 mg by mouth daily with breakfast.   Yes [provider]  fluticasone (FLONASE) 50 MCG/ACT nasal spray Place 2 sprays into both nostrils daily. Patient taking differently: Place 2 sprays into both nostrils daily as needed.  10/20/16  Yes Chauvin, Herbie [place], PA  Omega-3 Fatty Acids (FISH OIL) 1200 MG CAPS Take 1 capsule by mouth daily.   Yes [provider]  Potassium 75 MG TABS Take by mouth.   Yes [provider]  simvastatin (ZOCOR) 20 MG tablet Take 1 tablet (20 mg total) by mouth every evening. 09/19/16  Yes Mar Daring, PA-C  sucralfate (CARAFATE) 1 g tablet Take 1 tablet (1 g total) by mouth 4 (four) times daily -  with meals and at bedtime. 12/07/16  Yes Mar Daring, PA-C  methylPREDNISolone (MEDROL DOSEPAK) 4 MG TBPK tablet Take as directed per dose pack. Patient not taking: Reported on 03/14/2017 03/06/17   Virginia Crews, MD     VITAL SIGNS:  Blood pressure (!) 148/81, pulse (!) 57, temperature 98 F (36.7 C), resp. rate 16, weight 77.6 kg (171 lb), SpO2 98 %.  PHYSICAL EXAMINATION:  Physical Exam  GENERAL:  61 y.o.-year-old patient lying in the bed with no acute distress.  EYES: Pupils equal, round, reactive to light and  accommodation. No scleral icterus. Extraocular muscles intact.  HEENT: Head atraumatic, normocephalic. Oropharynx and nasopharynx clear. No oropharyngeal erythema, moist oral mucosa  NECK:  Supple, no jugular venous distention. No thyroid enlargement, no tenderness.  LUNGS: Normal breath sounds bilaterally, no wheezing, rales, rhonchi. No use of accessory muscles of respiration.  CARDIOVASCULAR: S1, S2 normal. No murmurs, rubs, or gallops.  ABDOMEN: Soft, nontender, nondistended. Bowel sounds present. No organomegaly or mass.  EXTREMITIES: No pedal edema, cyanosis, or clubbing. + 2 pedal & radial pulses b/l.   NEUROLOGIC: Cranial nerves II through XII are intact. No focal Motor or sensory deficits appreciated b/l PSYCHIATRIC: The patient is alert and oriented x 3. Good affect.  SKIN: No obvious rash, lesion, or ulcer.   LABORATORY PANEL:   CBC  Recent Labs Lab 03/14/17 1343  WBC 10.7  HGB 13.9  HCT 40.7  PLT 383   ------------------------------------------------------------------------------------------------------------------  Chemistries   Recent Labs Lab 03/14/17 1343  NA 140  K 3.1*  CL 104  CO2 28  GLUCOSE 92  BUN  10  CREATININE 0.66  CALCIUM 9.1   ------------------------------------------------------------------------------------------------------------------  Cardiac Enzymes  Recent Labs Lab 03/14/17 1724  TROPONINI <0.03   ------------------------------------------------------------------------------------------------------------------  RADIOLOGY:  Dg Chest 2 View  Result Date: 03/14/2017 CLINICAL DATA:  Onset of blurred vision, headache, and chest pain two days ago. Similar but less severe symptoms in the past. History of hypertension and hyperlipidemia. EXAM: CHEST  2 VIEW COMPARISON:  Chest x-ray of September 21, 2016. FINDINGS: The lungs are reasonably well inflated. The interstitial markings are coarse. The cardiac silhouette is normal. The pulmonary  vascularity is not engorged. The mediastinum is normal in width. There is calcification in the wall of the aortic arch. The bony thorax and exhibits no acute abnormality. IMPRESSION: Mild interstitial prominence more conspicuous than on the previous study may reflect acute bronchitic change. There is no CHF nor alveolar pneumonia. Electronically Signed   By: David  Martinique M.D.   On: 03/14/2017 14:00   Ct Head Wo Contrast  Result Date: 03/14/2017 CLINICAL DATA:  Headache with blurred vision EXAM: CT HEAD WITHOUT CONTRAST TECHNIQUE: Contiguous axial images were obtained from the base of the skull through the vertex without intravenous contrast. COMPARISON:  None. FINDINGS: Brain: The ventricles are normal in size and configuration. There is, however, mild to moderate cerebellar atrophy. There is no intracranial mass, hemorrhage, extra-axial fluid collection, or midline shift. Gray-white compartments appear normal. No acute infarct evident. Vascular: No hyperdense vessel. There are foci of calcification in each carotid siphon region. Skull: Foci of cortical thinning in the superior parietal regions is felt to be physiologic. No lytic or destructive lesions are identified. No sclerotic lesions evident. Sinuses/Orbits: There is opacification in the anterior, lateral right sphenoid sinus. There is mild debris in the left sphenoid sinus. There is mucosal thickening in multiple ethmoid air cells. There is mucosal thickening in the posterior ethmoid air cell region on the right. Other visualized paranasal sinuses appear unremarkable. Visualized orbits appear symmetric bilaterally. Other: Visualized mastoid air cells are clear. IMPRESSION: 1. Mild to moderate cerebellar atrophy. Ventricles normal in size and configuration. 2. No intracranial mass, hemorrhage, or extra-axial fluid collection. Gray-white compartments appear normal. 3.  There are foci of arterial vascular calcification. 4.  There are several foci of  paranasal sinus disease. Electronically Signed   By: Lowella Grip III M.D.   On: 03/14/2017 18:20     IMPRESSION AND PLAN:   * Acute left eye Blurred vision likely due to CVA. Vision has been evaluated by tele neurology. Advised observation. We will check MRI of the brain. Echocardiogram and carotid Dopplers. Start aspirin and statin. Check morning lipid profile. Lovenox for DVT prophylaxis. Consult neurology. Patient does not have any focal weakness or speech problems or swallow problems. Will not need PT or speech evaluation.  * Hypertension. Continue home medications.  * GERD with chronic burning in the chest. Order  PPIs.  All the records are reviewed and case discussed with ED provider. Management plans discussed with the patient, family and they are in agreement.  CODE STATUS: FULL CODE  TOTAL TIME TAKING CARE OF THIS PATIENT: 40 minutes.   Hillary Bow R M.D on 03/14/2017 at 7:41 PM  Between 7am to 6pm - Pager - [phone]  After 6pm go to [email or web address] - password [email or web address]  Office  [phone]  CC: Primary care physician; Mar Daring, PA-C  Note: This dictation was prepared with Dragon dictation along with smaller phrase technology. Any transcriptional errors that result  from this process are unintentional.

## 2017-03-14 NOTE — ED Provider Notes (Signed)
[facility] Emergency Department Provider Note   ____________________________________________   First MD Initiated Contact with Patient 03/14/17 1623     (approximate)  I have reviewed the triage vital signs and the nursing notes.   HISTORY  Chief Complaint Chest Pain; Headache; and Blurred Vision   HPI [name] is a 61 y.o. female patient comes in complaining of a headache and blurry vision in the left eye. It was there most the day yesterday and today before but today it's much better. It's present if she closes one eye or the other. Chest has pain in the middle of her chest that goes to the back when she takes a deep breath. She is not coughing she's not running a fever chest pain is not made worse with exertion although sometimes if she moves it gets worse. Patient told the nurse she has had this before but not as severe and I understood her to tell me that she's never had this before. At least not the blurry vision.   Past Medical History:  Diagnosis Date  . Allergy   . Anemia    takes Ferrous Sulfate daily  . Arthritis   . GERD (gastroesophageal reflux disease)    takes OTC med  . Hyperlipidemia   . Hypertension    takes Amlodipine daily  . Low back pain    occasionally    Patient Active Problem List   Diagnosis Date Noted  . Gastritis   . BPPV (benign paroxysmal positional vertigo) 03/24/2015  . Right hip pain 01/07/2015  . Absolute anemia 01/06/2015  . BMI 29.0-29.9,adult 01/06/2015  . Cervical disc disease 01/06/2015  . Atypical chest pain 01/06/2015  . Generalized pruritus 01/06/2015  . Acid reflux 01/06/2015  . Cervicogenic headache 01/06/2015  . Hypercholesteremia 01/06/2015  . High potassium 01/06/2015  . Low back pain 01/06/2015  . Pseudoarthrosis of cervical spine ([place]) 08/26/2013  . Accumulation of fluid in tissues 12/24/2009  . Avitaminosis D 10/15/2009  . Cardiac conduction disorder 09/30/2008  . Essential  (primary) hypertension 09/30/2008  . Blood in the urine 09/30/2008    Past Surgical History:  Procedure Laterality Date  . ANTERIOR CERVICAL DECOMP/DISCECTOMY FUSION  10/19/2011   Procedure: ANTERIOR CERVICAL DECOMPRESSION/DISCECTOMY FUSION 1 LEVEL/HARDWARE REMOVAL;  Surgeon: Elaina Hoops, MD;  Location: [place];  Service: Neurosurgery;  Laterality: N/A;  Cervical four - five  Anterior cervical decompression fusion with redo at six - seven  Rm 33  . COLONOSCOPY  08/2014   polyps  . ESOPHAGOGASTRODUODENOSCOPY (EGD) WITH PROPOFOL N/A 06/23/2015   Procedure: ESOPHAGOGASTRODUODENOSCOPY (EGD) WITH PROPOFOL;  Surgeon: [name], MD;  Location: [facility] ENDOSCOPY;  Service: Endoscopy;  Laterality: N/A;  . NECK SURGERY    . POSTERIOR CERVICAL FUSION/FORAMINOTOMY N/A 08/26/2013   Procedure: C/4-5,C/5-6,C/6-7 Posterior Cervical Fusion w/lateral mass fixation;  Surgeon: Elaina Hoops, MD;  Location: [place] [facility];  Service: Neurosurgery;  Laterality: N/A;  . TUBAL LIGATION      Prior to Admission medications   Medication Sig Start Date End Date Taking? Authorizing Provider  acetaminophen (TYLENOL) 500 MG tablet Take 1,000 mg by mouth every 6 (six) hours as needed.   Yes [provider]  amLODipine (NORVASC) 10 MG tablet Take 1 tablet (10 mg total) by mouth daily. 10/25/16  Yes Mar Daring, PA-C  benzonatate (TESSALON) 100 MG capsule Take 1 capsule (100 mg total) by mouth 2 (two) times daily as needed for cough. 12/09/16  Yes Mar Daring,  PA-C  Cholecalciferol (VITAMIN D) 2000 UNITS tablet Take 2,000 Units by mouth daily.   Yes [provider]  dexlansoprazole (DEXILANT) 60 MG capsule Take 1 capsule (60 mg total) by mouth daily. 12/07/16  Yes Mar Daring, PA-C  diphenhydrAMINE (BENADRYL) 25 MG tablet Take 25 mg by mouth as needed.   Yes [provider]  ferrous sulfate 325 (65 FE) MG tablet Take 325 mg by mouth daily with breakfast.   Yes [provider]  fluticasone (FLONASE) 50 MCG/ACT nasal spray Place 2 sprays into both nostrils daily. Patient taking differently: Place 2 sprays into both nostrils daily as needed.  10/20/16  Yes Chauvin, Herbie [place], PA  Omega-3 Fatty Acids (FISH OIL) 1200 MG CAPS Take 1 capsule by mouth daily.   Yes [provider]  Potassium 75 MG TABS Take by mouth.   Yes [provider]  simvastatin (ZOCOR) 20 MG tablet Take 1 tablet (20 mg total) by mouth every evening. 09/19/16  Yes Mar Daring, PA-C  sucralfate (CARAFATE) 1 g tablet Take 1 tablet (1 g total) by mouth 4 (four) times daily -  with meals and at bedtime. 12/07/16  Yes Mar Daring, PA-C  methylPREDNISolone (MEDROL DOSEPAK) 4 MG TBPK tablet Take as directed per dose pack. Patient not taking: Reported on 03/14/2017 03/06/17   Virginia Crews, MD    Allergies Aspirin; Hydrochlorothiazide; Lisinopril; and Sulfa antibiotics  Family History  Problem Relation Age of Onset  . Hypertension Mother   . Heart disease Mother   . Breast cancer Neg Hx     Social History Social History  Substance Use Topics  . Smoking status: Never Smoker  . Smokeless tobacco: Never Used  . Alcohol use No    Review of Systems  Constitutional: No fever/chills Eyes: No visual changes. ENT: No sore throat. Cardiovascular: chest pain. Respiratory: Denies shortness of breath. Gastrointestinal: No abdominal pain.  No nausea, no vomiting.  No diarrhea.  No constipation. Genitourinary: Negative for dysuria. Musculoskeletal: Negative for back pain. Skin: Negative for rash. Neurological: Negative for headaches, focal weakness  ____________________________________________   PHYSICAL EXAM:  VITAL SIGNS: ED Triage Vitals [03/14/17 1339]  Enc Vitals Group     BP 126/60     Pulse Rate 68     Resp 20     Temp 97.8 F (36.6 C)     Temp Source Oral     SpO2 99 %     Weight 171 lb (77.6 kg)     Height      Head Circumference        Peak Flow      Pain Score 8     Pain Loc      Pain Edu?      Excl. in [place]?    Constitutional: Alert and oriented. Well appearing and in no acute distress. Eyes: Conjunctivae are normal. PERRL. EOMI.Fundi are somewhat difficult to see but appear normal Head: Atraumatic. Nose: No congestion/rhinnorhea. Mouth/Throat: Mucous membranes are moist.  Oropharynx non-erythematous. Neck: No stridor.  Cardiovascular: Normal rate, regular rhythm. Grossly normal heart sounds.  Good peripheral circulation. Respiratory: Normal respiratory effort.  No retractions. Lungs CTAB. Gastrointestinal: Soft and nontender. No distention. No abdominal bruits. No CVA tenderness. }Musculoskeletal: No lower extremity tenderness nor edema.  No joint effusions. Neurologic:  Normal speech and language. No gross focal neurologic deficits are appreciated. Cranial nerves II through XII are intact cerebellar finger-nose rapid alternating movements in the hands are normal motor strength  is 5 over 5 throughout there is no palmar drift Skin:  Skin is warm, dry and intact. No rash noted. Psychiatric: Mood and affect are normal. Speech and behavior are normal.  ____________________________________________   LABS (all labs ordered are listed, but only abnormal results are displayed)  Labs Reviewed  BASIC METABOLIC PANEL - Abnormal; Notable for the following:       Result Value   Potassium 3.1 (*)    All other components within normal limits  CBC  TROPONIN I  TROPONIN I  FIBRIN DERIVATIVES D-DIMER ([facility] ONLY)   ____________________________________________  EKG  EKG read and interpreted by me shows normal sinus rhythm rate of 70 normal axis basically normal EKG ____________________________________________  RADIOLOGY  Dg Chest 2 View  Result Date: 03/14/2017 CLINICAL DATA:  Onset of blurred vision, headache, and chest pain two days ago. Similar but less severe symptoms in the past. History of hypertension and  hyperlipidemia. EXAM: CHEST  2 VIEW COMPARISON:  Chest x-ray of September 21, 2016. FINDINGS: The lungs are reasonably well inflated. The interstitial markings are coarse. The cardiac silhouette is normal. The pulmonary vascularity is not engorged. The mediastinum is normal in width. There is calcification in the wall of the aortic arch. The bony thorax and exhibits no acute abnormality. IMPRESSION: Mild interstitial prominence more conspicuous than on the previous study may reflect acute bronchitic change. There is no CHF nor alveolar pneumonia. Electronically Signed   By: David  Martinique M.D.   On: 03/14/2017 14:00   Ct Head Wo Contrast  Result Date: 03/14/2017 CLINICAL DATA:  Headache with blurred vision EXAM: CT HEAD WITHOUT CONTRAST TECHNIQUE: Contiguous axial images were obtained from the base of the skull through the vertex without intravenous contrast. COMPARISON:  None. FINDINGS: Brain: The ventricles are normal in size and configuration. There is, however, mild to moderate cerebellar atrophy. There is no intracranial mass, hemorrhage, extra-axial fluid collection, or midline shift. Gray-white compartments appear normal. No acute infarct evident. Vascular: No hyperdense vessel. There are foci of calcification in each carotid siphon region. Skull: Foci of cortical thinning in the superior parietal regions is felt to be physiologic. No lytic or destructive lesions are identified. No sclerotic lesions evident. Sinuses/Orbits: There is opacification in the anterior, lateral right sphenoid sinus. There is mild debris in the left sphenoid sinus. There is mucosal thickening in multiple ethmoid air cells. There is mucosal thickening in the posterior ethmoid air cell region on the right. Other visualized paranasal sinuses appear unremarkable. Visualized orbits appear symmetric bilaterally. Other: Visualized mastoid air cells are clear. IMPRESSION: 1. Mild to moderate cerebellar atrophy. Ventricles normal in size  and configuration. 2. No intracranial mass, hemorrhage, or extra-axial fluid collection. Gray-white compartments appear normal. 3.  There are foci of arterial vascular calcification. 4.  There are several foci of paranasal sinus disease. Electronically Signed   By: Lowella Grip III M.D.   On: 03/14/2017 18:20    ____________________________________________   PROCEDURES  Procedure(s) performed:   Procedures  Critical Care performed:  ____________________________________________   INITIAL IMPRESSION / ASSESSMENT AND PLAN / ED COURSE  Pertinent labs & imaging results that were available during my care of the patient were reviewed by me and considered in my medical decision making (see chart for details).     tell a neurology reports she has a visual field cut which I was suspicious of a couldn't really find a feel it could be due to a stroke suggest MRI and observe.   ____________________________________________  FINAL CLINICAL IMPRESSION(S) / ED DIAGNOSES  Final diagnoses:  Nonintractable episodic headache, unspecified headache type  Visual field cut      NEW MEDICATIONS STARTED DURING THIS VISIT:  New Prescriptions   No medications on file     Note:  This document was prepared using Dragon voice recognition software and may include unintentional dictation errors.    Nena Polio, MD 03/14/17 [phone]

## 2017-03-14 NOTE — Telephone Encounter (Signed)
Patient called stating that she has had chest pain, tightness in her chest, headache, blurred vision and nausea for the past 2 days. Patient has been taking OTC pain relievers which has helped to slightly improve headache and blurred vision. Patient states that her blood pressure has been normal (she doesn't recall the actual numbers). I advised patient that she needs to go to the ER for evaluation to make sure she has not having a stroke or heart attack. Patient verbally voiced understanding and agrees to go to there ER. I consulted with Dr. Brita Romp who agrees that patient should go to the ER.

## 2017-03-14 NOTE — ED Notes (Signed)
SOC in progress.  

## 2017-03-14 NOTE — ED Notes (Signed)
Pt in CT.

## 2017-03-14 NOTE — ED Notes (Signed)
Pt screened by MRI tech on the phone

## 2017-03-15 ENCOUNTER — Telehealth: Payer: Self-pay | Admitting: Physician Assistant

## 2017-03-15 ENCOUNTER — Observation Stay (HOSPITAL_BASED_OUTPATIENT_CLINIC_OR_DEPARTMENT_OTHER)
Admit: 2017-03-15 | Discharge: 2017-03-15 | Disposition: A | Discharge status: Home / Self Care | Payer: Medicare HMO | Attending: Internal Medicine | Admitting: Internal Medicine

## 2017-03-15 ENCOUNTER — Observation Stay: Payer: Medicare HMO

## 2017-03-15 DIAGNOSIS — H538 Other visual disturbances: Secondary | ICD-10-CM | POA: Diagnosis not present

## 2017-03-15 DIAGNOSIS — I10 Essential (primary) hypertension: Secondary | ICD-10-CM | POA: Diagnosis not present

## 2017-03-15 DIAGNOSIS — H5340 Unspecified visual field defects: Secondary | ICD-10-CM | POA: Diagnosis not present

## 2017-03-15 DIAGNOSIS — I639 Cerebral infarction, unspecified: Secondary | ICD-10-CM

## 2017-03-15 DIAGNOSIS — R51 Headache: Secondary | ICD-10-CM | POA: Diagnosis not present

## 2017-03-15 DIAGNOSIS — R079 Chest pain, unspecified: Secondary | ICD-10-CM | POA: Diagnosis not present

## 2017-03-15 LAB — LIPID PANEL
CHOL/HDL RATIO: 4 ratio
CHOLESTEROL: 165 mg/dL (ref 0–200)
HDL: 41 mg/dL (ref >40)
LDL Cholesterol: 93 mg/dL (ref 0–99)
TRIGLYCERIDES: 156 mg/dL — AB (ref <150)
VLDL: 31 mg/dL (ref 0–40)

## 2017-03-15 LAB — ECHOCARDIOGRAM COMPLETE
Height: 65 in
Weight: 2764.8 oz

## 2017-03-15 MED ORDER — CLOPIDOGREL BISULFATE 75 MG PO TABS
75.0000 mg | ORAL_TABLET | Freq: Every day | ORAL | Status: DC
  Administered 2017-03-15: 75 mg via ORAL
  Filled 2017-03-15: qty 1

## 2017-03-15 MED ORDER — CLOPIDOGREL BISULFATE 75 MG PO TABS: 75.0000 mg | ORAL_TABLET | Freq: Every day | ORAL | 0 refills | Status: DC

## 2017-03-15 MED ORDER — ATORVASTATIN CALCIUM 40 MG PO TABS: 40.0000 mg | ORAL_TABLET | Freq: Every day | ORAL | 0 refills | Status: DC

## 2017-03-15 MED ORDER — POTASSIUM CHLORIDE 20 MEQ PO PACK
40.0000 meq | PACK | Freq: Once | ORAL | Status: AC
  Administered 2017-03-15: 40 meq via ORAL
  Filled 2017-03-15: qty 2

## 2017-03-15 NOTE — Care Management Obs Status (Signed)
[place] NOTIFICATION   Patient Details  Name: [name] MRN: [id number] Date of Birth: [birth year]   Medicare Observation Status Notification Given:  Yes    Shelbie Ammons, RN 03/15/2017, 9:25 AM

## 2017-03-15 NOTE — Telephone Encounter (Signed)
Ok.  Can we do transition telephone follow up please. Thanks, JB

## 2017-03-15 NOTE — Progress Notes (Signed)
Pt is being discharged home. Discharge papers given and explained to pt, verbalized understating. Meds and f/u appointment reviewed with pt. RX to be picked up from pharmacy. Pt is aware.

## 2017-03-15 NOTE — Progress Notes (Signed)
*  PRELIMINARY RESULTS* Echocardiogram 2D Echocardiogram has been performed.  [name] 03/15/2017, 12:06 PM

## 2017-03-15 NOTE — Progress Notes (Signed)
Pnt awakened for Neuro assessment. No changes noted. Pnt awake, alert and oriented x4. Pnt denies any pain or discomfort at this time. Bed low, locked and call bell in reach. Will continue to monitor and assess.

## 2017-03-15 NOTE — Care Management Note (Signed)
Case Management Note  Patient Details  Name: [name] MRN: [id number] Date of Birth: 1956-02-12  Subjective/Objective: Admitted to [facility] under observation status with the diagnosis CVA. Son lives in the home. Daughter is Lorenso Courier [phone]). Last seen [facility] 01/18/17. Prescriptions are filled at [facility]. Takes care of all basic activities of daily living herself. No home Health. No skilled facility. No medical equipment in the home. Stroke work -up in progress.    MR-                Action/Plan: Physical therapy evaluation pending.    Expected Discharge Date:                  Expected Discharge Plan:     In-House Referral:     Discharge planning Services     Post Acute Care Choice:    Choice offered to:     DME Arranged:    DME Agency:     HH Arranged:    HH Agency:     Status of Service:     If discussed at [employer] [facility] [employer], dates discussed:    Additional Comments:  Shelbie Ammons, [place] Management [phone] 03/15/2017, 9:16 AM

## 2017-03-15 NOTE — Progress Notes (Signed)
No changes overnight, pnt denies pain or discomfort. Neuro checks remain unchanged overnight. Will continue to monitor and assess.

## 2017-03-15 NOTE — Telephone Encounter (Signed)
Pt is being discharged from [facility] [address] today for a CVA/stroke.  I have scheduled a hospital follow up/MW

## 2017-03-15 NOTE — Discharge Instructions (Signed)
Heart healthy diet. °Activity as tolerated. °

## 2017-03-15 NOTE — Consult Note (Signed)
Reason for Consult: L eye blurry vision  Referring Physician: Dr. Darvin Neighbours   CC: L eye blurry visoin   HPI: [name] is an 61 y.o. female with a known history of Hypertension, GERD, low back pain, arthritis presents to the emergency room complaining of 2 days of left-sided blurred vision. Started 2 days ago in AM and now has resolved. Only states L eye involved. Currently back to baseline.  No residual deficits. No anti platelet use at home.    Past Medical History:  Diagnosis Date  . Allergy   . Anemia    takes Ferrous Sulfate daily  . Arthritis   . GERD (gastroesophageal reflux disease)    takes OTC med  . Hyperlipidemia   . Hypertension    takes Amlodipine daily  . Low back pain    occasionally    Past Surgical History:  Procedure Laterality Date  . ANTERIOR CERVICAL DECOMP/DISCECTOMY FUSION  10/19/2011   Procedure: ANTERIOR CERVICAL DECOMPRESSION/DISCECTOMY FUSION 1 LEVEL/HARDWARE REMOVAL;  Surgeon: Elaina Hoops, MD;  Location: [facility];  Service: Neurosurgery;  Laterality: N/A;  Cervical four - five  Anterior cervical decompression fusion with redo at six - seven  Rm 33  . COLONOSCOPY  08/2014   polyps  . ESOPHAGOGASTRODUODENOSCOPY (EGD) WITH PROPOFOL N/A 06/23/2015   Procedure: ESOPHAGOGASTRODUODENOSCOPY (EGD) WITH PROPOFOL;  Surgeon: [name], MD;  Location: [facility];  Service: Endoscopy;  Laterality: N/A;  . NECK SURGERY    . POSTERIOR CERVICAL FUSION/FORAMINOTOMY N/A 08/26/2013   Procedure: C/4-5,C/5-6,C/6-7 Posterior Cervical Fusion w/lateral mass fixation;  Surgeon: Elaina Hoops, MD;  Location: [place] [facility];  Service: Neurosurgery;  Laterality: N/A;  . TUBAL LIGATION      Family History  Problem Relation Age of Onset  . Hypertension Mother   . Heart disease Mother   . Breast cancer Neg Hx     Social History:  reports that she has never smoked. She has never used smokeless tobacco. She reports that she does not drink alcohol or use  drugs.  Allergies  Allergen Reactions  . Aspirin Nausea Only  . Hydrochlorothiazide     Rash  . Lisinopril     Other reaction(s): Unknown  . Sulfa Antibiotics Hives and Nausea Only    Medications: I have reviewed the patient's current medications.  ROS: History obtained from the patient  General ROS: negative for - chills, fatigue, fever, night sweats, weight gain or weight loss Psychological ROS: negative for - behavioral disorder, hallucinations, memory difficulties, mood swings or suicidal ideation Ophthalmic ROS: negative for - blurry vision, double vision, eye pain or loss of vision ENT ROS: negative for - epistaxis, nasal discharge, oral lesions, sore throat, tinnitus or vertigo Allergy and Immunology ROS: negative for - hives or itchy/watery eyes Hematological and Lymphatic ROS: negative for - bleeding problems, bruising or swollen lymph nodes Endocrine ROS: negative for - galactorrhea, hair pattern changes, polydipsia/polyuria or temperature intolerance Respiratory ROS: negative for - cough, hemoptysis, shortness of breath or wheezing Cardiovascular ROS: negative for - chest pain, dyspnea on exertion, edema or irregular heartbeat Gastrointestinal ROS: negative for - abdominal pain, diarrhea, hematemesis, nausea/vomiting or stool incontinence Genito-Urinary ROS: negative for - dysuria, hematuria, incontinence or urinary frequency/urgency Musculoskeletal ROS: negative for - joint swelling or muscular weakness Neurological ROS: as noted in HPI Dermatological ROS: negative for rash and skin lesion changes  Physical Examination: Blood pressure 128/70, pulse 64, temperature 98 F (36.7 C), temperature source Oral, resp. rate 18, height 5'  5" (1.651 m), weight 78.4 kg (172 lb 12.8 oz), SpO2 96 %.   Neurological Examination   Mental Status: Alert, oriented, thought content appropriate.  Speech fluent without evidence of aphasia.  Able to follow 3 step commands without  difficulty. Cranial Nerves: II: Discs flat bilaterally; Visual fields grossly normal, pupils equal, round, reactive to light and accommodation III,IV, VI: ptosis not present, extra-ocular motions intact bilaterally V,VII: smile symmetric, facial light touch sensation normal bilaterally VIII: hearing normal bilaterally IX,X: gag reflex present XI: bilateral shoulder shrug XII: midline tongue extension Motor: Right : Upper extremity   5/5    Left:     Upper extremity   5/5  Lower extremity   5/5     Lower extremity   5/5 Tone and bulk:normal tone throughout; no atrophy noted Sensory: Pinprick and light touch intact throughout, bilaterally Deep Tendon Reflexes: 2+ and symmetric throughout Plantars: Right: downgoing   Left: downgoing Cerebellar: normal finger-to-nose, normal rapid alternating movements and normal heel-to-shin test Gait: not tested     Laboratory Studies:   Basic Metabolic Panel:  Recent Labs Lab 03/14/17 1343  NA 140  K 3.1*  CL 104  CO2 28  GLUCOSE 92  BUN 10  CREATININE 0.66  CALCIUM 9.1    Liver Function Tests: No results for input(s): AST, ALT, ALKPHOS, BILITOT, PROT, ALBUMIN in the last 168 hours. No results for input(s): LIPASE, AMYLASE in the last 168 hours. No results for input(s): AMMONIA in the last 168 hours.  CBC:  Recent Labs Lab 03/14/17 1343  WBC 10.7  HGB 13.9  HCT 40.7  MCV 81.5  PLT 383    Cardiac Enzymes:  Recent Labs Lab 03/14/17 1343 03/14/17 1724  TROPONINI <0.03 <0.03    BNP: Invalid input(s): POCBNP  CBG: No results for input(s): GLUCAP in the last 168 hours.  Microbiology: Results for orders placed or performed during the hospital encounter of 08/21/13  Surgical pcr screen     Status: Abnormal   Collection Time: 08/21/13 11:30 AM  Result Value Ref Range Status   MRSA, PCR NEGATIVE NEGATIVE Final   Staphylococcus aureus POSITIVE (A) NEGATIVE Final    Comment:        The Xpert SA Assay (FDA approved  for NASAL specimens in patients over 60 years of age), is one component of a comprehensive surveillance program.  Test performance has been validated by [employer] for patients greater than or equal to 63 year old. It is not intended to diagnose infection nor to guide or monitor treatment.    Coagulation Studies: No results for input(s): LABPROT, INR in the last 72 hours.  Urinalysis: No results for input(s): COLORURINE, LABSPEC, PHURINE, GLUCOSEU, HGBUR, BILIRUBINUR, KETONESUR, PROTEINUR, UROBILINOGEN, NITRITE, LEUKOCYTESUR in the last 168 hours.  Invalid input(s): APPERANCEUR  Lipid Panel:     Component Value Date/Time   CHOL 165 03/15/2017 0448   TRIG 156 (H) 03/15/2017 0448   HDL 41 03/15/2017 0448   CHOLHDL 4.0 03/15/2017 0448   VLDL 31 03/15/2017 0448   LDLCALC 93 03/15/2017 0448    HgbA1C:  Lab Results  Component Value Date   HGBA1C 6.0 (H) 03/14/2017    Urine Drug Screen:  No results found for: LABOPIA, COCAINSCRNUR, LABBENZ, AMPHETMU, THCU, LABBARB  Alcohol Level: No results for input(s): ETH in the last 168 hours.  Other results: EKG: normal EKG, normal sinus rhythm, unchanged from previous tracings.  Imaging: Dg Chest 2 View  Result Date: 03/14/2017 CLINICAL DATA:  Onset  of blurred vision, headache, and chest pain two days ago. Similar but less severe symptoms in the past. History of hypertension and hyperlipidemia. EXAM: CHEST  2 VIEW COMPARISON:  Chest x-ray of September 21, 2016. FINDINGS: The lungs are reasonably well inflated. The interstitial markings are coarse. The cardiac silhouette is normal. The pulmonary vascularity is not engorged. The mediastinum is normal in width. There is calcification in the wall of the aortic arch. The bony thorax and exhibits no acute abnormality. IMPRESSION: Mild interstitial prominence more conspicuous than on the previous study may reflect acute bronchitic change. There is no CHF nor alveolar pneumonia. Electronically  Signed   By: David  Martinique M.D.   On: 03/14/2017 14:00   Ct Head Wo Contrast  Result Date: 03/14/2017 CLINICAL DATA:  Headache with blurred vision EXAM: CT HEAD WITHOUT CONTRAST TECHNIQUE: Contiguous axial images were obtained from the base of the skull through the vertex without intravenous contrast. COMPARISON:  None. FINDINGS: Brain: The ventricles are normal in size and configuration. There is, however, mild to moderate cerebellar atrophy. There is no intracranial mass, hemorrhage, extra-axial fluid collection, or midline shift. Gray-white compartments appear normal. No acute infarct evident. Vascular: No hyperdense vessel. There are foci of calcification in each carotid siphon region. Skull: Foci of cortical thinning in the superior parietal regions is felt to be physiologic. No lytic or destructive lesions are identified. No sclerotic lesions evident. Sinuses/Orbits: There is opacification in the anterior, lateral right sphenoid sinus. There is mild debris in the left sphenoid sinus. There is mucosal thickening in multiple ethmoid air cells. There is mucosal thickening in the posterior ethmoid air cell region on the right. Other visualized paranasal sinuses appear unremarkable. Visualized orbits appear symmetric bilaterally. Other: Visualized mastoid air cells are clear. IMPRESSION: 1. Mild to moderate cerebellar atrophy. Ventricles normal in size and configuration. 2. No intracranial mass, hemorrhage, or extra-axial fluid collection. Gray-white compartments appear normal. 3.  There are foci of arterial vascular calcification. 4.  There are several foci of paranasal sinus disease. Electronically Signed   By: Lowella Grip III M.D.   On: 03/14/2017 18:20   Mr Brain Wo Contrast  Result Date: 03/14/2017 CLINICAL DATA:  61 y/o  F; 2 days of left-sided blurry vision. EXAM: MRI HEAD WITHOUT CONTRAST TECHNIQUE: Multiplanar, multiecho pulse sequences of the brain and surrounding structures were obtained  without intravenous contrast. COMPARISON:  None. FINDINGS: Brain: No acute infarction, hemorrhage, hydrocephalus, extra-axial collection or mass lesion. Few small foci of T2 FLAIR hyperintense signal abnormality in periventricular and frontal subcortical white matter are nonspecific but likely represent minimal chronic microvascular ischemic changes. Vascular: Normal flow voids. Skull and upper cervical spine: Normal marrow signal. Sinuses/Orbits: Right maxillary sinus mucous retention cyst. No abnormal signal of mastoid air cells. Orbits are unremarkable. Other: None. IMPRESSION: No acute intracranial abnormality. Unremarkable MRI of the brain for age. Electronically Signed   By: Kristine Garbe M.D.   On: 03/14/2017 21:19   US Carotid Bilateral  Result Date: 03/15/2017 CLINICAL DATA:  Visual disturbance, blurred vision, hypertension, hyperlipidemia none EXAM: BILATERAL CAROTID DUPLEX ULTRASOUND TECHNIQUE: Pearline Cables scale imaging, color Doppler and duplex ultrasound were performed of bilateral carotid and vertebral arteries in the neck. COMPARISON:  None available FINDINGS: Criteria: Quantification of carotid stenosis is based on velocity parameters that correlate the residual internal carotid diameter with NASCET-based stenosis levels, using the diameter of the distal internal carotid lumen as the denominator for stenosis measurement. The following velocity measurements were obtained: RIGHT ICA:  102/34 cm/sec CCA:  450/38 cm/sec SYSTOLIC ICA/CCA RATIO:  0.8 DIASTOLIC ICA/CCA RATIO:  1.7 ECA:  137 cm/sec LEFT ICA:  100/28 cm/sec CCA:  88/28 cm/sec SYSTOLIC ICA/CCA RATIO:  1.0 DIASTOLIC ICA/CCA RATIO:  1.4 ECA:  97 cm/sec RIGHT CAROTID ARTERY: Minor echogenic shadowing plaque formation. No hemodynamically significant right ICA stenosis, velocity elevation, or turbulent flow. Degree of narrowing less than 50%. RIGHT VERTEBRAL ARTERY:  Antegrade LEFT CAROTID ARTERY: Similar scattered minor echogenic plaque  formation. No hemodynamically significant left ICA stenosis, velocity elevation, or turbulent flow. LEFT VERTEBRAL ARTERY:  Antegrade IMPRESSION: Minor carotid atherosclerosis. No hemodynamically significant ICA stenosis. Degree of narrowing less than 50% bilaterally by ultrasound criteria. Patent antegrade vertebral flow Electronically Signed   By: Jerilynn Mages.  Shick M.D.   On: 03/15/2017 10:11     Assessment/Plan:  61 y.o. female with a known history of Hypertension, GERD, low back pain, arthritis presents to the emergency room complaining of 2 days of left-sided blurred vision. Started 2 days ago in AM and now has resolved. Only states L eye involved. Currently back to baseline.  No residual deficits. No anti platelet use at home.    - Back to baseline. - Imaging no acute abnormality - States allergic to ASA as has nasea from it.  - start plavix daily along with statin - d/c planning  03/15/2017, 1:35 PM

## 2017-03-16 LAB — HIV ANTIBODY (ROUTINE TESTING W REFLEX): HIV Screen 4th Generation wRfx: NONREACTIVE

## 2017-03-16 NOTE — Telephone Encounter (Signed)
Ok thanks 

## 2017-03-16 NOTE — Telephone Encounter (Signed)
Pt was considered an "observation" after being admitted from the ED. Unfortunately we do not complete TCM calls on these pts. Sorry! -MM

## 2017-03-18 NOTE — Discharge Summary (Signed)
[facility] [place] NAME: [name]    MR#:  [id number]  DATE OF BIRTH:  08-22-1955  DATE OF ADMISSION:  03/14/2017 ADMITTING PHYSICIAN: Hillary Bow, MD  DATE OF DISCHARGE: 03/15/2017  4:30 PM  PRIMARY CARE PHYSICIAN: Mar Daring, PA-C   ADMISSION DIAGNOSIS:  CVA (cerebral vascular accident) ([place]) [I63.9] Visual field cut [H53.40] Nonintractable episodic headache, unspecified headache type [R51]  DISCHARGE DIAGNOSIS:  Active Problems:   CVA (cerebral vascular accident) ([place])   SECONDARY DIAGNOSIS:   Past Medical History:  Diagnosis Date  . Allergy   . Anemia    takes Ferrous Sulfate daily  . Arthritis   . GERD (gastroesophageal reflux disease)    takes OTC med  . Hyperlipidemia   . Hypertension    takes Amlodipine daily  . Low back pain    occasionally     ADMITTING HISTORY  HISTORY OF PRESENT ILLNESS:  [name]  is a 61 y.o. female with a known history of Hypertension, GERD, low back pain, arthritis presents to the emergency room complaining of 2 days of left-sided blurred vision. She has also had some burning from GERD. Called her primary care physician and was sent to the emergency room. Here patient had a CT scan of the head which was negative. Was evaluated by tele neurology and suggested observation with MRI, echocardiogram and carotid Dopplers for CVA. Patient has no focal weakness or numbness. No swallowing problems. Never had similar symptoms in the past. Complains of vague right-sided headache which is improved today.   HOSPITAL COURSE:   * TIA with acute left eye vision changes MRI of the brain showed nothing acute. Echocardiogram and carotid Dopplers showed no significant findings. Patient was started on Plavix, statin. Seen by neurology Dr. Irish Elders who agreed with the plan. Patient is allergic to aspirin  * Hypertension. Patient will be continued on her home medications.  Stable for  discharge home.  CONSULTS OBTAINED:  Treatment Team:  Leotis Pain, MD  DRUG ALLERGIES:   Allergies  Allergen Reactions  . Aspirin Nausea Only  . Hydrochlorothiazide     Rash  . Lisinopril     Other reaction(s): Unknown  . Sulfa Antibiotics Hives and Nausea Only    DISCHARGE MEDICATIONS:   Discharge Medication List as of 03/15/2017  3:36 PM    START taking these medications   Details  atorvastatin (LIPITOR) 40 MG tablet Take 1 tablet (40 mg total) by mouth daily., Starting Wed 03/15/2017, Until Thu 03/15/2018, Normal    clopidogrel (PLAVIX) 75 MG tablet Take 1 tablet (75 mg total) by mouth daily., Starting Wed 03/15/2017, Normal      CONTINUE these medications which have NOT CHANGED   Details  acetaminophen (TYLENOL) 500 MG tablet Take 1,000 mg by mouth every 6 (six) hours as needed., Historical Med    amLODipine (NORVASC) 10 MG tablet Take 1 tablet (10 mg total) by mouth daily., Starting Tue 10/25/2016, Normal    benzonatate (TESSALON) 100 MG capsule Take 1 capsule (100 mg total) by mouth 2 (two) times daily as needed for cough., Starting Fri 12/09/2016, Normal    Cholecalciferol (VITAMIN D) 2000 UNITS tablet Take 2,000 Units by mouth daily., Until Discontinued, Historical Med    dexlansoprazole (DEXILANT) 60 MG capsule Take 1 capsule (60 mg total) by mouth daily., Starting Wed 12/07/2016, Normal    diphenhydrAMINE (BENADRYL) 25 MG tablet Take 25 mg by mouth as needed., Historical Med    ferrous  sulfate 325 (65 FE) MG tablet Take 325 mg by mouth daily with breakfast., Until Discontinued, Historical Med    fluticasone (FLONASE) 50 MCG/ACT nasal spray Place 2 sprays into both nostrils daily., Starting Thu 10/20/2016, Normal    Omega-3 Fatty Acids (FISH OIL) 1200 MG CAPS Take 1 capsule by mouth daily., Until Discontinued, Historical Med    Potassium 75 MG TABS Take by mouth., Historical Med    sucralfate (CARAFATE) 1 g tablet Take 1 tablet (1 g total) by mouth 4 (four)  times daily -  with meals and at bedtime., Starting Wed 12/07/2016, Normal      STOP taking these medications     simvastatin (ZOCOR) 20 MG tablet      methylPREDNISolone (MEDROL DOSEPAK) 4 MG TBPK tablet         Today   VITAL SIGNS:  Blood pressure 128/70, pulse 64, temperature 98 F (36.7 C), temperature source Oral, resp. rate 18, height 5\' 5"  (1.651 m), weight 78.4 kg (172 lb 12.8 oz), SpO2 96 %.  I/O:  No intake or output data in the 24 hours ending 03/18/17 1445  PHYSICAL EXAMINATION:  Physical Exam  GENERAL:  61 y.o.-year-old patient lying in the bed with no acute distress.  LUNGS: Normal breath sounds bilaterally, no wheezing, rales,rhonchi or crepitation. No use of accessory muscles of respiration.  CARDIOVASCULAR: S1, S2 normal. No murmurs, rubs, or gallops.  ABDOMEN: Soft, non-tender, non-distended. Bowel sounds present. No organomegaly or mass.  NEUROLOGIC: Moves all 4 extremities. PSYCHIATRIC: The patient is alert and oriented x 3.  SKIN: No obvious rash, lesion, or ulcer.   DATA REVIEW:   CBC  Recent Labs Lab 03/14/17 1343  WBC 10.7  HGB 13.9  HCT 40.7  PLT 383    Chemistries   Recent Labs Lab 03/14/17 1343  NA 140  K 3.1*  CL 104  CO2 28  GLUCOSE 92  BUN 10  CREATININE 0.66  CALCIUM 9.1    Cardiac Enzymes  Recent Labs Lab 03/14/17 1724  [place] <0.03    Microbiology Results  Results for orders placed or performed during the hospital encounter of 08/21/13  Surgical pcr screen     Status: Abnormal   Collection Time: 08/21/13 11:30 AM  Result Value Ref Range Status   MRSA, PCR NEGATIVE NEGATIVE Final   Staphylococcus aureus POSITIVE (A) NEGATIVE Final    Comment:        The Xpert SA Assay (FDA approved for NASAL specimens in patients over 46 years of age), is one component of a comprehensive surveillance program.  Test performance has been validated by [employer] for patients greater than or equal to 62 year old. It is  not intended to diagnose infection nor to guide or monitor treatment.    RADIOLOGY:  No results found.  Follow up with PCP in 1 week.  Management plans discussed with the patient, family and they are in agreement.  CODE STATUS:  Code Status History    Date Active Date Inactive Code Status Order ID Comments User Context   03/14/2017  7:40 PM 03/15/2017  7:40 PM Full Code [id number]  Hillary Bow, MD ED   08/26/2013 11:08 AM 08/28/2013  3:41 PM Full Code [id number]  Elaina Hoops, MD Inpatient      TOTAL TIME TAKING CARE OF THIS PATIENT ON DAY OF DISCHARGE: more than 30 minutes.   Hillary Bow R M.D on 03/18/2017 at 2:45 PM  Between 7am to 6pm - Pager - [phone]  After 6pm go to [email or web address] - password [email or web address] [facility]  Office  [phone]  CC: Primary care physician; Mar Daring, PA-C  Note: This dictation was prepared with Dragon dictation along with smaller phrase technology. Any transcriptional errors that result from this process are unintentional.

## 2017-03-22 ENCOUNTER — Ambulatory Visit (INDEPENDENT_AMBULATORY_CARE_PROVIDER_SITE_OTHER): Payer: Medicare HMO | Admitting: Physician Assistant

## 2017-03-22 ENCOUNTER — Encounter: Payer: Self-pay | Admitting: Physician Assistant

## 2017-03-22 ENCOUNTER — Other Ambulatory Visit
Admission: RE | Admit: 2017-03-22 | Discharge: 2017-03-22 | Disposition: A | Discharge status: Home / Self Care | Payer: Medicare HMO | Source: Ambulatory Visit | Attending: Physician Assistant | Admitting: Physician Assistant

## 2017-03-22 VITALS — BP 130/80 | HR 70 | Temp 98.7°F | Resp 16 | Wt 168.8 lb

## 2017-03-22 DIAGNOSIS — H538 Other visual disturbances: Secondary | ICD-10-CM | POA: Insufficient documentation

## 2017-03-22 DIAGNOSIS — G4452 New daily persistent headache (NDPH): Secondary | ICD-10-CM

## 2017-03-22 DIAGNOSIS — S129XXD Fracture of neck, unspecified, subsequent encounter: Secondary | ICD-10-CM

## 2017-03-22 LAB — CBC WITH DIFFERENTIAL/PLATELET
BASOS ABS: 0.1 10*3/uL (ref 0–0.1)
BASOS PCT: 1 %
EOS ABS: 0.1 10*3/uL (ref 0–0.7)
EOS PCT: 1 %
HCT: 41.4 % (ref 35.0–47.0)
HEMOGLOBIN: 13.8 g/dL (ref 12.0–16.0)
LYMPHS ABS: 3.1 10*3/uL (ref 1.0–3.6)
LYMPHS PCT: 36 %
MCH: 27.8 pg (ref 26.0–34.0)
MCHC: 33.4 g/dL (ref 32.0–36.0)
MCV: 83.2 fL (ref 80.0–100.0)
MONOS PCT: 7 %
Monocytes Absolute: 0.6 10*3/uL (ref 0.2–0.9)
NEUTROS ABS: 4.8 10*3/uL (ref 1.4–6.5)
NEUTROS PCT: 55 %
PLATELETS: 307 10*3/uL (ref 150–440)
RBC: 4.97 MIL/uL (ref 3.80–5.20)
RDW: 13.7 % (ref 11.5–14.5)
WBC: 8.6 10*3/uL (ref 3.6–11.0)

## 2017-03-22 LAB — SEDIMENTATION RATE: SED RATE: 42 mm/h — AB (ref 0–30)

## 2017-03-22 LAB — C-REACTIVE PROTEIN: CRP: 0.8 mg/dL (ref <1.0)

## 2017-03-22 MED ORDER — ACETAMINOPHEN 500 MG PO TABS: 1000.0000 mg | ORAL_TABLET | Freq: Four times a day (QID) | ORAL | 1 refills | Status: DC | PRN

## 2017-03-22 NOTE — Patient Instructions (Signed)
Temporal Arteritis  Temporal arteritis, also called giant cell arteritis, is a condition that causes arteries to become swollen (inflamed). It usually affects arteries in your head and face, but arteries in any part of the body can become inflamed. Temporal arteritis can cause serious problems, such as bone loss, diabetes, and blindness. What are the causes? The cause is unknown. What increases the risk?  Being older than 50.  Being a woman.  Being Caucasian.  Being of Danish, Swedish, Finnish, Norwegian, or Icelandic ancestry.  Having polymyalgia rheumatica (PMR). What are the signs or symptoms? Some people with temporal arteritis have just one symptom, while other have several symptoms. Most signs and symptoms are related to the head and face. Signs and symptoms may include:  Hard or swollen temples (common). Your temples are the flattened area on either side of your forehead. If your temples are swollen, it may hurt to touch them.  Pain when combing your hair or when laying your head down.  Pain in the jaw when chewing.  Pain in the throat or tongue.  Problems with your vision, such as sudden loss of vision in one eye, or seeing double.  Fever.  Fatigue.  A dry cough.  Pain in the hips and shoulders.  Pain in the arms during exercise.  Depression.  Weight loss.  How is this diagnosed? Your health care provider will ask about your symptoms and do a physical exam. He or she may also perform an eye exam and tests, such as:  A complete blood count.  An erythrocyte sedimentation rate test, also called the sed rate test.  A C-reactive protein (CRP) test.  A tissue sample (biopsy) test.  How is this treated? Temporal arteritis is treated with a type of medicine called a corticosteroid. Vision problems may be treated with additional medicines. You will need to see your health care provider while you are being treated. During follow-up visits, your health care  provider will check for problems by:  Performing blood tests and bone density tests.  Checking your blood pressure and blood sugar.  Follow these instructions at home:  Take medicines only as directed by your health care provider.  Take any vitamins or supplements that your health care provider suggests. These may include vitamin D and calcium, which help keep your bones from becoming weak.  Exercise. Talk with your health care provider about what exercises are okay for you to do. Usually exercises that increase your heart rate (aerobic exercise), such as walking, are recommended. Aerobic exercise helps control your blood pressure and prevent bone loss.  Follow a healthy diet. Include healthy sources of protein, fruits, vegetables, and whole grains in your diet. Following a healthy diet helps prevent bone damage and diabetes. Contact a health care provider if:  Your symptoms get worse.  Your fever, fatigue, headache, weight loss, or pain in your jaw gets worse.  You develop signs of infection, such as fever, swelling, redness, warmth, and tenderness. Get help right away if:  Your vision gets worse.  Your pain does not go away, even after you take pain medicine.  You have chest pain.  You have trouble breathing.  One side of your face or body suddenly becomes weak or numb. This information is not intended to replace advice given to you by your health care provider. Make sure you discuss any questions you have with your health care provider. Document Released: 05/08/2009 Document Revised: 03/10/2016 Document Reviewed: 09/04/2013 Elsevier Interactive Patient Education  2018 [employer]  

## 2017-03-22 NOTE — Progress Notes (Signed)
Patient: [name] Female    DOB: September 11, 1955   61 y.o.   MRN: [id number] Visit Date: 03/22/2017  Today's Provider: Mar Daring, PA-C   Chief Complaint  Patient presents with  . Hospitalization Follow-up   Subjective:    HPI   Follow up Hospitalization  Patient was admitted to [facility] on 03/14/17 and discharged on 03/15/17. She was treated for CVA/TIA with left eye visual changes. Treatment for this included CT scan of the head-Negative. TIA with acute left eye vision changes. MRI of the brain showed nothing acute.  Echo and Dopplers-No significant findings. Patient was started on Plavix, atorvastatin 86m. Seen by Neuro. She reports excellent compliance with treatment. She reports this condition is Improved.she reports that her left temporal area still hurts and around her left eye. She reports it is a dull headache that radiates from the neck on the left side, up around the left ear and to the left temporal area and left eye. No blurred vision currently.  She reports that she gets more tired with walking up stairs. ------------------------------------------------------------------------------------        Allergies  Allergen Reactions  . Aspirin Nausea Only  . Hydrochlorothiazide     Rash  . Lisinopril     Other reaction(s): Unknown  . Sulfa Antibiotics Hives and Nausea Only     Current Outpatient Prescriptions:  .  acetaminophen (TYLENOL) 500 MG tablet, Take 1,000 mg by mouth every 6 (six) hours as needed., Disp: , Rfl:  .  amLODipine (NORVASC) 10 MG tablet, Take 1 tablet (10 mg total) by mouth daily., Disp: 30 tablet, Rfl: 6 .  atorvastatin (LIPITOR) 40 MG tablet, Take 1 tablet (40 mg total) by mouth daily., Disp: 30 tablet, Rfl: 0 .  benzonatate (TESSALON) 100 MG capsule, Take 1 capsule (100 mg total) by mouth 2 (two) times daily as needed for cough., Disp: 20 capsule, Rfl: 0 .  Cholecalciferol (VITAMIN D) 2000 UNITS tablet, Take 2,000 Units by  mouth daily., Disp: , Rfl:  .  clopidogrel (PLAVIX) 75 MG tablet, Take 1 tablet (75 mg total) by mouth daily., Disp: 30 tablet, Rfl: 0 .  dexlansoprazole (DEXILANT) 60 MG capsule, Take 1 capsule (60 mg total) by mouth daily., Disp: 90 capsule, Rfl: 1 .  diphenhydrAMINE (BENADRYL) 25 MG tablet, Take 25 mg by mouth as needed., Disp: , Rfl:  .  ferrous sulfate 325 (65 FE) MG tablet, Take 325 mg by mouth daily with breakfast., Disp: , Rfl:  .  fluticasone (FLONASE) 50 MCG/ACT nasal spray, Place 2 sprays into both nostrils daily. (Patient taking differently: Place 2 sprays into both nostrils daily as needed. ), Disp: 16 g, Rfl: 5 .  Omega-3 Fatty Acids (FISH OIL) 1200 MG CAPS, Take 1 capsule by mouth daily., Disp: , Rfl:  .  Potassium 75 MG TABS, Take by mouth., Disp: , Rfl:  .  sucralfate (CARAFATE) 1 g tablet, Take 1 tablet (1 g total) by mouth 4 (four) times daily -  with meals and at bedtime., Disp: 120 tablet, Rfl: 5  Review of Systems  Constitutional: Positive for fatigue.  Eyes: Positive for visual disturbance.  Respiratory: Negative for cough, choking, chest tightness, shortness of breath and wheezing.   Cardiovascular: Negative for chest pain, palpitations and leg swelling.  Neurological: Positive for headaches. Negative for dizziness, tremors, seizures, syncope, facial asymmetry, speech difficulty, weakness, light-headedness and numbness.    Social History  Substance Use Topics  . Smoking status:  Never Smoker  . Smokeless tobacco: Never Used  . Alcohol use No   Objective:   BP 130/80 (BP Location: Left Arm, Patient Position: Sitting, Cuff Size: Normal)   Pulse 70   Temp 98.7 F (37.1 C) (Oral)   Resp 16   Wt 168 lb 12.8 oz (76.6 kg)   SpO2 97%   BMI 28.09 kg/m     Physical Exam  Constitutional: She is oriented to person, place, and time. She appears well-developed and well-nourished. No distress.  Neck: Normal range of motion. Neck supple. No JVD present. No tracheal  deviation present. No thyromegaly present.  Cardiovascular: Normal rate, regular rhythm and normal heart sounds.  Exam reveals no gallop and no friction rub.   No murmur heard. Pulmonary/Chest: Effort normal and breath sounds normal. No respiratory distress. She has no wheezes. She has no rales.  Lymphadenopathy:    She has no cervical adenopathy.  Neurological: She is alert and oriented to person, place, and time. She has normal strength. No cranial nerve deficit or sensory deficit. She displays a negative Romberg sign. Coordination and gait normal.  Skin: She is not diaphoretic.  Vitals reviewed.      Assessment & Plan:     1. New daily persistent headache Will check labs as below to R/O temporal arteritis. If labs are normal and headache persists, may consider referral to headache clinic in [place] with Dr. Tomi Likens to R/O atypical migraine. Patient is in agreement. It is also possible she is having occipital neuralgia headaches secondary to recent stress and h/o chronic neck issues and neck surgeries.  - CBC with Differential - Sed Rate (ESR) - C-reactive protein - acetaminophen (TYLENOL) 500 MG tablet; Take 2 tablets (1,000 mg total) by mouth every 6 (six) hours as needed.  Dispense: 90 tablet; Refill: 1  2. Blurred vision, left eye See above medical treatment plan. - CBC with Differential - Sed Rate (ESR) - C-reactive protein  3. Pseudoarthrosis of cervical spine, subsequent encounter See above medical treatment plan.       Mar Daring, PA-C  [facility]

## 2017-03-23 ENCOUNTER — Telehealth: Payer: Self-pay

## 2017-03-23 NOTE — Telephone Encounter (Signed)
-----   Message from Mar Daring, [place] sent at 03/23/2017 11:44 AM EDT ----- Labs are reassuring that it is not giant cell arteritis. Continue tylenol and pushing fluids. If headache persists over the next week or so call back so I can refer you to the headache clinic for further evaluation. I would also recommend you see an ophthalmologist to have your eyes checked as well, if you have not been seen this year.

## 2017-03-23 NOTE — Telephone Encounter (Signed)
Patient advised.

## 2017-04-17 ENCOUNTER — Telehealth: Payer: Self-pay | Admitting: Physician Assistant

## 2017-04-17 DIAGNOSIS — Z8673 Personal history of transient ischemic attack (TIA), and cerebral infarction without residual deficits: Secondary | ICD-10-CM

## 2017-04-17 DIAGNOSIS — E7800 Pure hypercholesterolemia, unspecified: Secondary | ICD-10-CM

## 2017-04-17 MED ORDER — CLOPIDOGREL BISULFATE 75 MG PO TABS: 75.0000 mg | ORAL_TABLET | Freq: Every day | ORAL | 0 refills | Status: DC

## 2017-04-17 MED ORDER — ATORVASTATIN CALCIUM 40 MG PO TABS: 40.0000 mg | ORAL_TABLET | Freq: Every day | ORAL | 1 refills | Status: DC

## 2017-04-17 NOTE — Telephone Encounter (Signed)
Patient advised.  Thanks,  -Shahara Hartsfield 

## 2017-04-17 NOTE — Telephone Encounter (Signed)
Pt wanting to know if she is supposed to still take the blood thinner and cholesterol medication she was put on after her stroke.  If so she would like a refill sent to [facility] in [place], pt states she is about out.

## 2017-04-17 NOTE — Telephone Encounter (Signed)
Please review.  Thanks,  -Lycan Davee 

## 2017-04-17 NOTE — Telephone Encounter (Signed)
Yes she should keep taking and medications have been sent to [facility] [address]

## 2017-04-21 ENCOUNTER — Ambulatory Visit: Payer: Medicare HMO | Admitting: Physician Assistant

## 2017-05-22 ENCOUNTER — Other Ambulatory Visit: Payer: Self-pay | Admitting: Physician Assistant

## 2017-05-22 DIAGNOSIS — K219 Gastro-esophageal reflux disease without esophagitis: Secondary | ICD-10-CM

## 2017-05-22 DIAGNOSIS — Z8673 Personal history of transient ischemic attack (TIA), and cerebral infarction without residual deficits: Secondary | ICD-10-CM

## 2017-05-31 ENCOUNTER — Encounter: Payer: Self-pay | Admitting: Physician Assistant

## 2017-05-31 ENCOUNTER — Ambulatory Visit (INDEPENDENT_AMBULATORY_CARE_PROVIDER_SITE_OTHER): Payer: Medicare HMO | Admitting: Physician Assistant

## 2017-05-31 VITALS — BP 106/62 | HR 62 | Temp 97.8°F | Resp 16 | Wt 169.0 lb

## 2017-05-31 DIAGNOSIS — J069 Acute upper respiratory infection, unspecified: Secondary | ICD-10-CM

## 2017-05-31 MED ORDER — GUAIFENESIN ER 600 MG PO TB12: 600.0000 mg | ORAL_TABLET | Freq: Two times a day (BID) | ORAL | 0 refills | Status: DC

## 2017-05-31 MED ORDER — AMOXICILLIN-POT CLAVULANATE 875-125 MG PO TABS: 1.0000 | ORAL_TABLET | Freq: Two times a day (BID) | ORAL | 0 refills | Status: DC

## 2017-05-31 NOTE — Progress Notes (Signed)
Patient: [name] Female    DOB: 1955-09-06   61 y.o.   MRN: [id number] Visit Date: 05/31/2017  Today's Provider: Mar Daring, PA-C   Chief Complaint  Patient presents with  . Headache   Subjective:    HPI Patient here today C/O abdominal pain, diarrhea, nausea, headache, cough and weakness since Sunday. Patient denies any blood in stools. Patient reports diarrhea is better today. Patient reports that cough makes head ache worse. Patient reports sinus pain and pressure, patient reports she took tylenol last night and helped with head ache. Patient reports good liquid in take and reports poor appetite. Patient denies any sick contacts.    Allergies  Allergen Reactions  . Aspirin Nausea Only  . Hydrochlorothiazide     Rash  . Lisinopril     Other reaction(s): Unknown  . Sulfa Antibiotics Hives and Nausea Only     Current Outpatient Medications:  .  acetaminophen (TYLENOL) 500 MG tablet, Take 2 tablets (1,000 mg total) by mouth every 6 (six) hours as needed., Disp: 90 tablet, Rfl: 1 .  amLODipine (NORVASC) 10 MG tablet, Take 1 tablet (10 mg total) by mouth daily., Disp: 30 tablet, Rfl: 6 .  atorvastatin (LIPITOR) 40 MG tablet, Take 1 tablet (40 mg total) by mouth daily., Disp: 90 tablet, Rfl: 1 .  benzonatate (TESSALON) 100 MG capsule, Take 1 capsule (100 mg total) by mouth 2 (two) times daily as needed for cough., Disp: 20 capsule, Rfl: 0 .  Cholecalciferol (VITAMIN D) 2000 UNITS tablet, Take 2,000 Units by mouth daily., Disp: , Rfl:  .  clopidogrel (PLAVIX) 75 MG tablet, TAKE 1 TABLET BY MOUTH ONCE DAILY, Disp: 90 tablet, Rfl: 1 .  DEXILANT 60 MG capsule, TAKE 1 CAPSULE BY MOUTH ONCE DAILY, Disp: 90 capsule, Rfl: 1 .  diphenhydrAMINE (BENADRYL) 25 MG tablet, Take 25 mg by mouth as needed., Disp: , Rfl:  .  ferrous sulfate 325 (65 FE) MG tablet, Take 325 mg by mouth daily with breakfast., Disp: , Rfl:  .  fluticasone (FLONASE) 50 MCG/ACT nasal spray, Place 2  sprays into both nostrils daily. (Patient taking differently: Place 2 sprays into both nostrils daily as needed. ), Disp: 16 g, Rfl: 5 .  Omega-3 Fatty Acids (FISH OIL) 1200 MG CAPS, Take 1 capsule by mouth daily., Disp: , Rfl:  .  Potassium 75 MG TABS, Take by mouth., Disp: , Rfl:  .  sucralfate (CARAFATE) 1 g tablet, Take 1 tablet (1 g total) by mouth 4 (four) times daily -  with meals and at bedtime., Disp: 120 tablet, Rfl: 5  Review of Systems  Constitutional: Positive for activity change, appetite change and fatigue.  HENT: Positive for congestion, postnasal drip, sinus pressure and sinus pain. Negative for rhinorrhea, sore throat and tinnitus.   Respiratory: Positive for cough. Negative for chest tightness and shortness of breath.   Cardiovascular: Negative.   Gastrointestinal: Positive for abdominal pain and diarrhea. Negative for abdominal distention, anal bleeding, blood in stool, constipation, nausea, rectal pain and vomiting.  Neurological: Positive for weakness and headaches.    Social History   Tobacco Use  . Smoking status: Never Smoker  . Smokeless tobacco: Never Used  Substance Use Topics  . Alcohol use: No   Objective:   BP 106/62 (BP Location: Left Arm, Patient Position: Sitting, Cuff Size: Large)   Pulse 62   Temp 97.8 F (36.6 C) (Oral)   Resp 16   Wt  169 lb (76.7 kg)   SpO2 97%   BMI 28.12 kg/m  Vitals:   05/31/17 0839  BP: 106/62  Pulse: 62  Resp: 16  Temp: 97.8 F (36.6 C)  TempSrc: Oral  SpO2: 97%  Weight: 169 lb (76.7 kg)     Physical Exam  Constitutional: She appears well-developed and well-nourished. No distress.  HENT:  Head: Normocephalic and atraumatic.  Right Ear: Hearing, tympanic membrane, external ear and ear canal normal.  Left Ear: Hearing, tympanic membrane, external ear and ear canal normal.  Nose: Mucosal edema present. No rhinorrhea. Right sinus exhibits no maxillary sinus tenderness and no frontal sinus tenderness. Left sinus  exhibits no maxillary sinus tenderness and no frontal sinus tenderness.  Mouth/Throat: Uvula is midline, oropharynx is clear and moist and mucous membranes are normal. No oropharyngeal exudate.  Eyes: Conjunctivae are normal. Pupils are equal, round, and reactive to light. Right eye exhibits no discharge. Left eye exhibits no discharge. No scleral icterus.  Neck: Normal range of motion. Neck supple. No tracheal deviation present. No thyromegaly present.  Cardiovascular: Normal rate, regular rhythm and normal heart sounds. Exam reveals no gallop and no friction rub.  No murmur heard. Pulmonary/Chest: Effort normal and breath sounds normal. No stridor. No respiratory distress. She has no wheezes. She has no rales.  Lymphadenopathy:    She has no cervical adenopathy.  Skin: Skin is warm and dry. She is not diaphoretic.  Vitals reviewed.       Assessment & Plan:     1. Upper respiratory tract infection, unspecified type Worsening symptoms that have not responded to OTC medications. Will give augmentin as below. Continue allergy medications. Stay well hydrated and get plenty of rest. Call if no symptom improvement or if symptoms worsen. - amoxicillin-clavulanate (AUGMENTIN) 875-125 MG tablet; Take 1 tablet 2 (two) times daily by mouth.  Dispense: 20 tablet; Refill: 0 - guaiFENesin (MUCINEX) 600 MG 12 hr tablet; Take 1 tablet (600 mg total) 2 (two) times daily by mouth.  Dispense: 30 tablet; Refill: 0       Mar Daring, PA-C  [facility]

## 2017-05-31 NOTE — Patient Instructions (Signed)
Upper Respiratory Infection, Adult Most upper respiratory infections (URIs) are caused by a virus. A URI affects the nose, throat, and upper air passages. The most common type of URI is often called "the common cold." Follow these instructions at home:  Take medicines only as told by your doctor.  Gargle warm saltwater or take cough drops to comfort your throat as told by your doctor.  Use a warm mist humidifier or inhale steam from a shower to increase air moisture. This may make it easier to breathe.  Drink enough fluid to keep your pee (urine) clear or pale yellow.  Eat soups and other clear broths.  Have a healthy diet.  Rest as needed.  Go back to work when your fever is gone or your doctor says it is okay. ? You may need to stay home longer to avoid giving your URI to others. ? You can also wear a face mask and wash your hands often to prevent spread of the virus.  Use your inhaler more if you have asthma.  Do not use any tobacco products, including cigarettes, chewing tobacco, or electronic cigarettes. If you need help quitting, ask your doctor. Contact a doctor if:  You are getting worse, not better.  Your symptoms are not helped by medicine.  You have chills.  You are getting more short of breath.  You have brown or red mucus.  You have yellow or brown discharge from your nose.  You have pain in your face, especially when you bend forward.  You have a fever.  You have puffy (swollen) neck glands.  You have pain while swallowing.  You have white areas in the back of your throat. Get help right away if:  You have very bad or constant: ? Headache. ? Ear pain. ? Pain in your forehead, behind your eyes, and over your cheekbones (sinus pain). ? Chest pain.  You have long-lasting (chronic) lung disease and any of the following: ? Wheezing. ? Long-lasting cough. ? Coughing up blood. ? A change in your usual mucus.  You have a stiff neck.  You have  changes in your: ? Vision. ? Hearing. ? Thinking. ? Mood. This information is not intended to replace advice given to you by your health care provider. Make sure you discuss any questions you have with your health care provider. Document Released: 12/28/2007 Document Revised: 03/13/2016 Document Reviewed: 10/16/2013 Elsevier Interactive Patient Education  2018 Elsevier Inc.  

## 2017-07-21 ENCOUNTER — Ambulatory Visit (INDEPENDENT_AMBULATORY_CARE_PROVIDER_SITE_OTHER): Payer: Medicare HMO | Admitting: Physician Assistant

## 2017-07-21 ENCOUNTER — Encounter: Payer: Self-pay | Admitting: Physician Assistant

## 2017-07-21 ENCOUNTER — Ambulatory Visit
Admission: RE | Admit: 2017-07-21 | Discharge: 2017-07-21 | Disposition: A | Discharge status: Home / Self Care | Payer: Medicare HMO | Source: Ambulatory Visit | Attending: Physician Assistant | Admitting: Physician Assistant

## 2017-07-21 VITALS — BP 130/88 | HR 64 | Temp 97.8°F | Resp 16 | Wt 169.8 lb

## 2017-07-21 DIAGNOSIS — M48061 Spinal stenosis, lumbar region without neurogenic claudication: Secondary | ICD-10-CM | POA: Diagnosis not present

## 2017-07-21 DIAGNOSIS — M542 Cervicalgia: Secondary | ICD-10-CM | POA: Insufficient documentation

## 2017-07-21 DIAGNOSIS — W19XXXA Unspecified fall, initial encounter: Secondary | ICD-10-CM

## 2017-07-21 DIAGNOSIS — M545 Low back pain: Secondary | ICD-10-CM | POA: Diagnosis not present

## 2017-07-21 MED ORDER — CYCLOBENZAPRINE HCL 5 MG PO TABS: 5.0000 mg | ORAL_TABLET | Freq: Three times a day (TID) | ORAL | 1 refills | Status: DC | PRN

## 2017-07-21 MED ORDER — METHYLPREDNISOLONE 4 MG PO TBPK: ORAL_TABLET | ORAL | 0 refills | Status: DC

## 2017-07-21 NOTE — Progress Notes (Signed)
Patient: [name] Female    DOB: 06-25-1956   61 y.o.   MRN: [id number] Visit Date: 07/21/2017  Today's Provider: Mar Daring, PA-C   Chief Complaint  Patient presents with  . Fall   Subjective:    Fall  Incident onset: 07/17/17. Fall occurred: Porch area. Distance fallen: Less then 77ft. She landed on concrete. The point of impact was the buttocks and head (She fell flat back onto her concrete porch. She reports she was stepping into her recycle bin from her porch to push it down further and one foot got hung as she was stepping backwards out of it that caused her to fall straight backwards onto her back.). The pain is present in the head and back. The pain is at a severity of 7/10. The pain is moderate. The symptoms are aggravated by movement. Pertinent negatives include no abdominal pain, loss of consciousness, nausea, numbness, visual change or vomiting. She has tried acetaminophen, rest and heat for the symptoms. The treatment provided mild relief.  Patient has had cervical spine fusions x 4 and is worried about her hardware. Denies any radiculopathy.    Allergies  Allergen Reactions  . Aspirin Nausea Only  . Hydrochlorothiazide     Rash  . Lisinopril     Other reaction(s): Unknown  . Sulfa Antibiotics Hives and Nausea Only     Current Outpatient Medications:  .  acetaminophen (TYLENOL) 500 MG tablet, Take 2 tablets (1,000 mg total) by mouth every 6 (six) hours as needed., Disp: 90 tablet, Rfl: 1 .  amLODipine (NORVASC) 10 MG tablet, Take 1 tablet (10 mg total) by mouth daily., Disp: 30 tablet, Rfl: 6 .  atorvastatin (LIPITOR) 40 MG tablet, Take 1 tablet (40 mg total) by mouth daily., Disp: 90 tablet, Rfl: 1 .  Cholecalciferol (VITAMIN D) 2000 UNITS tablet, Take 2,000 Units by mouth daily., Disp: , Rfl:  .  clopidogrel (PLAVIX) 75 MG tablet, TAKE 1 TABLET BY MOUTH ONCE DAILY, Disp: 90 tablet, Rfl: 1 .  DEXILANT 60 MG capsule, TAKE 1 CAPSULE BY MOUTH ONCE  DAILY, Disp: 90 capsule, Rfl: 1 .  diphenhydrAMINE (BENADRYL) 25 MG tablet, Take 25 mg by mouth as needed., Disp: , Rfl:  .  ferrous sulfate 325 (65 FE) MG tablet, Take 325 mg by mouth daily with breakfast., Disp: , Rfl:  .  fluticasone (FLONASE) 50 MCG/ACT nasal spray, Place 2 sprays into both nostrils daily. (Patient taking differently: Place 2 sprays into both nostrils daily as needed. ), Disp: 16 g, Rfl: 5 .  guaiFENesin (MUCINEX) 600 MG 12 hr tablet, Take 1 tablet (600 mg total) 2 (two) times daily by mouth., Disp: 30 tablet, Rfl: 0 .  Omega-3 Fatty Acids (FISH OIL) 1200 MG CAPS, Take 1 capsule by mouth daily., Disp: , Rfl:  .  Potassium 75 MG TABS, Take by mouth., Disp: , Rfl:  .  sucralfate (CARAFATE) 1 g tablet, Take 1 tablet (1 g total) by mouth 4 (four) times daily -  with meals and at bedtime., Disp: 120 tablet, Rfl: 5 .  amoxicillin-clavulanate (AUGMENTIN) 875-125 MG tablet, Take 1 tablet 2 (two) times daily by mouth. (Patient not taking: Reported on 07/21/2017), Disp: 20 tablet, Rfl: 0 .  benzonatate (TESSALON) 100 MG capsule, Take 1 capsule (100 mg total) by mouth 2 (two) times daily as needed for cough. (Patient not taking: Reported on 07/21/2017), Disp: 20 capsule, Rfl: 0  Review of Systems  Constitutional: Negative.  Respiratory: Negative for cough, chest tightness and shortness of breath.   Cardiovascular: Negative for chest pain, palpitations and leg swelling.  Gastrointestinal: Negative for abdominal pain, nausea and vomiting.  Musculoskeletal: Positive for arthralgias, back pain and myalgias. Negative for gait problem.  Neurological: Negative for loss of consciousness, weakness and numbness.    Social History   Tobacco Use  . Smoking status: Never Smoker  . Smokeless tobacco: Never Used  Substance Use Topics  . Alcohol use: No   Objective:   BP 130/88 (BP Location: Right Arm, Patient Position: Sitting, Cuff Size: Normal)   Pulse 64   Temp 97.8 F (36.6 C) (Oral)    Resp 16   Wt 169 lb 12.8 oz (77 kg)   SpO2 97%   BMI 28.26 kg/m    Physical Exam  Constitutional: She appears well-developed and well-nourished. No distress.  Neck: Neck supple. Muscular tenderness (paraspinals bilaterally) present. No spinous process tenderness present. Decreased range of motion (no change from baseline) present.  Cardiovascular: Normal rate, regular rhythm and normal heart sounds. Exam reveals no gallop and no friction rub.  No murmur heard. Pulmonary/Chest: Effort normal and breath sounds normal. No respiratory distress. She has no wheezes. She has no rales.  Musculoskeletal:       Cervical back: She exhibits decreased range of motion (at baseline), tenderness and spasm. She exhibits no bony tenderness.       Thoracic back: Normal.       Lumbar back: She exhibits tenderness and spasm. She exhibits normal range of motion and no bony tenderness.  Skin: She is not diaphoretic.  Vitals reviewed.      Assessment & Plan:     1. Fall, initial encounter Will treat with medrol dose pak and flexeril as below. No NSAIDs due to being on plavix and ASA from CVA. May use tylenol prn. Heat prn. Will get imaging of cervical spine and lumbar spine as below. I will f/u pending results. She is to call if no improvement in 2 weeks.  - methylPREDNISolone (MEDROL) 4 MG TBPK tablet; 6 day taper; take as directed on package instructions  Dispense: 21 tablet; Refill: 0 - cyclobenzaprine (FLEXERIL) 5 MG tablet; Take 1 tablet (5 mg total) by mouth 3 (three) times daily as needed for muscle spasms.  Dispense: 30 tablet; Refill: 1 - DG Cervical Spine Complete; Future - DG Lumbar Spine Complete; Future       Mar Daring, PA-C  [facility]

## 2017-07-21 NOTE — Patient Instructions (Signed)

## 2017-08-04 ENCOUNTER — Emergency Department
Admission: EM | Admit: 2017-08-04 | Discharge: 2017-08-05 | Disposition: A | Discharge status: Home / Self Care | Payer: Medicare HMO | Attending: Student in an Organized Health Care Education/Training Program | Admitting: Student in an Organized Health Care Education/Training Program

## 2017-08-04 ENCOUNTER — Encounter: Payer: Self-pay | Admitting: Emergency Medicine

## 2017-08-04 ENCOUNTER — Emergency Department: Payer: Medicare HMO

## 2017-08-04 ENCOUNTER — Other Ambulatory Visit: Payer: Self-pay

## 2017-08-04 DIAGNOSIS — R079 Chest pain, unspecified: Secondary | ICD-10-CM | POA: Insufficient documentation

## 2017-08-04 DIAGNOSIS — I10 Essential (primary) hypertension: Secondary | ICD-10-CM | POA: Insufficient documentation

## 2017-08-04 DIAGNOSIS — J181 Lobar pneumonia, unspecified organism: Secondary | ICD-10-CM | POA: Diagnosis not present

## 2017-08-04 DIAGNOSIS — Z7901 Long term (current) use of anticoagulants: Secondary | ICD-10-CM | POA: Diagnosis not present

## 2017-08-04 DIAGNOSIS — J40 Bronchitis, not specified as acute or chronic: Secondary | ICD-10-CM | POA: Diagnosis not present

## 2017-08-04 DIAGNOSIS — Z8673 Personal history of transient ischemic attack (TIA), and cerebral infarction without residual deficits: Secondary | ICD-10-CM | POA: Insufficient documentation

## 2017-08-04 DIAGNOSIS — R05 Cough: Secondary | ICD-10-CM | POA: Diagnosis present

## 2017-08-04 DIAGNOSIS — Z79899 Other long term (current) drug therapy: Secondary | ICD-10-CM | POA: Diagnosis not present

## 2017-08-04 DIAGNOSIS — J189 Pneumonia, unspecified organism: Secondary | ICD-10-CM

## 2017-08-04 DIAGNOSIS — R0602 Shortness of breath: Secondary | ICD-10-CM | POA: Diagnosis not present

## 2017-08-04 LAB — BASIC METABOLIC PANEL
Anion gap: 8 (ref 5–15)
BUN: 11 mg/dL (ref 6–20)
CALCIUM: 8.9 mg/dL (ref 8.9–10.3)
CO2: 26 mmol/L (ref 22–32)
Chloride: 107 mmol/L (ref 101–111)
Creatinine, Ser: 0.64 mg/dL (ref 0.44–1.00)
GFR calc Af Amer: >60 mL/min (ref >60)
GLUCOSE: 134 mg/dL — AB (ref 65–99)
Potassium: 3.5 mmol/L (ref 3.5–5.1)
Sodium: 141 mmol/L (ref 135–145)

## 2017-08-04 LAB — CBC
HCT: 39.8 % (ref 35.0–47.0)
Hemoglobin: 13.2 g/dL (ref 12.0–16.0)
MCH: 27.9 pg (ref 26.0–34.0)
MCHC: 33.2 g/dL (ref 32.0–36.0)
MCV: 84.1 fL (ref 80.0–100.0)
Platelets: 308 10*3/uL (ref 150–440)
RBC: 4.73 MIL/uL (ref 3.80–5.20)
RDW: 14 % (ref 11.5–14.5)
WBC: 10.8 10*3/uL (ref 3.6–11.0)

## 2017-08-04 LAB — TROPONIN I

## 2017-08-04 LAB — INFLUENZA PANEL BY PCR (TYPE A & B)
INFLAPCR: NEGATIVE
Influenza B By PCR: NEGATIVE

## 2017-08-04 MED ORDER — DOXYCYCLINE HYCLATE 100 MG PO TABS: 100.0000 mg | ORAL_TABLET | Freq: Two times a day (BID) | ORAL | 0 refills | Status: AC

## 2017-08-04 MED ORDER — PREDNISONE 20 MG PO TABS
60.0000 mg | ORAL_TABLET | Freq: Once | ORAL | Status: AC
  Administered 2017-08-04: 60 mg via ORAL
  Filled 2017-08-04: qty 3

## 2017-08-04 MED ORDER — IPRATROPIUM-ALBUTEROL 0.5-2.5 (3) MG/3ML IN SOLN
3.0000 mL | Freq: Once | RESPIRATORY_TRACT | Status: AC
  Administered 2017-08-04: 3 mL via RESPIRATORY_TRACT
  Filled 2017-08-04: qty 3

## 2017-08-04 MED ORDER — ALBUTEROL SULFATE HFA 108 (90 BASE) MCG/ACT IN AERS: 2.0000 | INHALATION_SPRAY | Freq: Four times a day (QID) | RESPIRATORY_TRACT | 2 refills | Status: DC | PRN

## 2017-08-04 MED ORDER — DOXYCYCLINE HYCLATE 100 MG PO TABS
100.0000 mg | ORAL_TABLET | Freq: Once | ORAL | Status: AC
  Administered 2017-08-04: 100 mg via ORAL
  Filled 2017-08-04: qty 1

## 2017-08-04 MED ORDER — PREDNISONE 10 MG PO TABS: 10.0000 mg | ORAL_TABLET | Freq: Every day | ORAL | 0 refills | Status: DC

## 2017-08-04 NOTE — ED Provider Notes (Signed)
[facility] Emergency Department Provider Note    First MD Initiated Contact with Patient 08/04/17 2232     (approximate)  I have reviewed the triage vital signs and the nursing notes.   HISTORY  Chief Complaint Chest Pain and Cough    HPI [name] is a 62 y.o. female resents with chief complaint of several days of progressively worsening cough and shortness of breath associated with worsening chest pain secondary to the cough became more severe today.  Has had low-grade fever.  States that she is coughing up thick phlegm.  Does have a history of bronchitis and states that this feels similar.  Does not smoke.  No history of heart attack.  Denies any lower extremity swelling.  No nausea.  Does have nasal congestion and sore throat.  States that chest pain is aching and worsened with coughing.  Past Medical History:  Diagnosis Date  . Allergy   . Anemia    takes Ferrous Sulfate daily  . Arthritis   . GERD (gastroesophageal reflux disease)    takes OTC med  . Hyperlipidemia   . Hypertension    takes Amlodipine daily  . Low back pain    occasionally   Family History  Problem Relation Age of Onset  . Hypertension Mother   . Heart disease Mother   . Breast cancer Neg Hx    Past Surgical History:  Procedure Laterality Date  . ANTERIOR CERVICAL DECOMP/DISCECTOMY FUSION  10/19/2011   Procedure: ANTERIOR CERVICAL DECOMPRESSION/DISCECTOMY FUSION 1 LEVEL/HARDWARE REMOVAL;  Surgeon: Elaina Hoops, MD;  Location: [place];  Service: Neurosurgery;  Laterality: N/A;  Cervical four - five  Anterior cervical decompression fusion with redo at six - seven  Rm 33  . COLONOSCOPY  08/2014   polyps  . ESOPHAGOGASTRODUODENOSCOPY (EGD) WITH PROPOFOL N/A 06/23/2015   Procedure: ESOPHAGOGASTRODUODENOSCOPY (EGD) WITH PROPOFOL;  Surgeon: [name], MD;  Location: [facility] ENDOSCOPY;  Service: Endoscopy;  Laterality: N/A;  . NECK SURGERY    . POSTERIOR CERVICAL  FUSION/FORAMINOTOMY N/A 08/26/2013   Procedure: C/4-5,C/5-6,C/6-7 Posterior Cervical Fusion w/lateral mass fixation;  Surgeon: Elaina Hoops, MD;  Location: [place] [facility];  Service: Neurosurgery;  Laterality: N/A;  . TUBAL LIGATION     Patient Active Problem List   Diagnosis Date Noted  . CVA (cerebral vascular accident) ([place]) 03/14/2017  . Gastritis   . BPPV (benign paroxysmal positional vertigo) 03/24/2015  . Right hip pain 01/07/2015  . Absolute anemia 01/06/2015  . BMI 29.0-29.9,adult 01/06/2015  . Cervical disc disease 01/06/2015  . Atypical chest pain 01/06/2015  . Generalized pruritus 01/06/2015  . Acid reflux 01/06/2015  . Cervicogenic headache 01/06/2015  . Hypercholesteremia 01/06/2015  . High potassium 01/06/2015  . Low back pain 01/06/2015  . Pseudoarthrosis of cervical spine ([place]) 08/26/2013  . Accumulation of fluid in tissues 12/24/2009  . Avitaminosis D 10/15/2009  . Cardiac conduction disorder 09/30/2008  . Essential (primary) hypertension 09/30/2008  . Blood in the urine 09/30/2008      Prior to Admission medications   Medication Sig Start Date End Date Taking? Authorizing Provider  acetaminophen (TYLENOL) 500 MG tablet Take 2 tablets (1,000 mg total) by mouth every 6 (six) hours as needed. 03/22/17   Mar Daring, PA-C  amLODipine (NORVASC) 10 MG tablet Take 1 tablet (10 mg total) by mouth daily. 10/25/16   Mar Daring, PA-C  atorvastatin (LIPITOR) 40 MG tablet Take 1 tablet (40 mg total) by mouth daily.  04/17/17 04/17/18  Mar Daring, PA-C  Cholecalciferol (VITAMIN D) 2000 UNITS tablet Take 2,000 Units by mouth daily.    [provider]  clopidogrel (PLAVIX) 75 MG tablet TAKE 1 TABLET BY MOUTH ONCE DAILY 05/22/17   Mar Daring, PA-C  cyclobenzaprine (FLEXERIL) 5 MG tablet Take 1 tablet (5 mg total) by mouth 3 (three) times daily as needed for muscle spasms. 07/21/17   Mar Daring, PA-C  DEXILANT 60 MG capsule TAKE 1  CAPSULE BY MOUTH ONCE DAILY 05/22/17   Mar Daring, PA-C  diphenhydrAMINE (BENADRYL) 25 MG tablet Take 25 mg by mouth as needed.    [provider]  ferrous sulfate 325 (65 FE) MG tablet Take 325 mg by mouth daily with breakfast.    [provider]  fluticasone (FLONASE) 50 MCG/ACT nasal spray Place 2 sprays into both nostrils daily. Patient taking differently: Place 2 sprays into both nostrils daily as needed.  10/20/16   Carmon Ginsberg, PA  guaiFENesin (MUCINEX) 600 MG 12 hr tablet Take 1 tablet (600 mg total) 2 (two) times daily by mouth. 05/31/17   Mar Daring, PA-C  methylPREDNISolone (MEDROL) 4 MG TBPK tablet 6 day taper; take as directed on package instructions 07/21/17   Mar Daring, PA-C  Omega-3 Fatty Acids (FISH OIL) 1200 MG CAPS Take 1 capsule by mouth daily.    [provider]  Potassium 75 MG TABS Take by mouth.    [provider]  sucralfate (CARAFATE) 1 g tablet Take 1 tablet (1 g total) by mouth 4 (four) times daily -  with meals and at bedtime. 12/07/16   Mar Daring, PA-C    Allergies Aspirin; Hydrochlorothiazide; Lisinopril; and Sulfa antibiotics    Social History Social History   Tobacco Use  . Smoking status: Never Smoker  . Smokeless tobacco: Never Used  Substance Use Topics  . Alcohol use: No  . Drug use: No    Review of Systems Patient denies headaches, rhinorrhea, blurry vision, numbness, shortness of breath, chest pain, edema, cough, abdominal pain, nausea, vomiting, diarrhea, dysuria, fevers, rashes or hallucinations unless otherwise stated above in HPI. ____________________________________________   PHYSICAL EXAM:  VITAL SIGNS: Vitals:   08/04/17 1829  BP: 137/72  Pulse: 95  Resp: 16  Temp: 99.1 F (37.3 C)  SpO2: 95%    Constitutional: Alert and oriented. Well appearing and in no acute distress. Eyes: Conjunctivae are normal.  Head: Atraumatic. Nose: +  congestion/rhinnorhea. Mouth/Throat: Mucous membranes are moist.   Neck: No stridor. Painless ROM.  Cardiovascular: Normal rate, regular rhythm. Grossly normal heart sounds.  Good peripheral circulation. Respiratory: Normal respiratory effort.  No retractions.  With occasional inspiratory wheeze particularly the left lung field.  Scattered wheezes throughout Gastrointestinal: Soft and nontender. No distention. No abdominal bruits. No CVA tenderness. Musculoskeletal: No lower extremity tenderness nor edema.  No joint effusions. Neurologic:  Normal speech and language. No gross focal neurologic deficits are appreciated. No facial droop Skin:  Skin is warm, dry and intact. No rash noted. Psychiatric: Mood and affect are normal. Speech and behavior are normal.  ____________________________________________   LABS (all labs ordered are listed, but only abnormal results are displayed)  Results for orders placed or performed during the hospital encounter of 08/04/17 (from the past 24 hour(s))  Basic metabolic panel     Status: Abnormal   Collection Time: 08/04/17  6:25 PM  Result Value Ref Range   Sodium 141 135 - 145 mmol/L  Potassium 3.5 3.5 - 5.1 mmol/L   Chloride 107 101 - 111 mmol/L   CO2 26 22 - 32 mmol/L   Glucose, Bld 134 (H) 65 - 99 mg/dL   BUN 11 6 - 20 mg/dL   Creatinine, Ser 0.64 0.44 - 1.00 mg/dL   Calcium 8.9 8.9 - 10.3 mg/dL   GFR calc non Af Amer >60 >60 mL/min   GFR calc Af Amer >60 >60 mL/min   Anion gap 8 5 - 15  CBC     Status: None   Collection Time: 08/04/17  6:25 PM  Result Value Ref Range   WBC 10.8 3.6 - 11.0 K/uL   RBC 4.73 3.80 - 5.20 MIL/uL   Hemoglobin 13.2 12.0 - 16.0 g/dL   HCT 39.8 35.0 - 47.0 %   MCV 84.1 80.0 - 100.0 fL   MCH 27.9 26.0 - 34.0 pg   MCHC 33.2 32.0 - 36.0 g/dL   RDW 14.0 11.5 - 14.5 %   Platelets 308 150 - 440 K/uL  Troponin I     Status: None   Collection Time: 08/04/17  6:25 PM  Result Value Ref Range   Troponin I <0.03 <0.03  ng/mL   ____________________________________________  EKG My review and personal interpretation at Time: 18:29   Indication: chest pain  Rate: 95  Rhythm: sinus Axis: normal Other: normal intervals, no stemi, ____________________________________________  RADIOLOGY  I personally reviewed all radiographic images ordered to evaluate for the above acute complaints and reviewed radiology reports and findings.  These findings were personally discussed with the patient.  Please see medical record for radiology report.  ____________________________________________   PROCEDURES  Procedure(s) performed:  Procedures    Critical Care performed: no ____________________________________________   INITIAL IMPRESSION / ASSESSMENT AND PLAN / ED COURSE  Pertinent labs & imaging results that were available during my care of the patient were reviewed by me and considered in my medical decision making (see chart for details).  DDX: Pneumonia, flu, ACS, PE, dehydration, congestive heart failure costochondritis  CARLETHIA MESQUITA is a 62 y.o. who presents to the ED with was as described above.  Patient in no acute distress and no hypoxia.  Patient is AFVSS in ED. Exam as above. Given current presentation have considered the above differential.  EKG shows no ischemia and troponin is negative.  This not clinically consistent with dissection or PE.  Abdominal exam is soft and benign.  Presentation most consistent with bronchitis with probable early developing pneumonia in the left lingula.  Will give steroid as well as nebulizer and initiate antibiotics given productive cough with concern for consolidation.  Will check flu.  Plan discharge home with outpatient follow-up.  Have discussed with the patient and available family all diagnostics and treatments performed thus far and all questions were answered to the best of my ability. The patient demonstrates understanding and agreement with plan.         ____________________________________________   FINAL CLINICAL IMPRESSION(S) / ED DIAGNOSES  Final diagnoses:  Bronchitis  Community acquired pneumonia of left upper lobe of lung ([place])      NEW MEDICATIONS STARTED DURING THIS VISIT:  New Prescriptions   No medications on file     Note:  This document was prepared using Dragon voice recognition software and may include unintentional dictation errors.    Merlyn Lot, MD 08/04/17 2246

## 2017-08-04 NOTE — ED Notes (Signed)
Pt denies needs at this time. Family at bedside.  

## 2017-08-04 NOTE — ED Notes (Signed)
Report from kelly, rn 

## 2017-08-04 NOTE — ED Triage Notes (Signed)
Pt to ED c/o central chest pain that started today. Pt denies radiation of the pain. Pt states that she has also had a cough and has felt tired. Pt denies N/V. Pt states that she has felt short of breath and has had a sore throat. Pt denies fever. Pt in NAD at this time.

## 2017-08-11 ENCOUNTER — Ambulatory Visit (INDEPENDENT_AMBULATORY_CARE_PROVIDER_SITE_OTHER): Payer: Medicare HMO | Admitting: Physician Assistant

## 2017-08-11 ENCOUNTER — Encounter: Payer: Self-pay | Admitting: Physician Assistant

## 2017-08-11 ENCOUNTER — Ambulatory Visit
Admission: RE | Admit: 2017-08-11 | Discharge: 2017-08-11 | Disposition: A | Discharge status: Home / Self Care | Payer: Medicare HMO | Source: Ambulatory Visit | Attending: Physician Assistant | Admitting: Physician Assistant

## 2017-08-11 ENCOUNTER — Telehealth: Payer: Self-pay

## 2017-08-11 VITALS — BP 110/60 | HR 77 | Temp 98.0°F | Resp 16 | Wt 168.0 lb

## 2017-08-11 DIAGNOSIS — J181 Lobar pneumonia, unspecified organism: Secondary | ICD-10-CM

## 2017-08-11 DIAGNOSIS — J189 Pneumonia, unspecified organism: Secondary | ICD-10-CM

## 2017-08-11 DIAGNOSIS — R918 Other nonspecific abnormal finding of lung field: Secondary | ICD-10-CM | POA: Diagnosis not present

## 2017-08-11 NOTE — Progress Notes (Signed)
Patient: [name] Female    DOB: 07/19/1956   62 y.o.   MRN: [id number] Visit Date: 08/11/2017  Today's Provider: Mar Daring, PA-C   Chief Complaint  Patient presents with  . Follow-up   Subjective:    HPI  Follow Up ER Visit  Patient is here for ER follow up.  She was recently seen at [facility] for bronchitis/pneumonia on 08/04/2017. Treatment for this included Labs,EKG,CXRay. Start Doxycycline,Duoneb and Prednisone. She reports excellent compliance with treatment. She reports this condition is Improved. Patient reports that she feels better but still hurting on her left side under her chest radiating to the back. Left side. Some wheezing,cough,SOB. She reports that she is drinking plenty of fluids. ------------------------------------------------------------------------------------     Allergies  Allergen Reactions  . Aspirin Nausea Only  . Hydrochlorothiazide     Rash  . Lisinopril     Other reaction(s): Unknown  . Sulfa Antibiotics Hives and Nausea Only     Current Outpatient Medications:  .  acetaminophen (TYLENOL) 500 MG tablet, Take 2 tablets (1,000 mg total) by mouth every 6 (six) hours as needed., Disp: 90 tablet, Rfl: 1 .  albuterol (PROVENTIL HFA;VENTOLIN HFA) 108 (90 Base) MCG/ACT inhaler, Inhale 2 puffs into the lungs every 6 (six) hours as needed for wheezing or shortness of breath., Disp: 1 Inhaler, Rfl: 2 .  amLODipine (NORVASC) 10 MG tablet, Take 1 tablet (10 mg total) by mouth daily., Disp: 30 tablet, Rfl: 6 .  atorvastatin (LIPITOR) 40 MG tablet, Take 1 tablet (40 mg total) by mouth daily., Disp: 90 tablet, Rfl: 1 .  Cholecalciferol (VITAMIN D) 2000 UNITS tablet, Take 2,000 Units by mouth daily., Disp: , Rfl:  .  clopidogrel (PLAVIX) 75 MG tablet, TAKE 1 TABLET BY MOUTH ONCE DAILY, Disp: 90 tablet, Rfl: 1 .  cyclobenzaprine (FLEXERIL) 5 MG tablet, Take 1 tablet (5 mg total) by mouth 3 (three) times daily as needed for muscle spasms.,  Disp: 30 tablet, Rfl: 1 .  DEXILANT 60 MG capsule, TAKE 1 CAPSULE BY MOUTH ONCE DAILY, Disp: 90 capsule, Rfl: 1 .  diphenhydrAMINE (BENADRYL) 25 MG tablet, Take 25 mg by mouth as needed., Disp: , Rfl:  .  doxycycline (VIBRA-TABS) 100 MG tablet, Take 1 tablet (100 mg total) by mouth 2 (two) times daily for 7 days., Disp: 14 tablet, Rfl: 0 .  ferrous sulfate 325 (65 FE) MG tablet, Take 325 mg by mouth daily with breakfast., Disp: , Rfl:  .  fluticasone (FLONASE) 50 MCG/ACT nasal spray, Place 2 sprays into both nostrils daily. (Patient taking differently: Place 2 sprays into both nostrils daily as needed. ), Disp: 16 g, Rfl: 5 .  guaiFENesin (MUCINEX) 600 MG 12 hr tablet, Take 1 tablet (600 mg total) 2 (two) times daily by mouth., Disp: 30 tablet, Rfl: 0 .  methylPREDNISolone (MEDROL) 4 MG TBPK tablet, 6 day taper; take as directed on package instructions, Disp: 21 tablet, Rfl: 0 .  Omega-3 Fatty Acids (FISH OIL) 1200 MG CAPS, Take 1 capsule by mouth daily., Disp: , Rfl:  .  Potassium 75 MG TABS, Take by mouth., Disp: , Rfl:  .  predniSONE (DELTASONE) 10 MG tablet, Take 1 tablet (10 mg total) by mouth daily. Day 1-2: Take 50 mg  ( 5 pills) Day 3-4 : Take 40 mg (4pills) Day 5-6: Take 30 mg (3 pills) Day 7-8:  Take 20 mg (2 pills) Day 9:  Take 10mg  (1 pill), Disp: 29  tablet, Rfl: 0 .  sucralfate (CARAFATE) 1 g tablet, Take 1 tablet (1 g total) by mouth 4 (four) times daily -  with meals and at bedtime., Disp: 120 tablet, Rfl: 5  Review of Systems  Constitutional: Negative.  Negative for fever (while in the hospital-much better).  HENT: Negative.   Respiratory: Positive for cough, shortness of breath and wheezing. Negative for chest tightness.   Cardiovascular: Negative for chest pain, palpitations and leg swelling.  Gastrointestinal: Negative.   Neurological: Negative.     Social History   Tobacco Use  . Smoking status: Never Smoker  . Smokeless tobacco: Never Used  Substance Use Topics  .  Alcohol use: No   Objective:   BP 110/60 (BP Location: Left Arm, Patient Position: Sitting, Cuff Size: Normal)   Pulse 77   Temp 98 F (36.7 C) (Oral)   Resp 16   Wt 168 lb (76.2 kg)   SpO2 97%   BMI 27.96 kg/m    Physical Exam  Constitutional: She appears well-developed and well-nourished. No distress.  HENT:  Head: Normocephalic and atraumatic.  Right Ear: Hearing, tympanic membrane, external ear and ear canal normal.  Left Ear: Hearing, tympanic membrane, external ear and ear canal normal.  Nose: Nose normal.  Mouth/Throat: Uvula is midline, oropharynx is clear and moist and mucous membranes are normal. No oropharyngeal exudate.  Eyes: Conjunctivae are normal. Pupils are equal, round, and reactive to light. Right eye exhibits no discharge. Left eye exhibits no discharge. No scleral icterus.  Neck: Normal range of motion. Neck supple. No tracheal deviation present. No thyromegaly present.  Cardiovascular: Normal rate, regular rhythm and normal heart sounds. Exam reveals no gallop and no friction rub.  No murmur heard. Pulmonary/Chest: Effort normal and breath sounds normal. No stridor. No respiratory distress. She has no wheezes. She has no rales.  Lymphadenopathy:    She has no cervical adenopathy.  Skin: Skin is warm and dry. She is not diaphoretic.  Vitals reviewed.       Assessment & Plan:     1. Pneumonia of left lower lobe due to infectious organism [facility]) Improving. No wheezes or rales heard today. Still with some cough and SOB but improving per patient. Will recheck CXR to make sure clearing. I will f/u pending results. She is to call if symptoms worsen again.  - DG Chest 2 View; Future       Mar Daring, PA-C  [facility]

## 2017-08-11 NOTE — Telephone Encounter (Signed)
Patient advised as below.  

## 2017-08-11 NOTE — Telephone Encounter (Signed)
-----   Message from Mar Daring, [place] sent at 08/11/2017  2:24 PM EST ----- Mild improvement noted in CXR. Would recommend to recheck in one week again.

## 2017-08-11 NOTE — Patient Instructions (Signed)

## 2017-08-17 ENCOUNTER — Telehealth: Payer: Self-pay | Admitting: Physician Assistant

## 2017-08-17 DIAGNOSIS — J189 Pneumonia, unspecified organism: Secondary | ICD-10-CM

## 2017-08-17 DIAGNOSIS — W19XXXA Unspecified fall, initial encounter: Secondary | ICD-10-CM

## 2017-08-17 MED ORDER — CYCLOBENZAPRINE HCL 5 MG PO TABS: 5.0000 mg | ORAL_TABLET | Freq: Three times a day (TID) | ORAL | 1 refills | Status: DC | PRN

## 2017-08-17 MED ORDER — DOXYCYCLINE HYCLATE 100 MG PO TABS: 100.0000 mg | ORAL_TABLET | Freq: Two times a day (BID) | ORAL | 0 refills | Status: DC

## 2017-08-17 NOTE — Telephone Encounter (Signed)
Pt contacted office for refill request on the following medications:  1. cyclobenzaprine (FLEXERIL) 5 MG tablet  Pt stated this has been helping her with her muscle spasms and pain that she has due to her cough 2. doxycycline (VIBRA-TABS) 100 MG tablet  Pt was started on this medication in the hospital and finished the medication on or near 08/11/17.   Pt would like both medications sent to [facility]. Pt is schedule for NOV on Monday 08/21/17 @ 830. Pt was asking to be seen tomorrow 08/18/17 but pt didn't want to be seen in the afternoon due to her ride. Please advise. Thanks TNP

## 2017-08-17 NOTE — Telephone Encounter (Signed)
Error as below.

## 2017-08-17 NOTE — Telephone Encounter (Signed)
Advised patient of results.  

## 2017-08-17 NOTE — Telephone Encounter (Signed)
Meds sent in. I gave her 5 more days of doxy to cover her until Monday.

## 2017-08-17 NOTE — Telephone Encounter (Signed)
Please advise. Thank Edrick Kins, RMA

## 2017-08-21 ENCOUNTER — Encounter: Payer: Self-pay | Admitting: Physician Assistant

## 2017-08-21 ENCOUNTER — Ambulatory Visit (INDEPENDENT_AMBULATORY_CARE_PROVIDER_SITE_OTHER): Payer: Medicare HMO | Admitting: Physician Assistant

## 2017-08-21 VITALS — BP 148/82 | HR 88 | Temp 98.8°F | Resp 16 | Wt 174.0 lb

## 2017-08-21 DIAGNOSIS — M545 Low back pain, unspecified: Secondary | ICD-10-CM

## 2017-08-21 DIAGNOSIS — J189 Pneumonia, unspecified organism: Secondary | ICD-10-CM

## 2017-08-21 DIAGNOSIS — J181 Lobar pneumonia, unspecified organism: Secondary | ICD-10-CM

## 2017-08-21 NOTE — Patient Instructions (Signed)

## 2017-08-21 NOTE — Progress Notes (Signed)
Patient: [name] Female    DOB: Mar 25, 1956   62 y.o.   MRN: [id number] Visit Date: 08/21/2017  Today's Provider: Mar Daring, PA-C   Chief Complaint  Patient presents with  . Follow-up  . Pneumonia   Subjective:    HPI  Patient comes in today for a follow up. She was seen in the office on 08/11/17 due to having pneumonia in the ER. She reports that she is feeling much better. She still has a tenderness feeling in her upper left side but reports that this is resolved with taking Flexeril.     Allergies  Allergen Reactions  . Aspirin Nausea Only  . Hydrochlorothiazide     Rash  . Lisinopril     Other reaction(s): Unknown  . Sulfa Antibiotics Hives and Nausea Only     Current Outpatient Medications:  .  acetaminophen (TYLENOL) 500 MG tablet, Take 2 tablets (1,000 mg total) by mouth every 6 (six) hours as needed., Disp: 90 tablet, Rfl: 1 .  albuterol (PROVENTIL HFA;VENTOLIN HFA) 108 (90 Base) MCG/ACT inhaler, Inhale 2 puffs into the lungs every 6 (six) hours as needed for wheezing or shortness of breath., Disp: 1 Inhaler, Rfl: 2 .  amLODipine (NORVASC) 10 MG tablet, Take 1 tablet (10 mg total) by mouth daily., Disp: 30 tablet, Rfl: 6 .  atorvastatin (LIPITOR) 40 MG tablet, Take 1 tablet (40 mg total) by mouth daily., Disp: 90 tablet, Rfl: 1 .  Cholecalciferol (VITAMIN D) 2000 UNITS tablet, Take 2,000 Units by mouth daily., Disp: , Rfl:  .  clopidogrel (PLAVIX) 75 MG tablet, TAKE 1 TABLET BY MOUTH ONCE DAILY, Disp: 90 tablet, Rfl: 1 .  cyclobenzaprine (FLEXERIL) 5 MG tablet, Take 1 tablet (5 mg total) by mouth 3 (three) times daily as needed for muscle spasms., Disp: 30 tablet, Rfl: 1 .  DEXILANT 60 MG capsule, TAKE 1 CAPSULE BY MOUTH ONCE DAILY, Disp: 90 capsule, Rfl: 1 .  diphenhydrAMINE (BENADRYL) 25 MG tablet, Take 25 mg by mouth as needed., Disp: , Rfl:  .  doxycycline (VIBRA-TABS) 100 MG tablet, Take 1 tablet (100 mg total) by mouth 2 (two) times daily.,  Disp: 10 tablet, Rfl: 0 .  ferrous sulfate 325 (65 FE) MG tablet, Take 325 mg by mouth daily with breakfast., Disp: , Rfl:  .  fluticasone (FLONASE) 50 MCG/ACT nasal spray, Place 2 sprays into both nostrils daily. (Patient taking differently: Place 2 sprays into both nostrils daily as needed. ), Disp: 16 g, Rfl: 5 .  guaiFENesin (MUCINEX) 600 MG 12 hr tablet, Take 1 tablet (600 mg total) 2 (two) times daily by mouth., Disp: 30 tablet, Rfl: 0 .  Omega-3 Fatty Acids (FISH OIL) 1200 MG CAPS, Take 1 capsule by mouth daily., Disp: , Rfl:  .  Potassium 75 MG TABS, Take by mouth., Disp: , Rfl:  .  sucralfate (CARAFATE) 1 g tablet, Take 1 tablet (1 g total) by mouth 4 (four) times daily -  with meals and at bedtime., Disp: 120 tablet, Rfl: 5 .  methylPREDNISolone (MEDROL) 4 MG TBPK tablet, 6 day taper; take as directed on package instructions (Patient not taking: Reported on 08/21/2017), Disp: 21 tablet, Rfl: 0 .  predniSONE (DELTASONE) 10 MG tablet, Take 1 tablet (10 mg total) by mouth daily. Day 1-2: Take 50 mg  ( 5 pills) Day 3-4 : Take 40 mg (4pills) Day 5-6: Take 30 mg (3 pills) Day 7-8:  Take 20 mg (2  pills) Day 9:  Take 10mg  (1 pill) (Patient not taking: Reported on 08/21/2017), Disp: 29 tablet, Rfl: 0  Review of Systems  Constitutional: Positive for fatigue. Negative for activity change.  Respiratory: Negative for apnea, cough, choking, chest tightness, shortness of breath, wheezing and stridor.   Cardiovascular: Negative for chest pain, palpitations and leg swelling.  Gastrointestinal: Negative for abdominal pain and nausea.  Musculoskeletal: Positive for back pain and myalgias.  Allergic/Immunologic: Negative for environmental allergies.  Neurological: Negative for dizziness and headaches.    Social History   Tobacco Use  . Smoking status: Never Smoker  . Smokeless tobacco: Never Used  Substance Use Topics  . Alcohol use: No   Objective:   BP (!) 148/82 (BP Location: Right Arm, Patient  Position: Sitting, Cuff Size: Normal)   Pulse 88   Temp 98.8 F (37.1 C)   Resp 16   Wt 174 lb (78.9 kg)   SpO2 95%   BMI 28.96 kg/m  Vitals:   08/21/17 0831  BP: (!) 148/82  Pulse: 88  Resp: 16  Temp: 98.8 F (37.1 C)  SpO2: 95%  Weight: 174 lb (78.9 kg)     Physical Exam  Constitutional: She appears well-developed and well-nourished. No distress.  HENT:  Head: Normocephalic and atraumatic.  Right Ear: Hearing, tympanic membrane, external ear and ear canal normal.  Left Ear: Hearing, tympanic membrane, external ear and ear canal normal.  Nose: Nose normal.  Mouth/Throat: Uvula is midline, oropharynx is clear and moist and mucous membranes are normal. No oropharyngeal exudate.  Eyes: Conjunctivae are normal. Pupils are equal, round, and reactive to light. Right eye exhibits no discharge. Left eye exhibits no discharge. No scleral icterus.  Neck: Normal range of motion. Neck supple. No tracheal deviation present. No thyromegaly present.  Cardiovascular: Normal rate, regular rhythm and normal heart sounds. Exam reveals no gallop and no friction rub.  No murmur heard. Pulmonary/Chest: Effort normal and breath sounds normal. No stridor. No respiratory distress. She has no wheezes. She has no rales.  Musculoskeletal:       Lumbar back: She exhibits tenderness and spasm (left paraspinal muscle group). She exhibits normal range of motion and no bony tenderness.  Lymphadenopathy:    She has no cervical adenopathy.  Skin: Skin is warm and dry. She is not diaphoretic.  Vitals reviewed.       Assessment & Plan:     1. Pneumonia of left lower lobe due to infectious organism [facility]) Feels much better, cough resolved completely. Will get CXR next week to check for clearance. I will f/u pending results. Patient is to call if symptoms return before next week.  - DG Chest 2 View; Future  2. Acute left-sided low back pain without sciatica Continue flexeril, tylenol prn. Moist heat prn.  Back exercises given to patient in written form.        Mar Daring, PA-C  [facility]

## 2017-09-04 ENCOUNTER — Emergency Department: Payer: Medicare HMO

## 2017-09-04 ENCOUNTER — Emergency Department
Admission: EM | Admit: 2017-09-04 | Discharge: 2017-09-04 | Disposition: A | Discharge status: Home / Self Care | Payer: Medicare HMO | Attending: Emergency Medicine | Admitting: Emergency Medicine

## 2017-09-04 ENCOUNTER — Encounter: Payer: Self-pay | Admitting: Emergency Medicine

## 2017-09-04 ENCOUNTER — Other Ambulatory Visit: Payer: Self-pay

## 2017-09-04 DIAGNOSIS — I10 Essential (primary) hypertension: Secondary | ICD-10-CM | POA: Diagnosis not present

## 2017-09-04 DIAGNOSIS — R059 Cough, unspecified: Secondary | ICD-10-CM

## 2017-09-04 DIAGNOSIS — E785 Hyperlipidemia, unspecified: Secondary | ICD-10-CM | POA: Insufficient documentation

## 2017-09-04 DIAGNOSIS — Z79899 Other long term (current) drug therapy: Secondary | ICD-10-CM | POA: Insufficient documentation

## 2017-09-04 DIAGNOSIS — R509 Fever, unspecified: Secondary | ICD-10-CM | POA: Diagnosis not present

## 2017-09-04 DIAGNOSIS — R05 Cough: Secondary | ICD-10-CM | POA: Diagnosis not present

## 2017-09-04 DIAGNOSIS — J101 Influenza due to other identified influenza virus with other respiratory manifestations: Secondary | ICD-10-CM | POA: Insufficient documentation

## 2017-09-04 DIAGNOSIS — Z8673 Personal history of transient ischemic attack (TIA), and cerebral infarction without residual deficits: Secondary | ICD-10-CM | POA: Insufficient documentation

## 2017-09-04 DIAGNOSIS — R42 Dizziness and giddiness: Secondary | ICD-10-CM | POA: Diagnosis not present

## 2017-09-04 DIAGNOSIS — R0602 Shortness of breath: Secondary | ICD-10-CM | POA: Diagnosis not present

## 2017-09-04 DIAGNOSIS — R079 Chest pain, unspecified: Secondary | ICD-10-CM | POA: Diagnosis not present

## 2017-09-04 LAB — BASIC METABOLIC PANEL
ANION GAP: 11 (ref 5–15)
BUN: 7 mg/dL (ref 6–20)
CO2: 28 mmol/L (ref 22–32)
Calcium: 9.1 mg/dL (ref 8.9–10.3)
Chloride: 97 mmol/L — ABNORMAL LOW (ref 101–111)
Creatinine, Ser: 0.75 mg/dL (ref 0.44–1.00)
Glucose, Bld: 126 mg/dL — ABNORMAL HIGH (ref 65–99)
Potassium: 3.2 mmol/L — ABNORMAL LOW (ref 3.5–5.1)
SODIUM: 136 mmol/L (ref 135–145)

## 2017-09-04 LAB — CBC
HEMATOCRIT: 40.7 % (ref 35.0–47.0)
Hemoglobin: 13.7 g/dL (ref 12.0–16.0)
MCH: 27.7 pg (ref 26.0–34.0)
MCHC: 33.7 g/dL (ref 32.0–36.0)
MCV: 82.3 fL (ref 80.0–100.0)
PLATELETS: 323 10*3/uL (ref 150–440)
RBC: 4.95 MIL/uL (ref 3.80–5.20)
RDW: 14.1 % (ref 11.5–14.5)
WBC: 5.8 10*3/uL (ref 3.6–11.0)

## 2017-09-04 LAB — INFLUENZA PANEL BY PCR (TYPE A & B)
Influenza A By PCR: POSITIVE — AB
Influenza B By PCR: NEGATIVE

## 2017-09-04 LAB — TROPONIN I

## 2017-09-04 MED ORDER — ONDANSETRON 4 MG PO TBDP: 4.0000 mg | ORAL_TABLET | Freq: Three times a day (TID) | ORAL | 0 refills | Status: DC | PRN

## 2017-09-04 MED ORDER — SODIUM CHLORIDE 0.9 % IV BOLUS (SEPSIS)
1000.0000 mL | Freq: Once | INTRAVENOUS | Status: AC
  Administered 2017-09-04: 1000 mL via INTRAVENOUS

## 2017-09-04 MED ORDER — OSELTAMIVIR PHOSPHATE 75 MG PO CAPS: 75.0000 mg | ORAL_CAPSULE | Freq: Two times a day (BID) | ORAL | 0 refills | Status: AC

## 2017-09-04 MED ORDER — OSELTAMIVIR PHOSPHATE 75 MG PO CAPS
75.0000 mg | ORAL_CAPSULE | Freq: Once | ORAL | Status: AC
  Administered 2017-09-04: 75 mg via ORAL
  Filled 2017-09-04: qty 1

## 2017-09-04 MED ORDER — IBUPROFEN 600 MG PO TABS
600.0000 mg | ORAL_TABLET | Freq: Once | ORAL | Status: DC
  Filled 2017-09-04: qty 1

## 2017-09-04 MED ORDER — ACETAMINOPHEN 500 MG PO TABS
1000.0000 mg | ORAL_TABLET | Freq: Once | ORAL | Status: AC
  Administered 2017-09-04: 1000 mg via ORAL
  Filled 2017-09-04: qty 2

## 2017-09-04 NOTE — ED Notes (Signed)
Pt given another warm blanket, says she's feeling cold; thermostat adjusted in room;

## 2017-09-04 NOTE — ED Provider Notes (Signed)
[facility] Emergency Department Provider Note   ____________________________________________   First MD Initiated Contact with Patient 09/04/17 [phone]     (approximate)  I have reviewed the triage vital signs and the nursing notes.   HISTORY  Chief Complaint Fever; Cough; and Generalized Body Aches    HPI [name] is a 62 y.o. female who comes into the hospital today with some fever aches nausea and dizziness.  The patient reports that she started having symptoms on Saturday.  She states that her great grandchildren have both been sick with the flu.  The patient did not check her temperature the first day but reports that the second day her temperature was 100.8.  She felt hot to the touch and her finger was painful.  The patient has been taking Tylenol since she is not allowed to take ibuprofen.  She reports that she took some Tylenol last night before she fell asleep and then woke up feeling bad so she decided to come and get checked out.  The patient has had a cough and some chest pain but denies any sore throat.  She reports that she has been coughing up some whitish appearing mucus.  She has had nausea with no vomiting and she has been feeling dizzy.  The patient has been eating and drinking well.  She is here today for evaluation.   Past Medical History:  Diagnosis Date  . Allergy   . Anemia    takes Ferrous Sulfate daily  . Arthritis   . GERD (gastroesophageal reflux disease)    takes OTC med  . Hyperlipidemia   . Hypertension    takes Amlodipine daily  . Low back pain    occasionally    Patient Active Problem List   Diagnosis Date Noted  . CVA (cerebral vascular accident) ([place]) 03/14/2017  . Gastritis   . BPPV (benign paroxysmal positional vertigo) 03/24/2015  . Right hip pain 01/07/2015  . Absolute anemia 01/06/2015  . BMI 29.0-29.9,adult 01/06/2015  . Cervical disc disease 01/06/2015  . Atypical chest pain 01/06/2015  .  Generalized pruritus 01/06/2015  . Acid reflux 01/06/2015  . Cervicogenic headache 01/06/2015  . Hypercholesteremia 01/06/2015  . High potassium 01/06/2015  . Low back pain 01/06/2015  . Pseudoarthrosis of cervical spine ([place]) 08/26/2013  . Accumulation of fluid in tissues 12/24/2009  . Avitaminosis D 10/15/2009  . Cardiac conduction disorder 09/30/2008  . Essential (primary) hypertension 09/30/2008  . Blood in the urine 09/30/2008    Past Surgical History:  Procedure Laterality Date  . ANTERIOR CERVICAL DECOMP/DISCECTOMY FUSION  10/19/2011   Procedure: ANTERIOR CERVICAL DECOMPRESSION/DISCECTOMY FUSION 1 LEVEL/HARDWARE REMOVAL;  Surgeon: Elaina Hoops, MD;  Location: [facility];  Service: Neurosurgery;  Laterality: N/A;  Cervical four - five  Anterior cervical decompression fusion with redo at six - seven  Rm 33  . COLONOSCOPY  08/2014   polyps  . ESOPHAGOGASTRODUODENOSCOPY (EGD) WITH PROPOFOL N/A 06/23/2015   Procedure: ESOPHAGOGASTRODUODENOSCOPY (EGD) WITH PROPOFOL;  Surgeon: [name], MD;  Location: [facility] ENDOSCOPY;  Service: Endoscopy;  Laterality: N/A;  . NECK SURGERY    . POSTERIOR CERVICAL FUSION/FORAMINOTOMY N/A 08/26/2013   Procedure: C/4-5,C/5-6,C/6-7 Posterior Cervical Fusion w/lateral mass fixation;  Surgeon: Elaina Hoops, MD;  Location: [place] [facility];  Service: Neurosurgery;  Laterality: N/A;  . TUBAL LIGATION      Prior to Admission medications   Medication Sig Start Date End Date Taking? Authorizing Provider  acetaminophen (TYLENOL) 500 MG tablet  Take 2 tablets (1,000 mg total) by mouth every 6 (six) hours as needed. 03/22/17  Yes Mar Daring, PA-C  albuterol (PROVENTIL HFA;VENTOLIN HFA) 108 (90 Base) MCG/ACT inhaler Inhale 2 puffs into the lungs every 6 (six) hours as needed for wheezing or shortness of breath. 08/04/17  Yes Merlyn Lot, MD  amLODipine (NORVASC) 10 MG tablet Take 1 tablet (10 mg total) by mouth daily. 10/25/16  Yes Mar Daring, PA-C    atorvastatin (LIPITOR) 40 MG tablet Take 1 tablet (40 mg total) by mouth daily. 04/17/17 04/17/18 Yes Burnette, Clearnce Sorrel, PA-C  Cholecalciferol (VITAMIN D) 2000 UNITS tablet Take 2,000 Units by mouth daily.   Yes [provider]  clopidogrel (PLAVIX) 75 MG tablet TAKE 1 TABLET BY MOUTH ONCE DAILY 05/22/17  Yes Fenton Malling M, PA-C  cyclobenzaprine (FLEXERIL) 5 MG tablet Take 1 tablet (5 mg total) by mouth 3 (three) times daily as needed for muscle spasms. 08/17/17  Yes Mar Daring, PA-C  DEXILANT 60 MG capsule TAKE 1 CAPSULE BY MOUTH ONCE DAILY 05/22/17  Yes Mar Daring, PA-C  ferrous sulfate 325 (65 FE) MG tablet Take 325 mg by mouth daily with breakfast.   Yes [provider]  fluticasone (FLONASE) 50 MCG/ACT nasal spray Place 2 sprays into both nostrils daily. Patient taking differently: Place 2 sprays into both nostrils daily as needed.  10/20/16  Yes Chauvin, Herbie [place], PA  Omega-3 Fatty Acids (FISH OIL) 1200 MG CAPS Take 1 capsule by mouth daily.   Yes [provider]  Potassium 75 MG TABS Take by mouth.   Yes [provider]  sucralfate (CARAFATE) 1 g tablet Take 1 tablet (1 g total) by mouth 4 (four) times daily -  with meals and at bedtime. 12/07/16  Yes Mar Daring, PA-C  ondansetron (ZOFRAN ODT) 4 MG disintegrating tablet Take 1 tablet (4 mg total) by mouth every 8 (eight) hours as needed for nausea or vomiting. 09/04/17   Loney Hering, MD  oseltamivir (TAMIFLU) 75 MG capsule Take 1 capsule (75 mg total) by mouth 2 (two) times daily for 5 days. 09/04/17 09/09/17  Loney Hering, MD    Allergies Aspirin; Hydrochlorothiazide; Ibuprofen; Lisinopril; and Sulfa antibiotics  Family History  Problem Relation Age of Onset  . Hypertension Mother   . Heart disease Mother   . Breast cancer Neg Hx     Social History Social History   Tobacco Use  . Smoking status: Never Smoker  . Smokeless tobacco: Never Used   Substance Use Topics  . Alcohol use: No  . Drug use: No    Review of Systems  Constitutional:  fever/chills Eyes: No visual changes. ENT: No sore throat. Cardiovascular:  chest pain. Respiratory: cough and shortness of breath. Gastrointestinal: Nausea, No abdominal pain. no vomiting.  No diarrhea.  No constipation. Genitourinary: Negative for dysuria. Musculoskeletal: muscle aches Skin: Negative for rash. Neurological: dizziness   ____________________________________________   PHYSICAL EXAM:  VITAL SIGNS: ED Triage Vitals  Enc Vitals Group     BP 09/04/17 0526 128/90     Pulse Rate 09/04/17 0526 (!) 101     Resp 09/04/17 0526 19     Temp 09/04/17 0526 99.9 F (37.7 C)     Temp Source 09/04/17 0526 Oral     SpO2 09/04/17 0530 93 %     Weight 09/04/17 0527 174 lb (78.9 kg)     Height 09/04/17 0527 5\' 5"  (1.651 m)  Head Circumference --      Peak Flow --      Pain Score 09/04/17 0527 9     Pain Loc --      Pain Edu? --      Excl. in [place]? --     Constitutional: Alert and oriented. Well appearing and in moderate distress. Eyes: Conjunctivae are normal. PERRL. EOMI. Head: Atraumatic. Nose: No congestion/rhinnorhea. Mouth/Throat: Mucous membranes are moist.  Oropharynx non-erythematous. Cardiovascular: Normal rate, regular rhythm. Grossly normal heart sounds.  Good peripheral circulation. Respiratory: Normal respiratory effort.  No retractions. Lungs CTAB. Gastrointestinal: Soft and nontender. No distention. Positive bowel sounds Musculoskeletal: No lower extremity tenderness nor edema.  Neurologic:  Normal speech and language.  Skin:  Skin is warm, dry and intact.  Psychiatric: Mood and affect are normal.   ____________________________________________   LABS (all labs ordered are listed, but only abnormal results are displayed)  Labs Reviewed  INFLUENZA PANEL BY PCR (TYPE A & B) - Abnormal; Notable for the following components:      Result Value    Influenza A By PCR POSITIVE (*)    All other components within normal limits  BASIC METABOLIC PANEL - Abnormal; Notable for the following components:   Potassium 3.2 (*)    Chloride 97 (*)    Glucose, Bld 126 (*)    All other components within normal limits  CBC  TROPONIN I   ____________________________________________  EKG  ED ECG REPORT I, Loney Hering, the attending physician, personally viewed and interpreted this ECG.   Date: 09/04/2017  EKG Time: 0605  Rate: 84  Rhythm: normal sinus rhythm  Axis: normal  Intervals:none  ST&T Change: none  ED ECG REPORT #2 I, Loney Hering, the attending physician, personally viewed and interpreted this ECG.   Date: 09/04/2017  EKG Time: 633  Rate: 84  Rhythm: normal sinus rhythm  Axis: normal  Intervals:none  ST&T Change: none   ____________________________________________  RADIOLOGY  ED MD interpretation:  CXR: No pneumonia  Official radiology report(s): Dg Chest 2 View  Result Date: 09/04/2017 CLINICAL DATA:  Acute onset of generalized chest tightness and productive cough. EXAM: CHEST  2 VIEW COMPARISON:  Chest radiograph performed 08/11/2017 FINDINGS: The lungs are hypoexpanded, with mild bibasilar atelectasis. No pleural effusion or pneumothorax is seen. The heart is normal in size. No acute osseous abnormalities are identified. Cervical spinal fusion hardware is partially imaged. IMPRESSION: Lungs hypoexpanded, with mild bibasilar atelectasis. Electronically Signed   By: Garald Balding M.D.   On: 09/04/2017 06:39    ____________________________________________   PROCEDURES  Procedure(s) performed: None  Procedures  Critical Care performed: No  ____________________________________________   INITIAL IMPRESSION / ASSESSMENT AND PLAN / ED COURSE  As part of my medical decision making, I reviewed the following data within the electronic MEDICAL RECORD NUMBER Notes from prior ED visits and [place] Controlled  Substance Database   This is a 62 year old female who comes into the hospital today with some fever aches nausea and dizziness.  The patient has been exposed to influenza.  My differential diagnosis includes influenza, pneumonia, upper respiratory infection.  The patient had some blood work drawn which was unremarkable.  The patient's flu was positive and the patient's chest x-ray did not show a pneumonia.  The patient will receive a dose of Tamiflu as well as a liter of normal saline.  The patient will be discharged home to follow-up with her primary care physician.  ____________________________________________   FINAL CLINICAL IMPRESSION(S) / ED DIAGNOSES  Final diagnoses:  Influenza A  Cough     ED Discharge Orders        Ordered    oseltamivir (TAMIFLU) 75 MG capsule  2 times daily     09/04/17 0824    ondansetron (ZOFRAN ODT) 4 MG disintegrating tablet  Every 8 hours PRN     09/04/17 0824       Note:  This document was prepared using Dragon voice recognition software and may include unintentional dictation errors.    Loney Hering, MD 09/04/17 [phone]

## 2017-09-04 NOTE — ED Notes (Signed)
Patient transported to X-ray via Biomedical scientist with [employer]

## 2017-09-04 NOTE — Discharge Instructions (Signed)
Please follow up with your primary care physician for further evaluation of your pneumonia

## 2017-09-04 NOTE — ED Notes (Signed)
Pt back from xray; up to toilet in room to void; ambulatory with steady gait; pt says she cannot take Ibuprofen per her MD because of the blood thinner she's on; will discuss with MD;

## 2017-09-04 NOTE — ED Triage Notes (Addendum)
Pt arrived via EMS from home with c/o flu like symptoms since Saturday; fever, 100.8 yesterday; took tylenol at 7pm; productive cough of thick yellow mucus; reports chest tightness; pt reports finishing antibiotics for pneumonia about 1 week ago; pt has 2 grandchildren that live in her house that have been diagnosed with the flu; pt talking in complete coherent sentences; pt says she was able to sleep last night but woke this am feeling worse;

## 2017-09-06 ENCOUNTER — Ambulatory Visit (INDEPENDENT_AMBULATORY_CARE_PROVIDER_SITE_OTHER): Payer: Medicare HMO | Admitting: Physician Assistant

## 2017-09-06 ENCOUNTER — Encounter: Payer: Self-pay | Admitting: Physician Assistant

## 2017-09-06 VITALS — BP 130/80 | HR 71 | Temp 98.3°F | Resp 16 | Ht 65.0 in | Wt 173.0 lb

## 2017-09-06 DIAGNOSIS — R059 Cough, unspecified: Secondary | ICD-10-CM

## 2017-09-06 DIAGNOSIS — J101 Influenza due to other identified influenza virus with other respiratory manifestations: Secondary | ICD-10-CM

## 2017-09-06 DIAGNOSIS — R05 Cough: Secondary | ICD-10-CM

## 2017-09-06 MED ORDER — HYDROCODONE-HOMATROPINE 5-1.5 MG/5ML PO SYRP
5.0000 mL | ORAL_SOLUTION | Freq: Three times a day (TID) | ORAL | 0 refills | Status: DC | PRN
Start: 2017-09-06 — End: 2017-10-20

## 2017-09-06 NOTE — Patient Instructions (Signed)
Continue Albuterol inhaler Finish tamiflu as prescribed Push fluids Zofran as needed Ibuprofen or tylenol as needed alternating every 3 hours for fevers and body aches Cough syrup at bedtime for nighttime cough

## 2017-09-06 NOTE — Progress Notes (Signed)
Patient: [name] Female    DOB: [birth year]   62 y.o.   MRN: [id number] Visit Date: 09/06/2017  Today's Provider: Mar Daring, PA-C   Chief Complaint  Patient presents with  . Follow-up   Subjective:    HPI  Follow up ER visit  Patient was seen in ER for fever, cough and body aches on 09/04/2017. She was treated for Flu. Treatment for this included Tamiflu and Zofran. She reports excellent compliance with treatment. She reports this condition is Improved. Patient reports body aches have improved, reports that cough is still about the same. ------------------------------------------------------------------------------------     Allergies  Allergen Reactions  . Aspirin Nausea Only  . Hydrochlorothiazide     Rash  . Ibuprofen Other (See Comments)    Pt says her doctor doesn't want her taking this with the blood thinner she's taking  . Lisinopril     Other reaction(s): Unknown  . Sulfa Antibiotics Hives and Nausea Only     Current Outpatient Medications:  .  acetaminophen (TYLENOL) 500 MG tablet, Take 2 tablets (1,000 mg total) by mouth every 6 (six) hours as needed., Disp: 90 tablet, Rfl: 1 .  albuterol (PROVENTIL HFA;VENTOLIN HFA) 108 (90 Base) MCG/ACT inhaler, Inhale 2 puffs into the lungs every 6 (six) hours as needed for wheezing or shortness of breath., Disp: 1 Inhaler, Rfl: 2 .  amLODipine (NORVASC) 10 MG tablet, Take 1 tablet (10 mg total) by mouth daily., Disp: 30 tablet, Rfl: 6 .  atorvastatin (LIPITOR) 40 MG tablet, Take 1 tablet (40 mg total) by mouth daily., Disp: 90 tablet, Rfl: 1 .  Cholecalciferol (VITAMIN D) 2000 UNITS tablet, Take 2,000 Units by mouth daily., Disp: , Rfl:  .  clopidogrel (PLAVIX) 75 MG tablet, TAKE 1 TABLET BY MOUTH ONCE DAILY, Disp: 90 tablet, Rfl: 1 .  DEXILANT 60 MG capsule, TAKE 1 CAPSULE BY MOUTH ONCE DAILY, Disp: 90 capsule, Rfl: 1 .  ferrous sulfate 325 (65 FE) MG tablet, Take 325 mg by mouth daily with  breakfast., Disp: , Rfl:  .  fluticasone (FLONASE) 50 MCG/ACT nasal spray, Place 2 sprays into both nostrils daily. (Patient taking differently: Place 2 sprays into both nostrils daily as needed. ), Disp: 16 g, Rfl: 5 .  Omega-3 Fatty Acids (FISH OIL) 1200 MG CAPS, Take 1 capsule by mouth daily., Disp: , Rfl:  .  ondansetron (ZOFRAN ODT) 4 MG disintegrating tablet, Take 1 tablet (4 mg total) by mouth every 8 (eight) hours as needed for nausea or vomiting., Disp: 20 tablet, Rfl: 0 .  oseltamivir (TAMIFLU) 75 MG capsule, Take 1 capsule (75 mg total) by mouth 2 (two) times daily for 5 days., Disp: 10 capsule, Rfl: 0 .  Potassium 75 MG TABS, Take by mouth., Disp: , Rfl:  .  sucralfate (CARAFATE) 1 g tablet, Take 1 tablet (1 g total) by mouth 4 (four) times daily -  with meals and at bedtime., Disp: 120 tablet, Rfl: 5  Review of Systems  Constitutional: Negative.   HENT: Positive for congestion.   Respiratory: Positive for cough and wheezing.   Cardiovascular: Negative.   Gastrointestinal: Negative.   Neurological: Negative.     Social History   Tobacco Use  . Smoking status: Never Smoker  . Smokeless tobacco: Never Used  Substance Use Topics  . Alcohol use: No   Objective:   BP 130/80 (BP Location: Left Arm, Patient Position: Sitting, Cuff Size: Normal)   Pulse 71  Temp 98.3 F (36.8 C)   Resp 16   Ht 5\' 5"  (1.651 m)   Wt 173 lb (78.5 kg)   SpO2 96%   BMI 28.79 kg/m  Vitals:   09/06/17 1107  BP: 130/80  Pulse: 71  Resp: 16  Temp: 98.3 F (36.8 C)  SpO2: 96%  Weight: 173 lb (78.5 kg)  Height: 5\' 5"  (1.651 m)     Physical Exam  Constitutional: She appears well-developed and well-nourished. No distress.  HENT:  Head: Normocephalic and atraumatic.  Right Ear: Hearing, tympanic membrane, external ear and ear canal normal.  Left Ear: Hearing, tympanic membrane, external ear and ear canal normal.  Nose: Nose normal.  Mouth/Throat: Uvula is midline, oropharynx is clear  and moist and mucous membranes are normal. No oropharyngeal exudate.  Eyes: Conjunctivae are normal. Pupils are equal, round, and reactive to light. Right eye exhibits no discharge. Left eye exhibits no discharge. No scleral icterus.  Neck: Normal range of motion. Neck supple. No tracheal deviation present. No thyromegaly present.  Cardiovascular: Normal rate, regular rhythm and normal heart sounds. Exam reveals no gallop and no friction rub.  No murmur heard. Pulmonary/Chest: Effort normal and breath sounds normal. No stridor. No respiratory distress. She has no wheezes. She has no rales.  Lymphadenopathy:    She has no cervical adenopathy.  Skin: Skin is warm and dry. She is not diaphoretic.  Vitals reviewed.       Assessment & Plan:     1. Influenza A Continue Tamiflu as prescribed. Tylenol and IBU every 3 hours alternating prn for fevers and body aches. Will add hycodan cough syrup for nighttime cough. She is to call if symptoms worsen or fail to improve.  - HYDROcodone-homatropine (HYCODAN) 5-1.5 MG/5ML syrup; Take 5 mLs by mouth every 8 (eight) hours as needed for cough.  Dispense: 120 mL; Refill: 0  2. Cough Worsening symptoms that has not responded to OTC medications. Will give Hycodan cough syrup as below for nighttime cough. Drowsiness precautions given to patient. Stay well hydrated. Use delsym, robitussin OR mucinex for daytime cough. - HYDROcodone-homatropine (HYCODAN) 5-1.5 MG/5ML syrup; Take 5 mLs by mouth every 8 (eight) hours as needed for cough.  Dispense: 120 mL; Refill: 0       Mar Daring, PA-C  [facility]

## 2017-09-28 ENCOUNTER — Other Ambulatory Visit: Payer: Self-pay | Admitting: Physician Assistant

## 2017-09-28 DIAGNOSIS — K219 Gastro-esophageal reflux disease without esophagitis: Secondary | ICD-10-CM

## 2017-09-28 DIAGNOSIS — J301 Allergic rhinitis due to pollen: Secondary | ICD-10-CM

## 2017-09-28 MED ORDER — FLUTICASONE PROPIONATE 50 MCG/ACT NA SUSP: 2.0000 | Freq: Every day | NASAL | 5 refills | Status: DC

## 2017-09-28 MED ORDER — DEXLANSOPRAZOLE 60 MG PO CPDR: 1.0000 | DELAYED_RELEASE_CAPSULE | Freq: Every day | ORAL | 1 refills | Status: DC

## 2017-09-28 NOTE — Telephone Encounter (Signed)
Pt requesting Dexilant 60 MG and Flonase sent to [facility] on [address]

## 2017-10-20 ENCOUNTER — Encounter: Payer: Self-pay | Admitting: Physician Assistant

## 2017-10-20 ENCOUNTER — Ambulatory Visit (INDEPENDENT_AMBULATORY_CARE_PROVIDER_SITE_OTHER): Payer: Medicare HMO | Admitting: Physician Assistant

## 2017-10-20 VITALS — BP 118/70 | HR 65 | Temp 98.0°F | Resp 16 | Wt 175.8 lb

## 2017-10-20 DIAGNOSIS — J301 Allergic rhinitis due to pollen: Secondary | ICD-10-CM | POA: Diagnosis not present

## 2017-10-20 MED ORDER — LORATADINE 10 MG PO TABS: 10.0000 mg | ORAL_TABLET | Freq: Every day | ORAL | 3 refills | Status: DC

## 2017-10-20 NOTE — Progress Notes (Signed)
Patient: [name] Female    DOB: 1956/04/01   62 y.o.   MRN: [id number] Visit Date: 10/20/2017  Today's Provider: Mar Daring, PA-C   Chief Complaint  Patient presents with  . Headache   Subjective:    Headache   This is a new problem. The current episode started 1 to 4 weeks ago. The problem occurs constantly. The pain is located in the temporal and frontal region. Radiates to: Back of her neck. The pain quality is not similar to prior headaches. The quality of the pain is described as throbbing and aching ("lots pressure"). Pain scale: Depends-sometimes is like a 10/10. Associated symptoms include coughing, insomnia, nausea, neck pain and sinus pressure. Pertinent negatives include no back pain, blurred vision, ear pain, eye pain, facial sweating, fever, numbness, rhinorrhea, sore throat or visual change. Exacerbated by: Laying down. She has tried acetaminophen (Zofran,Flonase, and put alcohol on her forehead) for the symptoms. The treatment provided no relief. Her past medical history is significant for migraines in the family. There is no history of migraine headaches.      Allergies  Allergen Reactions  . Aspirin Nausea Only  . Hydrochlorothiazide     Rash  . Ibuprofen Other (See Comments)    Pt says her doctor doesn't want her taking this with the blood thinner she's taking  . Lisinopril     Other reaction(s): Unknown  . Sulfa Antibiotics Hives and Nausea Only     Current Outpatient Medications:  .  acetaminophen (TYLENOL) 500 MG tablet, Take 2 tablets (1,000 mg total) by mouth every 6 (six) hours as needed., Disp: 90 tablet, Rfl: 1 .  albuterol (PROVENTIL HFA;VENTOLIN HFA) 108 (90 Base) MCG/ACT inhaler, Inhale 2 puffs into the lungs every 6 (six) hours as needed for wheezing or shortness of breath., Disp: 1 Inhaler, Rfl: 2 .  amLODipine (NORVASC) 10 MG tablet, Take 1 tablet (10 mg total) by mouth daily., Disp: 30 tablet, Rfl: 6 .  atorvastatin (LIPITOR)  40 MG tablet, Take 1 tablet (40 mg total) by mouth daily., Disp: 90 tablet, Rfl: 1 .  Cholecalciferol (VITAMIN D) 2000 UNITS tablet, Take 2,000 Units by mouth daily., Disp: , Rfl:  .  dexlansoprazole (DEXILANT) 60 MG capsule, Take 1 capsule (60 mg total) by mouth daily., Disp: 90 capsule, Rfl: 1 .  ferrous sulfate 325 (65 FE) MG tablet, Take 325 mg by mouth daily with breakfast., Disp: , Rfl:  .  fluticasone (FLONASE) 50 MCG/ACT nasal spray, Place 2 sprays into both nostrils daily., Disp: 16 g, Rfl: 5 .  Omega-3 Fatty Acids (FISH OIL) 1200 MG CAPS, Take 1 capsule by mouth daily., Disp: , Rfl:  .  ondansetron (ZOFRAN ODT) 4 MG disintegrating tablet, Take 1 tablet (4 mg total) by mouth every 8 (eight) hours as needed for nausea or vomiting., Disp: 20 tablet, Rfl: 0 .  Potassium 75 MG TABS, Take by mouth., Disp: , Rfl:  .  sucralfate (CARAFATE) 1 g tablet, Take 1 tablet (1 g total) by mouth 4 (four) times daily -  with meals and at bedtime., Disp: 120 tablet, Rfl: 5 .  clopidogrel (PLAVIX) 75 MG tablet, TAKE 1 TABLET BY MOUTH ONCE DAILY, Disp: 90 tablet, Rfl: 1  Review of Systems  Constitutional: Negative for fever.  HENT: Positive for sinus pressure. Negative for ear pain, rhinorrhea and sore throat.   Eyes: Negative for blurred vision and pain.  Respiratory: Positive for cough.   Gastrointestinal:  Positive for nausea.  Musculoskeletal: Positive for neck pain. Negative for back pain.  Neurological: Positive for headaches. Negative for numbness.  Psychiatric/Behavioral: The patient has insomnia.     Social History   Tobacco Use  . Smoking status: Never Smoker  . Smokeless tobacco: Never Used  Substance Use Topics  . Alcohol use: No   Objective:   BP 118/70 (BP Location: Left Arm, Patient Position: Sitting, Cuff Size: Normal)   Pulse 65   Temp 98 F (36.7 C) (Oral)   Resp 16   Wt 175 lb 12.8 oz (79.7 kg)   SpO2 99%   BMI 29.25 kg/m    Physical Exam  Constitutional: She is  oriented to person, place, and time. She appears well-developed and well-nourished. No distress.  HENT:  Head: Normocephalic and atraumatic.  Right Ear: Hearing, tympanic membrane, external ear and ear canal normal.  Left Ear: Hearing, tympanic membrane, external ear and ear canal normal.  Nose: Rhinorrhea present. No mucosal edema. Right sinus exhibits no maxillary sinus tenderness and no frontal sinus tenderness. Left sinus exhibits no maxillary sinus tenderness and no frontal sinus tenderness.  Mouth/Throat: Uvula is midline, oropharynx is clear and moist and mucous membranes are normal. No oropharyngeal exudate.  Eyes: Pupils are equal, round, and reactive to light. Conjunctivae and EOM are normal.  Neck: Normal range of motion. Neck supple. No tracheal deviation present. No thyromegaly present.  Cardiovascular: Normal rate, regular rhythm and normal heart sounds. Exam reveals no gallop and no friction rub.  No murmur heard. Pulmonary/Chest: Effort normal and breath sounds normal. No stridor. No respiratory distress. She has no wheezes. She has no rales.  Lymphadenopathy:    She has no cervical adenopathy.  Neurological: She is alert and oriented to person, place, and time. She has normal strength. No cranial nerve deficit or sensory deficit. She displays a negative Romberg sign. Coordination and gait normal.  Skin: She is not diaphoretic.  Vitals reviewed.      Assessment & Plan:     1. Seasonal allergic rhinitis due to pollen Will start claritin. Hold flonase due to nasal irritation currently. May use Mucinex or Coricidin HBP for congestion. Saline nasal irrigations encouraged. She is to call if symptoms worsen.  - loratadine (CLARITIN) 10 MG tablet; Take 1 tablet (10 mg total) by mouth daily.  Dispense: 30 tablet; Refill: [place], PA-C  [facility]

## 2017-10-20 NOTE — Patient Instructions (Signed)
Start Loratadine Hold flonase due to irritation Start netti pot (saline nasal washes) Mucinex (plain) or Coricidin HBP if needed for congestion   Allergies, Adult An allergy is when your body's defense system (immune system) overreacts to an otherwise harmless substance (allergen) that you breathe in or eat or something that touches your skin. When you come into contact with something that you are allergic to, your immune system produces certain proteins (antibodies). These proteins cause cells to release chemicals (histamines) that trigger the symptoms of an allergic reaction. Allergies often affect the nasal passages (allergic rhinitis), eyes (allergic conjunctivitis), skin (atopic dermatitis), and stomach. Allergies can be mild or severe. Allergies cannot spread from person to person (are not contagious). They can develop at any age and may be outgrown. What increases the risk? You may be at greater risk of allergies if other people in your family have allergies. What are the signs or symptoms? Symptoms depend on what type of allergy you have. They may include:  Runny, stuffy nose.  Sneezing.  Itchy mouth, ears, or throat.  Postnasal drip.  Sore throat.  Itchy, red, watery, or puffy eyes.  Skin rash or hives.  Stomach pain.  Vomiting.  Diarrhea.  Bloating.  Wheezing or coughing.  People with a severe allergy to food, medicine, or an insect bite may have a life-threatening allergic reaction (anaphylaxis). Symptoms of anaphylaxis include:  Hives.  Itching.  Flushed face.  Swollen lips, tongue, or mouth.  Tight or swollen throat.  Chest pain or tightness in the chest.  Trouble breathing or shortness of breath.  Rapid heartbeat.  Dizziness or fainting.  Vomiting.  Diarrhea.  Pain in the abdomen.  How is this diagnosed? This condition is diagnosed based on:  Your symptoms.  Your family and medical history.  A physical exam.  You may need to see a  health care provider who specializes in treating allergies (allergist). You may also have tests, including:  Skin tests to see which allergens are causing your symptoms, such as: ? Skin prick test. In this test, your skin is pricked with a tiny needle and exposed to small amounts of possible allergens to see if your skin reacts. ? Intradermal skin test. In this test, a small amount of allergen is injected under your skin to see if your skin reacts. ? Patch test. In this test, a small amount of allergen is placed on your skin and then your skin is covered with a bandage. Your health care provider will check your skin after a couple of days to see if a rash has developed.  Blood tests.  Challenges tests. In this test, you inhale a small amount of allergen by mouth to see if you have an allergic reaction.  You may also be asked to:  Keep a food diary. A food diary is a record of all the foods and drinks you have in a day and any symptoms you experience.  Practice an elimination diet. An elimination diet involves eliminating specific foods from your diet and then adding them back in one by one to find out if a certain food causes an allergic reaction.  How is this treated? Treatment for allergies depends on your symptoms. Treatment may include:  Cold compresses to soothe itching and swelling.  Eye drops.  Nasal sprays.  Using a saline spray or container (neti pot) to flush out the nose (nasal irrigation). These methods can help clear away mucus and keep the nasal passages moist.  Using a humidifier.  Oral antihistamines or other medicines to block allergic reaction and inflammation.  Skin creams to treat rashes or itching.  Diet changes to eliminate food allergy triggers.  Repeated exposure to tiny amounts of allergens to build up a tolerance and prevent future allergic reactions (immunotherapy). These include: ? Allergy shots. ? Oral treatment. This involves taking small doses of  an allergen under the tongue (sublingual immunotherapy).  Emergency epinephrine injection (auto-injector) in case of an allergic emergency. This is a self-injectable, pre-measured medicine that must be given within the first few minutes of a serious allergic reaction.  Follow these instructions at home:  Avoid known allergens whenever possible.  If you suffer from airborne allergens, wash out your nose daily. You can do this with a saline spray or a neti pot to flush out your nose (nasal irrigation).  Take over-the-counter and prescription medicines only as told by your health care provider.  Keep all follow-up visits as told by your health care provider. This is important.  If you are at risk of a severe allergic reaction (anaphylaxis), keep your auto-injector with you at all times.  If you have ever had anaphylaxis, wear a medical alert bracelet or necklace that states you have a severe allergy. Contact a health care provider if:  Your symptoms do not improve with treatment. Get help right away if:  You have symptoms of anaphylaxis, such as: ? Swollen mouth, tongue, or throat. ? Pain or tightness in your chest. ? Trouble breathing or shortness of breath. ? Dizziness or fainting. ? Severe abdominal pain, vomiting, or diarrhea. This information is not intended to replace advice given to you by your health care provider. Make sure you discuss any questions you have with your health care provider. Document Released: 10/04/2002 Document Revised: 11/09/2016 Document Reviewed: 01/27/2016 Elsevier Interactive Patient Education  [employer].

## 2017-11-16 ENCOUNTER — Other Ambulatory Visit: Payer: Self-pay | Admitting: Physician Assistant

## 2017-11-16 ENCOUNTER — Telehealth: Payer: Self-pay

## 2017-11-16 DIAGNOSIS — I10 Essential (primary) hypertension: Secondary | ICD-10-CM

## 2017-11-16 MED ORDER — AMLODIPINE BESYLATE 10 MG PO TABS: 10.0000 mg | ORAL_TABLET | Freq: Every day | ORAL | 6 refills | Status: DC

## 2017-11-16 NOTE — Telephone Encounter (Signed)
Please review. Thanks!  

## 2017-11-16 NOTE — Telephone Encounter (Signed)
Pt needs refill on her Amlodipine 10mg   [facility] [place]  [employer]

## 2017-11-16 NOTE — Telephone Encounter (Signed)
LMTCB to schedule AWV and CPE after 12/07/17. -MM

## 2017-11-20 ENCOUNTER — Other Ambulatory Visit: Payer: Self-pay | Admitting: Physician Assistant

## 2017-11-20 DIAGNOSIS — E7800 Pure hypercholesterolemia, unspecified: Secondary | ICD-10-CM

## 2017-11-20 DIAGNOSIS — Z8673 Personal history of transient ischemic attack (TIA), and cerebral infarction without residual deficits: Secondary | ICD-10-CM

## 2017-11-20 NOTE — Telephone Encounter (Signed)
Pt scheduled for 12/25/17

## 2017-11-30 NOTE — Telephone Encounter (Signed)
Noted, thank you

## 2017-12-25 ENCOUNTER — Encounter: Payer: Self-pay | Admitting: Physician Assistant

## 2017-12-25 ENCOUNTER — Ambulatory Visit (INDEPENDENT_AMBULATORY_CARE_PROVIDER_SITE_OTHER): Payer: Medicare HMO | Admitting: Physician Assistant

## 2017-12-25 ENCOUNTER — Ambulatory Visit: Payer: Medicare HMO

## 2017-12-25 VITALS — BP 120/70 | HR 75 | Temp 97.9°F | Resp 16 | Ht 65.0 in | Wt 172.6 lb

## 2017-12-25 DIAGNOSIS — S129XXD Fracture of neck, unspecified, subsequent encounter: Secondary | ICD-10-CM | POA: Diagnosis not present

## 2017-12-25 DIAGNOSIS — I10 Essential (primary) hypertension: Secondary | ICD-10-CM

## 2017-12-25 DIAGNOSIS — D508 Other iron deficiency anemias: Secondary | ICD-10-CM

## 2017-12-25 DIAGNOSIS — E559 Vitamin D deficiency, unspecified: Secondary | ICD-10-CM | POA: Diagnosis not present

## 2017-12-25 DIAGNOSIS — M19042 Primary osteoarthritis, left hand: Secondary | ICD-10-CM

## 2017-12-25 DIAGNOSIS — Z6828 Body mass index (BMI) 28.0-28.9, adult: Secondary | ICD-10-CM | POA: Diagnosis not present

## 2017-12-25 DIAGNOSIS — Z0000 Encounter for general adult medical examination without abnormal findings: Secondary | ICD-10-CM

## 2017-12-25 DIAGNOSIS — E7800 Pure hypercholesterolemia, unspecified: Secondary | ICD-10-CM | POA: Diagnosis not present

## 2017-12-25 DIAGNOSIS — Z1239 Encounter for other screening for malignant neoplasm of breast: Secondary | ICD-10-CM

## 2017-12-25 DIAGNOSIS — E876 Hypokalemia: Secondary | ICD-10-CM | POA: Diagnosis not present

## 2017-12-25 MED ORDER — METHYLPREDNISOLONE 4 MG PO TBPK: ORAL_TABLET | ORAL | 0 refills | Status: DC

## 2017-12-25 NOTE — Progress Notes (Signed)
Patient: [name], Female    DOB: 1955/08/06, 62 y.o.   MRN: [id number] Visit Date: 12/25/2017  Today's Provider: Mar Daring, PA-C   No chief complaint on file.  Subjective:    Annual wellness visit [name] is a 62 y.o. female. She feels well. She reports exercising none, but keeps very active. She reports she is sleeping well.  Last CPE- 12/09/2016 Last pap- 12/09/2016- Negative. HPV negative Last mammogram- 12/30/2016- BI-RADS 1 Last colonoscopy- 10/28/2014- polyps, recheck 5 years. -----------------------------------------------------------   Review of Systems  Constitutional: Negative.   HENT: Negative.   Eyes: Negative.   Respiratory: Negative.   Cardiovascular: Negative.   Gastrointestinal: Negative.   Endocrine: Negative.   Genitourinary: Negative.   Musculoskeletal: Negative.   Skin: Negative.   Allergic/Immunologic: Negative.   Neurological: Negative.   Hematological: Negative.   Psychiatric/Behavioral: Negative.     Social History   Socioeconomic History  . Marital status: Single    Spouse name: Not on file  . Number of children: 3  . Years of education: Not on file  . Highest education level: Not on file  Occupational History  . Not on file  Social Needs  . Financial resource strain: Not on file  . Food insecurity:    Worry: Not on file    Inability: Not on file  . Transportation needs:    Medical: Not on file    Non-medical: Not on file  Tobacco Use  . Smoking status: Never Smoker  . Smokeless tobacco: Never Used  Substance and Sexual Activity  . Alcohol use: No  . Drug use: No  . Sexual activity: Not on file  Lifestyle  . Physical activity:    Days per week: Not on file    Minutes per session: Not on file  . Stress: Not on file  Relationships  . Social connections:    Talks on phone: Not on file    Gets together: Not on file    Attends religious service: Not on file    Active member of club or  organization: Not on file    Attends meetings of clubs or organizations: Not on file    Relationship status: Not on file  . Intimate partner violence:    Fear of current or ex partner: Not on file    Emotionally abused: Not on file    Physically abused: Not on file    Forced sexual activity: Not on file  Other Topics Concern  . Not on file  Social History Narrative  . Not on file    Past Medical History:  Diagnosis Date  . Allergy   . Anemia    takes Ferrous Sulfate daily  . Arthritis   . GERD (gastroesophageal reflux disease)    takes OTC med  . Hyperlipidemia   . Hypertension    takes Amlodipine daily  . Low back pain    occasionally     Patient Active Problem List   Diagnosis Date Noted  . CVA (cerebral vascular accident) ([place]) 03/14/2017  . Gastritis   . BPPV (benign paroxysmal positional vertigo) 03/24/2015  . Right hip pain 01/07/2015  . Absolute anemia 01/06/2015  . BMI 29.0-29.9,adult 01/06/2015  . Cervical disc disease 01/06/2015  . Atypical chest pain 01/06/2015  . Generalized pruritus 01/06/2015  . Acid reflux 01/06/2015  . Cervicogenic headache 01/06/2015  . Hypercholesteremia 01/06/2015  . High potassium 01/06/2015  . Low back pain 01/06/2015  .  Pseudoarthrosis of cervical spine ([place]) 08/26/2013  . Accumulation of fluid in tissues 12/24/2009  . Avitaminosis D 10/15/2009  . Cardiac conduction disorder 09/30/2008  . Essential (primary) hypertension 09/30/2008  . Blood in the urine 09/30/2008    Past Surgical History:  Procedure Laterality Date  . ANTERIOR CERVICAL DECOMP/DISCECTOMY FUSION  10/19/2011   Procedure: ANTERIOR CERVICAL DECOMPRESSION/DISCECTOMY FUSION 1 LEVEL/HARDWARE REMOVAL;  Surgeon: Elaina Hoops, MD;  Location: [facility];  Service: Neurosurgery;  Laterality: N/A;  Cervical four - five  Anterior cervical decompression fusion with redo at six - seven  Rm 33  . COLONOSCOPY  08/2014   polyps  . ESOPHAGOGASTRODUODENOSCOPY (EGD) WITH  PROPOFOL N/A 06/23/2015   Procedure: ESOPHAGOGASTRODUODENOSCOPY (EGD) WITH PROPOFOL;  Surgeon: [name], MD;  Location: [facility] END[facility]COPY;  Service: Endoscopy;  Laterality: N/A;  . NECK SURGERY    . POSTERIOR CERVICAL FUSION/FORAMINOTOMY N/A 08/26/2013   Procedure: C/4-5,C/5-6,C/6-7 Posterior Cervical Fusion w/lateral mass fixation;  Surgeon: Elaina Hoops, MD;  Location: [facility];  Service: Neurosurgery;  Laterality: N/A;  . TUBAL LIGATION      Her family history includes Heart disease in her mother; Hypertension in her mother. There is no history of Breast cancer.      Current Outpatient Medications:  .  acetaminophen (TYLENOL) 500 MG tablet, Take 2 tablets (1,000 mg total) by mouth every 6 (six) hours as needed., Disp: 90 tablet, Rfl: 1 .  albuterol (PROVENTIL HFA;VENTOLIN HFA) 108 (90 Base) MCG/ACT inhaler, Inhale 2 puffs into the lungs every 6 (six) hours as needed for wheezing or shortness of breath., Disp: 1 Inhaler, Rfl: 2 .  amLODipine (NORVASC) 10 MG tablet, Take 1 tablet (10 mg total) by mouth daily., Disp: 30 tablet, Rfl: 6 .  atorvastatin (LIPITOR) 40 MG tablet, TAKE 1 TABLET BY MOUTH ONCE DAILY, Disp: 90 tablet, Rfl: 1 .  Cholecalciferol (VITAMIN D) 2000 UNITS tablet, Take 2,000 Units by mouth daily., Disp: , Rfl:  .  clopidogrel (PLAVIX) 75 MG tablet, TAKE 1 TABLET BY MOUTH ONCE DAILY, Disp: 90 tablet, Rfl: 1 .  dexlansoprazole (DEXILANT) 60 MG capsule, Take 1 capsule (60 mg total) by mouth daily., Disp: 90 capsule, Rfl: 1 .  ferrous sulfate 325 (65 FE) MG tablet, Take 325 mg by mouth daily with breakfast., Disp: , Rfl:  .  fluticasone (FLONASE) 50 MCG/ACT nasal spray, Place 2 sprays into both nostrils daily., Disp: 16 g, Rfl: 5 .  loratadine (CLARITIN) 10 MG tablet, Take 1 tablet (10 mg total) by mouth daily., Disp: 30 tablet, Rfl: 3 .  Omega-3 Fatty Acids (FISH OIL) 1200 MG CAPS, Take 1 capsule by mouth daily., Disp: , Rfl:  .  ondansetron (ZOFRAN ODT) 4 MG disintegrating  tablet, Take 1 tablet (4 mg total) by mouth every 8 (eight) hours as needed for nausea or vomiting., Disp: 20 tablet, Rfl: 0 .  Potassium 75 MG TABS, Take by mouth., Disp: , Rfl:  .  sucralfate (CARAFATE) 1 g tablet, Take 1 tablet (1 g total) by mouth 4 (four) times daily -  with meals and at bedtime., Disp: 120 tablet, Rfl: 5  Patient Care Team: Mar Daring, PA-C as PCP - General (Family Medicine) Carmon Ginsberg, [place] as Referring Physician (Family Medicine)     Objective:   Vitals: BP 120/70 (BP Location: Left Arm, Patient Position: Sitting, Cuff Size: Normal)   Pulse 75   Temp 97.9 F (36.6 C) (Oral)   Resp 16   Ht 5\' 5"  (1.651  m)   Wt 172 lb 9.6 oz (78.3 kg)   BMI 28.72 kg/m   Physical Exam  Constitutional: She is oriented to person, place, and time. She appears well-developed and well-nourished.  HENT:  Head: Normocephalic.  Right Ear: External ear normal.  Left Ear: External ear normal.  Nose: Nose normal.  Mouth/Throat: Oropharynx is clear and moist.  Eyes: Pupils are equal, round, and reactive to light. Conjunctivae and EOM are normal.  Neck: Normal range of motion.  Cardiovascular: Normal rate, regular rhythm, normal heart sounds and intact distal pulses.  Pulmonary/Chest: Effort normal and breath sounds normal.  Abdominal: Soft. Bowel sounds are normal.  Musculoskeletal: Normal range of motion.  Neurological: She is alert and oriented to person, place, and time.  Skin: Skin is warm and dry.  Psychiatric: She has a normal mood and affect. Her behavior is normal. Judgment and thought content normal.    Activities of Daily Living In your present state of health, do you have any difficulty performing the following activities: 12/25/2017 03/14/2017  Hearing? N N  Vision? N N  Difficulty concentrating or making decisions? N N  Walking or climbing stairs? N N  Dressing or bathing? N N  Doing errands, shopping? N N  Some recent data might be hidden    Fall  Risk Assessment Fall Risk  12/25/2017 12/07/2016 09/08/2015 01/07/2015  Falls in the past year? No No No No     Depression Screen PHQ 2/9 Scores 12/25/2017 12/07/2016 12/07/2016 09/08/2015  PHQ - 2 Score 0 0 0 1  PHQ- 9 Score - 0 - -    Cognitive Testing - 6-CIT  Correct? Score   What year is it? yes 0 0 or 4  What month is it? yes 0 0 or 3  Memorize:    Pia Mau,  42,  [address],  [place],      What time is it? (within 1 hour) yes 0 0 or 3  Count backwards from 20 yes 0 0, 2, or 4  Name the months of the year yes 0 0, 2, or 4  Repeat name & address above No 3 0, 2, 4, 6, 8, or 10       TOTAL SCORE  3/28   Interpretation:  Normal  Normal (0-7) Abnormal (8-28)       Assessment & Plan:     Annual Wellness Visit  Reviewed patient's Family Medical History Reviewed and updated list of patient's medical providers Assessment of cognitive impairment was done Assessed patient's functional ability Established a written schedule for health screening [place] Completed and Reviewed  Exercise Activities and Dietary recommendations Goals    None      Immunization History  Administered Date(s) Administered  . Influenza-Unspecified 03/07/2017  . Tdap 09/30/2008    Health Maintenance  Topic Date Due  . INFLUENZA VACCINE  02/22/2018  . TETANUS/TDAP  10/01/2018  . MAMMOGRAM  12/31/2018  . PAP SMEAR  12/10/2019  . COLONOSCOPY  10/27/2024  . Hepatitis C Screening  Completed  . HIV Screening  Completed     Discussed health benefits of physical activity, and encouraged her to engage in regular exercise appropriate for her age and condition.    1. Medicare annual wellness visit, subsequent Normal exam today. Up to date on all screenings and immunizations.   2. Breast cancer screening Breast exam today was normal. There is no family history of breast cancer. She does perform regular self breast exams. Mammogram  was ordered as below. Information for  [facility] clinic was given to patient so she may schedule her mammogram at her convenience. - MM DIGITAL SCREENING BILATERAL; Future  3. Essential (primary) hypertension Stable. Continue amlodipine 10mg . Will check labs as below and f/u pending results. - CBC with Differential/Platelet - Comprehensive metabolic panel - Hemoglobin A1c - Lipid panel - TSH  4. Pseudoarthrosis of cervical spine, subsequent encounter Stable. Uses tylenol prn.   5. Iron deficiency anemia secondary to inadequate dietary iron intake Stable. Continue ferrous sulfate supplement. Will check labs as below and f/u pending results. - CBC with Differential/Platelet  6. Avitaminosis D H/O this and postmenopausal. Will check labs as below and f/u pending results. - CBC with Differential/Platelet - Vitamin D (25 hydroxy)  7. Hypokalemia Will check labs as below and f/u pending results. - Comprehensive metabolic panel  8. Hypercholesteremia Stable. Continue atorvastatin. Will check labs as below and f/u pending results. - Comprehensive metabolic panel - Hemoglobin A1c - Lipid panel  9. BMI 28.0-28.9,adult Counseled patient on healthy lifestyle modifications including dieting and exercise.  - Comprehensive metabolic panel - Hemoglobin A1c - Lipid panel  10. Arthritis of left hand Worsening. Will try medrol dose pak as below. Can not take NSAIDs due to being on plavix for CAD, h/o CVA. She is to call if no improvements.  - methylPREDNISolone (MEDROL) 4 MG TBPK tablet; 6 day taper; take as directed on package instructions  Dispense: 21 tablet; Refill: 0  ------------------------------------------------------------------------------------------------------------    Mar Daring, PA-C  [facility]

## 2017-12-25 NOTE — Patient Instructions (Signed)
Health Maintenance for Postmenopausal Women Menopause is a normal process in which your reproductive ability comes to an end. This process happens gradually over a span of months to years, usually between the ages of 22 and 9. Menopause is complete when you have missed 12 consecutive menstrual periods. It is important to talk with your health care provider about some of the most common conditions that affect postmenopausal women, such as heart disease, cancer, and bone loss (osteoporosis). Adopting a healthy lifestyle and getting preventive care can help to promote your health and wellness. Those actions can also lower your chances of developing some of these common conditions. What should I know about menopause? During menopause, you may experience a number of symptoms, such as:  Moderate-to-severe hot flashes.  Night sweats.  Decrease in sex drive.  Mood swings.  Headaches.  Tiredness.  Irritability.  Memory problems.  Insomnia.  Choosing to treat or not to treat menopausal changes is an individual decision that you make with your health care provider. What should I know about hormone replacement therapy and supplements? Hormone therapy products are effective for treating symptoms that are associated with menopause, such as hot flashes and night sweats. Hormone replacement carries certain risks, especially as you become older. If you are thinking about using estrogen or estrogen with progestin treatments, discuss the benefits and risks with your health care provider. What should I know about heart disease and stroke? Heart disease, heart attack, and stroke become more likely as you age. This may be due, in part, to the hormonal changes that your body experiences during menopause. These can affect how your body processes dietary fats, triglycerides, and cholesterol. Heart attack and stroke are both medical emergencies. There are many things that you can do to help prevent heart disease  and stroke:  Have your blood pressure checked at least every 1-2 years. High blood pressure causes heart disease and increases the risk of stroke.  If you are 53-22 years old, ask your health care provider if you should take aspirin to prevent a heart attack or a stroke.  Do not use any tobacco products, including cigarettes, chewing tobacco, or electronic cigarettes. If you need help quitting, ask your health care provider.  It is important to eat a healthy diet and maintain a healthy weight. ? Be sure to include plenty of vegetables, fruits, low-fat dairy products, and lean protein. ? Avoid eating foods that are high in solid fats, added sugars, or salt (sodium).  Get regular exercise. This is one of the most important things that you can do for your health. ? Try to exercise for at least 150 minutes each week. The type of exercise that you do should increase your heart rate and make you sweat. This is known as moderate-intensity exercise. ? Try to do strengthening exercises at least twice each week. Do these in addition to the moderate-intensity exercise.  Know your numbers.Ask your health care provider to check your cholesterol and your blood glucose. Continue to have your blood tested as directed by your health care provider.  What should I know about cancer screening? There are several types of cancer. Take the following steps to reduce your risk and to catch any cancer development as early as possible. Breast Cancer  Practice breast self-awareness. ? This means understanding how your breasts normally appear and feel. ? It also means doing regular breast self-exams. Let your health care provider know about any changes, no matter how small.  If you are 40  or older, have a clinician do a breast exam (clinical breast exam or CBE) every year. Depending on your age, family history, and medical history, it may be recommended that you also have a yearly breast X-ray (mammogram).  If you  have a family history of breast cancer, talk with your health care provider about genetic screening.  If you are at high risk for breast cancer, talk with your health care provider about having an MRI and a mammogram every year.  Breast cancer (BRCA) gene test is recommended for women who have family members with BRCA-related cancers. Results of the assessment will determine the need for genetic counseling and BRCA1 and for BRCA2 testing. BRCA-related cancers include these types: ? Breast. This occurs in males or females. ? Ovarian. ? Tubal. This may also be called fallopian tube cancer. ? Cancer of the abdominal or pelvic lining (peritoneal cancer). ? Prostate. ? Pancreatic.  Cervical, Uterine, and Ovarian Cancer Your health care provider may recommend that you be screened regularly for cancer of the pelvic organs. These include your ovaries, uterus, and vagina. This screening involves a pelvic exam, which includes checking for microscopic changes to the surface of your cervix (Pap test).  For women ages 21-65, health care providers may recommend a pelvic exam and a Pap test every three years. For women ages 79-65, they may recommend the Pap test and pelvic exam, combined with testing for human papilloma virus (HPV), every five years. Some types of HPV increase your risk of cervical cancer. Testing for HPV may also be done on women of any age who have unclear Pap test results.  Other health care providers may not recommend any screening for nonpregnant women who are considered low risk for pelvic cancer and have no symptoms. Ask your health care provider if a screening pelvic exam is right for you.  If you have had past treatment for cervical cancer or a condition that could lead to cancer, you need Pap tests and screening for cancer for at least 20 years after your treatment. If Pap tests have been discontinued for you, your risk factors (such as having a new sexual partner) need to be  reassessed to determine if you should start having screenings again. Some women have medical problems that increase the chance of getting cervical cancer. In these cases, your health care provider may recommend that you have screening and Pap tests more often.  If you have a family history of uterine cancer or ovarian cancer, talk with your health care provider about genetic screening.  If you have vaginal bleeding after reaching menopause, tell your health care provider.  There are currently no reliable tests available to screen for ovarian cancer.  Lung Cancer Lung cancer screening is recommended for adults 69-62 years old who are at high risk for lung cancer because of a history of smoking. A yearly low-dose CT scan of the lungs is recommended if you:  Currently smoke.  Have a history of at least 30 pack-years of smoking and you currently smoke or have quit within the past 15 years. A pack-year is smoking an average of one pack of cigarettes per day for one year.  Yearly screening should:  Continue until it has been 15 years since you quit.  Stop if you develop a health problem that would prevent you from having lung cancer treatment.  Colorectal Cancer  This type of cancer can be detected and can often be prevented.  Routine colorectal cancer screening usually begins at  age 42 and continues through age 45.  If you have risk factors for colon cancer, your health care provider may recommend that you be screened at an earlier age.  If you have a family history of colorectal cancer, talk with your health care provider about genetic screening.  Your health care provider may also recommend using home test kits to check for hidden blood in your stool.  A small camera at the end of a tube can be used to examine your colon directly (sigmoidoscopy or colonoscopy). This is done to check for the earliest forms of colorectal cancer.  Direct examination of the colon should be repeated every  5-10 years until age 71. However, if early forms of precancerous polyps or small growths are found or if you have a family history or genetic risk for colorectal cancer, you may need to be screened more often.  Skin Cancer  Check your skin from head to toe regularly.  Monitor any moles. Be sure to tell your health care provider: ? About any new moles or changes in moles, especially if there is a change in a mole's shape or color. ? If you have a mole that is larger than the size of a pencil eraser.  If any of your family members has a history of skin cancer, especially at a young age, talk with your health care provider about genetic screening.  Always use sunscreen. Apply sunscreen liberally and repeatedly throughout the day.  Whenever you are outside, protect yourself by wearing long sleeves, pants, a wide-brimmed hat, and sunglasses.  What should I know about osteoporosis? Osteoporosis is a condition in which bone destruction happens more quickly than new bone creation. After menopause, you may be at an increased risk for osteoporosis. To help prevent osteoporosis or the bone fractures that can happen because of osteoporosis, the following is recommended:  If you are 46-71 years old, get at least 1,000 mg of calcium and at least 600 mg of vitamin D per day.  If you are older than age 55 but younger than age 65, get at least 1,200 mg of calcium and at least 600 mg of vitamin D per day.  If you are older than age 54, get at least 1,200 mg of calcium and at least 800 mg of vitamin D per day.  Smoking and excessive alcohol intake increase the risk of osteoporosis. Eat foods that are rich in calcium and vitamin D, and do weight-bearing exercises several times each week as directed by your health care provider. What should I know about how menopause affects my mental health? Depression may occur at any age, but it is more common as you become older. Common symptoms of depression  include:  Low or sad mood.  Changes in sleep patterns.  Changes in appetite or eating patterns.  Feeling an overall lack of motivation or enjoyment of activities that you previously enjoyed.  Frequent crying spells.  Talk with your health care provider if you think that you are experiencing depression. What should I know about immunizations? It is important that you get and maintain your immunizations. These include:  Tetanus, diphtheria, and pertussis (Tdap) booster vaccine.  Influenza every year before the flu season begins.  Pneumonia vaccine.  Shingles vaccine.  Your health care provider may also recommend other immunizations. This information is not intended to replace advice given to you by your health care provider. Make sure you discuss any questions you have with your health care provider. Document Released: 09/02/2005  Document Revised: 01/29/2016 Document Reviewed: 04/14/2015 Elsevier Interactive Patient Education  2018 [employer]  

## 2018-01-10 ENCOUNTER — Emergency Department: Payer: Medicare HMO

## 2018-01-10 ENCOUNTER — Emergency Department
Admission: EM | Admit: 2018-01-10 | Discharge: 2018-01-10 | Disposition: A | Discharge status: Home / Self Care | Payer: Medicare HMO | Attending: Emergency Medicine | Admitting: Emergency Medicine

## 2018-01-10 DIAGNOSIS — Z7901 Long term (current) use of anticoagulants: Secondary | ICD-10-CM | POA: Insufficient documentation

## 2018-01-10 DIAGNOSIS — Y929 Unspecified place or not applicable: Secondary | ICD-10-CM | POA: Insufficient documentation

## 2018-01-10 DIAGNOSIS — Y999 Unspecified external cause status: Secondary | ICD-10-CM | POA: Insufficient documentation

## 2018-01-10 DIAGNOSIS — Z79899 Other long term (current) drug therapy: Secondary | ICD-10-CM | POA: Insufficient documentation

## 2018-01-10 DIAGNOSIS — I10 Essential (primary) hypertension: Secondary | ICD-10-CM | POA: Diagnosis not present

## 2018-01-10 DIAGNOSIS — S76811A Strain of other specified muscles, fascia and tendons at thigh level, right thigh, initial encounter: Secondary | ICD-10-CM | POA: Diagnosis not present

## 2018-01-10 DIAGNOSIS — X509XXA Other and unspecified overexertion or strenuous movements or postures, initial encounter: Secondary | ICD-10-CM | POA: Diagnosis not present

## 2018-01-10 DIAGNOSIS — S79911A Unspecified injury of right hip, initial encounter: Secondary | ICD-10-CM | POA: Diagnosis present

## 2018-01-10 DIAGNOSIS — S76211A Strain of adductor muscle, fascia and tendon of right thigh, initial encounter: Secondary | ICD-10-CM

## 2018-01-10 DIAGNOSIS — S76219A Strain of adductor muscle, fascia and tendon of unspecified thigh, initial encounter: Secondary | ICD-10-CM | POA: Insufficient documentation

## 2018-01-10 DIAGNOSIS — Y939 Activity, unspecified: Secondary | ICD-10-CM | POA: Insufficient documentation

## 2018-01-10 DIAGNOSIS — M25551 Pain in right hip: Secondary | ICD-10-CM | POA: Diagnosis not present

## 2018-01-10 MED ORDER — CYCLOBENZAPRINE HCL 10 MG PO TABS: 10.0000 mg | ORAL_TABLET | Freq: Three times a day (TID) | ORAL | 0 refills | Status: DC | PRN

## 2018-01-10 MED ORDER — TRAMADOL HCL 50 MG PO TABS
50.0000 mg | ORAL_TABLET | Freq: Once | ORAL | Status: AC
  Administered 2018-01-10: 50 mg via ORAL
  Filled 2018-01-10: qty 1

## 2018-01-10 MED ORDER — OXYCODONE-ACETAMINOPHEN 5-325 MG PO TABS: 1.0000 | ORAL_TABLET | ORAL | 0 refills | Status: DC | PRN

## 2018-01-10 MED ORDER — CYCLOBENZAPRINE HCL 10 MG PO TABS
10.0000 mg | ORAL_TABLET | Freq: Once | ORAL | Status: AC
  Administered 2018-01-10: 10 mg via ORAL
  Filled 2018-01-10: qty 1

## 2018-01-10 NOTE — ED Notes (Signed)
Pt in NAd at time of departure, VSS, pt verbalizes d/c understanding and follow up. Pt in wc in lobby waiting on ride

## 2018-01-10 NOTE — Discharge Instructions (Addendum)
Follow-up with PCP if no improvement in 1 week.

## 2018-01-10 NOTE — ED Provider Notes (Signed)
[facility] Emergency Department Provider Note   ____________________________________________   First MD Initiated Contact with Patient 01/10/18 1248     (approximate)  I have reviewed the triage vital signs and the nursing notes.   HISTORY  Chief Complaint Leg Pain    HPI [name] is a 62 y.o. female patient complain increasing right hip and pelvic pain for 3 days.  Patient pain increased with walking.  Patient denies provocative incident for complaint.  Patient described the pain is "shooting/spasm".  Patient stated mild relief with a heating pad.  Patient denies bladder bowel dysfunction.  Patient denies back pain.  Past Medical History:  Diagnosis Date  . Allergy   . Anemia    takes Ferrous Sulfate daily  . Arthritis   . GERD (gastroesophageal reflux disease)    takes OTC med  . Hyperlipidemia   . Hypertension    takes Amlodipine daily  . Low back pain    occasionally    Patient Active Problem List   Diagnosis Date Noted  . History of CVA (cerebrovascular accident) 03/14/2017  . Gastritis   . BPPV (benign paroxysmal positional vertigo) 03/24/2015  . Right hip pain 01/07/2015  . Absolute anemia 01/06/2015  . BMI 28.0-28.9,adult 01/06/2015  . Cervical disc disease 01/06/2015  . Atypical chest pain 01/06/2015  . Generalized pruritus 01/06/2015  . Acid reflux 01/06/2015  . Cervicogenic headache 01/06/2015  . Hypercholesteremia 01/06/2015  . Low back pain 01/06/2015  . Pseudoarthrosis of cervical spine ([place]) 08/26/2013  . Accumulation of fluid in tissues 12/24/2009  . Avitaminosis D 10/15/2009  . Cardiac conduction disorder 09/30/2008  . Essential (primary) hypertension 09/30/2008  . Blood in the urine 09/30/2008    Past Surgical History:  Procedure Laterality Date  . ANTERIOR CERVICAL DECOMP/DISCECTOMY FUSION  10/19/2011   Procedure: ANTERIOR CERVICAL DECOMPRESSION/DISCECTOMY FUSION 1 LEVEL/HARDWARE REMOVAL;  Surgeon: Elaina Hoops, MD;  Location: [place] [facility];  Service: Neurosurgery;  Laterality: N/A;  Cervical four - five  Anterior cervical decompression fusion with redo at six - seven  Rm 33  . COLONOSCOPY  08/2014   polyps  . ESOPHAGOGASTRODUODENOSCOPY (EGD) WITH PROPOFOL N/A 06/23/2015   Procedure: ESOPHAGOGASTRODUODENOSCOPY (EGD) WITH PROPOFOL;  Surgeon: [name], MD;  Location: [facility] ENDOSCOPY;  Service: Endoscopy;  Laterality: N/A;  . NECK SURGERY    . POSTERIOR CERVICAL FUSION/FORAMINOTOMY N/A 08/26/2013   Procedure: C/4-5,C/5-6,C/6-7 Posterior Cervical Fusion w/lateral mass fixation;  Surgeon: Elaina Hoops, MD;  Location: [place] [facility];  Service: Neurosurgery;  Laterality: N/A;  . TUBAL LIGATION      Prior to Admission medications   Medication Sig Start Date End Date Taking? Authorizing Provider  acetaminophen (TYLENOL) 500 MG tablet Take 2 tablets (1,000 mg total) by mouth every 6 (six) hours as needed. 03/22/17   Mar Daring, PA-C  albuterol (PROVENTIL HFA;VENTOLIN HFA) 108 (90 Base) MCG/ACT inhaler Inhale 2 puffs into the lungs every 6 (six) hours as needed for wheezing or shortness of breath. 08/04/17   Merlyn Lot, MD  amLODipine (NORVASC) 10 MG tablet Take 1 tablet (10 mg total) by mouth daily. 11/16/17   Mar Daring, PA-C  atorvastatin (LIPITOR) 40 MG tablet TAKE 1 TABLET BY MOUTH ONCE DAILY 11/21/17   Mar Daring, PA-C  Cholecalciferol (VITAMIN D) 2000 UNITS tablet Take 2,000 Units by mouth daily.    [provider]  clopidogrel (PLAVIX) 75 MG tablet TAKE 1 TABLET BY MOUTH ONCE DAILY 11/21/17   Burnette,  Clearnce Sorrel, PA-C  cyclobenzaprine (FLEXERIL) 10 MG tablet Take 1 tablet (10 mg total) by mouth 3 (three) times daily as needed. 01/10/18   Sable Feil, PA-C  dexlansoprazole (DEXILANT) 60 MG capsule Take 1 capsule (60 mg total) by mouth daily. 09/28/17   Mar Daring, PA-C  ferrous sulfate 325 (65 FE) MG tablet Take 325 mg by mouth daily with breakfast.     [provider]  fluticasone (FLONASE) 50 MCG/ACT nasal spray Place 2 sprays into both nostrils daily. 09/28/17   Mar Daring, PA-C  loratadine (CLARITIN) 10 MG tablet Take 1 tablet (10 mg total) by mouth daily. 10/20/17   Mar Daring, PA-C  methylPREDNISolone (MEDROL) 4 MG TBPK tablet 6 day taper; take as directed on package instructions 12/25/17   Mar Daring, PA-C  Omega-3 Fatty Acids (FISH OIL) 1200 MG CAPS Take 1 capsule by mouth daily.    [provider]  oxyCODONE-acetaminophen (PERCOCET) 5-325 MG tablet Take 1 tablet by mouth every 4 (four) hours as needed for severe pain. 01/10/18 01/10/19  Sable Feil, PA-C  Potassium 75 MG TABS Take by mouth.    [provider]    Allergies Aspirin; Hydrochlorothiazide; Ibuprofen; Lisinopril; and Sulfa antibiotics  Family History  Problem Relation Age of Onset  . Hypertension Mother   . Heart disease Mother   . Breast cancer Neg Hx     Social History Social History   Tobacco Use  . Smoking status: Never Smoker  . Smokeless tobacco: Never Used  Substance Use Topics  . Alcohol use: No  . Drug use: No    Review of Systems Constitutional: No fever/chills Eyes: No visual changes. ENT: No sore throat. Cardiovascular: Denies chest pain. Respiratory: Denies shortness of breath. Gastrointestinal: No abdominal pain.  No nausea, no vomiting.  No diarrhea.  No constipation. Genitourinary: Negative for dysuria. Musculoskeletal: Right hip pain. Skin: Negative for rash. Neurological: Negative for headaches, focal weakness or numbness. Endocrine:Hyperlipidemia and hypertension. Hematological/Lymphatic:Anemia. Allergic/Immunilogical: See medication list. ____________________________________________   PHYSICAL EXAM:  VITAL SIGNS: ED Triage Vitals [01/10/18 1204]  Enc Vitals Group     BP (!) 109/56     Pulse Rate 64     Resp 18     Temp 98.3 F (36.8 C)     Temp Source Oral      SpO2 100 %     Weight 175 lb (79.4 kg)     Height 5\' 5"  (1.651 m)     Head Circumference      Peak Flow      Pain Score 9     Pain Loc      Pain Edu?      Excl. in [place]?     Constitutional: Alert and oriented. Well appearing and in no acute distress. Cardiovascular: Normal rate, regular rhythm. Grossly normal heart sounds.  Good peripheral circulation. Respiratory: Normal respiratory effort.  No retractions. Lungs CTAB. Gastrointestinal: Soft and nontender. No distention. No abdominal bruits. No CVA tenderness. Genitourinary: Deferred Musculoskeletal: No obvious deformity to the right hip.  No leg length discrepancy in comparison to the left.  Patient has moderate guarding palpation right inguinal area.  Decreased range of motion with abduction. Neurologic:  Normal speech and language. No gross focal neurologic deficits are appreciated. No gait instability. Skin:  Skin is warm, dry and intact. No rash noted. Psychiatric: Mood and affect are normal. Speech and behavior are normal.  ____________________________________________   LABS (all labs ordered are  listed, but only abnormal results are displayed)  Labs Reviewed - No data to display ____________________________________________  EKG   ____________________________________________  RADIOLOGY  ED MD interpretation:    Official radiology report(s): Dg Hip Unilat W Or Wo Pelvis 2-3 Views Right  Result Date: 01/10/2018 CLINICAL DATA:  Pain for several days EXAM: DG HIP (WITH OR WITHOUT PELVIS) 2-3V RIGHT COMPARISON:  None. FINDINGS: Frontal pelvis as well as frontal and lateral right hip images were obtained. No fracture or dislocation. There is subchondral cystic change in the right acetabulum. There is no appreciable joint space narrowing. No erosive change. Sacroiliac joints appear normal bilaterally. IMPRESSION: Subchondral cystic change in the right acetabulum consistent with a degree of osteoarthritic change. No appreciable  joint space narrowing, however. No fracture or dislocation. Electronically Signed   By: Lowella Grip III M.D.   On: 01/10/2018 13:24    ____________________________________________   PROCEDURES  Procedure(s) performed: None  Procedures  Critical Care performed: No  ____________________________________________   INITIAL IMPRESSION / ASSESSMENT AND PLAN / ED COURSE  As part of my medical decision making, I reviewed the following data within the [place]    Right hip and pelvic pain secondary to inguinal strain.  Discussed x-ray findings with patient.  Patient given discharge care instruction.  Patient advised follow-up PCP in 1 week if no improvement.  Return to ED if condition worsens.      ____________________________________________   FINAL CLINICAL IMPRESSION(S) / ED DIAGNOSES  Final diagnoses:  Inguinal strain, right, initial encounter     ED Discharge Orders        Ordered    oxyCODONE-acetaminophen (PERCOCET) 5-325 MG tablet  Every 4 hours PRN     01/10/18 1331    cyclobenzaprine (FLEXERIL) 10 MG tablet  3 times daily PRN     01/10/18 1331       Note:  This document was prepared using Dragon voice recognition software and may include unintentional dictation errors.    Sable Feil, PA-C 01/10/18 1334    Earleen Newport, MD 01/10/18 [phone]

## 2018-01-10 NOTE — ED Triage Notes (Addendum)
Right hip pain x 3 days, worse with walking. Denies history or injury.   Pt alert and oriented X4, active, cooperative, pt in NAD. RR even and unlabored, color WNL.   Relief of pain with use of heating pad. Described pain as shooting and spasms.

## 2018-01-18 DIAGNOSIS — H524 Presbyopia: Secondary | ICD-10-CM | POA: Diagnosis not present

## 2018-01-19 ENCOUNTER — Ambulatory Visit
Admission: RE | Admit: 2018-01-19 | Discharge: 2018-01-19 | Disposition: A | Discharge status: Home / Self Care | Payer: Medicare HMO | Source: Ambulatory Visit | Attending: Physician Assistant | Admitting: Physician Assistant

## 2018-01-19 DIAGNOSIS — Z1231 Encounter for screening mammogram for malignant neoplasm of breast: Secondary | ICD-10-CM | POA: Insufficient documentation

## 2018-01-19 DIAGNOSIS — Z1239 Encounter for other screening for malignant neoplasm of breast: Secondary | ICD-10-CM

## 2018-01-30 ENCOUNTER — Other Ambulatory Visit: Payer: Self-pay | Admitting: Physician Assistant

## 2018-01-30 DIAGNOSIS — K219 Gastro-esophageal reflux disease without esophagitis: Secondary | ICD-10-CM

## 2018-01-30 MED ORDER — DEXLANSOPRAZOLE 60 MG PO CPDR: 1.0000 | DELAYED_RELEASE_CAPSULE | Freq: Every day | ORAL | 2 refills | Status: DC

## 2018-01-30 NOTE — Telephone Encounter (Signed)
Pt states she needs a refill of Dexlansoprazole 60 MG sent to [facility].    Pt states she is out of medication.

## 2018-02-09 ENCOUNTER — Other Ambulatory Visit: Payer: Self-pay | Admitting: Physician Assistant

## 2018-02-09 DIAGNOSIS — R1013 Epigastric pain: Secondary | ICD-10-CM

## 2018-02-20 ENCOUNTER — Ambulatory Visit (INDEPENDENT_AMBULATORY_CARE_PROVIDER_SITE_OTHER): Payer: Medicare HMO | Admitting: Physician Assistant

## 2018-02-20 ENCOUNTER — Encounter: Payer: Self-pay | Admitting: Physician Assistant

## 2018-02-20 VITALS — BP 144/72 | HR 64 | Temp 97.9°F | Resp 16 | Wt 174.0 lb

## 2018-02-20 DIAGNOSIS — R1031 Right lower quadrant pain: Secondary | ICD-10-CM | POA: Diagnosis not present

## 2018-02-20 NOTE — Progress Notes (Signed)
Patient: [name] Female    DOB: 10-18-1955   62 y.o.   MRN: [id number] Visit Date: 02/20/2018  Today's Provider: Mar Daring, PA-C   I, Martha Clan, CMA, am acting as scribe for [employer], PA-C.  Chief Complaint  Patient presents with  . Leg Pain   Subjective:    Leg Pain   The injury mechanism is unknown. Pain location: right groin radiating down right leg; pt also notes inguinal swelling. The quality of the pain is described as aching. The pain is severe. The pain has been fluctuating since onset. Associated symptoms include an inability to bear weight, muscle weakness and numbness (occasional shooting pain down right leg). Pertinent negatives include no loss of motion, loss of sensation or tingling. Treatments tried: stretching and tylenol. The treatment provided mild relief.   Pt was seen in the ED for an inguinal strain on 01/10/2018. She was prescribed percocet 5-325 1 tab po q 4 hours PRN, and flexeril 10 mg 1 tab po TID PRN, with relief. States the sx are similar.     Allergies  Allergen Reactions  . Aspirin Nausea Only  . Hydrochlorothiazide     Rash  . Ibuprofen Other (See Comments)    Pt says her doctor doesn't want her taking this with the blood thinner she's taking  . Lisinopril     Other reaction(s): Unknown  . Sulfa Antibiotics Hives and Nausea Only     Current Outpatient Medications:  .  acetaminophen (TYLENOL) 500 MG tablet, Take 2 tablets (1,000 mg total) by mouth every 6 (six) hours as needed., Disp: 90 tablet, Rfl: 1 .  albuterol (PROVENTIL HFA;VENTOLIN HFA) 108 (90 Base) MCG/ACT inhaler, Inhale 2 puffs into the lungs every 6 (six) hours as needed for wheezing or shortness of breath., Disp: 1 Inhaler, Rfl: 2 .  amLODipine (NORVASC) 10 MG tablet, Take 1 tablet (10 mg total) by mouth daily., Disp: 30 tablet, Rfl: 6 .  atorvastatin (LIPITOR) 40 MG tablet, TAKE 1 TABLET BY MOUTH ONCE DAILY, Disp: 90 tablet, Rfl: 1 .   Cholecalciferol (VITAMIN D) 2000 UNITS tablet, Take 2,000 Units by mouth daily., Disp: , Rfl:  .  clopidogrel (PLAVIX) 75 MG tablet, TAKE 1 TABLET BY MOUTH ONCE DAILY, Disp: 90 tablet, Rfl: 1 .  dexlansoprazole (DEXILANT) 60 MG capsule, Take 1 capsule (60 mg total) by mouth daily., Disp: 90 capsule, Rfl: 2 .  ferrous sulfate 325 (65 FE) MG tablet, Take 325 mg by mouth daily with breakfast., Disp: , Rfl:  .  fluticasone (FLONASE) 50 MCG/ACT nasal spray, Place 2 sprays into both nostrils daily., Disp: 16 g, Rfl: 5 .  loratadine (CLARITIN) 10 MG tablet, Take 1 tablet (10 mg total) by mouth daily., Disp: 30 tablet, Rfl: 3 .  Omega-3 Fatty Acids (FISH OIL) 1200 MG CAPS, Take 1 capsule by mouth daily., Disp: , Rfl:  .  Potassium 75 MG TABS, Take by mouth., Disp: , Rfl:   Review of Systems  Constitutional: Negative.   Respiratory: Negative.   Cardiovascular: Negative.   Gastrointestinal: Negative.   Musculoskeletal: Positive for arthralgias (right groin area) and joint swelling.  Neurological: Positive for numbness (occasional shooting pain down right leg). Negative for tingling and weakness.    Social History   Tobacco Use  . Smoking status: Never Smoker  . Smokeless tobacco: Never Used  Substance Use Topics  . Alcohol use: No   Objective:   BP (!) 144/72 (BP Location:  Left Arm, Patient Position: Sitting, Cuff Size: Normal)   Pulse 64   Temp 97.9 F (36.6 C) (Oral)   Resp 16   Wt 174 lb (78.9 kg)   SpO2 97%   BMI 28.96 kg/m  Vitals:   02/20/18 1336  BP: (!) 144/72  Pulse: 64  Resp: 16  Temp: 97.9 F (36.6 C)  TempSrc: Oral  SpO2: 97%  Weight: 174 lb (78.9 kg)     Physical Exam  Constitutional: She appears well-developed and well-nourished. No distress.  Neck: Normal range of motion. Neck supple.  Cardiovascular: Normal rate, regular rhythm and normal heart sounds. Exam reveals no gallop and no friction rub.  No murmur heard. Pulmonary/Chest: Effort normal and breath  sounds normal. No respiratory distress. She has no wheezes. She has no rales.  Abdominal: Soft. Bowel sounds are normal. There is no tenderness.  Musculoskeletal:       Right hip: She exhibits tenderness and swelling. She exhibits normal range of motion and normal strength.       Legs: Skin: She is not diaphoretic.  Vitals reviewed.   CLINICAL DATA:  Pain for several days  EXAM: DG HIP (WITH OR WITHOUT PELVIS) 2-3V RIGHT  COMPARISON:  None.  FINDINGS: Frontal pelvis as well as frontal and lateral right hip images were obtained. No fracture or dislocation. There is subchondral cystic change in the right acetabulum. There is no appreciable joint space narrowing. No erosive change. Sacroiliac joints appear normal bilaterally.  IMPRESSION: Subchondral cystic change in the right acetabulum consistent with a degree of osteoarthritic change. No appreciable joint space narrowing, however. No fracture or dislocation.   Electronically Signed   By: Lowella Grip III M.D.   On: 01/10/2018 13:24    Assessment & Plan:     1. Inguinal pain, right DDx: hernia, lymphadenopathy, strain of hip flexors, hip arthritis. Will get [place] as below to r/o lymphadenopathy or hernia. I will f/u pending results. Continue tylenol 1000mg  TID prn. May apply moist heating pad to area as well.  - US Abdomen Limited; Future

## 2018-02-28 ENCOUNTER — Ambulatory Visit
Admission: RE | Admit: 2018-02-28 | Discharge: 2018-02-28 | Disposition: A | Discharge status: Home / Self Care | Payer: Medicare HMO | Source: Ambulatory Visit | Attending: Physician Assistant | Admitting: Physician Assistant

## 2018-02-28 ENCOUNTER — Telehealth: Payer: Self-pay

## 2018-02-28 ENCOUNTER — Other Ambulatory Visit
Admission: RE | Admit: 2018-02-28 | Discharge: 2018-02-28 | Disposition: A | Discharge status: Home / Self Care | Payer: Medicare HMO | Source: Ambulatory Visit | Attending: Physician Assistant | Admitting: Physician Assistant

## 2018-02-28 DIAGNOSIS — R1031 Right lower quadrant pain: Secondary | ICD-10-CM | POA: Insufficient documentation

## 2018-02-28 DIAGNOSIS — D508 Other iron deficiency anemias: Secondary | ICD-10-CM | POA: Insufficient documentation

## 2018-02-28 DIAGNOSIS — I10 Essential (primary) hypertension: Secondary | ICD-10-CM | POA: Diagnosis not present

## 2018-02-28 DIAGNOSIS — R2241 Localized swelling, mass and lump, right lower limb: Secondary | ICD-10-CM | POA: Diagnosis not present

## 2018-02-28 DIAGNOSIS — E7800 Pure hypercholesterolemia, unspecified: Secondary | ICD-10-CM | POA: Insufficient documentation

## 2018-02-28 DIAGNOSIS — E559 Vitamin D deficiency, unspecified: Secondary | ICD-10-CM | POA: Diagnosis not present

## 2018-02-28 DIAGNOSIS — E876 Hypokalemia: Secondary | ICD-10-CM | POA: Insufficient documentation

## 2018-02-28 LAB — CBC WITH DIFFERENTIAL/PLATELET
BASOS ABS: 0.1 10*3/uL (ref 0–0.1)
Basophils Relative: 1 %
Eosinophils Absolute: 0.1 10*3/uL (ref 0–0.7)
Eosinophils Relative: 1 %
HEMATOCRIT: 42.6 % (ref 35.0–47.0)
Hemoglobin: 14.2 g/dL (ref 12.0–16.0)
LYMPHS PCT: 45 %
Lymphs Abs: 2.9 10*3/uL (ref 1.0–3.6)
MCH: 27.9 pg (ref 26.0–34.0)
MCHC: 33.4 g/dL (ref 32.0–36.0)
MCV: 83.8 fL (ref 80.0–100.0)
MONO ABS: 0.4 10*3/uL (ref 0.2–0.9)
Monocytes Relative: 6 %
NEUTROS ABS: 3 10*3/uL (ref 1.4–6.5)
Neutrophils Relative %: 47 %
Platelets: 341 10*3/uL (ref 150–440)
RBC: 5.08 MIL/uL (ref 3.80–5.20)
RDW: 13.5 % (ref 11.5–14.5)
WBC: 6.4 10*3/uL (ref 3.6–11.0)

## 2018-02-28 LAB — COMPREHENSIVE METABOLIC PANEL
ALT: 8 U/L (ref 0–44)
AST: 22 U/L (ref 15–41)
Albumin: 4.2 g/dL (ref 3.5–5.0)
Alkaline Phosphatase: 90 U/L (ref 38–126)
Anion gap: 9 (ref 5–15)
BILIRUBIN TOTAL: 0.6 mg/dL (ref 0.3–1.2)
BUN: 8 mg/dL (ref 8–23)
CO2: 30 mmol/L (ref 22–32)
CREATININE: 0.66 mg/dL (ref 0.44–1.00)
Calcium: 9.3 mg/dL (ref 8.9–10.3)
Chloride: 103 mmol/L (ref 98–111)
GFR calc Af Amer: >60 mL/min (ref >60)
Glucose, Bld: 112 mg/dL — ABNORMAL HIGH (ref 70–99)
Potassium: 3.3 mmol/L — ABNORMAL LOW (ref 3.5–5.1)
Sodium: 142 mmol/L (ref 135–145)
TOTAL PROTEIN: 7.7 g/dL (ref 6.5–8.1)

## 2018-02-28 LAB — LIPID PANEL
Cholesterol: 145 mg/dL (ref 0–200)
HDL: 45 mg/dL (ref >40)
LDL CALC: 67 mg/dL (ref 0–99)
Total CHOL/HDL Ratio: 3.2 RATIO
Triglycerides: 164 mg/dL — ABNORMAL HIGH (ref <150)
VLDL: 33 mg/dL (ref 0–40)

## 2018-02-28 LAB — TSH: TSH: 0.919 u[IU]/mL (ref 0.350–4.500)

## 2018-02-28 LAB — HEMOGLOBIN A1C
HEMOGLOBIN A1C: 6.1 % — AB (ref 4.8–5.6)
Mean Plasma Glucose: 128.37 mg/dL

## 2018-02-28 NOTE — Telephone Encounter (Signed)
Patient advised as directed below.  Thanks,  -Joseline 

## 2018-02-28 NOTE — Telephone Encounter (Signed)
-----   Message from Mar Daring, [place] sent at 02/28/2018  2:19 PM EDT ----- The area of concern is a normal sized lymph node. If it grows or becomes tender please let me know and we will get a CT scan for better evaluation.

## 2018-03-01 ENCOUNTER — Telehealth: Payer: Self-pay

## 2018-03-01 DIAGNOSIS — R599 Enlarged lymph nodes, unspecified: Secondary | ICD-10-CM

## 2018-03-01 LAB — VITAMIN D 25 HYDROXY (VIT D DEFICIENCY, FRACTURES): Vit D, 25-Hydroxy: 43.8 ng/mL (ref 30.0–100.0)

## 2018-03-01 IMAGING — CR DG CHEST 2V
2 series · 2 of 2 positions shown · non-contrast
Comparison: Chest radiograph performed 08/11/2017

CLINICAL DATA: Acute onset of generalized chest tightness and
productive cough.

EXAM:
CHEST  2 VIEW

[chest lat]
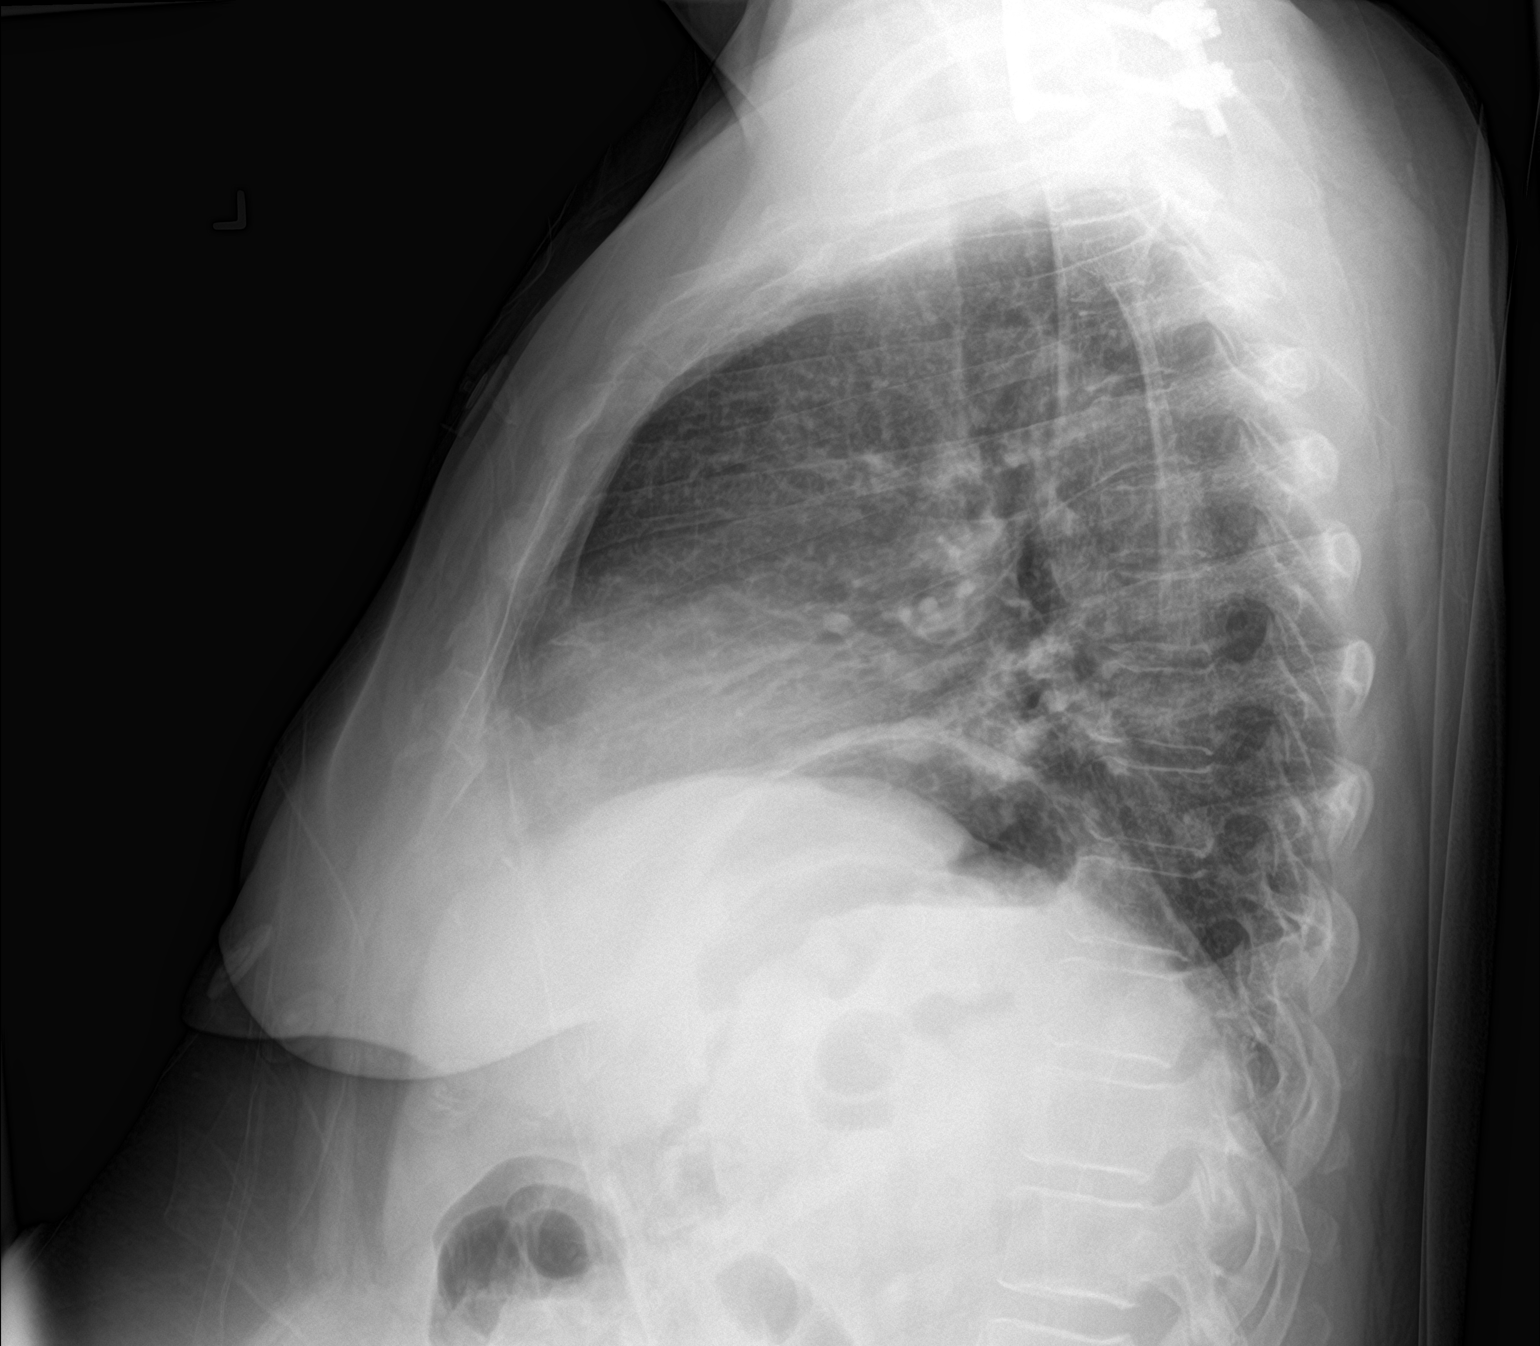

[chest ap]
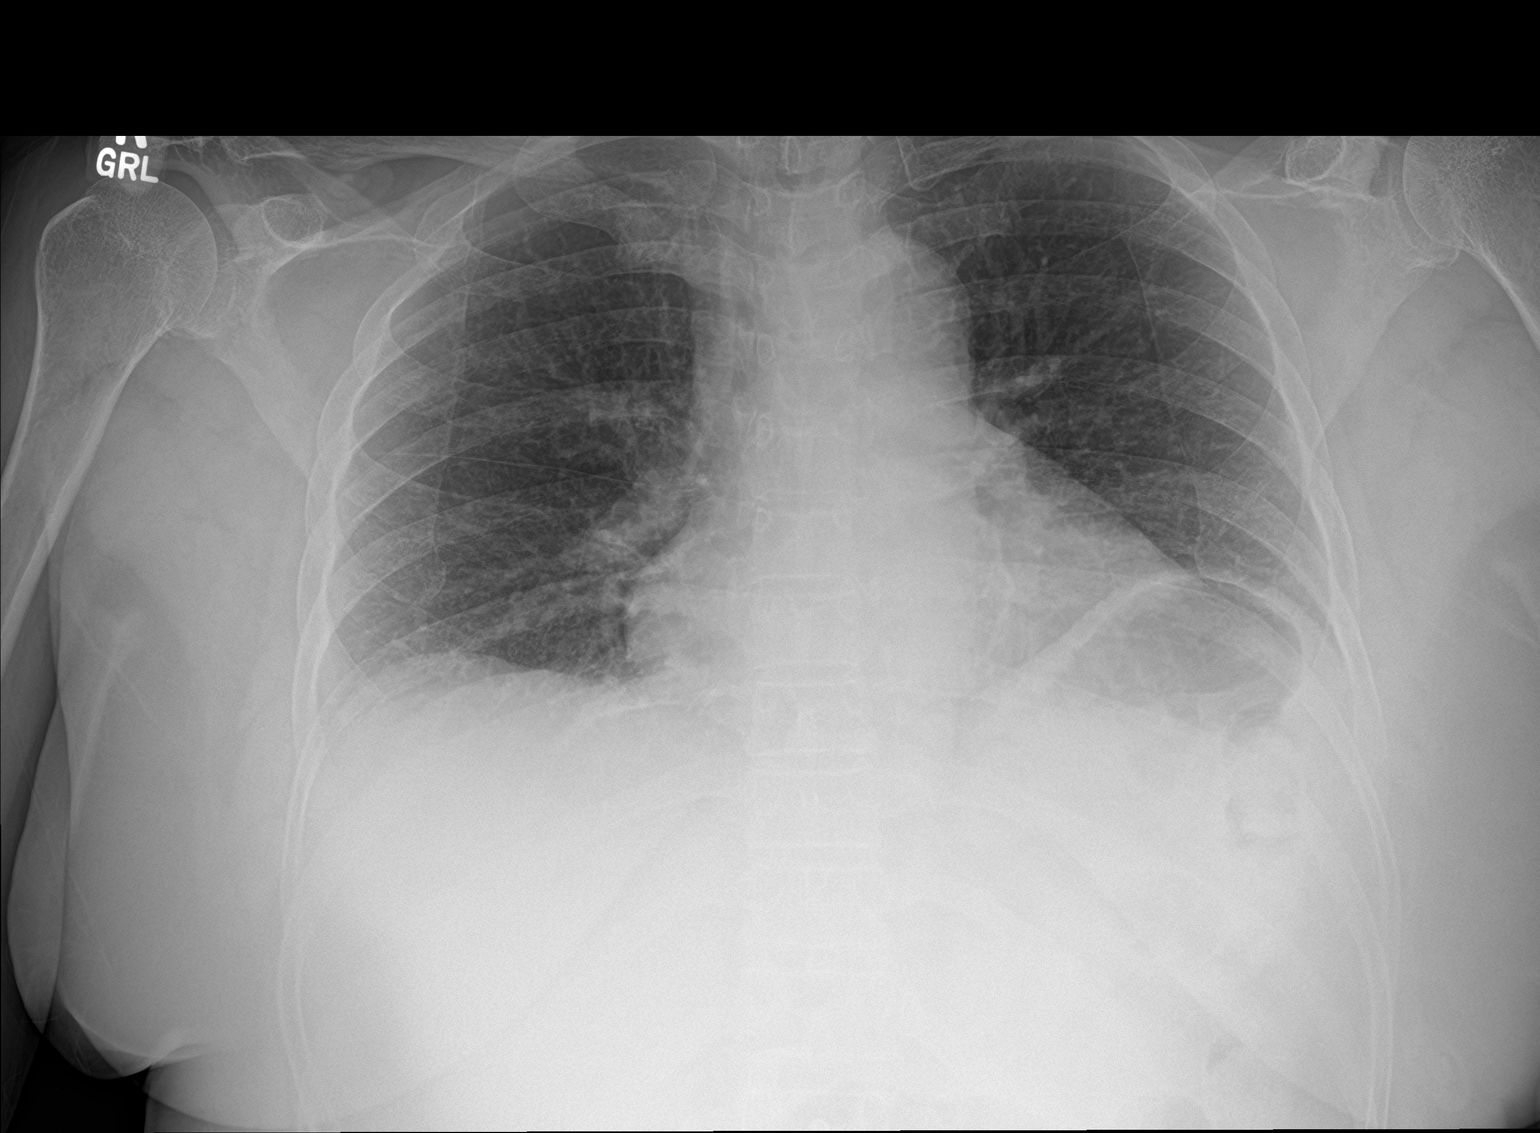

[2 of 2 positions shown; findings below may reference images not displayed]

FINDINGS: The lungs are hypoexpanded, with mild bibasilar atelectasis. No
pleural effusion or pneumothorax is seen.

The heart is normal in size. No acute osseous abnormalities are
identified. Cervical spinal fusion hardware is partially imaged.
IMPRESSION: Lungs hypoexpanded, with mild bibasilar atelectasis.

## 2018-03-01 NOTE — Telephone Encounter (Signed)
Pt is calling regarding the swelling in her leg.  She had an [place] yesterday that was normal.  She says she is concerned that the swelling is not going down and it is tender.  Should she proceed with a CT Scan.     Thanks,   -Mickel Baas

## 2018-03-01 NOTE — Telephone Encounter (Signed)
CT scan ordered

## 2018-03-01 NOTE — Telephone Encounter (Signed)
Patient advised as directed below.  Thanks,  -Emersen Carroll 

## 2018-03-01 NOTE — Telephone Encounter (Signed)
-----   Message from Mar Daring, PA-C sent at 03/01/2018  1:35 PM EDT ----- HgBA1c slightly increased to 6.1. Potassium is borderline low again. Make sure to be taking your potassium supplement. If you have been taking regularly, increase to twice daily. Kidney and liver function normal. Cholesterol stable and normal. Vit D normal. Thyroid normal. Blood count normal.

## 2018-03-09 ENCOUNTER — Ambulatory Visit
Admission: RE | Admit: 2018-03-09 | Discharge: 2018-03-09 | Disposition: A | Discharge status: Home / Self Care | Payer: Medicare HMO | Source: Ambulatory Visit | Attending: Physician Assistant | Admitting: Physician Assistant

## 2018-03-09 DIAGNOSIS — R102 Pelvic and perineal pain: Secondary | ICD-10-CM | POA: Diagnosis not present

## 2018-03-09 DIAGNOSIS — R599 Enlarged lymph nodes, unspecified: Secondary | ICD-10-CM | POA: Insufficient documentation

## 2018-03-09 MED ORDER — IOPAMIDOL (ISOVUE-300) INJECTION 61%
100.0000 mL | Freq: Once | INTRAVENOUS | Status: AC | PRN
  Administered 2018-03-09: 100 mL via INTRAVENOUS

## 2018-03-12 ENCOUNTER — Telehealth: Payer: Self-pay

## 2018-03-12 DIAGNOSIS — M545 Low back pain, unspecified: Secondary | ICD-10-CM

## 2018-03-12 NOTE — Telephone Encounter (Signed)
LMTCB  Thanks,  -Joseline 

## 2018-03-12 NOTE — Telephone Encounter (Signed)
Referral placed.

## 2018-03-12 NOTE — Telephone Encounter (Signed)
Pt advised.  Pt states she is still having swelling, and pain.  She says she is also experiencing leg weakness.  Pt is wanting to know what the next step should be.    Contact Number: [phone]  Please advise   Thanks,   -Mickel Baas

## 2018-03-12 NOTE — Telephone Encounter (Signed)
This could be coming from her back. I would recommend referral to orthopedics. If agreeable I will place order.

## 2018-03-12 NOTE — Telephone Encounter (Signed)
Patient advised as below.  

## 2018-03-12 NOTE — Telephone Encounter (Signed)
-----   Message from Mar Daring, PA-C sent at 03/12/2018  8:21 AM EDT ----- CT abd/pelvis was unremarkable. There is no abnormality noted in the area of concern.

## 2018-03-30 ENCOUNTER — Encounter (INDEPENDENT_AMBULATORY_CARE_PROVIDER_SITE_OTHER): Payer: Self-pay | Admitting: Orthopaedic Surgery

## 2018-03-30 ENCOUNTER — Ambulatory Visit (INDEPENDENT_AMBULATORY_CARE_PROVIDER_SITE_OTHER): Payer: Medicare HMO | Admitting: Orthopaedic Surgery

## 2018-03-30 VITALS — BP 139/71 | HR 70 | Resp 16 | Ht 65.0 in | Wt 174.0 lb

## 2018-03-30 DIAGNOSIS — G8929 Other chronic pain: Secondary | ICD-10-CM

## 2018-03-30 DIAGNOSIS — M545 Low back pain: Secondary | ICD-10-CM

## 2018-03-30 NOTE — Progress Notes (Signed)
Office Visit Note   Patient: [name]           Date of Birth: 19-Apr-1956           MRN: [id number] Visit Date: 03/30/2018              Requested by: Mar Daring, PA-C [address] [place], [place] [address] PCP: Mar Daring, PA-C   Assessment & Plan: Visit Diagnoses:  1. Chronic midline low back pain, with sciatica presence unspecified     Plan: Chronic right hip buttock and leg pain.  I believe this may originate from the lumbar spine.  Will order MRI scan.  Total time spent approximately 45 minutes 50% of the time in counseling discussing possible etiologies  Follow-Up Instructions: Return after MRI L-S spine.   Orders:  Orders Placed This Encounter  Procedures  . MR Lumbar Spine w/o contrast   No orders of the defined types were placed in this encounter.     Procedures: No procedures performed   Clinical Data: No additional findings.   Subjective: Chief Complaint  Patient presents with  . Right Hip - Pain  . Hip Pain    Right hip pain from hip down to ankle, buttock and low back pain  x 4 months, no injury, no right hip surgery, not diabetic, Tylenol doesn't help, difficulty walking and limping at times   Lindsay Jeffriesrelates at least a 69-month history of right leg pain.  Discomfort was insidious in onset without injury or trauma.  She is had some pain along the lateral aspect of her right hip into her groin along the top of her thigh and even as far distally as her legs occasional "cramping" of her feet.  Has had an extensive work-up to date through her primary care physician.  Films of her hips reveals chondral cyst formation in the right acetabulum consistent with mild osteoarthritis but no joint space narrowing she is really not had much discomfort in her groin.  I did review the films and agree with with the above.  No joint space narrowing or ectopic calcification.  She is also had a CT scan of her pelvis that did demonstrate  disc bulging and marginal osteophytes at L4-5 and L5-S1 with possible foraminal stenosis.  Films of the lumbar spine revealed no acute or traumatic findings there were some degenerative changes and disc space narrowing at L4-5 and L5-S1 there was some calcification of the aorta.  She denies any left lower extremity symptoms.  She denies any bowel or bladder changes  HPI  Review of Systems  Constitutional: Positive for activity change.  HENT: Negative for trouble swallowing.   Eyes: Negative for pain.  Respiratory: Negative for shortness of breath.   Cardiovascular: Positive for leg swelling.  Gastrointestinal: Negative for constipation.  Endocrine: Negative for cold intolerance.  Genitourinary: Negative for difficulty urinating.  Musculoskeletal: Positive for back pain, gait problem and joint swelling.  Skin: Negative for rash.  Allergic/Immunologic: Negative for food allergies.  Neurological: Positive for weakness.  Hematological: Does not bruise/bleed easily.  Psychiatric/Behavioral: Negative for sleep disturbance.     Objective: Vital Signs: BP 139/71 (BP Location: Left Arm, Patient Position: Sitting, Cuff Size: Normal)   Pulse 70   Resp 16   Ht 5\' 5"  (1.651 m)   Wt 174 lb (78.9 kg)   BMI 28.96 kg/m   Physical Exam  Constitutional: She is oriented to person, place, and time. She appears well-developed and well-nourished.  HENT:  Mouth/Throat: Oropharynx is clear and moist.  Eyes: Pupils are equal, round, and reactive to light. EOM are normal.  Pulmonary/Chest: Effort normal.  Neurological: She is alert and oriented to person, place, and time.  Skin: Skin is warm and dry.  Psychiatric: She has a normal mood and affect. Her behavior is normal.    Ortho Exam awake alert and oriented x3.  Comfortable sitting.  No pain with range of motion of either hip and internal/external rotation with either hyperflexion or at neutral.  Straight leg raise negative.  No significant  tenderness along the lateral aspect of either hip or greater trochanter.  Some mild buttock pain.  Mild percussible tenderness of the lower lumbar spine in the parasacral region.  No masses.  Reflexes were symmetrical.  Walks without a limp or and any ambulatory aid  Specialty Comments:  No specialty comments available.  Imaging: No results found.   PMFS History: Patient Active Problem List   Diagnosis Date Noted  . History of CVA (cerebrovascular accident) 03/14/2017  . Gastritis   . BPPV (benign paroxysmal positional vertigo) 03/24/2015  . Right hip pain 01/07/2015  . Absolute anemia 01/06/2015  . BMI 28.0-28.9,adult 01/06/2015  . Cervical disc disease 01/06/2015  . Atypical chest pain 01/06/2015  . Generalized pruritus 01/06/2015  . Acid reflux 01/06/2015  . Cervicogenic headache 01/06/2015  . Hypercholesteremia 01/06/2015  . Low back pain 01/06/2015  . Pseudoarthrosis of cervical spine ([place]) 08/26/2013  . Accumulation of fluid in tissues 12/24/2009  . Avitaminosis D 10/15/2009  . Cardiac conduction disorder 09/30/2008  . Essential (primary) hypertension 09/30/2008  . Blood in the urine 09/30/2008   Past Medical History:  Diagnosis Date  . Allergy   . Anemia    takes Ferrous Sulfate daily  . Arthritis   . GERD (gastroesophageal reflux disease)    takes OTC med  . Hyperlipidemia   . Hypertension    takes Amlodipine daily  . Low back pain    occasionally    Family History  Problem Relation Age of Onset  . Hypertension Mother   . Heart disease Mother   . Breast cancer Neg Hx     Past Surgical History:  Procedure Laterality Date  . ANTERIOR CERVICAL DECOMP/DISCECTOMY FUSION  10/19/2011   Procedure: ANTERIOR CERVICAL DECOMPRESSION/DISCECTOMY FUSION 1 LEVEL/HARDWARE REMOVAL;  Surgeon: Elaina Hoops, MD;  Location: [facility];  Service: Neurosurgery;  Laterality: N/A;  Cervical four - five  Anterior cervical decompression fusion with redo at six - seven  Rm 33  .  CARPAL TUNNEL RELEASE    . COLONOSCOPY  08/2014   polyps  . ESOPHAGOGASTRODUODENOSCOPY (EGD) WITH PROPOFOL N/A 06/23/2015   Procedure: ESOPHAGOGASTRODUODENOSCOPY (EGD) WITH PROPOFOL;  Surgeon: [name], MD;  Location: [facility];  Service: Endoscopy;  Laterality: N/A;  . NECK SURGERY    . POSTERIOR CERVICAL FUSION/FORAMINOTOMY N/A 08/26/2013   Procedure: C/4-5,C/5-6,C/6-7 Posterior Cervical Fusion w/lateral mass fixation;  Surgeon: Elaina Hoops, MD;  Location: [facility];  Service: Neurosurgery;  Laterality: N/A;  . TUBAL LIGATION     Social History   Occupational History  . Not on file  Tobacco Use  . Smoking status: Never Smoker  . Smokeless tobacco: Never Used  Substance and Sexual Activity  . Alcohol use: No  . Drug use: No  . Sexual activity: Not on file

## 2018-04-13 ENCOUNTER — Ambulatory Visit
Admission: RE | Admit: 2018-04-13 | Discharge: 2018-04-13 | Disposition: A | Discharge status: Home / Self Care | Payer: Medicare HMO | Source: Ambulatory Visit | Attending: Orthopaedic Surgery | Admitting: Orthopaedic Surgery

## 2018-04-13 DIAGNOSIS — M47816 Spondylosis without myelopathy or radiculopathy, lumbar region: Secondary | ICD-10-CM | POA: Diagnosis not present

## 2018-04-13 DIAGNOSIS — M545 Low back pain: Secondary | ICD-10-CM | POA: Diagnosis not present

## 2018-04-13 DIAGNOSIS — G8929 Other chronic pain: Secondary | ICD-10-CM

## 2018-04-17 ENCOUNTER — Ambulatory Visit (INDEPENDENT_AMBULATORY_CARE_PROVIDER_SITE_OTHER): Payer: Medicare HMO | Admitting: Orthopaedic Surgery

## 2018-04-17 ENCOUNTER — Other Ambulatory Visit (INDEPENDENT_AMBULATORY_CARE_PROVIDER_SITE_OTHER): Payer: Self-pay | Admitting: *Deleted

## 2018-04-17 ENCOUNTER — Encounter (INDEPENDENT_AMBULATORY_CARE_PROVIDER_SITE_OTHER): Payer: Self-pay | Admitting: Orthopaedic Surgery

## 2018-04-17 DIAGNOSIS — G8929 Other chronic pain: Secondary | ICD-10-CM | POA: Diagnosis not present

## 2018-04-17 DIAGNOSIS — M545 Low back pain, unspecified: Secondary | ICD-10-CM | POA: Insufficient documentation

## 2018-04-17 DIAGNOSIS — M5442 Lumbago with sciatica, left side: Principal | ICD-10-CM

## 2018-04-17 DIAGNOSIS — M5441 Lumbago with sciatica, right side: Principal | ICD-10-CM

## 2018-04-17 NOTE — Progress Notes (Signed)
Office Visit Note   Patient: [name]           Date of Birth: 21-Apr-1956           MRN: [id number] Visit Date: 04/17/2018              Requested by: Mar Daring, PA-C [address] [place], [place] [address] PCP: Mar Daring, PA-C   Assessment & Plan: Visit Diagnoses:  1. Chronic right-sided low back pain without sciatica     Plan: MRI scan was performed and compared to the study performed performed in December 2015.  There was similar degenerative changes of the lower lumbar spine including annular fissures at L4-5 and L5-S1.  No canal stenosis.  Minimal L4-5 and L5-S1 neuroforaminal narrowing.  Does have facet arthropathy.  No radicular symptoms.  Having some right-sided pain as far distally as her hip which I think is referred.  Discussion over 30 minutes regarding her present problem.  It is worth trying a course of physical therapy.  She is on a blood thinner but cannot take NSAIDs.  Reevaluate in 6 weeks  Follow-Up Instructions: Return in about 6 weeks (around 05/29/2018).   Orders:  No orders of the defined types were placed in this encounter.  No orders of the defined types were placed in this encounter.     Procedures: No procedures performed   Clinical Data: No additional findings.   Subjective: No chief complaint on file. Returns for reevaluation of chronic low back pain with some right-sided symptoms as far distally is the lateral aspect of her right hip.  No numbness or tingling.  MRI scan performed with the results as above  HPI  Review of Systems   Objective: Vital Signs: There were no vitals taken for this visit.  Physical Exam  Constitutional: She is oriented to person, place, and time. She appears well-developed and well-nourished.  HENT:  Mouth/Throat: Oropharynx is clear and moist.  Eyes: Pupils are equal, round, and reactive to light. EOM are normal.  Pulmonary/Chest: Effort normal.  Neurological: She is  alert and oriented to person, place, and time.  Skin: Skin is warm and dry.  Psychiatric: She has a normal mood and affect. Her behavior is normal.    Ortho Exam awake alert and oriented x3.  Comfortable sitting.  Straight leg raise negative bilaterally.  Painless range of motion both hips.  No pain over the lateral aspect of her right hip.  Some mild tenderness in the right paralumbar region.  No flank pain.  Neurologically intact Specialty Comments:  No specialty comments available.  Imaging: No results found.   PMFS History: Patient Active Problem List   Diagnosis Date Noted  . Chronic right-sided low back pain without sciatica 04/17/2018  . History of CVA (cerebrovascular accident) 03/14/2017  . Gastritis   . BPPV (benign paroxysmal positional vertigo) 03/24/2015  . Right hip pain 01/07/2015  . Absolute anemia 01/06/2015  . BMI 28.0-28.9,adult 01/06/2015  . Cervical disc disease 01/06/2015  . Atypical chest pain 01/06/2015  . Generalized pruritus 01/06/2015  . Acid reflux 01/06/2015  . Cervicogenic headache 01/06/2015  . Hypercholesteremia 01/06/2015  . Low back pain 01/06/2015  . Pseudoarthrosis of cervical spine ([place]) 08/26/2013  . Accumulation of fluid in tissues 12/24/2009  . Avitaminosis D 10/15/2009  . Cardiac conduction disorder 09/30/2008  . Essential (primary) hypertension 09/30/2008  . Blood in the urine 09/30/2008   Past Medical History:  Diagnosis Date  . Allergy   .  Anemia    takes Ferrous Sulfate daily  . Arthritis   . GERD (gastroesophageal reflux disease)    takes OTC med  . Hyperlipidemia   . Hypertension    takes Amlodipine daily  . Low back pain    occasionally    Family History  Problem Relation Age of Onset  . Hypertension Mother   . Heart disease Mother   . Breast cancer Neg Hx     Past Surgical History:  Procedure Laterality Date  . ANTERIOR CERVICAL DECOMP/DISCECTOMY FUSION  10/19/2011   Procedure: ANTERIOR CERVICAL  DECOMPRESSION/DISCECTOMY FUSION 1 LEVEL/HARDWARE REMOVAL;  Surgeon: Elaina Hoops, MD;  Location: [facility];  Service: Neurosurgery;  Laterality: N/A;  Cervical four - five  Anterior cervical decompression fusion with redo at six - seven  Rm 33  . CARPAL TUNNEL RELEASE    . COLONOSCOPY  08/2014   polyps  . ESOPHAGOGASTRODUODENOSCOPY (EGD) WITH PROPOFOL N/A 06/23/2015   Procedure: ESOPHAGOGASTRODUODENOSCOPY (EGD) WITH PROPOFOL;  Surgeon: [name], MD;  Location: [facility];  Service: Endoscopy;  Laterality: N/A;  . NECK SURGERY    . POSTERIOR CERVICAL FUSION/FORAMINOTOMY N/A 08/26/2013   Procedure: C/4-5,C/5-6,C/6-7 Posterior Cervical Fusion w/lateral mass fixation;  Surgeon: Elaina Hoops, MD;  Location: [facility];  Service: Neurosurgery;  Laterality: N/A;  . TUBAL LIGATION     Social History   Occupational History  . Not on file  Tobacco Use  . Smoking status: Never Smoker  . Smokeless tobacco: Never Used  Substance and Sexual Activity  . Alcohol use: No  . Drug use: No  . Sexual activity: Not on file     Garald Balding, MD   Note - This record has been created using [employer].  Chart creation errors have been sought, but may not always  have been located. Such creation errors do not reflect on  the standard of medical care.

## 2018-04-21 ENCOUNTER — Other Ambulatory Visit: Payer: Self-pay | Admitting: Physician Assistant

## 2018-04-21 DIAGNOSIS — R1013 Epigastric pain: Secondary | ICD-10-CM

## 2018-05-09 ENCOUNTER — Other Ambulatory Visit: Payer: Self-pay

## 2018-05-09 ENCOUNTER — Ambulatory Visit: Payer: Medicare HMO | Attending: Orthopaedic Surgery

## 2018-05-09 DIAGNOSIS — G8929 Other chronic pain: Secondary | ICD-10-CM | POA: Diagnosis not present

## 2018-05-09 DIAGNOSIS — M5417 Radiculopathy, lumbosacral region: Secondary | ICD-10-CM | POA: Diagnosis not present

## 2018-05-09 DIAGNOSIS — M6281 Muscle weakness (generalized): Secondary | ICD-10-CM | POA: Insufficient documentation

## 2018-05-09 DIAGNOSIS — M545 Low back pain: Secondary | ICD-10-CM | POA: Diagnosis not present

## 2018-05-09 NOTE — Patient Instructions (Addendum)
Prone on elbows   Lie down onto your stomach on your bed and prop yourself up onto your elbows   Keep your toes turned in   Take deep breaths for 2 minutes comfortably     Repeat at least 3 times a day.   Medbridge Access Code: [email or web address]    Lateral Shift Correction at Wall   10x5 seconds for 2 sets daily.

## 2018-05-09 NOTE — Therapy (Signed)
[facility] [address] [place], [address] Phone: [phone]   Fax:  [phone]  Physical Therapy Evaluation  Patient Details  Name: [name] MRN: [id number] Date of Birth: Oct 03, 1955 Referring Provider (PT): Joni Fears, MD   Encounter Date: 05/09/2018  PT End of Session - 05/09/18 0843    Visit Number  1    Number of Visits  13    Date for PT Re-Evaluation  06/21/18    Authorization Type  1    Authorization Time Period  of 10 progress report    PT Start Time  0844    PT Stop Time  0947    PT Time Calculation (min)  63 min    Activity Tolerance  Patient tolerated treatment well    Behavior During Therapy  [facility] for tasks assessed/performed       Past Medical History:  Diagnosis Date  . Allergy   . Anemia    takes Ferrous Sulfate daily  . Arthritis   . GERD (gastroesophageal reflux disease)    takes OTC med  . Hyperlipidemia   . Hypertension    takes Amlodipine daily  . Low back pain    occasionally    Past Surgical History:  Procedure Laterality Date  . ANTERIOR CERVICAL DECOMP/DISCECTOMY FUSION  10/19/2011   Procedure: ANTERIOR CERVICAL DECOMPRESSION/DISCECTOMY FUSION 1 LEVEL/HARDWARE REMOVAL;  Surgeon: Elaina Hoops, MD;  Location: [facility];  Service: Neurosurgery;  Laterality: N/A;  Cervical four - five  Anterior cervical decompression fusion with redo at six - [age]  Rm 33  . CARPAL TUNNEL RELEASE    . COLONOSCOPY  08/2014   polyps  . ESOPHAGOGASTRODUODENOSCOPY (EGD) WITH PROPOFOL N/A 06/23/2015   Procedure: ESOPHAGOGASTRODUODENOSCOPY (EGD) WITH PROPOFOL;  Surgeon: [name], MD;  Location: [facility] ENDOSCOPY;  Service: Endoscopy;  Laterality: N/A;  . NECK SURGERY    . POSTERIOR CERVICAL FUSION/FORAMINOTOMY N/A 08/26/2013   Procedure: C/4-5,C/5-6,C/6-7 Posterior Cervical Fusion w/lateral mass fixation;  Surgeon: Elaina Hoops, MD;  Location: [facility];  Service:  Neurosurgery;  Laterality: N/A;  . TUBAL LIGATION      There were no vitals filed for this visit.   Subjective Assessment - 05/09/18 0849    Subjective  Lumbar back pain: 9/10 currently (pt sitting, does not appear in distress), 10/10 at worst for the past month, 5/10 at best for the past month (if pt is not really doing anything.     Pertinent History  Chronic back pain with bilateral sciatica. Back pain started about 3-4 months ago. R leg bothered her during the earlier part of this year. States having arthritis in her R LE. Pain goes down along the R L5 dermatome to her foot.  No symptoms on her L LE.  Pt states having swelling R anterior hip joint area around March 2019.  Had an ultrasound, CT scan of her hip which were negative. Had and MRI for her back.      Patient Stated Goals  Make her feel better, not so much pain. Be able to sit and braid her great granddaughter's hair more comfortably.     Currently in Pain?  Yes    Pain Score  9     Pain Location  Back    Pain Orientation  Right;Lower;Mid    Pain Descriptors / Indicators  Sharp   sharp pain going down her leg   Pain Type  Chronic pain  Pain Onset  More than a month ago    Pain Frequency  Occasional    Aggravating Factors   Increased activities, sitting and braiding her great granddaughter's hair, standing for about 30 min at most, bending over. Driving for a long time  (about 2-3 hours)     Pain Relieving Factors  Walking around the block, laying flat on her back and on her stomach (sleeps on her stomach). Tylenol          [facility] PT Assessment - 05/09/18 0902      Assessment   Medical Diagnosis  Chronic bilateral low back pain with bilateral sciatica    Referring Provider (PT)  Joni Fears, MD    Onset Date/Surgical Date  04/17/18   date PT referral signed   Prior Therapy  No known PT for current condition.       Precautions   Precaution Comments  No known back precautions      Restrictions   Other  Position/Activity Restrictions  No known restrictions      Balance Screen   Has the patient fallen in the past 6 months  No    Has the patient had a decrease in activity level because of a fear of falling?   No    Is the patient reluctant to leave their home because of a fear of falling?   No      Home Environment   Additional Comments  Pt lives in a 1 story home with her son and her son's significant other and baby. 3 steps to enter with L rail assist.       Prior Function   Leisure  play games on her phone      Observation/Other Assessments   Observations  (+) Slump R LE (for L5 dermatome). (+) Slump for L LE for L sciatic nerve (along calf muscle area).     Focus on Therapeutic Outcomes (FOTO)   Lumbar spine FOTO 63    Modified [place]  28%      Posture/Postural Control   Posture Comments  protracted neck, bilaterally protracted shoulders, R shoulder lower, R knee and greater trochanter slightly higher.  Slight R trunk side bend/R lateral shift posture. R hip in slight ER      AROM   Lumbar Flexion  WFL with lumbar back pain.    felt better compared to extension   Lumbar Extension  WFL with low back pain around L5/S1. Slight R trunk rotation.     Lumbar - Right Side [facility] with L trunk stretch    Lumbar - Left Side Bend  WFL wiht R trunk stretch    Lumbar - Right Rotation  WFL with back tightness    Lumbar - Left Rotation  WFL with back tightness      Strength   Right Hip Flexion  4-/5    Right Hip Extension  3-/5   with hamstring cramp   Right Hip ABduction  4-/5    Left Hip Flexion  4-/5    Left Hip Extension  3+/5   with hamstring cramp   Left Hip ABduction  4/5    Right Knee Flexion  4+/5    Right Knee Extension  5/5    Left Knee Flexion  4/5    Left Knee Extension  5/5      Palpation   Palpation comment  TTP R L5, L4, L3, L1 transverse process. R lumbar transverse processes more palpable than L. Slight stiffness  mid thoracic spine with central P to A.        Ambulation/Gait   Gait Comments  Slight decreased stance L LE. Decreased trunk rotation                 Objective measurements completed on examination: See above findings.     Pt states that her blood pressure is controlled.  No hx of back surgeries. Denies hx of CA or osteoporosis, no pacemakers or defibrillators  Pt states current pain level is 9/10. It's not real bad pain.     Medbridge Access Code: [email or web address]    Therapeutic exercise  Sitting with lumbar towel roll 3 minutes (2 min no charge). No change in back pain  prone on elbows 2 min with toes turned in, and deep breaths for 2 sets.   Standing R lateral shift correction 10x5 seconds for 2 sets  Reviewed HEP. Pt demonstrated and verbalized understanding.     Improved exercise technique, movement at target joints, use of target muscles after mod verbal, visual, tactile cues.   Decreased back pain with extension exercises.    Patient is a 62 year old female who came to physical therapy secondary to back pain with R LE radiating symptoms. She also demonstrates extension preference > flexion preference, reproduction of symptoms with trunk flexion and extension, bilateral hip weakness, altered posture and gait pattern, TTP to low back, and difficulty performing functional tasks such as driving, sitting and braiding her granddaughter's hair, and tolerating positions such as standing due to pain. Patient will benefit from skilled physical therapy services to address the aforementioned deficits.      PT Education - 05/09/18 1700    Education Details  ther-ex, HEP, plan of care    Person(s) Educated  Patient    Methods  Explanation;Demonstration;Tactile cues;Verbal cues;Handout    Comprehension  Returned demonstration;Verbalized understanding       PT Short Term Goals - 05/09/18 1302      PT SHORT TERM GOAL #1   Title  Patient will be independent with her HEP to decrease pain and improve function.     Baseline   Pt has started her HEP (05/09/2018)    Time  3    Period  Weeks    Status  New    Target Date  05/31/18        PT Long Term Goals - 05/09/18 1303      PT LONG TERM GOAL #1   Title  Patient will improve bilateral hip extension and abduction strength by at least 1/2 MMT grade to help decrease back and LE pain, and improve ability to perform standing tasks more comfortably.     Time  6    Period  Weeks    Status  New    Target Date  06/21/18      PT LONG TERM GOAL #2   Title  Patient will have a decrease in back pain to 6/10 or less at worst to promote ability to perform sitting and standing tasks more comfortably.     Baseline  10/10 back pain at most for the past month (05/09/2018)    Time  6    Period  Weeks    Status  New    Target Date  06/21/18      PT LONG TERM GOAL #3   Title  Patient will improve her FOTO score by at least 10 points as a demonstration of improved function.     Baseline  Lumbar spine FOTO 63 (05/09/2018)    Time  6    Period  Weeks    Status  New    Target Date  06/21/18      PT LONG TERM GOAL #4   Title  Patient will improve her Modified Oswestry score by at least 10% as a demonstration of improved function.     Baseline  28% (05/09/18)    Time  6    Period  Weeks    Status  New    Target Date  06/21/18             Plan - 05/09/18 1238    Clinical Impression Statement  Patient is a 62 year old female who came to physical therapy secondary to back pain with R LE radiating symptoms. She also demonstrates extension preference > flexion preference, reproduction of symptoms with trunk flexion and extension, bilateral hip weakness, altered posture and gait pattern, TTP to low back, and difficulty performing functional tasks such as driving, sitting and braiding her granddaughter's hair, and tolerating positions such as standing due to pain. Patient will benefit from skilled physical therapy services to address the aforementioned deficits.      History and Personal Factors relevant to plan of care:  Age, back pain, R LE pain, hip weakness, difficulty with sitting and standing tasks.     Clinical Presentation  Stable    Clinical Presentation due to:  pain does not seem like it has improved based on subjective reports    Clinical Decision Making  Low    Rehab Potential  Fair    Clinical Impairments Affecting Rehab Potential  age, back and LE pain, hip weakness    PT Frequency  2x / week    PT Duration  6 weeks    PT Treatment/Interventions  Therapeutic activities;Therapeutic exercise;Neuromuscular re-education;Patient/family education;Manual techniques;Dry needling;Aquatic Therapy;Electrical Stimulation;Iontophoresis 4mg /ml Dexamethasone;Ultrasound;Traction   traction if appropriate   PT Next Visit Plan  gentle thoracic extension, gentle lumbar extension, trunk, hip, core strengthening. Manual techniques, modalities PRN    Consulted and Agree with Plan of Care  Patient       Patient will benefit from skilled therapeutic intervention in order to improve the following deficits and impairments:  Pain, Postural dysfunction, Improper body mechanics, Decreased strength  Visit Diagnosis: Chronic bilateral low back pain, unspecified whether sciatica present - Plan: PT plan of care cert/re-cert  Radiculopathy, lumbosacral region - Plan: PT plan of care cert/re-cert  Muscle weakness (generalized) - Plan: PT plan of care cert/re-cert     Problem List Patient Active Problem List   Diagnosis Date Noted  . Chronic right-sided low back pain without sciatica 04/17/2018  . History of CVA (cerebrovascular accident) 03/14/2017  . Gastritis   . BPPV (benign paroxysmal positional vertigo) 03/24/2015  . Right hip pain 01/07/2015  . Absolute anemia 01/06/2015  . BMI 28.0-28.9,adult 01/06/2015  . Cervical disc disease 01/06/2015  . Atypical chest pain 01/06/2015  . Generalized pruritus 01/06/2015  . Acid reflux 01/06/2015  . Cervicogenic  headache 01/06/2015  . Hypercholesteremia 01/06/2015  . Low back pain 01/06/2015  . Pseudoarthrosis of cervical spine ([place]) 08/26/2013  . Accumulation of fluid in tissues 12/24/2009  . Avitaminosis D 10/15/2009  . Cardiac conduction disorder 09/30/2008  . Essential (primary) hypertension 09/30/2008  . Blood in the urine 09/30/2008   Joneen Boers PT, DPT   05/09/2018, 5:11 PM  [facility] [address],  [place], [address] Phone: [phone]   Fax:  [phone]  Name: [name] MRN: [id number] Date of Birth: 11/22/1955

## 2018-05-15 ENCOUNTER — Ambulatory Visit: Payer: Medicare HMO

## 2018-05-17 ENCOUNTER — Telehealth: Payer: Self-pay

## 2018-05-17 NOTE — Telephone Encounter (Signed)
No show. Called patient and left a message pertaining to appointment and a reminder for the next follow up session. Return phone call requested. Phone number ([phone]) provided.   

## 2018-05-22 ENCOUNTER — Ambulatory Visit: Payer: Medicare HMO

## 2018-05-22 DIAGNOSIS — M545 Low back pain, unspecified: Secondary | ICD-10-CM

## 2018-05-22 DIAGNOSIS — M6281 Muscle weakness (generalized): Secondary | ICD-10-CM | POA: Diagnosis not present

## 2018-05-22 DIAGNOSIS — M5417 Radiculopathy, lumbosacral region: Secondary | ICD-10-CM | POA: Diagnosis not present

## 2018-05-22 DIAGNOSIS — G8929 Other chronic pain: Secondary | ICD-10-CM | POA: Diagnosis not present

## 2018-05-22 NOTE — Therapy (Signed)
[facility] [address], [place], [address] Phone: [phone]   Fax:  [phone]  Physical Therapy Treatment  Patient Details  Name: [name] MRN: [id number] Date of Birth: 1956-05-16 Referring Provider (PT): Joni Fears, MD   Encounter Date: 05/22/2018  PT End of Session - 05/22/18 1035    Visit Number  2    Number of Visits  13    Date for PT Re-Evaluation  06/21/18    Authorization Type  2    Authorization Time Period  of 10 progress report    PT Start Time  1036    PT Stop Time  1117    PT Time Calculation (min)  41 min    Activity Tolerance  Patient tolerated treatment well    Behavior During Therapy  [facility] for tasks assessed/performed       Past Medical History:  Diagnosis Date  . Allergy   . Anemia    takes Ferrous Sulfate daily  . Arthritis   . GERD (gastroesophageal reflux disease)    takes OTC med  . Hyperlipidemia   . Hypertension    takes Amlodipine daily  . Low back pain    occasionally    Past Surgical History:  Procedure Laterality Date  . ANTERIOR CERVICAL DECOMP/DISCECTOMY FUSION  10/19/2011   Procedure: ANTERIOR CERVICAL DECOMPRESSION/DISCECTOMY FUSION 1 LEVEL/HARDWARE REMOVAL;  Surgeon: Elaina Hoops, MD;  Location: [facility];  Service: Neurosurgery;  Laterality: N/A;  Cervical four - five  Anterior cervical decompression fusion with redo at six - [age]  Rm 33  . CARPAL TUNNEL RELEASE    . COLONOSCOPY  08/2014   polyps  . ESOPHAGOGASTRODUODENOSCOPY (EGD) WITH PROPOFOL N/A 06/23/2015   Procedure: ESOPHAGOGASTRODUODENOSCOPY (EGD) WITH PROPOFOL;  Surgeon: [name], MD;  Location: [facility] ENDOSCOPY;  Service: Endoscopy;  Laterality: N/A;  . NECK SURGERY    . POSTERIOR CERVICAL FUSION/FORAMINOTOMY N/A 08/26/2013   Procedure: C/4-5,C/5-6,C/6-7 Posterior Cervical Fusion w/lateral mass fixation;  Surgeon: Elaina Hoops, MD;  Location: [facility];  Service:  Neurosurgery;  Laterality: N/A;  . TUBAL LIGATION      There were no vitals filed for this visit.  Subjective Assessment - 05/22/18 1038    Subjective  Back is ok right now.  Has not been doing a whole lot of activities this morning. Back feels better when sitting up straight compared to slouching when braiding her great granddaughter's hair.     Pertinent History  Chronic back pain with bilateral sciatica. Back pain started about 3-4 months ago. R leg bothered her during the earlier part of this year. States having arthritis in her R LE. Pain goes down along the R L5 dermatome to her foot.  No symptoms on her L LE.  Pt states having swelling R anterior hip joint area around March 2019.  Had an ultrasound, CT scan of her hip which were negative. Had and MRI for her back.      Patient Stated Goals  Make her feel better, not so much pain. Be able to sit and braid her great granddaughter's hair more comfortably.     Currently in Pain?  No/denies    Pain Onset  More than a month ago                               PT Education - 05/22/18 1058    Education  Details  ther-ex    Person(s) Educated  Patient    Methods  Explanation;Demonstration;Tactile cues;Verbal cues    Comprehension  Returned demonstration;Verbalized understanding      Objective      Pt states that her blood pressure is controlled.  No hx of back surgeries. Denies hx of CA or osteoporosis, no pacemakers or defibrillators No latex band allergies   Medbridge Access Code: [email or web address]    Therapeutic exercise  Standing R lateral shift correction 10x5 seconds for 2 sets  Standing B shoulder extension resisting red band 10x2 with 5 second holds   Seated press-ups 4x 2 seconds. L posterior arm pull, eases with rest.   Seated with proper posture   Manual perturpation from PT 1 min x 3  Manually resisted trunk flexion 10x5 second for 3 sets  Standing hip extension resisting yellow band wih B  UE assist 10x  Side stepping 5 ft to the R and 5 ft to the L  5x  Standing LE leg press resisting blue band with B UE assist 10x2 each LE   Improved exercise technique, movement at target joints, use of target muscles after min to mod verbal, visual, tactile cues.   Continued working on gentle lumbar extension due to extension > flexion preference. Also worked on trunk and glute strengthening to help decrease too much extension pressure on her back. Pt tolerated session well without aggravation of symptoms. Pt will benefit from continued skilled physical therapy services to decrease pain and improve function.      PT Short Term Goals - 05/09/18 1302      PT SHORT TERM GOAL #1   Title  Patient will be independent with her HEP to decrease pain and improve function.     Baseline  Pt has started her HEP (05/09/2018)    Time  3    Period  Weeks    Status  New    Target Date  05/31/18        PT Long Term Goals - 05/09/18 1303      PT LONG TERM GOAL #1   Title  Patient will improve bilateral hip extension and abduction strength by at least 1/2 MMT grade to help decrease back and LE pain, and improve ability to perform standing tasks more comfortably.     Time  6    Period  Weeks    Status  New    Target Date  06/21/18      PT LONG TERM GOAL #2   Title  Patient will have a decrease in back pain to 6/10 or less at worst to promote ability to perform sitting and standing tasks more comfortably.     Baseline  10/10 back pain at most for the past month (05/09/2018)    Time  6    Period  Weeks    Status  New    Target Date  06/21/18      PT LONG TERM GOAL #3   Title  Patient will improve her FOTO score by at least 10 points as a demonstration of improved function.     Baseline  Lumbar spine FOTO 63 (05/09/2018)    Time  6    Period  Weeks    Status  New    Target Date  06/21/18      PT LONG TERM GOAL #4   Title  Patient will improve her Modified Oswestry score by at least 10%  as a demonstration of improved function.  Baseline  28% (05/09/18)    Time  6    Period  Weeks    Status  New    Target Date  06/21/18            Plan - 05/22/18 1059    Clinical Impression Statement  Continued working on gentle lumbar extension due to extension > flexion preference. Also worked on trunk and glute strengthening to help decrease too much extension pressure on her back. Pt tolerated session well without aggravation of symptoms. Pt will benefit from continued skilled physical therapy services to decrease pain and improve function.      Rehab Potential  Fair    Clinical Impairments Affecting Rehab Potential  age, back and LE pain, hip weakness    PT Frequency  2x / week    PT Duration  6 weeks    PT Treatment/Interventions  Therapeutic activities;Therapeutic exercise;Neuromuscular re-education;Patient/family education;Manual techniques;Dry needling;Aquatic Therapy;Electrical Stimulation;Iontophoresis 4mg /ml Dexamethasone;Ultrasound;Traction   traction if appropriate   PT Next Visit Plan  gentle thoracic extension, gentle lumbar extension, trunk, hip, core strengthening. Manual techniques, modalities PRN    Consulted and Agree with Plan of Care  Patient       Patient will benefit from skilled therapeutic intervention in order to improve the following deficits and impairments:  Pain, Postural dysfunction, Improper body mechanics, Decreased strength  Visit Diagnosis: Chronic bilateral low back pain, unspecified whether sciatica present  Radiculopathy, lumbosacral region  Muscle weakness (generalized)     Problem List Patient Active Problem List   Diagnosis Date Noted  . Chronic right-sided low back pain without sciatica 04/17/2018  . History of CVA (cerebrovascular accident) 03/14/2017  . Gastritis   . BPPV (benign paroxysmal positional vertigo) 03/24/2015  . Right hip pain 01/07/2015  . Absolute anemia 01/06/2015  . BMI 28.0-28.9,adult 01/06/2015  .  Cervical disc disease 01/06/2015  . Atypical chest pain 01/06/2015  . Generalized pruritus 01/06/2015  . Acid reflux 01/06/2015  . Cervicogenic headache 01/06/2015  . Hypercholesteremia 01/06/2015  . Low back pain 01/06/2015  . Pseudoarthrosis of cervical spine ([place]) 08/26/2013  . Accumulation of fluid in tissues 12/24/2009  . Avitaminosis D 10/15/2009  . Cardiac conduction disorder 09/30/2008  . Essential (primary) hypertension 09/30/2008  . Blood in the urine 09/30/2008   Joneen Boers PT, DPT   05/22/2018, 12:21 PM  [facility] [address], [place], [address] Phone: [phone]   Fax:  [phone]  Name: [name] MRN: [id number] Date of Birth: 02-27-1956

## 2018-05-24 ENCOUNTER — Ambulatory Visit: Payer: Medicare HMO

## 2018-05-24 DIAGNOSIS — M545 Low back pain: Principal | ICD-10-CM

## 2018-05-24 DIAGNOSIS — M5417 Radiculopathy, lumbosacral region: Secondary | ICD-10-CM

## 2018-05-24 DIAGNOSIS — M6281 Muscle weakness (generalized): Secondary | ICD-10-CM

## 2018-05-24 DIAGNOSIS — G8929 Other chronic pain: Secondary | ICD-10-CM | POA: Diagnosis not present

## 2018-05-24 NOTE — Therapy (Signed)
[facility] [address], [place], [address] Phone: [phone]   Fax:  [phone]  Physical Therapy Treatment  Patient Details  Name: [name] MRN: [id number] Date of Birth: Jun 07, 1956 Referring Provider (PT): Joni Fears, MD   Encounter Date: 05/24/2018  PT End of Session - 05/24/18 0907    Visit Number  3    Number of Visits  13    Date for PT Re-Evaluation  06/21/18    Authorization Type  3    Authorization Time Period  of 10 progress report    PT Start Time  0907    PT Stop Time  0949    PT Time Calculation (min)  42 min    Activity Tolerance  Patient tolerated treatment well    Behavior During Therapy  [facility] for tasks assessed/performed       Past Medical History:  Diagnosis Date  . Allergy   . Anemia    takes Ferrous Sulfate daily  . Arthritis   . GERD (gastroesophageal reflux disease)    takes OTC med  . Hyperlipidemia   . Hypertension    takes Amlodipine daily  . Low back pain    occasionally    Past Surgical History:  Procedure Laterality Date  . ANTERIOR CERVICAL DECOMP/DISCECTOMY FUSION  10/19/2011   Procedure: ANTERIOR CERVICAL DECOMPRESSION/DISCECTOMY FUSION 1 LEVEL/HARDWARE REMOVAL;  Surgeon: Elaina Hoops, MD;  Location: [place] [facility];  Service: Neurosurgery;  Laterality: N/A;  Cervical four - five  Anterior cervical decompression fusion with redo at six - seven  Rm 33  . CARPAL TUNNEL RELEASE    . COLONOSCOPY  08/2014   polyps  . ESOPHAGOGASTRODUODENOSCOPY (EGD) WITH PROPOFOL N/A 06/23/2015   Procedure: ESOPHAGOGASTRODUODENOSCOPY (EGD) WITH PROPOFOL;  Surgeon: [name], MD;  Location: [facility] ENDOSCOPY;  Service: Endoscopy;  Laterality: N/A;  . NECK SURGERY    . POSTERIOR CERVICAL FUSION/FORAMINOTOMY N/A 08/26/2013   Procedure: C/4-5,C/5-6,C/6-7 Posterior Cervical Fusion w/lateral mass fixation;  Surgeon: Elaina Hoops, MD;  Location: [place] [facility];  Service:  Neurosurgery;  Laterality: N/A;  . TUBAL LIGATION      There were no vitals filed for this visit.  Subjective Assessment - 05/24/18 0908    Subjective  Feels sore. Just a little back pain, not much, 5/10 currently.     Pertinent History  Chronic back pain with bilateral sciatica. Back pain started about 3-4 months ago. R leg bothered her during the earlier part of this year. States having arthritis in her R LE. Pain goes down along the R L5 dermatome to her foot.  No symptoms on her L LE.  Pt states having swelling R anterior hip joint area around March 2019.  Had an ultrasound, CT scan of her hip which were negative. Had and MRI for her back.      Patient Stated Goals  Make her feel better, not so much pain. Be able to sit and braid her great granddaughter's hair more comfortably.     Currently in Pain?  Yes    Pain Score  5     Pain Location  Back    Pain Onset  More than a month ago                               PT Education - 05/24/18 0913    Education Details  ther-ex    Person(s)  Educated  Patient    Methods  Explanation;Demonstration;Tactile cues;Verbal cues    Comprehension  Returned demonstration;Verbalized understanding        Objective   Pt states that her blood pressure is controlled.  No hx of back surgeries. Denies hx of CA or osteoporosis, no pacemakers or defibrillators No latex band allergies   MedbridgeAccess Code: [email or web address]   Therapeutic exercise  Standing rows resisting red band 10x3 with 5 second holds   Sitting with manually resisted trunk flexion 10x5 with 5 seconds  Seated bilateral shoulder extension isometrics, hands on thighs 10x3 with 5 second holds  Prone glute max/quad set 10x3 with 5 seconds each LE  Reviewed and given as part of her HEP. Pt demonstrated and verbalized understanding. Handout provided.   Standing LE leg press resisting blue band with B UE assist 10x2 each LE   Improved exercise technique,  movement at target joints, use of target muscles after min to mod verbal, visual, tactile cues.     Manual therapy   Prone STM to R lumbar paraspinal muscle   Decreased low back discomfort   Pt demonstrates overall decreased back pain at start of session compared to eval. Continued working on gentle low back and thoracic extension, trunk and glute strengthening to help decrease back pain.         PT Short Term Goals - 05/09/18 1302      PT SHORT TERM GOAL #1   Title  Patient will be independent with her HEP to decrease pain and improve function.     Baseline  Pt has started her HEP (05/09/2018)    Time  3    Period  Weeks    Status  New    Target Date  05/31/18        PT Long Term Goals - 05/09/18 1303      PT LONG TERM GOAL #1   Title  Patient will improve bilateral hip extension and abduction strength by at least 1/2 MMT grade to help decrease back and LE pain, and improve ability to perform standing tasks more comfortably.     Time  6    Period  Weeks    Status  New    Target Date  06/21/18      PT LONG TERM GOAL #2   Title  Patient will have a decrease in back pain to 6/10 or less at worst to promote ability to perform sitting and standing tasks more comfortably.     Baseline  10/10 back pain at most for the past month (05/09/2018)    Time  6    Period  Weeks    Status  New    Target Date  06/21/18      PT LONG TERM GOAL #3   Title  Patient will improve her FOTO score by at least 10 points as a demonstration of improved function.     Baseline  Lumbar spine FOTO 63 (05/09/2018)    Time  6    Period  Weeks    Status  New    Target Date  06/21/18      PT LONG TERM GOAL #4   Title  Patient will improve her Modified Oswestry score by at least 10% as a demonstration of improved function.     Baseline  28% (05/09/18)    Time  6    Period  Weeks    Status  New    Target Date  06/21/18  Plan - 05/24/18 0913    Clinical Impression Statement   Pt demonstrates overall decreased back pain at start of session compared to eval. Continued working on gentle low back and thoracic extension, trunk and glute strengthening to help decrease back pain.      Rehab Potential  Fair    Clinical Impairments Affecting Rehab Potential  age, back and LE pain, hip weakness    PT Frequency  2x / week    PT Duration  6 weeks    PT Treatment/Interventions  Therapeutic activities;Therapeutic exercise;Neuromuscular re-education;Patient/family education;Manual techniques;Dry needling;Aquatic Therapy;Electrical Stimulation;Iontophoresis 4mg /ml Dexamethasone;Ultrasound;Traction   traction if appropriate   PT Next Visit Plan  gentle thoracic extension, gentle lumbar extension, trunk, hip, core strengthening. Manual techniques, modalities PRN    Consulted and Agree with Plan of Care  Patient       Patient will benefit from skilled therapeutic intervention in order to improve the following deficits and impairments:  Pain, Postural dysfunction, Improper body mechanics, Decreased strength  Visit Diagnosis: Chronic bilateral low back pain, unspecified whether sciatica present  Radiculopathy, lumbosacral region  Muscle weakness (generalized)     Problem List Patient Active Problem List   Diagnosis Date Noted  . Chronic right-sided low back pain without sciatica 04/17/2018  . History of CVA (cerebrovascular accident) 03/14/2017  . Gastritis   . BPPV (benign paroxysmal positional vertigo) 03/24/2015  . Right hip pain 01/07/2015  . Absolute anemia 01/06/2015  . BMI 28.0-28.9,adult 01/06/2015  . Cervical disc disease 01/06/2015  . Atypical chest pain 01/06/2015  . Generalized pruritus 01/06/2015  . Acid reflux 01/06/2015  . Cervicogenic headache 01/06/2015  . Hypercholesteremia 01/06/2015  . Low back pain 01/06/2015  . Pseudoarthrosis of cervical spine ([place]) 08/26/2013  . Accumulation of fluid in tissues 12/24/2009  . Avitaminosis D 10/15/2009  .  Cardiac conduction disorder 09/30/2008  . Essential (primary) hypertension 09/30/2008  . Blood in the urine 09/30/2008    Joneen Boers PT, DPT   05/24/2018, 10:11 AM  [place] [address] [place], [address] Phone: [phone]   Fax:  [phone]  Name: RAMLA HASE MRN: [id number] Date of Birth: 03-Mar-1956

## 2018-05-24 NOTE — Patient Instructions (Signed)
   Quadriceps Set (Prone)   With toes supporting lower legs, squeeze rear end muscles and tighten thigh muscles to straighten knees. Hold _5___ seconds. Relax. Repeat __10__ times per set. Do ___3_ sets per session. Do ___1_ sessions per day.  http://orth.exer.us/726   Copyright  VHI. All rights reserved.

## 2018-05-29 ENCOUNTER — Ambulatory Visit: Payer: Medicare HMO

## 2018-05-31 ENCOUNTER — Ambulatory Visit: Payer: Medicare HMO | Attending: Orthopaedic Surgery

## 2018-05-31 DIAGNOSIS — M6281 Muscle weakness (generalized): Secondary | ICD-10-CM | POA: Insufficient documentation

## 2018-05-31 DIAGNOSIS — M5417 Radiculopathy, lumbosacral region: Secondary | ICD-10-CM | POA: Diagnosis not present

## 2018-05-31 DIAGNOSIS — M545 Low back pain: Secondary | ICD-10-CM | POA: Insufficient documentation

## 2018-05-31 DIAGNOSIS — G8929 Other chronic pain: Secondary | ICD-10-CM | POA: Insufficient documentation

## 2018-05-31 NOTE — Therapy (Signed)
[facility] [facility] [address] [place], [address] Phone: [phone]   Fax:  [phone]  Physical Therapy Treatment  Patient Details  Name: [name] MRN: [id number] Date of Birth: 02-Nov-1955 Referring Provider (PT): Joni Fears, MD   Encounter Date: 05/31/2018  PT End of Session - 05/31/18 0951    Visit Number  4    Number of Visits  13    Date for PT Re-Evaluation  06/21/18    Authorization Type  4    Authorization Time Period  of 10 progress report    PT Start Time  0951    PT Stop Time  1033    PT Time Calculation (min)  42 min    Activity Tolerance  Patient tolerated treatment well    Behavior During Therapy  [facility] for tasks assessed/performed       Past Medical History:  Diagnosis Date  . Allergy   . Anemia    takes Ferrous Sulfate daily  . Arthritis   . GERD (gastroesophageal reflux disease)    takes OTC med  . Hyperlipidemia   . Hypertension    takes Amlodipine daily  . Low back pain    occasionally    Past Surgical History:  Procedure Laterality Date  . ANTERIOR CERVICAL DECOMP/DISCECTOMY FUSION  10/19/2011   Procedure: ANTERIOR CERVICAL DECOMPRESSION/DISCECTOMY FUSION 1 LEVEL/HARDWARE REMOVAL;  Surgeon: Elaina Hoops, MD;  Location: [place] [facility];  Service: Neurosurgery;  Laterality: N/A;  Cervical four - five  Anterior cervical decompression fusion with redo at six - seven  Rm 33  . CARPAL TUNNEL RELEASE    . COLONOSCOPY  08/2014   polyps  . ESOPHAGOGASTRODUODENOSCOPY (EGD) WITH PROPOFOL N/A 06/23/2015   Procedure: ESOPHAGOGASTRODUODENOSCOPY (EGD) WITH PROPOFOL;  Surgeon: [name], MD;  Location: [facility] ENDOSCOPY;  Service: Endoscopy;  Laterality: N/A;  . NECK SURGERY    . POSTERIOR CERVICAL FUSION/FORAMINOTOMY N/A 08/26/2013   Procedure: C/4-5,C/5-6,C/6-7 Posterior Cervical Fusion w/lateral mass fixation;  Surgeon: Elaina Hoops, MD;  Location: [place] [facility];  Service: Neurosurgery;   Laterality: N/A;  . TUBAL LIGATION      There were no vitals filed for this visit.  Subjective Assessment - 05/31/18 0952    Subjective  Back is doing alright. No pain or discomfort.     Pertinent History  Chronic back pain with bilateral sciatica. Back pain started about 3-4 months ago. R leg bothered her during the earlier part of this year. States having arthritis in her R LE. Pain goes down along the R L5 dermatome to her foot.  No symptoms on her L LE.  Pt states having swelling R anterior hip joint area around March 2019.  Had an ultrasound, CT scan of her hip which were negative. Had and MRI for her back.      Patient Stated Goals  Make her feel better, not so much pain. Be able to sit and braid her great granddaughter's hair more comfortably.     Currently in Pain?  No/denies    Pain Score  0-No pain    Pain Onset  More than a month ago                               PT Education - 05/31/18 0956    Education Details  ther-ex    Person(s) Educated  Patient    Methods  Explanation;Demonstration;Tactile cues;Verbal  cues    Comprehension  Verbalized understanding;Returned demonstration      Objective   Pt states that her blood pressure is controlled.  No hx of back surgeries. Denies hx of CA or osteoporosis, no pacemakers or defibrillators No latex band allergies   MedbridgeAccess Code: [email or web address]   Therapeutic exercise  Standing B shoulder extension resisting yellow band 10x2 with 5 second holds  Standing straight pallof press resisting double yellow band 10x5 seconds for 3 sets  Reviewed and given as part of her HEP. Pt demonstrated and verbalized understanding. Handout provided.   Standing LE leg press resisting blue band with B UE assist 10x3each LE  Sitting manually resisted trunk extension 10x3 with 5 second holds  Sitting with manually resisted trunk flexion 10x3 with 5 seconds   Static mini lunge onto Dyna Disc with one UE  assist 10x3 each LE to promote glute muscle use, emphasis on femoral control   Pt seems to be making good progress with decreased back pain overall with pt arriving to clinic without complain of pain or symptoms. Improving posture observed with decreased R lateral shift. Continued working on gentle trunk extension, trunk and glute strengthening to promote progress. Pt will benefit from continued skilled physical therapy services to decrease pain, improve strength and function.          PT Short Term Goals - 05/09/18 1302      PT SHORT TERM GOAL #1   Title  Patient will be independent with her HEP to decrease pain and improve function.     Baseline  Pt has started her HEP (05/09/2018)    Time  3    Period  Weeks    Status  New    Target Date  05/31/18        PT Long Term Goals - 05/09/18 1303      PT LONG TERM GOAL #1   Title  Patient will improve bilateral hip extension and abduction strength by at least 1/2 MMT grade to help decrease back and LE pain, and improve ability to perform standing tasks more comfortably.     Time  6    Period  Weeks    Status  New    Target Date  06/21/18      PT LONG TERM GOAL #2   Title  Patient will have a decrease in back pain to 6/10 or less at worst to promote ability to perform sitting and standing tasks more comfortably.     Baseline  10/10 back pain at most for the past month (05/09/2018)    Time  6    Period  Weeks    Status  New    Target Date  06/21/18      PT LONG TERM GOAL #3   Title  Patient will improve her FOTO score by at least 10 points as a demonstration of improved function.     Baseline  Lumbar spine FOTO 63 (05/09/2018)    Time  6    Period  Weeks    Status  New    Target Date  06/21/18      PT LONG TERM GOAL #4   Title  Patient will improve her Modified Oswestry score by at least 10% as a demonstration of improved function.     Baseline  28% (05/09/18)    Time  6    Period  Weeks    Status  New    Target  Date  06/21/18  Plan - 05/31/18 0951    Clinical Impression Statement  Pt seems to be making good progress with decreased back pain overall with pt arriving to clinic without complain of pain or symptoms. Improving posture observed with decreased R lateral shift. Continued working on gentle trunk extension, trunk and glute strengthening to promote progress. Pt will benefit from continued skilled physical therapy services to decrease pain, improve strength and function.     Rehab Potential  Fair    Clinical Impairments Affecting Rehab Potential  age, back and LE pain, hip weakness    PT Frequency  2x / week    PT Duration  6 weeks    PT Treatment/Interventions  Therapeutic activities;Therapeutic exercise;Neuromuscular re-education;Patient/family education;Manual techniques;Dry needling;Aquatic Therapy;Electrical Stimulation;Iontophoresis 4mg /ml Dexamethasone;Ultrasound;Traction   traction if appropriate   PT Next Visit Plan  gentle thoracic extension, gentle lumbar extension, trunk, hip, core strengthening. Manual techniques, modalities PRN    Consulted and Agree with Plan of Care  Patient       Patient will benefit from skilled therapeutic intervention in order to improve the following deficits and impairments:  Pain, Postural dysfunction, Improper body mechanics, Decreased strength  Visit Diagnosis: Chronic bilateral low back pain, unspecified whether sciatica present  Radiculopathy, lumbosacral region  Muscle weakness (generalized)     Problem List Patient Active Problem List   Diagnosis Date Noted  . Chronic right-sided low back pain without sciatica 04/17/2018  . History of CVA (cerebrovascular accident) 03/14/2017  . Gastritis   . BPPV (benign paroxysmal positional vertigo) 03/24/2015  . Right hip pain 01/07/2015  . Absolute anemia 01/06/2015  . BMI 28.0-28.9,adult 01/06/2015  . Cervical disc disease 01/06/2015  . Atypical chest pain 01/06/2015  .  Generalized pruritus 01/06/2015  . Acid reflux 01/06/2015  . Cervicogenic headache 01/06/2015  . Hypercholesteremia 01/06/2015  . Low back pain 01/06/2015  . Pseudoarthrosis of cervical spine ([place]) 08/26/2013  . Accumulation of fluid in tissues 12/24/2009  . Avitaminosis D 10/15/2009  . Cardiac conduction disorder 09/30/2008  . Essential (primary) hypertension 09/30/2008  . Blood in the urine 09/30/2008    Joneen Boers PT, DPT   05/31/2018, 12:36 PM  [place] [facility] [address] [place], [address] Phone: [phone]   Fax:  [phone]  Name: [name] MRN: [id number] Date of Birth: 12/17/1955

## 2018-05-31 NOTE — Patient Instructions (Signed)
Standing straight pallof press resisting double yellow band 10x5 seconds for 3 sets  Reviewed and given as part of her HEP. Pt demonstrated and verbalized understanding. Handout provided.

## 2018-06-01 ENCOUNTER — Ambulatory Visit (INDEPENDENT_AMBULATORY_CARE_PROVIDER_SITE_OTHER): Payer: Medicare HMO | Admitting: Orthopaedic Surgery

## 2018-06-04 ENCOUNTER — Ambulatory Visit: Payer: Medicare HMO

## 2018-06-04 ENCOUNTER — Telehealth: Payer: Self-pay

## 2018-06-04 NOTE — Telephone Encounter (Signed)
No show. Called patient who said that she thought her appointment was at 9:45 am. Will be able to make it to her next follow up appointment.

## 2018-06-06 ENCOUNTER — Ambulatory Visit: Payer: Medicare HMO

## 2018-06-10 ENCOUNTER — Other Ambulatory Visit: Payer: Self-pay | Admitting: Physician Assistant

## 2018-06-10 DIAGNOSIS — Z8673 Personal history of transient ischemic attack (TIA), and cerebral infarction without residual deficits: Secondary | ICD-10-CM

## 2018-06-10 DIAGNOSIS — E7800 Pure hypercholesterolemia, unspecified: Secondary | ICD-10-CM

## 2018-06-11 ENCOUNTER — Ambulatory Visit: Payer: Medicare HMO

## 2018-06-13 ENCOUNTER — Ambulatory Visit: Payer: Medicare HMO

## 2018-06-15 ENCOUNTER — Ambulatory Visit (INDEPENDENT_AMBULATORY_CARE_PROVIDER_SITE_OTHER): Payer: Medicare HMO | Admitting: Orthopaedic Surgery

## 2018-06-18 ENCOUNTER — Ambulatory Visit: Payer: Medicare HMO

## 2018-06-22 ENCOUNTER — Other Ambulatory Visit: Payer: Self-pay | Admitting: Physician Assistant

## 2018-06-22 DIAGNOSIS — D508 Other iron deficiency anemias: Secondary | ICD-10-CM

## 2018-06-22 NOTE — Telephone Encounter (Signed)
Pt needing a refill on: ferrous sulfate 325 (65 FE) MG tablet - pt is out  Please fill at:  [facility] (N), [place] - [address] [phone] (Phone) [phone] (Fax)   Thanks, [employer]

## 2018-06-25 MED ORDER — FERROUS SULFATE 325 (65 FE) MG PO TABS: 325.0000 mg | ORAL_TABLET | Freq: Every day | ORAL | 1 refills | Status: DC

## 2018-07-03 ENCOUNTER — Telehealth: Payer: Self-pay

## 2018-07-03 DIAGNOSIS — K2100 Gastro-esophageal reflux disease with esophagitis, without bleeding: Secondary | ICD-10-CM

## 2018-07-03 NOTE — Telephone Encounter (Signed)
Pt checking back on the medication request.  Pt wanting a call back once the Rx is called in.  Thanks, [employer]

## 2018-07-03 NOTE — Telephone Encounter (Signed)
Patient called office today requesting refill on Sucralfate, patient states that she has been off medication for some time now and has been taking Dexilant but states that it no longer is helping with symptoms. Patient reports symptoms of reflux and gas, patient is requesting that prescription be sent to [facility] on [address] [facility]. Patient was advised that Tawanna Sat is out of the office today, will forward to physician for review. KW

## 2018-07-04 MED ORDER — SUCRALFATE 1 G PO TABS: 1.0000 g | ORAL_TABLET | Freq: Three times a day (TID) | ORAL | 0 refills | Status: DC

## 2018-07-04 NOTE — Telephone Encounter (Signed)
Sent in

## 2018-07-04 NOTE — Telephone Encounter (Signed)
lm

## 2018-07-05 ENCOUNTER — Other Ambulatory Visit: Payer: Self-pay | Admitting: Physician Assistant

## 2018-07-05 DIAGNOSIS — I10 Essential (primary) hypertension: Secondary | ICD-10-CM

## 2018-07-11 ENCOUNTER — Ambulatory Visit: Payer: Medicare HMO | Attending: Orthopaedic Surgery

## 2018-07-13 ENCOUNTER — Encounter: Payer: Self-pay | Admitting: Emergency Medicine

## 2018-07-13 ENCOUNTER — Emergency Department
Admission: EM | Admit: 2018-07-13 | Discharge: 2018-07-13 | Disposition: A | Discharge status: Home / Self Care | Payer: Medicare HMO | Attending: Emergency Medicine | Admitting: Emergency Medicine

## 2018-07-13 ENCOUNTER — Other Ambulatory Visit: Payer: Self-pay

## 2018-07-13 DIAGNOSIS — Y999 Unspecified external cause status: Secondary | ICD-10-CM | POA: Insufficient documentation

## 2018-07-13 DIAGNOSIS — Y939 Activity, unspecified: Secondary | ICD-10-CM | POA: Diagnosis not present

## 2018-07-13 DIAGNOSIS — M7918 Myalgia, other site: Secondary | ICD-10-CM | POA: Diagnosis not present

## 2018-07-13 DIAGNOSIS — Y929 Unspecified place or not applicable: Secondary | ICD-10-CM | POA: Diagnosis not present

## 2018-07-13 DIAGNOSIS — M542 Cervicalgia: Secondary | ICD-10-CM | POA: Diagnosis not present

## 2018-07-13 DIAGNOSIS — Z79899 Other long term (current) drug therapy: Secondary | ICD-10-CM | POA: Insufficient documentation

## 2018-07-13 DIAGNOSIS — M549 Dorsalgia, unspecified: Secondary | ICD-10-CM | POA: Diagnosis not present

## 2018-07-13 MED ORDER — CYCLOBENZAPRINE HCL 10 MG PO TABS
10.0000 mg | ORAL_TABLET | Freq: Once | ORAL | Status: AC
  Administered 2018-07-13: 10 mg via ORAL
  Filled 2018-07-13: qty 1

## 2018-07-13 MED ORDER — CYCLOBENZAPRINE HCL 5 MG PO TABS: 5.0000 mg | ORAL_TABLET | Freq: Three times a day (TID) | ORAL | 0 refills | Status: DC | PRN
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
## 2018-08-27 ENCOUNTER — Telehealth: Payer: Self-pay | Admitting: Physician Assistant

## 2018-08-27 NOTE — Telephone Encounter (Signed)
Have her hold plavix for 5 days prior. Can restart day after procedure.   Letter will be sent.

## 2018-08-27 NOTE — Telephone Encounter (Signed)
Pt is having a tooth pulled in the future and her dentist will need a letter faxed to him stating how long she will need to be off her blood thinners before the procedure.  Please call patient and let her know when this has been done.  Pt's [phone]   The dentist is Dr. Nicola Girt  Fax number is [phone]  Thanks [employer]

## 2018-08-27 NOTE — Telephone Encounter (Signed)
Patient advised as directed below. 

## 2018-08-27 NOTE — Telephone Encounter (Signed)
Please Review

## 2018-09-03 ENCOUNTER — Other Ambulatory Visit: Payer: Self-pay | Admitting: Physician Assistant

## 2018-09-03 DIAGNOSIS — K2100 Gastro-esophageal reflux disease with esophagitis, without bleeding: Secondary | ICD-10-CM

## 2018-09-03 MED ORDER — SUCRALFATE 1 G PO TABS: 1.0000 g | ORAL_TABLET | Freq: Three times a day (TID) | ORAL | 0 refills | Status: DC

## 2018-09-04 ENCOUNTER — Telehealth: Payer: Self-pay | Admitting: Physician Assistant

## 2018-09-04 NOTE — Telephone Encounter (Signed)
Pt calling back needing to know about starting back on her blood thinner Rx.

## 2018-09-04 NOTE — Telephone Encounter (Signed)
Patient was advised.  

## 2018-09-04 NOTE — Telephone Encounter (Signed)
Please advise 

## 2018-09-04 NOTE — Telephone Encounter (Signed)
Pt has been off of her blood thinner medication since last Wed. Her tooth pulled yesterday and needing to know when she needst to start back on medication.  Please advise asap.  Thanks, [employer]

## 2018-09-04 NOTE — Telephone Encounter (Signed)
Ok to start today

## 2018-10-15 DIAGNOSIS — M545 Low back pain: Principal | ICD-10-CM

## 2018-10-15 DIAGNOSIS — M6281 Muscle weakness (generalized): Secondary | ICD-10-CM

## 2018-10-15 DIAGNOSIS — M5417 Radiculopathy, lumbosacral region: Secondary | ICD-10-CM

## 2018-10-15 DIAGNOSIS — G8929 Other chronic pain: Secondary | ICD-10-CM

## 2018-10-15 NOTE — Therapy (Signed)
[facility] [address], [place], [address] Phone: [phone]   Fax:  [phone]  Physical Therapy  Discharge Summary   Patient Details  Name: [name] MRN: [id number] Date of Birth: 01-Apr-1956 Referring Provider (PT): Joni Fears, MD   Encounter Date: 10/15/2018    Past Medical History:  Diagnosis Date  . Allergy   . Anemia    takes Ferrous Sulfate daily  . Arthritis   . GERD (gastroesophageal reflux disease)    takes OTC med  . Hyperlipidemia   . Hypertension    takes Amlodipine daily  . Low back pain    occasionally    Past Surgical History:  Procedure Laterality Date  . ANTERIOR CERVICAL DECOMP/DISCECTOMY FUSION  10/19/2011   Procedure: ANTERIOR CERVICAL DECOMPRESSION/DISCECTOMY FUSION 1 LEVEL/HARDWARE REMOVAL;  Surgeon: Elaina Hoops, MD;  Location: [place] [facility];  Service: Neurosurgery;  Laterality: N/A;  Cervical four - five  Anterior cervical decompression fusion with redo at six - seven  Rm 33  . CARPAL TUNNEL RELEASE    . COLONOSCOPY  08/2014   polyps  . ESOPHAGOGASTRODUODENOSCOPY (EGD) WITH PROPOFOL N/A 06/23/2015   Procedure: ESOPHAGOGASTRODUODENOSCOPY (EGD) WITH PROPOFOL;  Surgeon: [name], MD;  Location: [facility];  Service: Endoscopy;  Laterality: N/A;  . NECK SURGERY    . POSTERIOR CERVICAL FUSION/FORAMINOTOMY N/A 08/26/2013   Procedure: C/4-5,C/5-6,C/6-7 Posterior Cervical Fusion w/lateral mass fixation;  Surgeon: Elaina Hoops, MD;  Location: [place] [facility];  Service: Neurosurgery;  Laterality: N/A;  . TUBAL LIGATION      There were no vitals filed for this visit.                           Pt has only been to physical therapy 4 times and has shown some progress in overall decreasing back pain. Pt originally started with 9/10 starting back pain but symptoms seem to have decreased to 5/10 to 0/10 starting back pain based on the last few  appointments. Skilled physical therapy services however discharged secondary to multiple missed visits.       PT Short Term Goals - 10/15/18 0910      PT SHORT TERM GOAL #1   Title  Patient will be independent with her HEP to decrease pain and improve function.     Baseline  Pt has started her HEP (05/09/2018); Pt seem to have been doing her HEP partially at times based on 05/22/2018 subjective (10/15/2018)    Time  3    Period  Weeks    Status  Partially Met    Target Date  05/31/18        PT Long Term Goals - 10/15/18 0913      PT LONG TERM GOAL #1   Title  Patient will improve bilateral hip extension and abduction strength by at least 1/2 MMT grade to help decrease back and LE pain, and improve ability to perform standing tasks more comfortably.     Time  6    Period  Weeks    Status  Unable to assess    Target Date  06/21/18      PT LONG TERM GOAL #2   Title  Patient will have a decrease in back pain to 6/10 or less at worst to promote ability to perform sitting and standing tasks more comfortably.     Baseline  10/10 back pain at most for the  past month (05/09/2018)    Time  6    Period  Weeks    Status  Unable to assess    Target Date  06/21/18      PT LONG TERM GOAL #3   Title  Patient will improve her FOTO score by at least 10 points as a demonstration of improved function.     Baseline  Lumbar spine FOTO 63 (05/09/2018)    Time  6    Period  Weeks    Status  Unable to assess    Target Date  06/21/18      PT LONG TERM GOAL #4   Title  Patient will improve her Modified Oswestry score by at least 10% as a demonstration of improved function.     Baseline  28% (05/09/18)    Time  6    Period  Weeks    Status  Unable to assess    Target Date  06/21/18            Plan - 10/15/18 0910    Clinical Impression Statement  Pt has only been to physical therapy 4 times and has shown some progress in overall decreasing back pain. Pt originally started with 9/10  starting back pain but symptoms seem to have decreased to 5/10 to 0/10 starting back pain based on the last few appointments. Skilled physical therapy services however discharged secondary to multiple missed visits.     Rehab Potential  Fair    Clinical Impairments Affecting Rehab Potential  age, back and LE pain, hip weakness    PT Treatment/Interventions  Therapeutic activities;Therapeutic exercise;Neuromuscular re-education;Patient/family education;Manual techniques   traction if appropriate   PT Next Visit Plan  Continue with home exercise program    Consulted and Agree with Plan of Care  Patient       Patient will benefit from skilled therapeutic intervention in order to improve the following deficits and impairments:  Pain, Postural dysfunction, Improper body mechanics, Decreased strength  Visit Diagnosis: Chronic bilateral low back pain, unspecified whether sciatica present  Radiculopathy, lumbosacral region  Muscle weakness (generalized)     Problem List Patient Active Problem List   Diagnosis Date Noted  . Chronic right-sided low back pain without sciatica 04/17/2018  . History of CVA (cerebrovascular accident) 03/14/2017  . Gastritis   . BPPV (benign paroxysmal positional vertigo) 03/24/2015  . Right hip pain 01/07/2015  . Absolute anemia 01/06/2015  . BMI 28.0-28.9,adult 01/06/2015  . Cervical disc disease 01/06/2015  . Atypical chest pain 01/06/2015  . Generalized pruritus 01/06/2015  . Acid reflux 01/06/2015  . Cervicogenic headache 01/06/2015  . Hypercholesteremia 01/06/2015  . Low back pain 01/06/2015  . Pseudoarthrosis of cervical spine ([place]) 08/26/2013  . Accumulation of fluid in tissues 12/24/2009  . Avitaminosis D 10/15/2009  . Cardiac conduction disorder 09/30/2008  . Essential (primary) hypertension 09/30/2008  . Blood in the urine 09/30/2008    Thank you for your referral.  Joneen Boers PT, DPT   10/15/2018, 9:18 AM  [facility] [address], [place], [address] Phone: [phone]   Fax:  [phone]  Name: MASON BURLEIGH MRN: [id number] Date of Birth: [birth year]

## 2018-10-30 ENCOUNTER — Other Ambulatory Visit: Payer: Self-pay | Admitting: Physician Assistant

## 2018-10-30 DIAGNOSIS — J301 Allergic rhinitis due to pollen: Secondary | ICD-10-CM

## 2018-11-15 ENCOUNTER — Other Ambulatory Visit: Payer: Self-pay | Admitting: Physician Assistant

## 2018-11-15 DIAGNOSIS — K2100 Gastro-esophageal reflux disease with esophagitis, without bleeding: Secondary | ICD-10-CM

## 2018-11-28 ENCOUNTER — Other Ambulatory Visit: Payer: Self-pay | Admitting: *Deleted

## 2018-11-28 MED ORDER — ALBUTEROL SULFATE HFA 108 (90 BASE) MCG/ACT IN AERS: 2.0000 | INHALATION_SPRAY | Freq: Four times a day (QID) | RESPIRATORY_TRACT | 1 refills | Status: DC | PRN

## 2018-12-06 ENCOUNTER — Other Ambulatory Visit: Payer: Self-pay | Admitting: Physician Assistant

## 2018-12-06 DIAGNOSIS — Z1231 Encounter for screening mammogram for malignant neoplasm of breast: Secondary | ICD-10-CM

## 2019-01-02 ENCOUNTER — Ambulatory Visit (INDEPENDENT_AMBULATORY_CARE_PROVIDER_SITE_OTHER): Payer: Medicare Other

## 2019-01-02 ENCOUNTER — Ambulatory Visit: Payer: Medicare HMO | Admitting: Physician Assistant

## 2019-01-02 ENCOUNTER — Telehealth: Payer: Self-pay | Admitting: *Deleted

## 2019-01-02 ENCOUNTER — Other Ambulatory Visit: Payer: Self-pay | Admitting: Physician Assistant

## 2019-01-02 ENCOUNTER — Ambulatory Visit (INDEPENDENT_AMBULATORY_CARE_PROVIDER_SITE_OTHER): Payer: Medicare Other | Admitting: Physician Assistant

## 2019-01-02 ENCOUNTER — Other Ambulatory Visit: Payer: Self-pay

## 2019-01-02 DIAGNOSIS — H1013 Acute atopic conjunctivitis, bilateral: Secondary | ICD-10-CM

## 2019-01-02 DIAGNOSIS — Z23 Encounter for immunization: Secondary | ICD-10-CM

## 2019-01-02 DIAGNOSIS — Z0000 Encounter for general adult medical examination without abnormal findings: Secondary | ICD-10-CM | POA: Diagnosis not present

## 2019-01-02 DIAGNOSIS — K2100 Gastro-esophageal reflux disease with esophagitis, without bleeding: Secondary | ICD-10-CM

## 2019-01-02 DIAGNOSIS — K219 Gastro-esophageal reflux disease without esophagitis: Secondary | ICD-10-CM

## 2019-01-02 DIAGNOSIS — Z8673 Personal history of transient ischemic attack (TIA), and cerebral infarction without residual deficits: Secondary | ICD-10-CM

## 2019-01-02 DIAGNOSIS — E7800 Pure hypercholesterolemia, unspecified: Secondary | ICD-10-CM

## 2019-01-02 MED ORDER — AZELASTINE HCL 0.05 % OP SOLN: 1.0000 [drp] | Freq: Two times a day (BID) | OPHTHALMIC | 12 refills | Status: DC

## 2019-01-02 NOTE — Progress Notes (Addendum)
Subjective:   [name] is a 63 y.o. female who presents for Medicare Annual Intial preventive examination.    This visit is being conducted through telemedicine due to the COVID-19 pandemic. This patient has given me verbal consent via doximity to conduct this visit, patient states they are participating from their home address. Some vital signs may be absent or patient reported.    Patient identification: identified by name, DOB, and current address  Review of Systems:  N/A  Cardiac Risk Factors include: advanced age (>30men, >30 women);dyslipidemia;hypertension     Objective:     Vitals: There were no vitals taken for this visit.  There is no height or weight on file to calculate BMI. Unable to obtain vitals due to visit being conducted via telephonically.   Advanced Directives 01/02/2019 07/13/2018 05/09/2018 09/04/2017 08/04/2017 03/14/2017 12/07/2016  Does Patient Have a Medical Advance Directive? No No No No No No No  Would patient like information on creating a medical advance directive? No - Patient declined - No - Patient declined No - Patient declined No - Patient declined No - Patient declined Yes (ED - Information included in AVS)  Pre-existing out of facility DNR order (yellow form or pink MOST form) - - - - - - -    Tobacco Social History   Tobacco Use  Smoking Status Never Smoker  Smokeless Tobacco Never Used     Counseling given: Not Answered   Clinical Intake:  Pre-visit preparation completed: Yes  Pain : No/denies pain Pain Score: 0-No pain     Nutritional Status: BMI 25 -29 Overweight Nutritional Risks: None Diabetes: No  How often do you need to have someone help you when you read instructions, pamphlets, or other written materials from your doctor or pharmacy?: 1 - Never  Interpreter Needed?: No  Information entered by :: [facility], LPN  Past Medical History:  Diagnosis Date  . Allergy   . Anemia    takes Ferrous Sulfate daily  .  Arthritis   . GERD (gastroesophageal reflux disease)    takes OTC med  . Hyperlipidemia   . Hypertension    takes Amlodipine daily  . Low back pain    occasionally   Past Surgical History:  Procedure Laterality Date  . ANTERIOR CERVICAL DECOMP/DISCECTOMY FUSION  10/19/2011   Procedure: ANTERIOR CERVICAL DECOMPRESSION/DISCECTOMY FUSION 1 LEVEL/HARDWARE REMOVAL;  Surgeon: Elaina Hoops, MD;  Location: [facility];  Service: Neurosurgery;  Laterality: N/A;  Cervical four - five  Anterior cervical decompression fusion with redo at six - [age]  Rm 33  . CARPAL TUNNEL RELEASE    . COLONOSCOPY  08/2014   polyps  . ESOPHAGOGASTRODUODENOSCOPY (EGD) WITH PROPOFOL N/A 06/23/2015   Procedure: ESOPHAGOGASTRODUODENOSCOPY (EGD) WITH PROPOFOL;  Surgeon: [name], MD;  Location: [facility];  Service: Endoscopy;  Laterality: N/A;  . NECK SURGERY    . POSTERIOR CERVICAL FUSION/FORAMINOTOMY N/A 08/26/2013   Procedure: C/4-5,C/5-6,C/6-7 Posterior Cervical Fusion w/lateral mass fixation;  Surgeon: Elaina Hoops, MD;  Location: [facility];  Service: Neurosurgery;  Laterality: N/A;  . TUBAL LIGATION     Family History  Problem Relation Age of Onset  . Hypertension Mother   . Heart disease Mother   . Breast cancer Neg Hx    Social History   Socioeconomic History  . Marital status: Single    Spouse name: Not on file  . Number of children: 3  . Years of education: Not on file  . Highest  education level: Some college, no degree  Occupational History  . Occupation: disable  Social Needs  . Financial resource strain: Hard  . Food insecurity:    Worry: Never true    Inability: Never true  . Transportation needs:    Medical: No    Non-medical: No  Tobacco Use  . Smoking status: Never Smoker  . Smokeless tobacco: Never Used  Substance and Sexual Activity  . Alcohol use: No  . Drug use: No  . Sexual activity: Not on file  Lifestyle  . Physical activity:    Days per week: 0 days    Minutes  per session: 0 min  . Stress: Very much  Relationships  . Social connections:    Talks on phone: Patient refused    Gets together: Patient refused    Attends religious service: Patient refused    Active member of club or organization: Patient refused    Attends meetings of clubs or organizations: Patient refused    Relationship status: Patient refused  Other Topics Concern  . Not on file  Social History Narrative  . Not on file    Outpatient Encounter Medications as of 01/02/2019  Medication Sig  . acetaminophen (TYLENOL) 500 MG tablet Take 2 tablets (1,000 mg total) by mouth every 6 (six) hours as needed.  Marland Kitchen albuterol (VENTOLIN HFA) 108 (90 Base) MCG/ACT inhaler Inhale 2 puffs into the lungs every 6 (six) hours as needed for wheezing or shortness of breath.  Marland Kitchen amLODipine (NORVASC) 10 MG tablet TAKE 1 TABLET BY MOUTH ONCE DAILY  . atorvastatin (LIPITOR) 40 MG tablet TAKE 1 TABLET BY MOUTH ONCE DAILY  . Cholecalciferol (VITAMIN D) 2000 UNITS tablet Take 2,000 Units by mouth daily.  . clopidogrel (PLAVIX) 75 MG tablet TAKE 1 TABLET BY MOUTH ONCE DAILY  . dexlansoprazole (DEXILANT) 60 MG capsule Take 1 capsule (60 mg total) by mouth daily.  . ferrous sulfate 325 (65 FE) MG tablet Take 1 tablet (325 mg total) by mouth daily with breakfast.  . fluticasone (FLONASE) 50 MCG/ACT nasal spray USE 2 SPRAYS IN EACH NOSTRIL ONCE DAILY  . Omega-3 Fatty Acids (FISH OIL) 1200 MG CAPS Take 1 capsule by mouth daily.  . Potassium 75 MG TABS Take by mouth.  . sucralfate (CARAFATE) 1 g tablet TAKE 1 TABLET BY MOUTH FOUR TIMES DAILY WITH MEALS AND AT BEDTIME  . cyclobenzaprine (FLEXERIL) 5 MG tablet Take 1 tablet (5 mg total) by mouth 3 (three) times daily as needed for muscle spasms. (Patient not taking: Reported on 01/02/2019)  . loratadine (CLARITIN) 10 MG tablet Take 1 tablet (10 mg total) by mouth daily. (Patient not taking: Reported on 01/02/2019)   No facility-administered encounter medications on  file as of 01/02/2019.     Activities of Daily Living In your present state of health, do you have any difficulty performing the following activities: 01/02/2019  Hearing? N  Vision? N  Difficulty concentrating or making decisions? N  Walking or climbing stairs? N  Dressing or bathing? N  Doing errands, shopping? N  Preparing Food and eating ? N  Using the Toilet? N  In the past six months, have you accidently leaked urine? N  Do you have problems with loss of bowel control? N  Managing your Medications? N  Managing your Finances? N  Housekeeping or managing your Housekeeping? N  Some recent data might be hidden    Patient Care Team: Rubye Beach as PCP - General (Family Medicine)  Assessment:   This is a routine wellness examination for Sharman.  Exercise Activities and Dietary recommendations Current Exercise Habits: The patient does not participate in regular exercise at present, Exercise limited by: None identified  Goals    . Increase water intake     Recommend increasing water intake to 4 glasses a day.       Fall Risk: Fall Risk  01/02/2019 12/25/2017 12/07/2016 09/08/2015 01/07/2015  Falls in the past year? 0 No No No No    FALL RISK PREVENTION PERTAINING TO THE HOME:  Any stairs in or around the home? No  If so, are there any without handrails? N/A  Home free of loose throw rugs in walkways, pet beds, electrical cords, etc? Yes  Adequate lighting in your home to reduce risk of falls? Yes   ASSISTIVE DEVICES UTILIZED TO PREVENT FALLS:  Life alert? No  Use of a cane, walker or w/c? No  Grab bars in the bathroom? No  Shower chair or bench in shower? No  Elevated toilet seat or a handicapped toilet? Yes    TIMED UP AND GO:  Was the test performed? No .    Depression Screen PHQ 2/9 Scores 01/02/2019 12/25/2017 12/07/2016 12/07/2016  PHQ - 2 Score 4 0 0 0  PHQ- 9 Score 6 - 0 -     Cognitive Function: Declined today.      6CIT Screen  12/07/2016  What Year? 0 points  What month? 0 points  What time? 0 points  Count back from 20 0 points  Months in reverse 0 points  Repeat phrase 0 points  Total Score 0    Immunization History  Administered Date(s) Administered  . Influenza-Unspecified 03/07/2017, 06/14/2018  . Tdap 09/30/2008    Qualifies for Shingles Vaccine? Yes . Due for Shingrix. Education has been provided regarding the importance of this vaccine. Pt has been advised to call insurance company to determine out of pocket expense. Advised may also receive vaccine at local pharmacy or Health Dept. Verbalized acceptance and understanding.  Tdap: Pt to receive vaccine at nurse visit today.   Flu Vaccine: Up to date   Screening Tests Health Maintenance  Topic Date Due  . TETANUS/TDAP  10/01/2018  . INFLUENZA VACCINE  02/23/2019  . COLONOSCOPY  10/28/2019  . PAP SMEAR-Modifier  12/10/2019  . MAMMOGRAM  01/20/2020  . Hepatitis C Screening  Completed  . HIV Screening  Completed    Cancer Screenings:  Colorectal Screening: Completed 10/28/14. Repeat every 5 years.  Mammogram: Completed 01/19/18.    Lung Cancer Screening: (Low Dose CT Chest recommended if Age 83-80 years, 30 pack-year currently smoking OR have quit w/in 15years.) does not qualify.   Additional Screening:  Hepatitis C Screening: Up to date  Vision Screening: Recommended annual ophthalmology exams for early detection of glaucoma and other disorders of the eye.  Dental Screening: Recommended annual dental exams for proper oral hygiene  Community Resource Referral:  CRR required this visit?  No       Plan:  I have personally reviewed and addressed the Medicare Annual Wellness questionnaire and have noted the following in the patient's chart:  A. Medical and social history B. Use of alcohol, tobacco or illicit drugs  C. Current medications and supplements D. Functional ability and status E.  Nutritional status F.  Physical  activity G. Advance directives H. List of other physicians I.  Hospitalizations, surgeries, and ER visits in previous 12 months J.  Vitals K. Screenings such  as hearing and vision if needed, cognitive and depression L. Referrals and appointments   In addition, I have reviewed and discussed with patient certain preventive protocols, quality metrics, and best practice recommendations. A written personalized care plan for preventive services as well as general preventive health recommendations were provided to patient. Nurse Health Advisor  Signed,    Moana Munford [place], [place]  [id number] Nurse Health Advisor   Nurse Notes: None.

## 2019-01-02 NOTE — Patient Instructions (Addendum)
[name] , Thank you for taking time to come for your Medicare Wellness Visit. I appreciate your ongoing commitment to your health goals. Please review the following plan we discussed and let me know if I can assist you in the future.   Screening recommendations/referrals: Colonoscopy: Up to date, due 10/2019 Mammogram: Up to date, due 12/2019 Bone Density: Not required until age 63. Recommended yearly ophthalmology/optometry visit for glaucoma screening and checkup Recommended yearly dental visit for hygiene and checkup  Vaccinations: Influenza vaccine: Up to date Pneumococcal vaccine: Not required until age 58. Tdap vaccine: Pt to receive today in office.  Shingles vaccine: Pt declines today.     Advanced directives: Advance directive discussed with you today. Even though you declined this today please call our office should you change your mind and we can give you the proper paperwork for you to fill out.  Conditions/risks identified: Continue to increase water intake to 6-8 8 oz glasses a day.   Next appointment: 11:20 AM today for a tetanus vaccine. CPE scheduled for 03/08/19 @ 10 AM. Declined scheduling an AWV for 2021 at this time.   Preventive Care 40-64 Years, Female Preventive care refers to lifestyle choices and visits with your health care provider that can promote health and wellness. What does preventive care include?  A yearly physical exam. This is also called an annual well check.  Dental exams once or twice a year.  Routine eye exams. Ask your health care provider how often you should have your eyes checked.  Personal lifestyle choices, including:  Daily care of your teeth and gums.  Regular physical activity.  Eating a healthy diet.  Avoiding tobacco and drug use.  Limiting alcohol use.  Practicing safe sex.  Taking low-dose aspirin daily starting at age 35.  Taking vitamin and mineral supplements as recommended by your health care provider. What  happens during an annual well check? The services and screenings done by your health care provider during your annual well check will depend on your age, overall health, lifestyle risk factors, and family history of disease. Counseling  Your health care provider may ask you questions about your:  Alcohol use.  Tobacco use.  Drug use.  Emotional well-being.  Home and relationship well-being.  Sexual activity.  Eating habits.  Work and work Statistician.  Method of birth control.  Menstrual cycle.  Pregnancy history. Screening  You may have the following tests or measurements:  Height, weight, and BMI.  Blood pressure.  Lipid and cholesterol levels. These may be checked every 5 years, or more frequently if you are over 23 years old.  Skin check.  Lung cancer screening. You may have this screening every year starting at age 69 if you have a 30-pack-year history of smoking and currently smoke or have quit within the past 15 years.  Fecal occult blood test (FOBT) of the stool. You may have this test every year starting at age 10.  Flexible sigmoidoscopy or colonoscopy. You may have a sigmoidoscopy every 5 years or a colonoscopy every 10 years starting at age 6.  Hepatitis C blood test.  Hepatitis B blood test.  Sexually transmitted disease (STD) testing.  Diabetes screening. This is done by checking your blood sugar (glucose) after you have not eaten for a while (fasting). You may have this done every 1-3 years.  Mammogram. This may be done every 1-2 years. Talk to your health care provider about when you should start having regular mammograms. This may depend  on whether you have a family history of breast cancer.  BRCA-related cancer screening. This may be done if you have a family history of breast, ovarian, tubal, or peritoneal cancers.  Pelvic exam and Pap test. This may be done every 3 years starting at age 37. Starting at age 18, this may be done every 5 years  if you have a Pap test in combination with an HPV test.  Bone density scan. This is done to screen for osteoporosis. You may have this scan if you are at high risk for osteoporosis. Discuss your test results, treatment options, and if necessary, the need for more tests with your health care provider. Vaccines  Your health care provider may recommend certain vaccines, such as:  Influenza vaccine. This is recommended every year.  Tetanus, diphtheria, and acellular pertussis (Tdap, Td) vaccine. You may need a Td booster every 10 years.  Zoster vaccine. You may need this after age 45.  Pneumococcal 13-valent conjugate (PCV13) vaccine. You may need this if you have certain conditions and were not previously vaccinated.  Pneumococcal polysaccharide (PPSV23) vaccine. You may need one or two doses if you smoke cigarettes or if you have certain conditions. Talk to your health care provider about which screenings and vaccines you need and how often you need them. This information is not intended to replace advice given to you by your health care provider. Make sure you discuss any questions you have with your health care provider. Document Released: 08/07/2015 Document Revised: 03/30/2016 Document Reviewed: 05/12/2015 Elsevier Interactive Patient Education  2017 [place] Prevention in the Home Falls can cause injuries. They can happen to people of all ages. There are many things you can do to make your home safe and to help prevent falls. What can I do on the outside of my home?  Regularly fix the edges of walkways and driveways and fix any cracks.  Remove anything that might make you trip as you walk through a door, such as a raised step or threshold.  Trim any bushes or trees on the path to your home.  Use bright outdoor lighting.  Clear any walking paths of anything that might make someone trip, such as rocks or tools.  Regularly check to see if handrails are loose or  broken. Make sure that both sides of any steps have handrails.  Any raised decks and porches should have guardrails on the edges.  Have any leaves, snow, or ice cleared regularly.  Use sand or salt on walking paths during winter.  Clean up any spills in your garage right away. This includes oil or grease spills. What can I do in the bathroom?  Use night lights.  Install grab bars by the toilet and in the tub and shower. Do not use towel bars as grab bars.  Use non-skid mats or decals in the tub or shower.  If you need to sit down in the shower, use a plastic, non-slip stool.  Keep the floor dry. Clean up any water that spills on the floor as soon as it happens.  Remove soap buildup in the tub or shower regularly.  Attach bath mats securely with double-sided non-slip rug tape.  Do not have throw rugs and other things on the floor that can make you trip. What can I do in the bedroom?  Use night lights.  Make sure that you have a light by your bed that is easy to reach.  Do not use any  sheets or blankets that are too big for your bed. They should not hang down onto the floor.  Have a firm chair that has side arms. You can use this for support while you get dressed.  Do not have throw rugs and other things on the floor that can make you trip. What can I do in the kitchen?  Clean up any spills right away.  Avoid walking on wet floors.  Keep items that you use a lot in easy-to-reach places.  If you need to reach something above you, use a strong step stool that has a grab bar.  Keep electrical cords out of the way.  Do not use floor polish or wax that makes floors slippery. If you must use wax, use non-skid floor wax.  Do not have throw rugs and other things on the floor that can make you trip. What can I do with my stairs?  Do not leave any items on the stairs.  Make sure that there are handrails on both sides of the stairs and use them. Fix handrails that are  broken or loose. Make sure that handrails are as long as the stairways.  Check any carpeting to make sure that it is firmly attached to the stairs. Fix any carpet that is loose or worn.  Avoid having throw rugs at the top or bottom of the stairs. If you do have throw rugs, attach them to the floor with carpet tape.  Make sure that you have a light switch at the top of the stairs and the bottom of the stairs. If you do not have them, ask someone to add them for you. What else can I do to help prevent falls?  Wear shoes that:  Do not have high heels.  Have rubber bottoms.  Are comfortable and fit you well.  Are closed at the toe. Do not wear sandals.  If you use a stepladder:  Make sure that it is fully opened. Do not climb a closed stepladder.  Make sure that both sides of the stepladder are locked into place.  Ask someone to hold it for you, if possible.  Clearly mark and make sure that you can see:  Any grab bars or handrails.  First and last steps.  Where the edge of each step is.  Use tools that help you move around (mobility aids) if they are needed. These include:  Canes.  Walkers.  Scooters.  Crutches.  Turn on the lights when you go into a dark area. Replace any light bulbs as soon as they burn out.  Set up your furniture so you have a clear path. Avoid moving your furniture around.  If any of your floors are uneven, fix them.  If there are any pets around you, be aware of where they are.  Review your medicines with your doctor. Some medicines can make you feel dizzy. This can increase your chance of falling. Ask your doctor what other things that you can do to help prevent falls. This information is not intended to replace advice given to you by your health care provider. Make sure you discuss any questions you have with your health care provider. Document Released: 05/07/2009 Document Revised: 12/17/2015 Document Reviewed: 08/15/2014 Elsevier  Interactive Patient Education  2017 [employer].

## 2019-01-02 NOTE — Telephone Encounter (Signed)
Sent in Optivar allergy eye drops to [facility]

## 2019-01-02 NOTE — Addendum Note (Signed)
Addended by: Julieta Bellini on: 01/02/2019 04:59 PM   Modules accepted: Orders

## 2019-01-02 NOTE — Telephone Encounter (Signed)
Patient was advised. Resent rx to [facility].

## 2019-01-02 NOTE — Telephone Encounter (Signed)
While patient was in the office today, she wanted to let Tawanna Sat know that she has been having a lot of eye irritation. Patient wanted to know if Tawanna Sat would send in an rx for an eye drop for eye allergies. Please advise?  [address]

## 2019-01-04 ENCOUNTER — Telehealth: Payer: Self-pay | Admitting: Physician Assistant

## 2019-01-04 NOTE — Telephone Encounter (Signed)
I have an FL2 form to complete for this patient. I have not seen her in a while and am having difficulty filling out the form. Can we please schedule her for either in office visit to further discuss or for a mychart evisit (telephone visit is ok also) so I can get more information from her.  Thanks! JB

## 2019-01-04 NOTE — Telephone Encounter (Signed)
Patient scheduled a Mychart video Monday 11:20 am

## 2019-01-07 ENCOUNTER — Telehealth (INDEPENDENT_AMBULATORY_CARE_PROVIDER_SITE_OTHER): Payer: Medicare Other | Admitting: Physician Assistant

## 2019-01-07 DIAGNOSIS — I459 Conduction disorder, unspecified: Secondary | ICD-10-CM | POA: Diagnosis not present

## 2019-01-07 DIAGNOSIS — H8110 Benign paroxysmal vertigo, unspecified ear: Secondary | ICD-10-CM

## 2019-01-07 DIAGNOSIS — I10 Essential (primary) hypertension: Secondary | ICD-10-CM

## 2019-01-07 DIAGNOSIS — S129XXD Fracture of neck, unspecified, subsequent encounter: Secondary | ICD-10-CM | POA: Diagnosis not present

## 2019-01-07 DIAGNOSIS — Z8673 Personal history of transient ischemic attack (TIA), and cerebral infarction without residual deficits: Secondary | ICD-10-CM

## 2019-01-07 NOTE — Progress Notes (Signed)
Patient: [name] Female    DOB: Aug 14, 1955   63 y.o.   MRN: [id number] Visit Date: 01/07/2019  Today's Provider: Mar Daring, PA-C   Chief Complaint  Patient presents with  . FL2 Form   Subjective:     Virtual Visit via Video Note  I connected with [name] on 01/07/19 at 11:20 AM EDT by a video enabled telemedicine application and verified that I am speaking with the correct person using two identifiers.  Location: Patient: home Provider: [facility]   I discussed the limitations of evaluation and management by telemedicine and the availability of in person appointments. The patient expressed understanding and agreed to proceed.   HPI Patient needing her FL2 form fill out. She is needing financial assistance and possibly housing assistance. She has chronic neck pain and has instances of vertigo secondary to her neck pain and limited motion. She also has CVD and HTN with h/o CVA.   Allergies  Allergen Reactions  . Aspirin Nausea Only  . Hydrochlorothiazide     Rash  . Ibuprofen Other (See Comments)    Pt says her doctor doesn't want her taking this with the blood thinner she's taking  . Lisinopril     Other reaction(s): Unknown  . Sulfa Antibiotics Hives and Nausea Only     Current Outpatient Medications:  .  acetaminophen (TYLENOL) 500 MG tablet, Take 2 tablets (1,000 mg total) by mouth every 6 (six) hours as needed., Disp: 90 tablet, Rfl: 1 .  albuterol (VENTOLIN HFA) 108 (90 Base) MCG/ACT inhaler, Inhale 2 puffs into the lungs every 6 (six) hours as needed for wheezing or shortness of breath., Disp: 1 Inhaler, Rfl: 1 .  amLODipine (NORVASC) 10 MG tablet, TAKE 1 TABLET BY MOUTH ONCE DAILY, Disp: 90 tablet, Rfl: 2 .  atorvastatin (LIPITOR) 40 MG tablet, TAKE 1 TABLET BY MOUTH EVERY DAY, Disp: 90 tablet, Rfl: 1 .  azelastine (OPTIVAR) 0.05 % ophthalmic solution, Place 1 drop into both eyes 2 (two) times daily., Disp: 6 mL, Rfl: 12 .  Cholecalciferol  (VITAMIN D) 2000 UNITS tablet, Take 2,000 Units by mouth daily., Disp: , Rfl:  .  clopidogrel (PLAVIX) 75 MG tablet, TAKE 1 TABLET BY MOUTH EVERY DAY, Disp: 90 tablet, Rfl: 1 .  DEXILANT 60 MG capsule, TAKE 1 CAPSULE BY MOUTH EVERY DAY, Disp: 90 capsule, Rfl: 2 .  ferrous sulfate 325 (65 FE) MG tablet, Take 1 tablet (325 mg total) by mouth daily with breakfast., Disp: 90 tablet, Rfl: 1 .  fluticasone (FLONASE) 50 MCG/ACT nasal spray, USE 2 SPRAYS IN EACH NOSTRIL ONCE DAILY, Disp: 16 g, Rfl: 5 .  Omega-3 Fatty Acids (FISH OIL) 1200 MG CAPS, Take 1 capsule by mouth daily., Disp: , Rfl:  .  Potassium 75 MG TABS, Take by mouth., Disp: , Rfl:  .  sucralfate (CARAFATE) 1 g tablet, TAKE 1 TABLET BY MOUTH FOUR TIMES DAILY AT BEDTIME WITH MEALS, Disp: 360 tablet, Rfl: 0 .  cyclobenzaprine (FLEXERIL) 5 MG tablet, Take 1 tablet (5 mg total) by mouth 3 (three) times daily as needed for muscle spasms. (Patient not taking: Reported on 01/02/2019), Disp: 15 tablet, Rfl: 0 .  loratadine (CLARITIN) 10 MG tablet, Take 1 tablet (10 mg total) by mouth daily. (Patient not taking: Reported on 01/02/2019), Disp: 30 tablet, Rfl: 3  Review of Systems  Constitutional: Negative.   Respiratory: Negative.   Cardiovascular: Negative.   Gastrointestinal: Negative.   Musculoskeletal:  Positive for arthralgias and neck pain.  Neurological: Positive for dizziness. Negative for weakness and numbness.    Social History   Tobacco Use  . Smoking status: Never Smoker  . Smokeless tobacco: Never Used  Substance Use Topics  . Alcohol use: No      Objective:   There were no vitals taken for this visit. There were no vitals filed for this visit.   Physical Exam Vitals signs reviewed.  Constitutional:      General: She is not in acute distress.    Appearance: Normal appearance. She is well-developed. She is not ill-appearing.  HENT:     Head: Normocephalic and atraumatic.  Neck:     Musculoskeletal: Normal range of  motion and neck supple.  Pulmonary:     Effort: Pulmonary effort is normal. No respiratory distress.  Neurological:     Mental Status: She is alert.  Psychiatric:        Mood and Affect: Mood normal.        Behavior: Behavior normal.        Thought Content: Thought content normal.        Judgment: Judgment normal.         Assessment & Plan     1. Pseudoarthrosis of cervical spine, subsequent encounter All medical diagnoses that would allow her to qualify for assistance in some form. FL2 form completed for patient.   2. Essential (primary) hypertension See above medical treatment plan.  3. Cardiac conduction disorder See above medical treatment plan.  4. Benign paroxysmal positional vertigo, unspecified laterality See above medical treatment plan.  5. History of CVA (cerebrovascular accident) See above medical treatment plan.   I discussed the assessment and treatment plan with the patient. The patient was provided an opportunity to ask questions and all were answered. The patient agreed with the plan and demonstrated an understanding of the instructions.   The patient was advised to call back or seek an in-person evaluation if the symptoms worsen or if the condition fails to improve as anticipated.  I provided 13 minutes of non-face-to-face time during this encounter.     Mar Daring, PA-C  [facility]

## 2019-01-16 ENCOUNTER — Other Ambulatory Visit: Payer: Self-pay | Admitting: Physician Assistant

## 2019-01-21 ENCOUNTER — Other Ambulatory Visit: Payer: Self-pay

## 2019-01-21 ENCOUNTER — Ambulatory Visit
Admission: RE | Admit: 2019-01-21 | Discharge: 2019-01-21 | Disposition: A | Discharge status: Home / Self Care | Payer: Medicare Other | Source: Ambulatory Visit | Attending: Physician Assistant | Admitting: Physician Assistant

## 2019-01-21 ENCOUNTER — Other Ambulatory Visit: Payer: Self-pay | Admitting: Physician Assistant

## 2019-01-21 ENCOUNTER — Telehealth: Payer: Self-pay | Admitting: Physician Assistant

## 2019-01-21 DIAGNOSIS — Z1231 Encounter for screening mammogram for malignant neoplasm of breast: Secondary | ICD-10-CM | POA: Diagnosis present

## 2019-01-21 DIAGNOSIS — I10 Essential (primary) hypertension: Secondary | ICD-10-CM

## 2019-01-21 NOTE — Telephone Encounter (Signed)
Left v/m to call us. Called to relay message  normal mammo and to schedule mammo next year

## 2019-01-21 NOTE — Telephone Encounter (Signed)
-----   Message from Mar Daring, [place] sent at 01/21/2019  2:08 PM EDT ----- Normal mammogram. Repeat screening in one year.

## 2019-02-06 ENCOUNTER — Other Ambulatory Visit: Payer: Self-pay | Admitting: Physician Assistant

## 2019-02-06 DIAGNOSIS — K2100 Gastro-esophageal reflux disease with esophagitis, without bleeding: Secondary | ICD-10-CM

## 2019-03-04 IMAGING — US US EXTREM LOW*R* LIMITED
1 series · 14 of 25 positions shown · non-contrast
Comparison: None

CLINICAL DATA: Tender mass in RIGHT inguinal region near inguinal
canal, question hernia versus adenopathy

EXAM:
ULTRASOUND RIGHT LOWER EXTREMITY LIMITED
TECHNIQUE: Ultrasound examination of the lower extremity soft tissues was
performed in the area of clinical concern.

[Series 1: us extrem low*right* limited · 0.06mm/px · 14 of 30 slices shown]
[im 1/30]
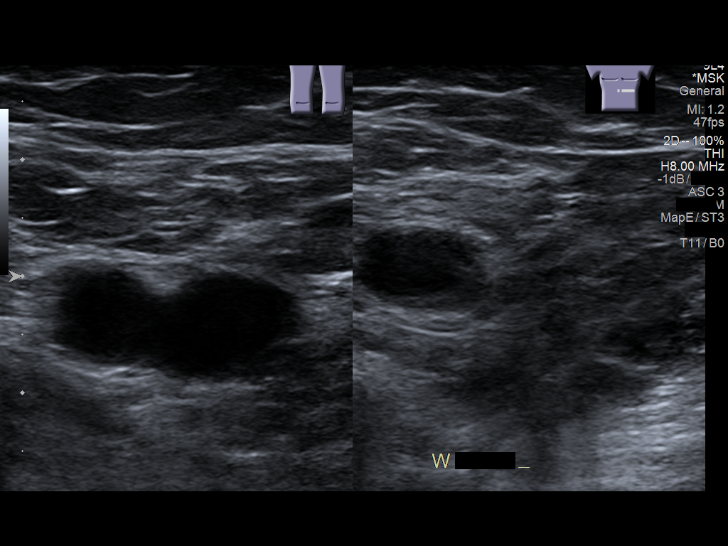
[im 3/30]
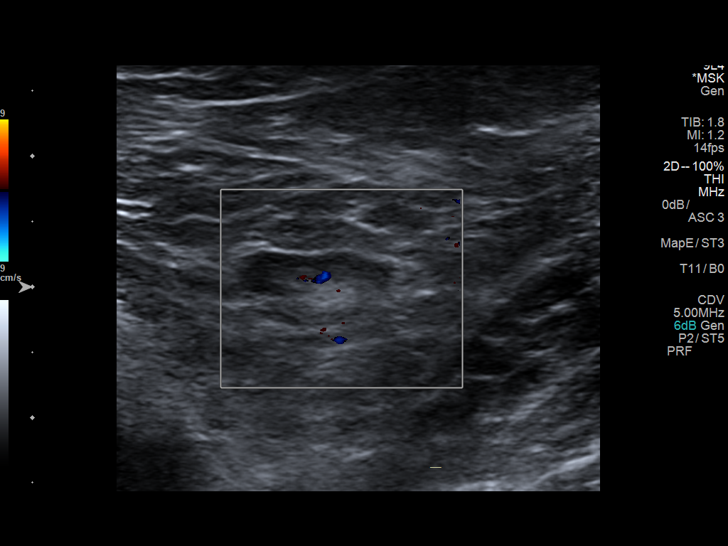
[im 5/30]
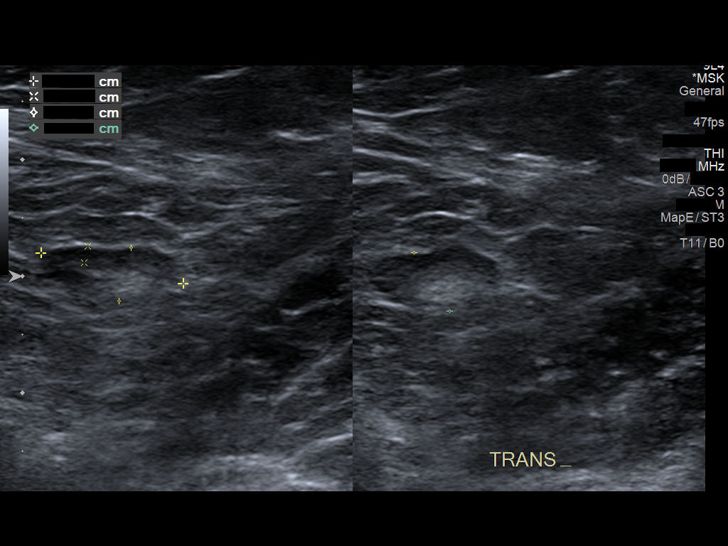
[im 8/30]
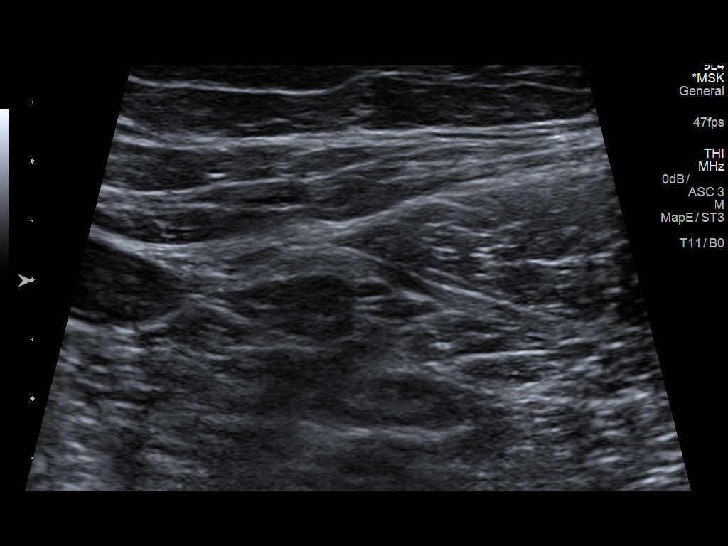
[im 10/30]
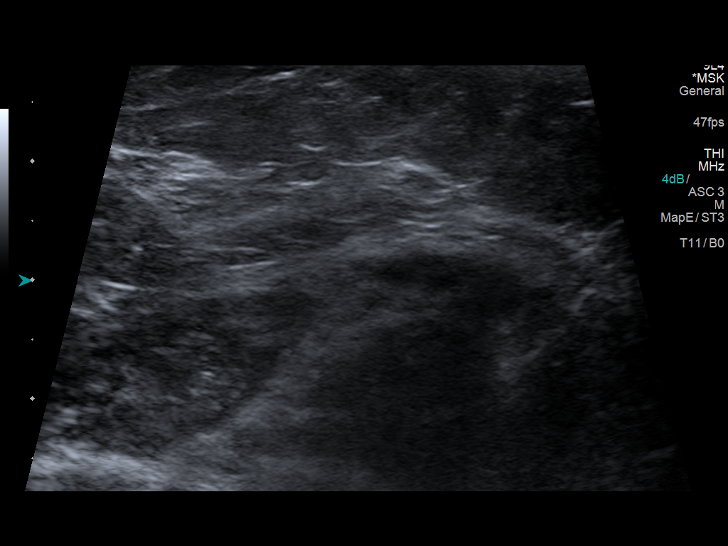
[im 11/30]
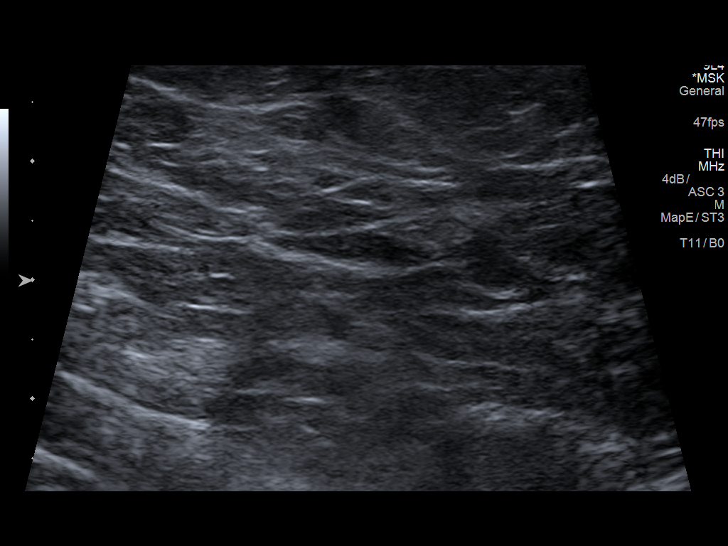
[im 14/30]
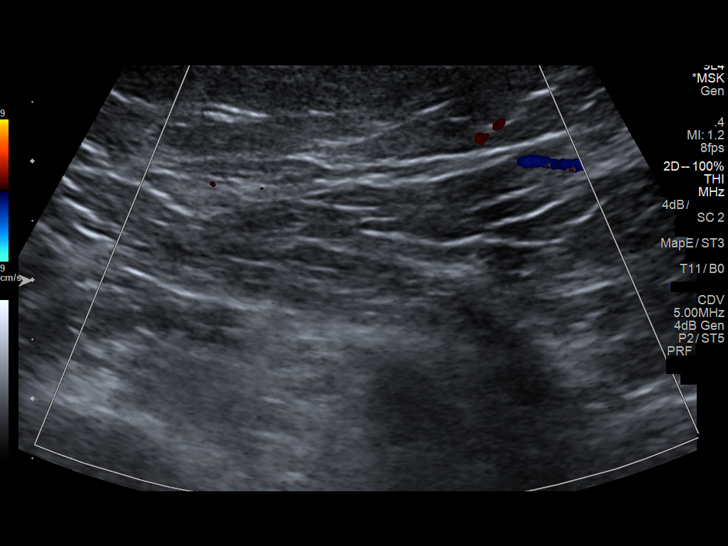
[im 16/30]
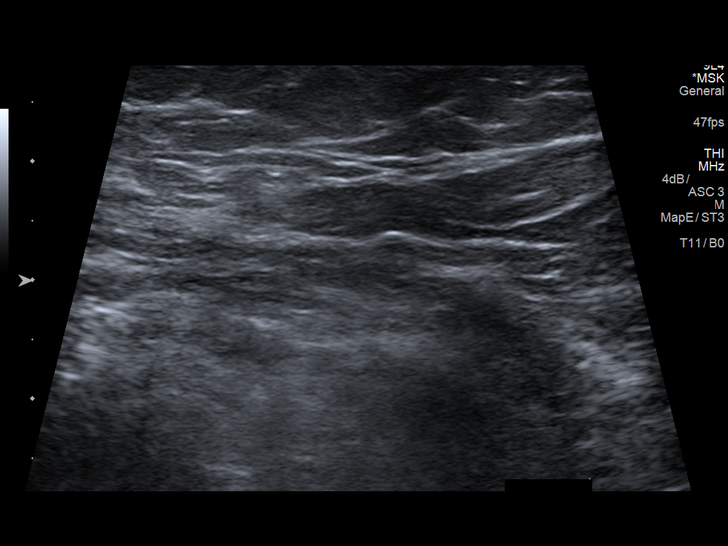
[im 19/30]
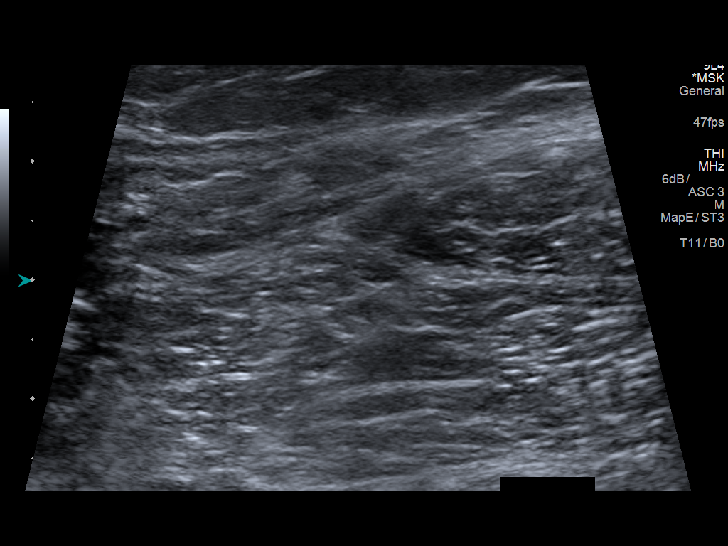
[im 20/30]
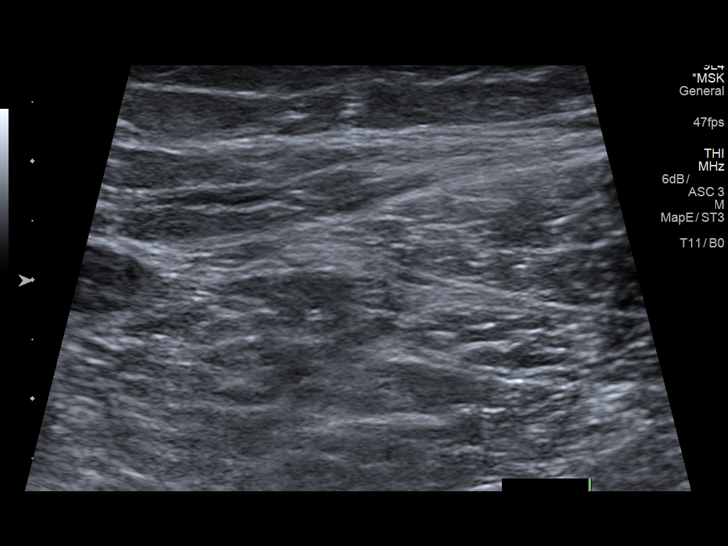
[im 22/30]
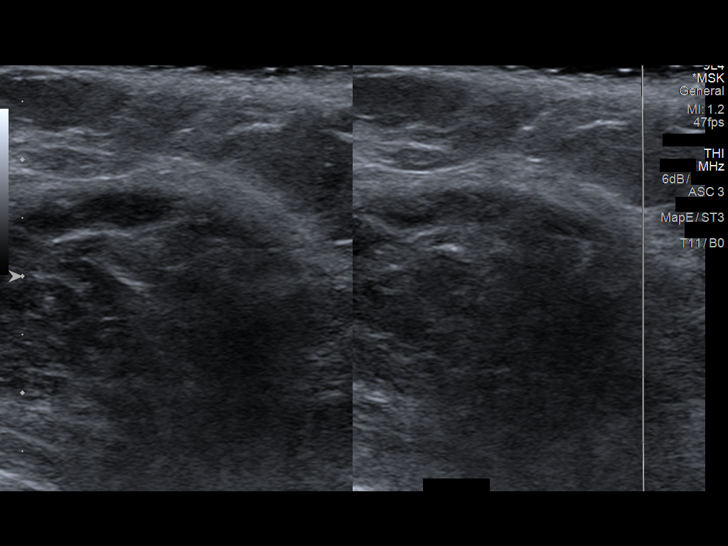
[im 25/30]
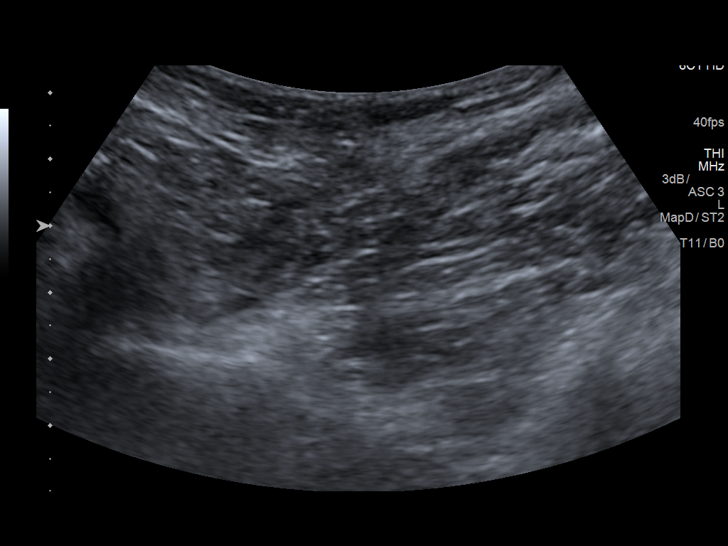
[im 27/30]
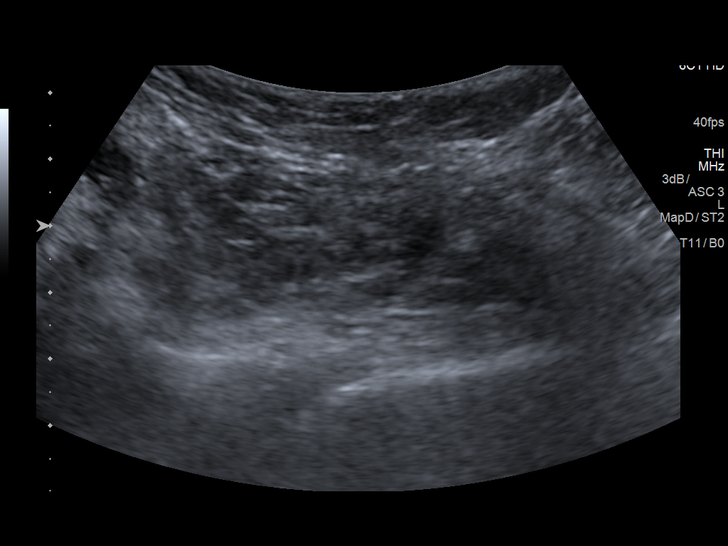
[im 30/30]
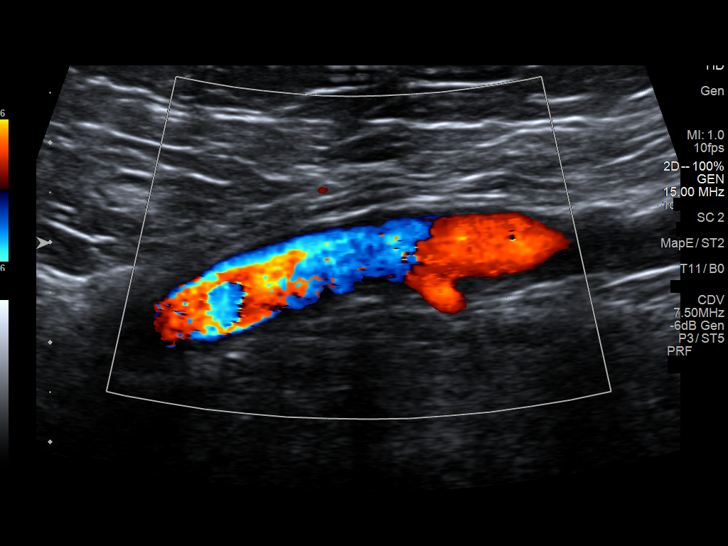

[14 of 25 positions shown; findings below may reference images not displayed]

FINDINGS: Limited sonography was performed at the site of clinical concern at
the RIGHT inguinal region.

At this site a normal-sized RIGHT inguinal lymph node is identified
measuring 6 mm in short axis, normal. No additional mass,
adenopathy, or fluid collection. No inguinal hernia identified.
IMPRESSION: Single normal sized RIGHT inguinal lymph node.

Otherwise negative exam, with no additional sonographic
abnormalities identified.

If patient has persistent symptoms, consider cross-sectional imaging
by CT or MR.

## 2019-03-07 NOTE — Progress Notes (Signed)
Patient: [name], Female    DOB: September 23, 1955, 64 y.o.   MRN: [id number] Visit Date: 03/08/2019  Today's Provider: Mar Daring, PA-C   Chief Complaint  Patient presents with  . Annual Exam   Subjective:    I,Joseline E. Rosas,RMA am acting as a Education administrator for [employer], PA-C.  Patient had AWV with [facility] 01/02/19   Annual physical exam [name] is a 63 y.o. female who presents today for health maintenance and complete physical. She feels well. She reports exercising. She reports she is sleeping well. -----------------------------------------------------------------   Review of Systems  Constitutional: Negative.   HENT: Negative.   Eyes: Negative.   Respiratory: Negative.   Cardiovascular: Negative.   Gastrointestinal: Negative.   Endocrine: Negative.   Genitourinary: Negative.   Musculoskeletal: Positive for arthralgias and back pain.  Skin: Negative.   Allergic/Immunologic: Negative.   Neurological: Negative.   Hematological: Negative.   Psychiatric/Behavioral: Negative.     Social History      She  reports that she has never smoked. She has never used smokeless tobacco. She reports that she does not drink alcohol or use drugs.       Social History   Socioeconomic History  . Marital status: Single    Spouse name: Not on file  . Number of children: 3  . Years of education: Not on file  . Highest education level: Some college, no degree  Occupational History  . Occupation: disable  Social Needs  . Financial resource strain: Hard  . Food insecurity    Worry: Never true    Inability: Never true  . Transportation needs    Medical: No    Non-medical: No  Tobacco Use  . Smoking status: Never Smoker  . Smokeless tobacco: Never Used  Substance and Sexual Activity  . Alcohol use: No  . Drug use: No  . Sexual activity: Not on file  Lifestyle  . Physical activity    Days per week: 0 days    Minutes per session: 0 min  .  Stress: Very much  Relationships  . Social Herbalist on phone: Patient refused    Gets together: Patient refused    Attends religious service: Patient refused    Active member of club or organization: Patient refused    Attends meetings of clubs or organizations: Patient refused    Relationship status: Patient refused  Other Topics Concern  . Not on file  Social History Narrative  . Not on file    Past Medical History:  Diagnosis Date  . Allergy   . Anemia    takes Ferrous Sulfate daily  . Arthritis   . GERD (gastroesophageal reflux disease)    takes OTC med  . Hyperlipidemia   . Hypertension    takes Amlodipine daily  . Low back pain    occasionally     Patient Active Problem List   Diagnosis Date Noted  . Chronic right-sided low back pain without sciatica 04/17/2018  . History of CVA (cerebrovascular accident) 03/14/2017  . Gastritis   . BPPV (benign paroxysmal positional vertigo) 03/24/2015  . Right hip pain 01/07/2015  . Absolute anemia 01/06/2015  . BMI 28.0-28.9,adult 01/06/2015  . Cervical disc disease 01/06/2015  . Atypical chest pain 01/06/2015  . Generalized pruritus 01/06/2015  . Acid reflux 01/06/2015  . Cervicogenic headache 01/06/2015  . Hypercholesteremia 01/06/2015  . Low back pain 01/06/2015  . Pseudoarthrosis of  cervical spine ([place]) 08/26/2013  . Accumulation of fluid in tissues 12/24/2009  . Avitaminosis D 10/15/2009  . Cardiac conduction disorder 09/30/2008  . Essential (primary) hypertension 09/30/2008  . Blood in the urine 09/30/2008    Past Surgical History:  Procedure Laterality Date  . ANTERIOR CERVICAL DECOMP/DISCECTOMY FUSION  10/19/2011   Procedure: ANTERIOR CERVICAL DECOMPRESSION/DISCECTOMY FUSION 1 LEVEL/HARDWARE REMOVAL;  Surgeon: Elaina Hoops, MD;  Location: [facility];  Service: Neurosurgery;  Laterality: N/A;  Cervical four - five  Anterior cervical decompression fusion with redo at six - seven  Rm 33  . CARPAL  TUNNEL RELEASE    . COLONOSCOPY  08/2014   polyps  . ESOPHAGOGASTRODUODENOSCOPY (EGD) WITH PROPOFOL N/A 06/23/2015   Procedure: ESOPHAGOGASTRODUODENOSCOPY (EGD) WITH PROPOFOL;  Surgeon: [name], MD;  Location: [facility];  Service: Endoscopy;  Laterality: N/A;  . NECK SURGERY    . POSTERIOR CERVICAL FUSION/FORAMINOTOMY N/A 08/26/2013   Procedure: C/4-5,C/5-6,C/6-7 Posterior Cervical Fusion w/lateral mass fixation;  Surgeon: Elaina Hoops, MD;  Location: [place] [facility];  Service: Neurosurgery;  Laterality: N/A;  . TUBAL LIGATION      Family History        Family Status  Relation Name Status  . Mother  Alive  . Daughter  Deceased       unknown causes  . Father  Other  . Sister  Alive  . Brother  Alive  . Son  Alive  . Sister  Alive  . Sister  Alive  . Son  Alive  . Neg Hx  (Not Specified)        Her family history includes Heart disease in her mother; Hypertension in her mother. There is no history of Breast cancer.      Allergies  Allergen Reactions  . Aspirin Nausea Only  . Hydrochlorothiazide     Rash  . Ibuprofen Other (See Comments)    Pt says her doctor doesn't want her taking this with the blood thinner she's taking  . Lisinopril     Other reaction(s): Unknown  . Sulfa Antibiotics Hives and Nausea Only     Current Outpatient Medications:  .  acetaminophen (TYLENOL) 500 MG tablet, Take 2 tablets (1,000 mg total) by mouth every 6 (six) hours as needed., Disp: 90 tablet, Rfl: 1 .  albuterol (VENTOLIN HFA) 108 (90 Base) MCG/ACT inhaler, INHALE 2 PUFFS INTO THE LUNGS EVERY 6 HOURS AS NEEDED FOR WHEEZING OR SHORTNESS OF BREATH, Disp: 25.5 g, Rfl: 1 .  amLODipine (NORVASC) 10 MG tablet, TAKE 1 TABLET BY MOUTH EVERY DAY, Disp: 90 tablet, Rfl: 2 .  atorvastatin (LIPITOR) 40 MG tablet, TAKE 1 TABLET BY MOUTH EVERY DAY, Disp: 90 tablet, Rfl: 1 .  azelastine (OPTIVAR) 0.05 % ophthalmic solution, Place 1 drop into both eyes 2 (two) times daily., Disp: 6 mL, Rfl: 12 .   Cholecalciferol (VITAMIN D) 2000 UNITS tablet, Take 2,000 Units by mouth daily., Disp: , Rfl:  .  clopidogrel (PLAVIX) 75 MG tablet, TAKE 1 TABLET BY MOUTH EVERY DAY, Disp: 90 tablet, Rfl: 1 .  DEXILANT 60 MG capsule, TAKE 1 CAPSULE BY MOUTH EVERY DAY, Disp: 90 capsule, Rfl: 2 .  ferrous sulfate 325 (65 FE) MG tablet, Take 1 tablet (325 mg total) by mouth daily with breakfast., Disp: 90 tablet, Rfl: 1 .  fluticasone (FLONASE) 50 MCG/ACT nasal spray, USE 2 SPRAYS IN EACH NOSTRIL ONCE DAILY, Disp: 16 g, Rfl: 5 .  Omega-3 Fatty Acids (FISH OIL) 1200 MG CAPS,  Take 1 capsule by mouth daily., Disp: , Rfl:  .  Potassium 75 MG TABS, Take by mouth., Disp: , Rfl:  .  sucralfate (CARAFATE) 1 g tablet, TAKE 1 TABLET BY MOUTH FOUR TIMES DAILY AT BEDTIME WITH MEALS, Disp: 360 tablet, Rfl: 0 .  cyclobenzaprine (FLEXERIL) 5 MG tablet, Take 1 tablet (5 mg total) by mouth 3 (three) times daily as needed for muscle spasms. (Patient not taking: Reported on 01/02/2019), Disp: 15 tablet, Rfl: 0 .  loratadine (CLARITIN) 10 MG tablet, Take 1 tablet (10 mg total) by mouth daily. (Patient not taking: Reported on 01/02/2019), Disp: 30 tablet, Rfl: 3   Patient Care Team: Mar Daring, PA-C as PCP - General (Family Medicine)    Objective:    Vitals: BP 135/74 (BP Location: Left Arm, Patient Position: Sitting, Cuff Size: Large)   Pulse 69   Temp (!) 97 F (36.1 C) (Other (Comment)) Comment (Src): forehead  Resp 16   Ht '5\' 5"'$  (1.651 m)   Wt 179 lb 9.6 oz (81.5 kg)   BMI 29.89 kg/m    Vitals:   03/08/19 1012  BP: 135/74  Pulse: 69  Resp: 16  Temp: (!) 97 F (36.1 C)  TempSrc: Other (Comment)  Weight: 179 lb 9.6 oz (81.5 kg)  Height: '5\' 5"'$  (1.651 m)     Physical Exam Vitals signs reviewed.  Constitutional:      General: She is not in acute distress.    Appearance: Normal appearance. She is well-developed. She is not ill-appearing or diaphoretic.  HENT:     Head: Normocephalic and atraumatic.      Right Ear: Tympanic membrane, ear canal and external ear normal.     Left Ear: Tympanic membrane, ear canal and external ear normal.     Nose: Nose normal.     Mouth/Throat:     Mouth: Mucous membranes are moist.     Pharynx: Oropharynx is clear. No oropharyngeal exudate.  Eyes:     General: No scleral icterus.       Right eye: No discharge.        Left eye: No discharge.     Extraocular Movements: Extraocular movements intact.     Conjunctiva/sclera: Conjunctivae normal.     Pupils: Pupils are equal, round, and reactive to light.  Neck:     Musculoskeletal: Normal range of motion and neck supple.     Thyroid: No thyromegaly.     Vascular: No carotid bruit or JVD.     Trachea: No tracheal deviation.  Cardiovascular:     Rate and Rhythm: Normal rate and regular rhythm.     Pulses: Normal pulses.     Heart sounds: Normal heart sounds. No murmur. No friction rub. No gallop.   Pulmonary:     Effort: Pulmonary effort is normal. No respiratory distress.     Breath sounds: Normal breath sounds. No wheezing or rales.  Chest:     Chest wall: No tenderness.  Abdominal:     General: Bowel sounds are normal. There is no distension.     Palpations: Abdomen is soft. There is no mass.     Tenderness: There is no abdominal tenderness. There is no guarding or rebound.  Musculoskeletal: Normal range of motion.        General: No tenderness.     Right lower leg: No edema.     Left lower leg: No edema.  Lymphadenopathy:     Cervical: No cervical adenopathy.  Skin:  General: Skin is warm and dry.     Capillary Refill: Capillary refill takes less than 2 seconds.     Findings: No rash.  Neurological:     General: No focal deficit present.     Mental Status: She is alert and oriented to person, place, and time. Mental status is at baseline.     Cranial Nerves: No cranial nerve deficit.     Motor: No weakness.     Coordination: Coordination normal.     Gait: Gait normal.  Psychiatric:         Mood and Affect: Mood normal.        Behavior: Behavior normal.        Thought Content: Thought content normal.        Judgment: Judgment normal.     Fall Risk  01/02/2019 12/25/2017 12/07/2016 09/08/2015 01/07/2015  Falls in the past year? 0 No No No No   Depression Screen PHQ 2/9 Scores 01/02/2019 12/25/2017 12/07/2016 12/07/2016  PHQ - 2 Score 4 0 0 0  PHQ- 9 Score 6 - 0 -   Cognitive Function: Declined 01/02/19 with NHA. 6CIT Screen 12/07/2016  What Year? 0 points  What month? 0 points  What time? 0 points  Count back from 20 0 points  Months in reverse 0 points  Repeat phrase 0 points  Total Score 0       Assessment & Plan:     Routine Health Maintenance and Physical Exam  Exercise Activities and Dietary recommendations Goals    . Increase water intake     Recommend increasing water intake to 4 glasses a day.       Immunization History  Administered Date(s) Administered  . Influenza-Unspecified 03/07/2017, 06/14/2018  . MMR 09/09/1987  . Tdap 09/30/2008, 01/02/2019    Health Maintenance  Topic Date Due  . INFLUENZA VACCINE  02/23/2019  . COLONOSCOPY  10/28/2019  . PAP SMEAR-Modifier  12/10/2019  . MAMMOGRAM  01/20/2021  . TETANUS/TDAP  01/01/2029  . Hepatitis C Screening  Completed  . HIV Screening  Completed     Discussed health benefits of physical activity, and encouraged her to engage in regular exercise appropriate for her age and condition.    1. Annual physical exam Normal physical exam today. Will check labs as below and f/u pending lab results. If labs are stable and WNL she will not need to have these rechecked for one year at her next annual physical exam. She is to call the office in the meantime if she has any acute issue, questions or concerns. - CBC with Differential/Platelet - Comprehensive metabolic panel - Hemoglobin A1c - Lipid panel - TSH  2. Essential (primary) hypertension Stable. Continue current medical treatment plan. Will  check labs as below and f/u pending results. - CBC with Differential/Platelet - Comprehensive metabolic panel - Hemoglobin A1c - Lipid panel - TSH  3. Hypercholesteremia Stable. Continue current medical treatment plan. Will check labs as below and f/u pending results. - Comprehensive metabolic panel - Hemoglobin A1c - Lipid panel  4. Tinea versicolor New. Worse on back and legs. Will treat with ketoconazole shampoo for body as below. Once completed can use selsum blue OTC.  - ketoconazole (NIZORAL) 2 % shampoo; Apply 1 application topically 2 (two) times a week.  Dispense: 120 mL; Refill: 0  --------------------------------------------------------------------    Mar Daring, PA-C  [facility]

## 2019-03-08 ENCOUNTER — Other Ambulatory Visit: Payer: Self-pay

## 2019-03-08 ENCOUNTER — Encounter: Payer: Self-pay | Admitting: Physician Assistant

## 2019-03-08 ENCOUNTER — Ambulatory Visit (INDEPENDENT_AMBULATORY_CARE_PROVIDER_SITE_OTHER): Payer: Medicare Other | Admitting: Physician Assistant

## 2019-03-08 VITALS — BP 135/74 | HR 69 | Temp 97.0°F | Resp 16 | Ht 65.0 in | Wt 179.6 lb

## 2019-03-08 DIAGNOSIS — B360 Pityriasis versicolor: Secondary | ICD-10-CM

## 2019-03-08 DIAGNOSIS — Z0000 Encounter for general adult medical examination without abnormal findings: Secondary | ICD-10-CM

## 2019-03-08 DIAGNOSIS — E7800 Pure hypercholesterolemia, unspecified: Secondary | ICD-10-CM | POA: Diagnosis not present

## 2019-03-08 DIAGNOSIS — I10 Essential (primary) hypertension: Secondary | ICD-10-CM | POA: Diagnosis not present

## 2019-03-08 MED ORDER — KETOCONAZOLE 2 % EX SHAM: 1.0000 "application " | MEDICATED_SHAMPOO | CUTANEOUS | 0 refills | Status: DC

## 2019-03-08 NOTE — Patient Instructions (Signed)
Tinea Versicolor  Tinea versicolor is a common fungal infection of the skin. It causes a rash that appears as light or dark patches on the skin. The rash most often occurs on the chest, back, neck, or upper arms. This condition is more common during warm weather. Other than affecting how your skin looks, tinea versicolor usually does not cause other problems. In most cases, the infection goes away in a few weeks with treatment. It may take a few months for the patches on your skin to return to your usual skin color. What are the causes? This condition occurs when a type of fungus that is normally present on the skin starts to overgrow. This fungus is a kind of yeast. The exact cause of the overgrowth is not known. This condition cannot be passed from one person to another (it is not contagious). What increases the risk? This condition is more likely to develop when certain factors are present, such as:  Heat and humidity.  Sweating too much.  Hormone changes.  Oily skin.  A weak disease-fighting system (immunesystem). What are the signs or symptoms? Symptoms of this condition include:  A rash of light or dark patches on your skin. The rash may have: ? Patches of tan or pink spots (on light skin). ? Patches of white or brown spots (on dark skin). ? Patches of skin that do not tan. ? Well-marked edges. ? Scales on the discolored areas.  Mild itching. How is this diagnosed? A health care provider can usually diagnose this condition by looking at your skin. During the exam, he or she may use ultraviolet (UV) light to see how much of your skin has been affected. In some cases, a skin sample may be taken by scraping the rash. This sample will be viewed under a microscope to check for yeast overgrowth. How is this treated? Treatment for this condition may include:  Dandruff shampoo that is applied to the affected skin during showers or bathing.  Over-the-counter medicated skin cream,  lotion, or soaps.  Prescription antifungal medicine in the form of skin cream or pills.  Medicine to help reduce itching. Follow these instructions at home:  Take over-the-counter and prescription medicines only as told by your health care provider.  Apply dandruff shampoo to the affected area if your health care provider told you to do that. You may be instructed to scrub the affected skin for several minutes each day.  Do not scratch the affected area of skin.  Avoid hot and humid conditions.  Do not use tanning booths.  Try to avoid sweating a lot. Contact a health care provider if:  Your symptoms get worse.  You have a fever.  You have redness, swelling, or pain at the site of your rash.  You have fluid or blood coming from your rash.  Your rash feels warm to the touch.  You have pus or a bad smell coming from your rash.  Your rash returns (recurs) after treatment. Summary  Tinea versicolor is a common fungal infection of the skin. It causes a rash that appears as light or dark patches on the skin.  The rash most often occurs on the chest, back, neck, or upper arms.  A health care provider can usually diagnose this condition by looking at your skin.  Treatment may include applying shampoo to the skin and taking or applying medicines. This information is not intended to replace advice given to you by your health care provider. Make sure you   discuss any questions you have with your health care provider. Document Released: 07/08/2000 Document Revised: 06/23/2017 Document Reviewed: 03/14/2017 Elsevier Patient Education  2020 [employer] Inc[employer]  

## 2019-03-09 LAB — COMPREHENSIVE METABOLIC PANEL
ALT: 6 IU/L (ref 0–32)
AST: 12 IU/L (ref 0–40)
Albumin/Globulin Ratio: 1.5 (ref 1.2–2.2)
Albumin: 4.4 g/dL (ref 3.8–4.8)
Alkaline Phosphatase: 102 IU/L (ref 39–117)
BUN/Creatinine Ratio: 14 (ref 12–28)
BUN: 8 mg/dL (ref 8–27)
Bilirubin Total: 0.2 mg/dL (ref 0.0–1.2)
CO2: 26 mmol/L (ref 20–29)
Calcium: 9.6 mg/dL (ref 8.7–10.3)
Chloride: 101 mmol/L (ref 96–106)
Creatinine, Ser: 0.59 mg/dL (ref 0.57–1.00)
GFR calc Af Amer: 114 mL/min/{1.73_m2} (ref >59)
GFR calc non Af Amer: 99 mL/min/{1.73_m2} (ref >59)
Globulin, Total: 2.9 g/dL (ref 1.5–4.5)
Glucose: 102 mg/dL — ABNORMAL HIGH (ref 65–99)
Potassium: 3.5 mmol/L (ref 3.5–5.2)
Sodium: 144 mmol/L (ref 134–144)
Total Protein: 7.3 g/dL (ref 6.0–8.5)

## 2019-03-09 LAB — CBC WITH DIFFERENTIAL/PLATELET
Basophils Absolute: 0 10*3/uL (ref 0.0–0.2)
Basos: 0 %
EOS (ABSOLUTE): 0.1 10*3/uL (ref 0.0–0.4)
Eos: 1 %
Hematocrit: 42.9 % (ref 34.0–46.6)
Hemoglobin: 14 g/dL (ref 11.1–15.9)
Immature Grans (Abs): 0 10*3/uL (ref 0.0–0.1)
Immature Granulocytes: 0 %
Lymphocytes Absolute: 2.9 10*3/uL (ref 0.7–3.1)
Lymphs: 43 %
MCH: 28.1 pg (ref 26.6–33.0)
MCHC: 32.6 g/dL (ref 31.5–35.7)
MCV: 86 fL (ref 79–97)
Monocytes Absolute: 0.4 10*3/uL (ref 0.1–0.9)
Monocytes: 6 %
Neutrophils Absolute: 3.4 10*3/uL (ref 1.4–7.0)
Neutrophils: 50 %
Platelets: 331 10*3/uL (ref 150–450)
RBC: 4.98 x10E6/uL (ref 3.77–5.28)
RDW: 13.2 % (ref 11.7–15.4)
WBC: 6.8 10*3/uL (ref 3.4–10.8)

## 2019-03-09 LAB — LIPID PANEL
Chol/HDL Ratio: 3.2 ratio (ref 0.0–4.4)
Cholesterol, Total: 150 mg/dL (ref 100–199)
HDL: 47 mg/dL (ref >39)
LDL Calculated: 79 mg/dL (ref 0–99)
Triglycerides: 118 mg/dL (ref 0–149)
VLDL Cholesterol Cal: 24 mg/dL (ref 5–40)

## 2019-03-09 LAB — HEMOGLOBIN A1C
Est. average glucose Bld gHb Est-mCnc: 128 mg/dL
Hgb A1c MFr Bld: 6.1 % — ABNORMAL HIGH (ref 4.8–5.6)

## 2019-03-09 LAB — TSH: TSH: 0.851 u[IU]/mL (ref 0.450–4.500)

## 2019-03-11 ENCOUNTER — Telehealth: Payer: Self-pay

## 2019-03-11 NOTE — Telephone Encounter (Signed)
-----   Message from Mar Daring, PA-C sent at 03/11/2019  8:35 AM EDT ----- All labs are within normal limits and stable.  Thanks! -JB

## 2019-03-11 NOTE — Telephone Encounter (Signed)
Patient advised as directed below. 

## 2019-03-28 ENCOUNTER — Telehealth: Payer: Self-pay | Admitting: Physician Assistant

## 2019-03-28 NOTE — Telephone Encounter (Signed)
She can take benadryl every 4 hours and please go to urgent care or ER if worsening or signs of difficulty breathing develop

## 2019-03-28 NOTE — Telephone Encounter (Signed)
Pt called saying she has been breaking out in hives starting 3 am this morning and has continued throughout today.  No difficulty breathing.  Not taken any benadryl.  Has not eaten anything out of the ordinary and has not started any new medications.  There are no more appts today or tomorrow  CB#  [phone]   Con Memos

## 2019-03-28 NOTE — Telephone Encounter (Signed)
Advised 

## 2019-03-28 NOTE — Telephone Encounter (Signed)
Please advise 

## 2019-03-29 ENCOUNTER — Encounter: Payer: Self-pay | Admitting: Physician Assistant

## 2019-03-29 ENCOUNTER — Ambulatory Visit (INDEPENDENT_AMBULATORY_CARE_PROVIDER_SITE_OTHER): Payer: Medicare Other | Admitting: Physician Assistant

## 2019-03-29 ENCOUNTER — Other Ambulatory Visit: Payer: Self-pay

## 2019-03-29 VITALS — BP 140/73 | HR 64 | Temp 97.1°F | Resp 16 | Wt 180.0 lb

## 2019-03-29 DIAGNOSIS — W57XXXA Bitten or stung by nonvenomous insect and other nonvenomous arthropods, initial encounter: Secondary | ICD-10-CM

## 2019-03-29 DIAGNOSIS — L539 Erythematous condition, unspecified: Secondary | ICD-10-CM

## 2019-03-29 MED ORDER — PREDNISONE 20 MG PO TABS: 20.0000 mg | ORAL_TABLET | Freq: Every day | ORAL | 0 refills | Status: DC

## 2019-03-29 NOTE — Patient Instructions (Signed)

## 2019-03-29 NOTE — Progress Notes (Signed)
Patient: [name] Female    DOB: 04-12-1956   63 y.o.   MRN: [id number] Visit Date: 03/29/2019  Today's Provider: Mar Daring, PA-C   No chief complaint on file.  Subjective:     HPI Patient here today with c/o hives. She has been breaking out in hives starting 3 am yesterday morning continued throughout the day.Reports no difficulty breathing. Has not eaten anything out of the ordinary and has not started any new medications.Patient was advised yesterday to take benadryl every 4 hours. Reports that she has taken it twice a day. She reports she has been staying at her mom's house to help her out and was sleeping on an air mattress, but her mattress got a hole. So recently she has been sleeping on the floor on two pillows. She was lying in the floor and started feeling like something was biting her on her neck and back. She jumped up and looked but didn't find anything. Then a little later she started getting the hives on her mid to lower back. She reports the hives improved with the benadryl but that she still feels she has itchy bumps on her neck and upper back.   Allergies  Allergen Reactions  . Aspirin Nausea Only  . Hydrochlorothiazide     Rash  . Ibuprofen Other (See Comments)    Pt says her doctor doesn't want her taking this with the blood thinner she's taking  . Lisinopril     Other reaction(s): Unknown  . Sulfa Antibiotics Hives and Nausea Only     Current Outpatient Medications:  .  acetaminophen (TYLENOL) 500 MG tablet, Take 2 tablets (1,000 mg total) by mouth every 6 (six) hours as needed., Disp: 90 tablet, Rfl: 1 .  albuterol (VENTOLIN HFA) 108 (90 Base) MCG/ACT inhaler, INHALE 2 PUFFS INTO THE LUNGS EVERY 6 HOURS AS NEEDED FOR WHEEZING OR SHORTNESS OF BREATH, Disp: 25.5 g, Rfl: 1 .  amLODipine (NORVASC) 10 MG tablet, TAKE 1 TABLET BY MOUTH EVERY DAY, Disp: 90 tablet, Rfl: 2 .  atorvastatin (LIPITOR) 40 MG tablet, TAKE 1 TABLET BY MOUTH EVERY DAY,  Disp: 90 tablet, Rfl: 1 .  azelastine (OPTIVAR) 0.05 % ophthalmic solution, Place 1 drop into both eyes 2 (two) times daily., Disp: 6 mL, Rfl: 12 .  Cholecalciferol (VITAMIN D) 2000 UNITS tablet, Take 2,000 Units by mouth daily., Disp: , Rfl:  .  clopidogrel (PLAVIX) 75 MG tablet, TAKE 1 TABLET BY MOUTH EVERY DAY, Disp: 90 tablet, Rfl: 1 .  DEXILANT 60 MG capsule, TAKE 1 CAPSULE BY MOUTH EVERY DAY, Disp: 90 capsule, Rfl: 2 .  ferrous sulfate 325 (65 FE) MG tablet, Take 1 tablet (325 mg total) by mouth daily with breakfast., Disp: 90 tablet, Rfl: 1 .  fluticasone (FLONASE) 50 MCG/ACT nasal spray, USE 2 SPRAYS IN EACH NOSTRIL ONCE DAILY, Disp: 16 g, Rfl: 5 .  ketoconazole (NIZORAL) 2 % shampoo, Apply 1 application topically 2 (two) times a week., Disp: 120 mL, Rfl: 0 .  Omega-3 Fatty Acids (FISH OIL) 1200 MG CAPS, Take 1 capsule by mouth daily., Disp: , Rfl:  .  Potassium 75 MG TABS, Take by mouth., Disp: , Rfl:  .  sucralfate (CARAFATE) 1 g tablet, TAKE 1 TABLET BY MOUTH FOUR TIMES DAILY AT BEDTIME WITH MEALS, Disp: 360 tablet, Rfl: 0 .  cyclobenzaprine (FLEXERIL) 5 MG tablet, Take 1 tablet (5 mg total) by mouth 3 (three) times daily as needed for  muscle spasms. (Patient not taking: Reported on 01/02/2019), Disp: 15 tablet, Rfl: 0 .  loratadine (CLARITIN) 10 MG tablet, Take 1 tablet (10 mg total) by mouth daily. (Patient not taking: Reported on 01/02/2019), Disp: 30 tablet, Rfl: 3  Review of Systems  Constitutional: Negative.   Respiratory: Negative.   Cardiovascular: Negative.   Gastrointestinal: Negative.   Skin: Positive for rash.  Neurological: Negative.     Social History   Tobacco Use  . Smoking status: Never Smoker  . Smokeless tobacco: Never Used  Substance Use Topics  . Alcohol use: No      Objective:   BP 140/73 (BP Location: Left Arm, Patient Position: Sitting, Cuff Size: Large)   Pulse 64   Temp (!) 97.1 F (36.2 C) (Other (Comment))   Resp 16   Wt 180 lb (81.6 kg)    BMI 29.95 kg/m  Vitals:   03/29/19 1057  BP: 140/73  Pulse: 64  Resp: 16  Temp: (!) 97.1 F (36.2 C)  TempSrc: Other (Comment)  Weight: 180 lb (81.6 kg)  Body mass index is 29.95 kg/m.   Physical Exam Vitals signs reviewed.  Constitutional:      General: She is not in acute distress.    Appearance: Normal appearance. She is well-developed. She is not ill-appearing or diaphoretic.  Neck:     Musculoskeletal: Normal range of motion and neck supple.  Cardiovascular:     Rate and Rhythm: Normal rate and regular rhythm.     Heart sounds: Normal heart sounds. No murmur. No friction rub. No gallop.   Pulmonary:     Effort: Pulmonary effort is normal. No respiratory distress.     Breath sounds: Normal breath sounds. No wheezing or rales.  Skin:    General: Skin is warm and dry.     Findings: Rash present. Rash is papular (papular rash on erythematous bases diffuse on neck and upper back and shoulders. Some bumps are in linear lesions with 2-3 in a line ).  Neurological:     Mental Status: She is alert.      No results found for any visits on 03/29/19.     Assessment & Plan    1. Bug bite, initial encounter Suspicious for bed bugs. Advised to have exterminator evaluate house. Wash linens and clothes in hot water. Prednisone will be prescribed as below. May continue benadryl as tolerated or change to topical benadryl cream if causing drowsiness. Call if not improving.  - predniSONE (DELTASONE) 20 MG tablet; Take 1 tablet (20 mg total) by mouth daily with breakfast.  Dispense: 10 tablet; Refill: 0     Mar Daring, PA-C  [facility]

## 2019-05-15 NOTE — Progress Notes (Signed)
Patient: [name] Female    DOB: October 26, 1955   63 y.o.   MRN: [id number] Visit Date: 05/16/2019  Today's Provider: Mar Daring, PA-C   Chief Complaint  Patient presents with  . Knee Pain   Subjective:     Knee Pain  The incident occurred more than 1 week ago. There was no injury mechanism. The pain is present in the right knee. The quality of the pain is described as aching. The pain is at a severity of 9/10. Pain severity now: moderate and Severe. The pain has been constant since onset. Associated symptoms include an inability to bear weight. Pertinent negatives include no loss of motion, loss of sensation, muscle weakness, numbness or tingling. She reports no foreign bodies present. The symptoms are aggravated by movement and weight bearing (Bending her knee is the what hurts more). She has tried acetaminophen and heat (Brace) for the symptoms. The treatment provided mild relief.    She does report that approx 2-3 weeks ago she had started doing squats for exercise then about 1 week ago she noticed the pain in her upper right knee just above her patella that radiates into the quadriceps muscle on the right when she bends her knee. No numbness or tingling. No pain within the knee joint or along joint lines.    Allergies  Allergen Reactions  . Aspirin Nausea Only  . Hydrochlorothiazide     Rash  . Ibuprofen Other (See Comments)    Pt says her doctor doesn't want her taking this with the blood thinner she's taking  . Lisinopril     Other reaction(s): Unknown  . Sulfa Antibiotics Hives and Nausea Only     Current Outpatient Medications:  .  acetaminophen (TYLENOL) 500 MG tablet, Take 2 tablets (1,000 mg total) by mouth every 6 (six) hours as needed., Disp: 90 tablet, Rfl: 1 .  albuterol (VENTOLIN HFA) 108 (90 Base) MCG/ACT inhaler, INHALE 2 PUFFS INTO THE LUNGS EVERY 6 HOURS AS NEEDED FOR WHEEZING OR SHORTNESS OF BREATH, Disp: 25.5 g, Rfl: 1 .  amLODipine  (NORVASC) 10 MG tablet, TAKE 1 TABLET BY MOUTH EVERY DAY, Disp: 90 tablet, Rfl: 2 .  atorvastatin (LIPITOR) 40 MG tablet, TAKE 1 TABLET BY MOUTH EVERY DAY, Disp: 90 tablet, Rfl: 1 .  azelastine (OPTIVAR) 0.05 % ophthalmic solution, Place 1 drop into both eyes 2 (two) times daily., Disp: 6 mL, Rfl: 12 .  Cholecalciferol (VITAMIN D) 2000 UNITS tablet, Take 2,000 Units by mouth daily., Disp: , Rfl:  .  clopidogrel (PLAVIX) 75 MG tablet, TAKE 1 TABLET BY MOUTH EVERY DAY, Disp: 90 tablet, Rfl: 1 .  DEXILANT 60 MG capsule, TAKE 1 CAPSULE BY MOUTH EVERY DAY, Disp: 90 capsule, Rfl: 2 .  ferrous sulfate 325 (65 FE) MG tablet, Take 1 tablet (325 mg total) by mouth daily with breakfast., Disp: 90 tablet, Rfl: 1 .  fluticasone (FLONASE) 50 MCG/ACT nasal spray, USE 2 SPRAYS IN EACH NOSTRIL ONCE DAILY, Disp: 16 g, Rfl: 5 .  Omega-3 Fatty Acids (FISH OIL) 1200 MG CAPS, Take 1 capsule by mouth daily., Disp: , Rfl:  .  Potassium 75 MG TABS, Take by mouth., Disp: , Rfl:  .  sucralfate (CARAFATE) 1 g tablet, TAKE 1 TABLET BY MOUTH FOUR TIMES DAILY AT BEDTIME WITH MEALS, Disp: 360 tablet, Rfl: 0 .  cyclobenzaprine (FLEXERIL) 5 MG tablet, Take 1 tablet (5 mg total) by mouth 3 (three) times daily as needed  for muscle spasms. (Patient not taking: Reported on 01/02/2019), Disp: 15 tablet, Rfl: 0 .  ketoconazole (NIZORAL) 2 % shampoo, Apply 1 application topically 2 (two) times a week. (Patient not taking: Reported on 05/16/2019), Disp: 120 mL, Rfl: 0 .  loratadine (CLARITIN) 10 MG tablet, Take 1 tablet (10 mg total) by mouth daily. (Patient not taking: Reported on 01/02/2019), Disp: 30 tablet, Rfl: 3  Review of Systems  Constitutional: Negative.   Respiratory: Negative.   Cardiovascular: Negative.   Gastrointestinal: Negative.   Musculoskeletal: Positive for arthralgias, gait problem and myalgias. Negative for joint swelling.  Skin: Negative.   Neurological: Negative for tingling, weakness and numbness.    Social  History   Tobacco Use  . Smoking status: Never Smoker  . Smokeless tobacco: Never Used  Substance Use Topics  . Alcohol use: No      Objective:   BP 132/75 (BP Location: Left Arm, Patient Position: Sitting, Cuff Size: Large)   Pulse 66   Temp 97.8 F (36.6 C) (Other (Comment))   Resp 16   Wt 183 lb 9.6 oz (83.3 kg)   BMI 30.55 kg/m  Vitals:   05/16/19 0940  BP: 132/75  Pulse: 66  Resp: 16  Temp: 97.8 F (36.6 C)  TempSrc: Other (Comment)  Weight: 183 lb 9.6 oz (83.3 kg)  Body mass index is 30.55 kg/m.   Physical Exam Vitals signs reviewed.  Constitutional:      General: She is not in acute distress.    Appearance: Normal appearance. She is well-developed. She is not ill-appearing or diaphoretic.  Neck:     Musculoskeletal: Normal range of motion and neck supple.  Cardiovascular:     Rate and Rhythm: Normal rate and regular rhythm.     Heart sounds: Normal heart sounds. No murmur. No friction rub. No gallop.   Pulmonary:     Effort: Pulmonary effort is normal. No respiratory distress.     Breath sounds: Normal breath sounds. No wheezing or rales.  Musculoskeletal:     Right knee: She exhibits decreased range of motion. She exhibits no swelling, no effusion, no erythema, no LCL laxity, normal patellar mobility, no bony tenderness, normal meniscus and no MCL laxity. Tenderness found. Patellar tendon tenderness noted.     Comments: Patient had most pain with deep passive flexion when doing mcmurray's testing (felt pulling sensation from the patellar tendon that radiated up the quadriceps muscle to the hip) Negative varus/vlagus, negative anterior/posteroir drawer, negative lachman, negative mcmurray, negative patellar apprehension, negative patellar grind  Neurological:     Mental Status: She is alert.     No results found for any visits on 05/16/19.     Assessment & Plan    1. Strain of right quadriceps, initial encounter Suspect quad strain from squats  recently. Will treat with medrol dose pak as below. Advised to use moist heat. Stretches and exercises printed on AVS. Call if no improvement in 10-14 days.  - methylPREDNISolone (MEDROL) 4 MG TBPK tablet; 6 day taper; take as directed on package instructions  Dispense: 21 tablet; Refill: 0     Mar Daring, PA-C  [facility]

## 2019-05-16 ENCOUNTER — Encounter: Payer: Self-pay | Admitting: Physician Assistant

## 2019-05-16 ENCOUNTER — Ambulatory Visit (INDEPENDENT_AMBULATORY_CARE_PROVIDER_SITE_OTHER): Payer: Medicare Other | Admitting: Physician Assistant

## 2019-05-16 ENCOUNTER — Other Ambulatory Visit: Payer: Self-pay

## 2019-05-16 VITALS — BP 132/75 | HR 66 | Temp 97.8°F | Resp 16 | Wt 183.6 lb

## 2019-05-16 DIAGNOSIS — S76111A Strain of right quadriceps muscle, fascia and tendon, initial encounter: Secondary | ICD-10-CM | POA: Diagnosis not present

## 2019-05-16 MED ORDER — METHYLPREDNISOLONE 4 MG PO TBPK: ORAL_TABLET | ORAL | 0 refills | Status: DC

## 2019-05-16 NOTE — Patient Instructions (Signed)
Quadriceps Strain  A quadriceps strain is an injury to the muscles or tendons on the front of the thigh. The quadriceps muscles are used in straightening the knee and bending the hip. A strain occurs when the muscle is overstretched or overloaded. There are three types of strains:  Grade 1 is a mild strain. It involves a stretching or minor tearing of your muscle fibers or tendons. You should have little, if any, trouble using your thigh.  Grade 2 is a moderate strain. It involves a partial tearing of your muscle fibers or tendons. You will have pain and some loss of strength in your thigh.  Grade 3 is a severe strain. It involves a complete tearing of your muscle fibers or tendons. It causes severe pain and loss of strength in your thigh. Recovery will take a few weeks or longer, depending on how bad your strain is. What are the causes? This injury is caused by overextending the muscles in the thigh. What increases the risk? The following factors may make you more likely to develop this injury:  Participating in: ? Activities that involve jumping, sprinting, or sudden twisting. ? Contact sports, such as football or soccer.  Having a previous injury to your thigh or knee.  Having poor strength and flexibility.  Not warming up properly before activity.  Having one leg that is much stronger than the other.  Exercising to the point of exhaustion. What are the signs or symptoms? Symptoms of this condition include:  Sudden, severe pain in your thigh.  Pain and tenderness over your quadriceps muscles. The pain gets worse when you use these muscles.  Muscle spasm in your thigh.  Swelling in your thigh.  Bruising.  Having trouble with tasks that involve using your quadriceps muscle, such as walking.  A crackling sound when the tendon is moved or touched. How is this diagnosed? This condition is diagnosed based on:  A physical exam.  Your medical history.  Imaging tests,  such as: ? X-rays. ? Ultrasound. ? MRI. How is this treated? Treatment for this condition may include:  Resting your leg and avoiding activities that cause pain.  Taking medicine to help reduce pain and inflammation.  Applying ice to the area to relieve swelling and inflammation.  Elevating the leg to reduce or prevent swelling.  Applying a compression wrap to the muscle.  Using crutches until you can walk without pain.  Working with a physical therapist on exercises to restore strength and flexibility in your thigh. In rare cases, surgery may be needed. Follow these instructions at home: Managing pain, stiffness, and swelling   If directed, put ice on the injured area. ? Put ice in a plastic bag. ? Place a towel between your skin and the bag. ? Leave the ice on for 20 minutes, 2-3 times a day.  Raise (elevate) the injured area above the level of your heart while you are sitting or lying down. Activity  Do not use the injured leg to support your body weight until your health care provider says that you can. Use crutches as told by your health care provider.  Do exercises as told by your health care provider.  Return to your normal activities as told by your health care provider. Ask your health care provider what activities are safe for you. General instructions  Take over-the-counter and prescription medicines only as told by your health care provider.  Use compression wraps to apply pressure as told by your health care provider.  Keep all follow-up visits as told by your health care provider. This is important. How is this prevented?  Warm up and stretch before being active.  Cool down and stretch after being active.  Give your body time to rest between periods of activity.  Maintain physical fitness, including: ? Strength. ? Flexibility.  Be safe and responsible while being active. This will help you to avoid falls.  Do at least 150 minutes of  moderate-intensity exercise each week, such as brisk walking or water aerobics. Contact a health care provider if:  Your pain, bruising, or tenderness gets worse, even with treatment.  Your leg becomes weaker. Summary  A quadriceps strain is an injury to the muscles or tendons on the front of the thigh.  This injury is caused by overextending the muscles in the thigh.  Treatment may include rest, ice, medicines, and physical therapy. In rare cases, surgery may be needed. This information is not intended to replace advice given to you by your health care provider. Make sure you discuss any questions you have with your health care provider. Document Released: 07/11/2005 Document Revised: 11/02/2018 Document Reviewed: 06/07/2018 Elsevier Patient Education  2020 [place]. Quadriceps Strain Rehab Ask your health care provider which exercises are safe for you. Do exercises exactly as told by your health care provider and adjust them as directed. It is normal to feel mild stretching, pulling, tightness, or discomfort as you do these exercises. Stop right away if you feel sudden pain or your pain gets worse. Do not begin these exercises until told by your health care provider. Stretching and range-of-motion exercises These exercises warm up your muscles and joints and improve the movement and flexibility of your thigh. These exercises can also help to relieve stiffness or swelling. Heel slides  1. Lie on your back with both legs straight. If this causes back discomfort, bend the knee of your healthy leg so your foot is flat on the floor. 2. Slowly slide your left / right heel back toward your buttocks. Stop when you feel a gentle stretch in the front of your knee or thigh (quadriceps). 3. Hold this position for __________ seconds. 4. Slowly slide your left / right heel back to the starting position. Repeat __________ times. Complete this exercise __________ times a day. Quadriceps stretch,  prone  1. Lie on your abdomen on a firm surface, such as a bed or padded floor (prone position). 2. Bend your left / right knee and hold your ankle. If you cannot reach your ankle or pant leg, loop a belt around your foot and grab the belt instead. 3. Gently pull your heel toward your buttocks. Your knee should not slide out to the side. You should feel a stretch in the front of your thigh and knee (quadriceps). 4. Hold this position for __________ seconds. Repeat __________ times. Complete this exercise __________ times a day. Strengthening exercises These exercises build strength and endurance in your thigh. Endurance is the ability to use your muscles for a long time, even after your muscles get tired. Straight leg raises, supine This exercise stretches the muscles in front of your thigh (quadriceps) and the muscles that move your hips (hip flexors). Quality counts! Watch for signs that the quadriceps muscle is working to ensure that you are strengthening the correct muscles and not cheating by using healthier muscles. 1. Lie on your back (supine position) with your left / right leg extended and your other knee bent. 2. Tense the muscles in  the front of your left / right thigh. You should see your kneecap slide up or see increased dimpling just above the knee. 3. Tighten these muscles even more and raise your leg 4-6 inches (10-15 cm) off the floor. 4. Hold this position for __________ seconds. 5. Keep the thigh muscles tense as you lower your leg. 6. Relax the muscles slowly and completely after each repetition. Repeat __________ times. Complete this exercise __________ times a day. Leg raises, prone This exercise strengthens the muscles that move the hips (hip extensors). 1. Lie on your abdomen on a bed or a firm surface (prone position). Place a pillow under your hips. 2. Bend your left / right knee so your foot is straight up in the air. 3. Squeeze your buttocks muscles and lift your  left / right thigh off the bed. Do not let your back arch. 4. Hold this position for __________ seconds. 5. Slowly return to the starting position. Let your muscles relax completely before doing another repetition. Repeat __________ times. Complete this exercise __________ times a day. Wall sits Follow the directions for form closely. Knee pain can occur if your feet or knees are not placed properly. 1. Lean your back against a smooth wall or door, and walk your feet out 18-24 inches (46-61 cm) from it. 2. Place your feet hip-width apart. 3. Slowly slide down the wall or door until your knees bend __________ degrees. Keep your weight back and over your heels, not over your toes. Keep your thighs straight or pointing slightly outward. 4. Hold this position for __________ seconds. 5. Use your thigh and buttocks muscles to push yourself back up to a standing position. Keep your weight through your heels while you do this. 6. Rest for __________ seconds after each repetition. Repeat __________ times. Complete this exercise __________ times a day. This information is not intended to replace advice given to you by your health care provider. Make sure you discuss any questions you have with your health care provider. Document Released: 07/11/2005 Document Revised: 11/02/2018 Document Reviewed: 05/03/2018 Elsevier Patient Education  2020 [employer].

## 2019-07-09 ENCOUNTER — Other Ambulatory Visit: Payer: Self-pay | Admitting: Physician Assistant

## 2019-07-09 DIAGNOSIS — Z8673 Personal history of transient ischemic attack (TIA), and cerebral infarction without residual deficits: Secondary | ICD-10-CM

## 2019-07-09 DIAGNOSIS — E7800 Pure hypercholesterolemia, unspecified: Secondary | ICD-10-CM

## 2019-08-20 ENCOUNTER — Other Ambulatory Visit: Payer: Self-pay

## 2019-08-20 ENCOUNTER — Encounter: Payer: Self-pay | Admitting: Physician Assistant

## 2019-08-20 ENCOUNTER — Ambulatory Visit (INDEPENDENT_AMBULATORY_CARE_PROVIDER_SITE_OTHER): Payer: Medicare Other | Admitting: Physician Assistant

## 2019-08-20 VITALS — BP 152/74 | HR 58 | Temp 96.9°F | Wt 178.0 lb

## 2019-08-20 DIAGNOSIS — R300 Dysuria: Secondary | ICD-10-CM

## 2019-08-20 DIAGNOSIS — R3989 Other symptoms and signs involving the genitourinary system: Secondary | ICD-10-CM

## 2019-08-20 LAB — POCT URINALYSIS DIPSTICK
Bilirubin, UA: NEGATIVE
Glucose, UA: NEGATIVE
Ketones, UA: NEGATIVE
Nitrite, UA: NEGATIVE
Protein, UA: POSITIVE — AB
Spec Grav, UA: 1.015 (ref 1.010–1.025)
Urobilinogen, UA: 0.2 E.U./dL
pH, UA: 7.5 (ref 5.0–8.0)

## 2019-08-20 MED ORDER — CEPHALEXIN 500 MG PO CAPS: 500.0000 mg | ORAL_CAPSULE | Freq: Two times a day (BID) | ORAL | 0 refills | Status: AC

## 2019-08-20 NOTE — Patient Instructions (Addendum)

## 2019-08-20 NOTE — Progress Notes (Signed)
Patient: [name] Female    DOB: 10/27/1955   64 y.o.   MRN: [id number] Visit Date: 08/20/2019  Today's Provider: Trinna Post, PA-C   Chief Complaint  Patient presents with  . Dysuria   Subjective:     Dysuria  This is a new problem. The current episode started in the past 7 days. The problem occurs every urination. The problem has been unchanged. The quality of the pain is described as aching. The pain is at a severity of 5/10. The pain is mild. There has been no fever. Associated symptoms include flank pain, frequency and hesitancy. Pertinent negatives include no discharge, nausea, urgency or vomiting. She has tried nothing for the symptoms. The treatment provided no relief.     Allergies  Allergen Reactions  . Aspirin Nausea Only  . Hydrochlorothiazide     Rash  . Ibuprofen Other (See Comments)    Pt says her doctor doesn't want her taking this with the blood thinner she's taking  . Lisinopril     Other reaction(s): Unknown  . Sulfa Antibiotics Hives and Nausea Only     Current Outpatient Medications:  .  acetaminophen (TYLENOL) 500 MG tablet, Take 2 tablets (1,000 mg total) by mouth every 6 (six) hours as needed., Disp: 90 tablet, Rfl: 1 .  albuterol (VENTOLIN HFA) 108 (90 Base) MCG/ACT inhaler, INHALE 2 PUFFS INTO THE LUNGS EVERY 6 HOURS AS NEEDED FOR WHEEZING OR SHORTNESS OF BREATH, Disp: 25.5 g, Rfl: 1 .  amLODipine (NORVASC) 10 MG tablet, TAKE 1 TABLET BY MOUTH EVERY DAY, Disp: 90 tablet, Rfl: 2 .  atorvastatin (LIPITOR) 40 MG tablet, TAKE 1 TABLET BY MOUTH EVERY DAY, Disp: 90 tablet, Rfl: 1 .  azelastine (OPTIVAR) 0.05 % ophthalmic solution, Place 1 drop into both eyes 2 (two) times daily., Disp: 6 mL, Rfl: 12 .  Cholecalciferol (VITAMIN D) 2000 UNITS tablet, Take 2,000 Units by mouth daily., Disp: , Rfl:  .  clopidogrel (PLAVIX) 75 MG tablet, TAKE 1 TABLET BY MOUTH EVERY DAY, Disp: 90 tablet, Rfl: 1 .  cyclobenzaprine (FLEXERIL) 5 MG tablet, Take 1  tablet (5 mg total) by mouth 3 (three) times daily as needed for muscle spasms., Disp: 15 tablet, Rfl: 0 .  DEXILANT 60 MG capsule, TAKE 1 CAPSULE BY MOUTH EVERY DAY, Disp: 90 capsule, Rfl: 2 .  ferrous sulfate 325 (65 FE) MG tablet, Take 1 tablet (325 mg total) by mouth daily with breakfast., Disp: 90 tablet, Rfl: 1 .  fluticasone (FLONASE) 50 MCG/ACT nasal spray, USE 2 SPRAYS IN EACH NOSTRIL ONCE DAILY, Disp: 16 g, Rfl: 5 .  ketoconazole (NIZORAL) 2 % shampoo, Apply 1 application topically 2 (two) times a week., Disp: 120 mL, Rfl: 0 .  loratadine (CLARITIN) 10 MG tablet, Take 1 tablet (10 mg total) by mouth daily., Disp: 30 tablet, Rfl: 3 .  methylPREDNISolone (MEDROL) 4 MG TBPK tablet, 6 day taper; take as directed on package instructions, Disp: 21 tablet, Rfl: 0 .  Omega-3 Fatty Acids (FISH OIL) 1200 MG CAPS, Take 1 capsule by mouth daily., Disp: , Rfl:  .  Potassium 75 MG TABS, Take by mouth., Disp: , Rfl:  .  sucralfate (CARAFATE) 1 g tablet, TAKE 1 TABLET BY MOUTH FOUR TIMES DAILY AT BEDTIME WITH MEALS, Disp: 360 tablet, Rfl: 0  Review of Systems  Gastrointestinal: Negative for nausea and vomiting.  Genitourinary: Positive for dysuria, flank pain, frequency and hesitancy. Negative for urgency.  Social History   Tobacco Use  . Smoking status: Never Smoker  . Smokeless tobacco: Never Used  Substance Use Topics  . Alcohol use: No      Objective:   BP (!) 152/74 (BP Location: Left Arm, Patient Position: Sitting, Cuff Size: Normal)   Pulse (!) 58   Temp (!) 96.9 F (36.1 C) (Temporal)   Wt 178 lb (80.7 kg)   BMI 29.62 kg/m  Vitals:   08/20/19 1418  BP: (!) 152/74  Pulse: (!) 58  Temp: (!) 96.9 F (36.1 C)  TempSrc: Temporal  Weight: 178 lb (80.7 kg)  Body mass index is 29.62 kg/m.   Physical Exam Constitutional:      General: She is not in acute distress.    Appearance: She is well-developed. She is not diaphoretic.  Cardiovascular:     Rate and Rhythm: Normal  rate and regular rhythm.  Pulmonary:     Effort: Pulmonary effort is normal.     Breath sounds: Normal breath sounds.  Abdominal:     General: Bowel sounds are normal. There is no distension.     Palpations: Abdomen is soft.     Tenderness: There is abdominal tenderness in the suprapubic area. There is no guarding or rebound.  Skin:    General: Skin is warm and dry.  Neurological:     Mental Status: She is alert and oriented to person, place, and time.  Psychiatric:        Behavior: Behavior normal.      Results for orders placed or performed in visit on 08/20/19  POCT urinalysis dipstick  Result Value Ref Range   Color, UA Yellow    Clarity, UA Clear    Glucose, UA Negative Negative   Bilirubin, UA Negative    Ketones, UA Negative    Spec Grav, UA 1.015 1.010 - 1.025   Blood, UA Moderate    pH, UA 7.5 5.0 - 8.0   Protein, UA Positive (A) Negative   Urobilinogen, UA 0.2 0.2 or 1.0 E.U./dL   Nitrite, UA Negative    Leukocytes, UA Trace (A) Negative   Appearance     Odor         Assessment & Plan    1. Possible urinary tract infection  - POCT urinalysis dipstick - cephALEXin (KEFLEX) 500 MG capsule; Take 1 capsule (500 mg total) by mouth 2 (two) times daily for 5 days.  Dispense: 10 capsule; Refill: 0  2. Dysuria  - cephALEXin (KEFLEX) 500 MG capsule; Take 1 capsule (500 mg total) by mouth 2 (two) times daily for 5 days.  Dispense: 10 capsule; Refill: 0 - CULTURE, URINE COMPREHENSIVE      Trinna Post, PA-C  [facility]

## 2019-08-22 LAB — CULTURE, URINE COMPREHENSIVE

## 2019-09-27 ENCOUNTER — Other Ambulatory Visit: Payer: Self-pay | Admitting: Physician Assistant

## 2019-09-27 DIAGNOSIS — K219 Gastro-esophageal reflux disease without esophagitis: Secondary | ICD-10-CM

## 2019-10-24 NOTE — Progress Notes (Signed)
Patient: [name] Female    DOB: November 06, 1955   64 y.o.   MRN: [id number] Visit Date: 10/25/2019  Today's Provider: Mar Daring, PA-C   No chief complaint on file.  Subjective:     HPI   Hypertension, follow-up:  BP Readings from Last 3 Encounters:  10/25/19 121/76  08/20/19 (!) 152/74  05/16/19 132/75    She reports excellent compliance with treatment. She is not having side effects.  She is exercising. She is adherent to low salt diet.   Outside blood pressures are n/a. She is experiencing none.  Patient denies chest pain, no edema,fatigue,lightheadedness,blurry vision,or chest pressure. Cardiovascular risk factors include dyslipidemia and hypertension.     Weight trend: stable Wt Readings from Last 3 Encounters:  10/25/19 177 lb 3.2 oz (80.4 kg)  08/20/19 178 lb (80.7 kg)  05/16/19 183 lb 9.6 oz (83.3 kg)    Current diet: well balanced  ------------------------------------------------------------------------   Allergies  Allergen Reactions  . Aspirin Nausea Only  . Hydrochlorothiazide     Rash  . Ibuprofen Other (See Comments)    Pt says her doctor doesn't want her taking this with the blood thinner she's taking  . Lisinopril     Other reaction(s): Unknown  . Sulfa Antibiotics Hives and Nausea Only     Current Outpatient Medications:  .  acetaminophen (TYLENOL) 500 MG tablet, Take 2 tablets (1,000 mg total) by mouth every 6 (six) hours as needed., Disp: 90 tablet, Rfl: 1 .  albuterol (VENTOLIN HFA) 108 (90 Base) MCG/ACT inhaler, INHALE 2 PUFFS INTO THE LUNGS EVERY 6 HOURS AS NEEDED FOR WHEEZING OR SHORTNESS OF BREATH, Disp: 25.5 g, Rfl: 1 .  amLODipine (NORVASC) 10 MG tablet, TAKE 1 TABLET BY MOUTH EVERY DAY, Disp: 90 tablet, Rfl: 2 .  atorvastatin (LIPITOR) 40 MG tablet, TAKE 1 TABLET BY MOUTH EVERY DAY, Disp: 90 tablet, Rfl: 1 .  azelastine (OPTIVAR) 0.05 % ophthalmic solution, Place 1 drop into both eyes 2 (two) times daily.,  Disp: 6 mL, Rfl: 12 .  Cholecalciferol (VITAMIN D) 2000 UNITS tablet, Take 2,000 Units by mouth daily., Disp: , Rfl:  .  clopidogrel (PLAVIX) 75 MG tablet, TAKE 1 TABLET BY MOUTH EVERY DAY, Disp: 90 tablet, Rfl: 1 .  DEXILANT 60 MG capsule, TAKE 1 CAPSULE BY MOUTH EVERY DAY, Disp: 90 capsule, Rfl: 1 .  ferrous sulfate 325 (65 FE) MG tablet, Take 1 tablet (325 mg total) by mouth daily with breakfast., Disp: 90 tablet, Rfl: 1 .  fluticasone (FLONASE) 50 MCG/ACT nasal spray, USE 2 SPRAYS IN EACH NOSTRIL ONCE DAILY, Disp: 16 g, Rfl: 5 .  loratadine (CLARITIN) 10 MG tablet, Take 1 tablet (10 mg total) by mouth daily., Disp: 30 tablet, Rfl: 3 .  Omega-3 Fatty Acids (FISH OIL) 1200 MG CAPS, Take 1 capsule by mouth daily., Disp: , Rfl:  .  Potassium 75 MG TABS, Take by mouth., Disp: , Rfl:  .  sucralfate (CARAFATE) 1 g tablet, TAKE 1 TABLET BY MOUTH FOUR TIMES DAILY AT BEDTIME WITH MEALS, Disp: 360 tablet, Rfl: 0 .  cyclobenzaprine (FLEXERIL) 5 MG tablet, Take 1 tablet (5 mg total) by mouth 3 (three) times daily as needed for muscle spasms. (Patient not taking: Reported on 10/25/2019), Disp: 15 tablet, Rfl: 0 .  ketoconazole (NIZORAL) 2 % shampoo, Apply 1 application topically 2 (two) times a week., Disp: 120 mL, Rfl: 0 .  methylPREDNISolone (MEDROL) 4 MG TBPK tablet, 6 day  taper; take as directed on package instructions, Disp: 21 tablet, Rfl: 0  Review of Systems  Constitutional: Negative.   Eyes: Negative for visual disturbance.  Respiratory: Negative.   Cardiovascular: Negative.   Musculoskeletal: Positive for arthralgias, neck pain and neck stiffness.  Neurological: Negative for weakness, numbness and headaches.    Social History   Tobacco Use  . Smoking status: Never Smoker  . Smokeless tobacco: Never Used  Substance Use Topics  . Alcohol use: No      Objective:   BP 121/76 (BP Location: Left Arm, Patient Position: Sitting, Cuff Size: Large)   Pulse 78   Temp (!) 96.9 F (36.1 C)  (Temporal)   Resp 16   Wt 177 lb 3.2 oz (80.4 kg)   BMI 29.49 kg/m  Vitals:   10/25/19 0935  BP: 121/76  Pulse: 78  Resp: 16  Temp: (!) 96.9 F (36.1 C)  TempSrc: Temporal  Weight: 177 lb 3.2 oz (80.4 kg)  Body mass index is 29.49 kg/m.   Physical Exam Vitals reviewed.  Constitutional:      General: She is not in acute distress.    Appearance: Normal appearance. She is well-developed. She is not ill-appearing or diaphoretic.  Neck:     Thyroid: No thyromegaly.     Vascular: No JVD.     Trachea: No tracheal deviation.  Cardiovascular:     Rate and Rhythm: Normal rate and regular rhythm.     Pulses: Normal pulses.     Heart sounds: Normal heart sounds. No murmur. No friction rub. No gallop.   Pulmonary:     Effort: Pulmonary effort is normal. No respiratory distress.     Breath sounds: Normal breath sounds. No wheezing or rales.  Musculoskeletal:     Cervical back: Neck supple. Tenderness present. No torticollis. Muscular tenderness present. No pain with movement or spinous process tenderness. Decreased range of motion.  Neurological:     Mental Status: She is alert.  Psychiatric:        Mood and Affect: Mood normal.        Behavior: Behavior normal.        Thought Content: Thought content normal.        Judgment: Judgment normal.      No results found for any visits on 10/25/19.     Assessment & Plan    1. Essential hypertension Normal and stable. Continue Amlodipine 10mg . Return in Aug 2021 for CPE.  2. Cervical disc disease Has had previous surgery with hardware present. Last xray was 2018 and showed good fusion of C4-C7. Having increased muscle tension specifically after sleep. Advised to make sure she has a good supportive pillow. Will also use cyclobenzaprine 5mg  qhs prn.      Mar Daring, PA-C  [facility]

## 2019-10-25 ENCOUNTER — Other Ambulatory Visit: Payer: Self-pay

## 2019-10-25 ENCOUNTER — Encounter: Payer: Self-pay | Admitting: Physician Assistant

## 2019-10-25 ENCOUNTER — Ambulatory Visit (INDEPENDENT_AMBULATORY_CARE_PROVIDER_SITE_OTHER): Payer: Medicare Other | Admitting: Physician Assistant

## 2019-10-25 VITALS — BP 121/76 | HR 78 | Temp 96.9°F | Resp 16 | Wt 177.2 lb

## 2019-10-25 DIAGNOSIS — M5090 Cervical disc disorder, unspecified, unspecified cervical region: Secondary | ICD-10-CM

## 2019-10-25 DIAGNOSIS — I10 Essential (primary) hypertension: Secondary | ICD-10-CM | POA: Diagnosis not present

## 2019-10-25 MED ORDER — CYCLOBENZAPRINE HCL 5 MG PO TABS: 5.0000 mg | ORAL_TABLET | Freq: Every day | ORAL | 0 refills | Status: DC

## 2019-10-25 NOTE — Patient Instructions (Signed)
DASH Eating Plan DASH stands for "Dietary Approaches to Stop Hypertension." The DASH eating plan is a healthy eating plan that has been shown to reduce high blood pressure (hypertension). It may also reduce your risk for type 2 diabetes, heart disease, and stroke. The DASH eating plan may also help with weight loss. What are tips for following this plan?  General guidelines  Avoid eating more than 2,300 mg (milligrams) of salt (sodium) a day. If you have hypertension, you may need to reduce your sodium intake to 1,500 mg a day.  Limit alcohol intake to no more than 1 drink a day for nonpregnant women and 2 drinks a day for men. One drink equals 12 oz of beer, 5 oz of wine, or 1 oz of hard liquor.  Work with your health care provider to maintain a healthy body weight or to lose weight. Ask what an ideal weight is for you.  Get at least 30 minutes of exercise that causes your heart to beat faster (aerobic exercise) most days of the week. Activities may include walking, swimming, or biking.  Work with your health care provider or diet and nutrition specialist (dietitian) to adjust your eating plan to your individual calorie needs. Reading food labels   Check food labels for the amount of sodium per serving. Choose foods with less than 5 percent of the Daily Value of sodium. Generally, foods with less than 300 mg of sodium per serving fit into this eating plan.  To find whole grains, look for the word "whole" as the first word in the ingredient list. Shopping  Buy products labeled as "low-sodium" or "no salt added."  Buy fresh foods. Avoid canned foods and premade or frozen meals. Cooking  Avoid adding salt when cooking. Use salt-free seasonings or herbs instead of table salt or sea salt. Check with your health care provider or pharmacist before using salt substitutes.  Do not fry foods. Cook foods using healthy methods such as baking, boiling, grilling, and broiling instead.  Cook with  heart-healthy oils, such as olive, canola, soybean, or sunflower oil. Meal planning  Eat a balanced diet that includes: ? 5 or more servings of fruits and vegetables each day. At each meal, try to fill half of your plate with fruits and vegetables. ? Up to 6-8 servings of whole grains each day. ? Less than 6 oz of lean meat, poultry, or fish each day. A 3-oz serving of meat is about the same size as a deck of cards. One egg equals 1 oz. ? 2 servings of low-fat dairy each day. ? A serving of nuts, seeds, or beans 5 times each week. ? Heart-healthy fats. Healthy fats called Omega-3 fatty acids are found in foods such as flaxseeds and coldwater fish, like sardines, salmon, and mackerel.  Limit how much you eat of the following: ? Canned or prepackaged foods. ? Food that is high in trans fat, such as fried foods. ? Food that is high in saturated fat, such as fatty meat. ? Sweets, desserts, sugary drinks, and other foods with added sugar. ? Full-fat dairy products.  Do not salt foods before eating.  Try to eat at least 2 vegetarian meals each week.  Eat more home-cooked food and less restaurant, buffet, and fast food.  When eating at a restaurant, ask that your food be prepared with less salt or no salt, if possible. What foods are recommended? The items listed may not be a complete list. Talk with your dietitian about   what dietary choices are best for you. Grains Whole-grain or whole-wheat bread. Whole-grain or whole-wheat pasta. Brown rice. Oatmeal. Quinoa. Bulgur. Whole-grain and low-sodium cereals. Pita bread. Low-fat, low-sodium crackers. Whole-wheat flour tortillas. Vegetables Fresh or frozen vegetables (raw, steamed, roasted, or grilled). Low-sodium or reduced-sodium tomato and vegetable juice. Low-sodium or reduced-sodium tomato sauce and tomato paste. Low-sodium or reduced-sodium canned vegetables. Fruits All fresh, dried, or frozen fruit. Canned fruit in natural juice (without  added sugar). Meat and other protein foods Skinless chicken or turkey. Ground chicken or turkey. Pork with fat trimmed off. Fish and seafood. Egg whites. Dried beans, peas, or lentils. Unsalted nuts, nut butters, and seeds. Unsalted canned beans. Lean cuts of beef with fat trimmed off. Low-sodium, lean deli meat. Dairy Low-fat (1%) or fat-free (skim) milk. Fat-free, low-fat, or reduced-fat cheeses. Nonfat, low-sodium ricotta or cottage cheese. Low-fat or nonfat yogurt. Low-fat, low-sodium cheese. Fats and oils Soft margarine without trans fats. Vegetable oil. Low-fat, reduced-fat, or light mayonnaise and salad dressings (reduced-sodium). Canola, safflower, olive, soybean, and sunflower oils. Avocado. Seasoning and other foods Herbs. Spices. Seasoning mixes without salt. Unsalted popcorn and pretzels. Fat-free sweets. What foods are not recommended? The items listed may not be a complete list. Talk with your dietitian about what dietary choices are best for you. Grains Baked goods made with fat, such as croissants, muffins, or some breads. Dry pasta or rice meal packs. Vegetables Creamed or fried vegetables. Vegetables in a cheese sauce. Regular canned vegetables (not low-sodium or reduced-sodium). Regular canned tomato sauce and paste (not low-sodium or reduced-sodium). Regular tomato and vegetable juice (not low-sodium or reduced-sodium). Pickles. Olives. Fruits Canned fruit in a light or heavy syrup. Fried fruit. Fruit in cream or butter sauce. Meat and other protein foods Fatty cuts of meat. Ribs. Fried meat. Bacon. Sausage. Bologna and other processed lunch meats. Salami. Fatback. Hotdogs. Bratwurst. Salted nuts and seeds. Canned beans with added salt. Canned or smoked fish. Whole eggs or egg yolks. Chicken or turkey with skin. Dairy Whole or 2% milk, cream, and half-and-half. Whole or full-fat cream cheese. Whole-fat or sweetened yogurt. Full-fat cheese. Nondairy creamers. Whipped toppings.  Processed cheese and cheese spreads. Fats and oils Butter. Stick margarine. Lard. Shortening. Ghee. Bacon fat. Tropical oils, such as coconut, palm kernel, or palm oil. Seasoning and other foods Salted popcorn and pretzels. Onion salt, garlic salt, seasoned salt, table salt, and sea salt. Worcestershire sauce. Tartar sauce. Barbecue sauce. Teriyaki sauce. Soy sauce, including reduced-sodium. Steak sauce. Canned and packaged gravies. Fish sauce. Oyster sauce. Cocktail sauce. Horseradish that you find on the shelf. Ketchup. Mustard. Meat flavorings and tenderizers. Bouillon cubes. Hot sauce and Tabasco sauce. Premade or packaged marinades. Premade or packaged taco seasonings. Relishes. Regular salad dressings. Where to find more information:  National Heart, Lung, and Blood Institute: www.nhlbi.nih.gov  American Heart Association: www.heart.org Summary  The DASH eating plan is a healthy eating plan that has been shown to reduce high blood pressure (hypertension). It may also reduce your risk for type 2 diabetes, heart disease, and stroke.  With the DASH eating plan, you should limit salt (sodium) intake to 2,300 mg a day. If you have hypertension, you may need to reduce your sodium intake to 1,500 mg a day.  When on the DASH eating plan, aim to eat more fresh fruits and vegetables, whole grains, lean proteins, low-fat dairy, and heart-healthy fats.  Work with your health care provider or diet and nutrition specialist (dietitian) to adjust your eating plan to your   individual calorie needs. This information is not intended to replace advice given to you by your health care provider. Make sure you discuss any questions you have with your health care provider. Document Revised: 06/23/2017 Document Reviewed: 07/04/2016 Elsevier Patient Education  2020 Elsevier Inc.  

## 2019-12-13 ENCOUNTER — Telehealth: Payer: Self-pay

## 2019-12-13 DIAGNOSIS — Z1211 Encounter for screening for malignant neoplasm of colon: Secondary | ICD-10-CM

## 2019-12-13 NOTE — Telephone Encounter (Signed)
Copied from [place] [id number]. Topic: General - Other >> Dec 13, 2019  9:31 AM Leward Quan A wrote: Reason for CRM: Patient called to inform Fenton Malling that her colonoscopy is overdue. States that the last one she had was in April of 2016 she is asking Anderson Malta what is the next step and if she can get set up please. Can be reached at Ph# [phone]

## 2019-12-16 NOTE — Telephone Encounter (Signed)
Referral placed to Dr. Allen Norris

## 2019-12-16 NOTE — Telephone Encounter (Signed)
Patient advised.

## 2019-12-20 ENCOUNTER — Telehealth (INDEPENDENT_AMBULATORY_CARE_PROVIDER_SITE_OTHER): Payer: Self-pay | Admitting: Gastroenterology

## 2019-12-20 DIAGNOSIS — Z86010 Personal history of colonic polyps: Secondary | ICD-10-CM

## 2019-12-20 NOTE — Progress Notes (Signed)
Gastroenterology Pre-Procedure Review  Request Date: Tuesday 01/07/20 Requesting Physician: Dr. Allen Norris  PATIENT REVIEW QUESTIONS: The patient responded to the following health history questions as indicated:    1. Are you having any GI issues? no 2. Do you have a personal history of Polyps? yes (2016 colonoscopy was performed with Dr. Allen Norris.  Noted in [place]) 3. Do you have a family history of Colon Cancer or Polyps? no 4. Diabetes Mellitus? no 5. Joint replacements in the past 12 months?no 6. Major health problems in the past 3 months?no 7. Any artificial heart valves, MVP, or defibrillator?no    MEDICATIONS & ALLERGIES:    Patient reports the following regarding taking any anticoagulation/antiplatelet therapy:   Plavix, Coumadin, Eliquis, Xarelto, Lovenox, Pradaxa, Brilinta, or Effient? yes (Plavix blood thinner request sent to PA-C Fenton Malling) Aspirin? no  Patient confirms/reports the following medications:  Current Outpatient Medications  Medication Sig Dispense Refill  . acetaminophen (TYLENOL) 500 MG tablet Take 2 tablets (1,000 mg total) by mouth every 6 (six) hours as needed. 90 tablet 1  . albuterol (VENTOLIN HFA) 108 (90 Base) MCG/ACT inhaler INHALE 2 PUFFS INTO THE LUNGS EVERY 6 HOURS AS NEEDED FOR WHEEZING OR SHORTNESS OF BREATH 25.5 g 1  . amLODipine (NORVASC) 10 MG tablet TAKE 1 TABLET BY MOUTH EVERY DAY 90 tablet 2  . atorvastatin (LIPITOR) 40 MG tablet TAKE 1 TABLET BY MOUTH EVERY DAY 90 tablet 1  . azelastine (OPTIVAR) 0.05 % ophthalmic solution Place 1 drop into both eyes 2 (two) times daily. 6 mL 12  . Cholecalciferol (VITAMIN D) 2000 UNITS tablet Take 2,000 Units by mouth daily.    . clopidogrel (PLAVIX) 75 MG tablet TAKE 1 TABLET BY MOUTH EVERY DAY 90 tablet 1  . cyclobenzaprine (FLEXERIL) 5 MG tablet Take 1 tablet (5 mg total) by mouth at bedtime. 90 tablet 0  . DEXILANT 60 MG capsule TAKE 1 CAPSULE BY MOUTH EVERY DAY 90 capsule 1  . ferrous sulfate 325 (65 FE)  MG tablet Take 1 tablet (325 mg total) by mouth daily with breakfast. 90 tablet 1  . fluticasone (FLONASE) 50 MCG/ACT nasal spray USE 2 SPRAYS IN EACH NOSTRIL ONCE DAILY 16 g 5  . loratadine (CLARITIN) 10 MG tablet Take 1 tablet (10 mg total) by mouth daily. 30 tablet 3  . Omega-3 Fatty Acids (FISH OIL) 1200 MG CAPS Take 1 capsule by mouth daily.    . Potassium 75 MG TABS Take by mouth.    . potassium chloride (KLOR-CON) 10 MEQ tablet potassium chloride ER 10 mEq tablet,extended release    . sucralfate (CARAFATE) 1 g tablet TAKE 1 TABLET BY MOUTH FOUR TIMES DAILY AT BEDTIME WITH MEALS 360 tablet 0   No current facility-administered medications for this visit.    Patient confirms/reports the following allergies:  Allergies  Allergen Reactions  . Aspirin Nausea Only  . Hydrochlorothiazide     Rash  . Ibuprofen Other (See Comments)    Pt says her doctor doesn't want her taking this with the blood thinner she's taking  . Lisinopril     Other reaction(s): Unknown  . Sulfa Antibiotics Hives and Nausea Only    No orders of the defined types were placed in this encounter.   AUTHORIZATION INFORMATION Primary Insurance: 1D#: Group #:  Secondary Insurance: 1D#: Group #:  SCHEDULE INFORMATION: Date: 01/07/20 Time: Location:[facility]

## 2020-01-02 ENCOUNTER — Other Ambulatory Visit: Payer: Self-pay | Admitting: Physician Assistant

## 2020-01-02 ENCOUNTER — Other Ambulatory Visit
Admission: RE | Admit: 2020-01-02 | Discharge: 2020-01-02 | Disposition: A | Discharge status: Home / Self Care | Payer: Medicare Other | Source: Ambulatory Visit | Attending: Gastroenterology | Admitting: Gastroenterology

## 2020-01-02 ENCOUNTER — Other Ambulatory Visit: Payer: Self-pay

## 2020-01-02 DIAGNOSIS — Z01812 Encounter for preprocedural laboratory examination: Secondary | ICD-10-CM | POA: Insufficient documentation

## 2020-01-02 DIAGNOSIS — Z20822 Contact with and (suspected) exposure to covid-19: Secondary | ICD-10-CM | POA: Insufficient documentation

## 2020-01-02 DIAGNOSIS — I10 Essential (primary) hypertension: Secondary | ICD-10-CM

## 2020-01-02 DIAGNOSIS — Z8673 Personal history of transient ischemic attack (TIA), and cerebral infarction without residual deficits: Secondary | ICD-10-CM

## 2020-01-02 DIAGNOSIS — E7800 Pure hypercholesterolemia, unspecified: Secondary | ICD-10-CM

## 2020-01-03 ENCOUNTER — Other Ambulatory Visit: Payer: Medicare Other

## 2020-01-03 LAB — SARS CORONAVIRUS 2 (TAT 6-24 HRS): SARS Coronavirus 2: NEGATIVE

## 2020-01-07 ENCOUNTER — Telehealth: Payer: Self-pay

## 2020-01-07 NOTE — Telephone Encounter (Signed)
Patient has been advised to stop Plavix per Anderson Malta Burnette's advice received via fax-to stop Plavix 5 days before her colonoscopy restart 1 day after.  Thanks,  [place], [place]

## 2020-01-21 ENCOUNTER — Telehealth: Payer: Self-pay

## 2020-01-21 NOTE — Telephone Encounter (Signed)
Patient is going on vacation on 01/28/2020 and can not do colonoscopy. Moved patient to 02/04/2020. Informed patient COVID test would be on 01/31/2020. She verbalized understanding. Informed Nancy in ENDO of the change. She stated she would moved them

## 2020-01-24 ENCOUNTER — Other Ambulatory Visit: Payer: Medicare Other

## 2020-01-31 ENCOUNTER — Other Ambulatory Visit
Admission: RE | Admit: 2020-01-31 | Discharge: 2020-01-31 | Disposition: A | Discharge status: Home / Self Care | Payer: Medicare Other | Source: Ambulatory Visit | Attending: Gastroenterology | Admitting: Gastroenterology

## 2020-01-31 ENCOUNTER — Other Ambulatory Visit: Payer: Self-pay

## 2020-01-31 DIAGNOSIS — Z01812 Encounter for preprocedural laboratory examination: Secondary | ICD-10-CM | POA: Insufficient documentation

## 2020-01-31 DIAGNOSIS — Z20822 Contact with and (suspected) exposure to covid-19: Secondary | ICD-10-CM | POA: Diagnosis not present

## 2020-02-01 LAB — SARS CORONAVIRUS 2 (TAT 6-24 HRS): SARS Coronavirus 2: NEGATIVE

## 2020-02-03 ENCOUNTER — Encounter: Payer: Self-pay | Admitting: Gastroenterology

## 2020-02-04 ENCOUNTER — Other Ambulatory Visit: Payer: Self-pay

## 2020-02-04 ENCOUNTER — Ambulatory Visit: Payer: Medicare Other | Admitting: Certified Registered"

## 2020-02-04 ENCOUNTER — Ambulatory Visit
Admission: RE | Admit: 2020-02-04 | Discharge: 2020-02-04 | Disposition: A | Discharge status: Home / Self Care | Payer: Medicare Other | Source: Ambulatory Visit | Attending: Gastroenterology | Admitting: Gastroenterology

## 2020-02-04 ENCOUNTER — Encounter
Admission: RE | Disposition: A | Discharge status: Home / Self Care | Payer: Self-pay | Source: Ambulatory Visit | Attending: Gastroenterology

## 2020-02-04 DIAGNOSIS — M1990 Unspecified osteoarthritis, unspecified site: Secondary | ICD-10-CM | POA: Insufficient documentation

## 2020-02-04 DIAGNOSIS — Z7902 Long term (current) use of antithrombotics/antiplatelets: Secondary | ICD-10-CM | POA: Diagnosis not present

## 2020-02-04 DIAGNOSIS — K219 Gastro-esophageal reflux disease without esophagitis: Secondary | ICD-10-CM | POA: Insufficient documentation

## 2020-02-04 DIAGNOSIS — Z1211 Encounter for screening for malignant neoplasm of colon: Secondary | ICD-10-CM | POA: Insufficient documentation

## 2020-02-04 DIAGNOSIS — Z886 Allergy status to analgesic agent status: Secondary | ICD-10-CM | POA: Diagnosis not present

## 2020-02-04 DIAGNOSIS — K635 Polyp of colon: Secondary | ICD-10-CM | POA: Diagnosis not present

## 2020-02-04 DIAGNOSIS — D649 Anemia, unspecified: Secondary | ICD-10-CM | POA: Insufficient documentation

## 2020-02-04 DIAGNOSIS — Z888 Allergy status to other drugs, medicaments and biological substances status: Secondary | ICD-10-CM | POA: Insufficient documentation

## 2020-02-04 DIAGNOSIS — Z86010 Personal history of colonic polyps: Secondary | ICD-10-CM | POA: Diagnosis not present

## 2020-02-04 DIAGNOSIS — Z882 Allergy status to sulfonamides status: Secondary | ICD-10-CM | POA: Diagnosis not present

## 2020-02-04 DIAGNOSIS — D124 Benign neoplasm of descending colon: Secondary | ICD-10-CM | POA: Diagnosis not present

## 2020-02-04 DIAGNOSIS — Z8249 Family history of ischemic heart disease and other diseases of the circulatory system: Secondary | ICD-10-CM | POA: Insufficient documentation

## 2020-02-04 DIAGNOSIS — E785 Hyperlipidemia, unspecified: Secondary | ICD-10-CM | POA: Insufficient documentation

## 2020-02-04 DIAGNOSIS — I10 Essential (primary) hypertension: Secondary | ICD-10-CM | POA: Diagnosis not present

## 2020-02-04 DIAGNOSIS — K648 Other hemorrhoids: Secondary | ICD-10-CM | POA: Insufficient documentation

## 2020-02-04 DIAGNOSIS — Z79899 Other long term (current) drug therapy: Secondary | ICD-10-CM | POA: Diagnosis not present

## 2020-02-04 HISTORY — PX: COLONOSCOPY WITH PROPOFOL: SHX5780

## 2020-02-04 SURGERY — COLONOSCOPY WITH PROPOFOL
Anesthesia: General

## 2020-02-04 MED ORDER — PROPOFOL 500 MG/50ML IV EMUL
INTRAVENOUS | Status: AC
  Filled 2020-02-04: qty 50

## 2020-02-04 MED ORDER — PROPOFOL 500 MG/50ML IV EMUL
INTRAVENOUS | Status: DC | PRN
  Administered 2020-02-04: 120 ug/kg/min via INTRAVENOUS

## 2020-02-04 MED ORDER — SODIUM CHLORIDE 0.9 % IV SOLN: INTRAVENOUS | Status: DC

## 2020-02-04 NOTE — Anesthesia Preprocedure Evaluation (Signed)
Anesthesia Evaluation  Patient identified by MRN, date of birth, ID band Patient awake    Reviewed: Allergy & Precautions, H&P , NPO status , Patient's Chart, lab work & pertinent test results, reviewed documented beta blocker date and time   History of Anesthesia Complications Negative for: history of anesthetic complications  Airway Mallampati: II  TM Distance: >3 FB Neck ROM: full    Dental  (+) Teeth Intact, Poor Dentition   Pulmonary neg pulmonary ROS, neg sleep apnea, neg COPD, Patient abstained from smoking.Not current smoker,    Pulmonary exam normal breath sounds clear to auscultation       Cardiovascular Exercise Tolerance: Good METShypertension, (-) CAD and (-) Past MI negative cardio ROS  + dysrhythmias  Rhythm:regular Rate:Normal     Neuro/Psych  Headaches, negative psych ROS   GI/Hepatic Neg liver ROS, GERD  Medicated and Controlled,  Endo/Other  negative endocrine ROSneg diabetes  Renal/GU negative Renal ROS  negative genitourinary   Musculoskeletal  (+) Arthritis , S/p cervical spine fusion   Abdominal   Peds  Hematology negative hematology ROS (+) anemia ,   Anesthesia Other Findings Past Medical History: No date: Allergy No date: Anemia     Comment:  takes Ferrous Sulfate daily No date: Arthritis No date: GERD (gastroesophageal reflux disease)     Comment:  takes OTC med No date: Hyperlipidemia No date: Hypertension     Comment:  takes Amlodipine daily No date: Low back pain     Comment:  occasionally  Reproductive/Obstetrics negative OB ROS                             Anesthesia Physical  Anesthesia Plan  ASA: II  Anesthesia Plan: General   Post-op Pain Management:    Induction: Intravenous  PONV Risk Score and Plan: 3 and Ondansetron, Propofol infusion and TIVA  Airway Management Planned: Nasal Cannula and Natural Airway  Additional Equipment:  None  Intra-op Plan:   Post-operative Plan:   Informed Consent: I have reviewed the patients History and Physical, chart, labs and discussed the procedure including the risks, benefits and alternatives for the proposed anesthesia with the patient or authorized representative who has indicated his/her understanding and acceptance.     Dental Advisory Given  Plan Discussed with: CRNA  Anesthesia Plan Comments: (Discussed risks of anesthesia with patient, including possibility of difficulty with spontaneous ventilation under anesthesia necessitating airway intervention, PONV, and rare risks such as cardiac or respiratory or neurological events. Patient understands.)        Anesthesia Quick Evaluation

## 2020-02-04 NOTE — Anesthesia Postprocedure Evaluation (Signed)
Anesthesia Post Note  Patient: [name]  Procedure(s) Performed: COLONOSCOPY WITH PROPOFOL (N/A )  Patient location during evaluation: Endoscopy Anesthesia Type: General Level of consciousness: awake and alert Pain management: pain level controlled Vital Signs Assessment: post-procedure vital signs reviewed and stable Respiratory status: spontaneous breathing, nonlabored ventilation, respiratory function stable and patient connected to nasal cannula oxygen Cardiovascular status: blood pressure returned to baseline and stable Postop Assessment: no apparent nausea or vomiting Anesthetic complications: no   No complications documented.   Last Vitals:  Vitals:   02/04/20 1003 02/04/20 1046  BP: (!) 153/80 124/65  Pulse: 64   Resp: 16   Temp: (!) 35.9 C (!) 35.9 C  SpO2: 97% 98%    Last Pain:  Vitals:   02/04/20 1116  TempSrc:   PainSc: 0-No pain                 Arita Miss

## 2020-02-04 NOTE — Op Note (Signed)
[facility] Gastroenterology Patient Name: [name] Procedure Date: 02/04/2020 10:29 AM MRN: [id number] Account #: [id number] Date of Birth: [birth year] Admit Type: Outpatient Age: 64 Room: [facility] ENDO ROOM 4 Gender: Female Note Status: Finalized Procedure:             Colonoscopy Indications:           High risk colon cancer surveillance: Personal history                         of colonic polyps Providers:             [name] MD, MD Medicines:             Propofol per Anesthesia Complications:         No immediate complications. Procedure:             Pre-Anesthesia Assessment:                        - Prior to the procedure, a History and Physical was                         performed, and patient medications and allergies were                         reviewed. The patient's tolerance of previous                         anesthesia was also reviewed. The risks and benefits                         of the procedure and the sedation options and risks                         were discussed with the patient. All questions were                         answered, and informed consent was obtained. Prior                         Anticoagulants: The patient has taken no previous                         anticoagulant or antiplatelet agents. ASA Grade                         Assessment: II - A patient with mild systemic disease.                         After reviewing the risks and benefits, the patient                         was deemed in satisfactory condition to undergo the                         procedure.                        After obtaining informed consent, the colonoscope was  passed under direct vision. Throughout the procedure,                         the patient's blood pressure, pulse, and oxygen                         saturations were monitored continuously. The                         Colonoscope was introduced through the anus  and                         advanced to the the cecum, identified by appendiceal                         orifice and ileocecal valve. The colonoscopy was                         performed without difficulty. The patient tolerated                         the procedure well. The quality of the bowel                         preparation was excellent. Findings:      The perianal and digital rectal examinations were normal.      A 4 mm polyp was found in the descending colon. The polyp was sessile.       The polyp was removed with a cold snare. Resection and retrieval were       complete.      Non-bleeding internal hemorrhoids were found during retroflexion. The       hemorrhoids were Grade I (internal hemorrhoids that do not prolapse). Impression:            - One 4 mm polyp in the descending colon, removed with                         a cold snare. Resected and retrieved.                        - Non-bleeding internal hemorrhoids. Recommendation:        - Discharge patient to home.                        - Resume previous diet.                        - Continue present medications.                        - Await pathology results.                        - Repeat colonoscopy in 7 years for surveillance. Procedure Code(s):     --- Professional ---                        [phone], Colonoscopy, flexible; with removal of  tumor(s), polyp(s), or other lesion(s) by snare                         technique Diagnosis Code(s):     --- Professional ---                        Z86.010, Personal history of colonic polyps                        K63.5, Polyp of colon CPT copyright 2019 American Medical Association. All rights reserved. The codes documented in this report are preliminary and upon coder review may  be revised to meet current compliance requirements. [name] MD, MD 02/04/2020 10:42:55 AM This report has been signed electronically. Number of Addenda: 0 Note  Initiated On: 02/04/2020 10:29 AM Scope Withdrawal Time: 0 hours 5 minutes 25 seconds  Total Procedure Duration: 0 hours 7 minutes 56 seconds  Estimated Blood Loss:  Estimated blood loss: none.      [facility]

## 2020-02-04 NOTE — Transfer of Care (Signed)
Immediate Anesthesia Transfer of Care Note  Patient: [name]  Procedure(s) Performed: COLONOSCOPY WITH PROPOFOL (N/A )  Patient Location: PACU and Endoscopy Unit  Anesthesia Type:General  Level of Consciousness: sedated  Airway & Oxygen Therapy: Patient Spontanous Breathing  Post-op Assessment: Report given to RN  Post vital signs: stable  Last Vitals:  Vitals Value Taken Time  BP    Temp    Pulse    Resp    SpO2      Last Pain:  Vitals:   02/04/20 1003  TempSrc: Temporal  PainSc: 3          Complications: No complications documented.

## 2020-02-04 NOTE — H&P (Signed)
[name], MD [facility] [address], [place], [place] [address] Phone:[phone] Fax : [phone]  Primary Care Physician:  Mar Daring, PA-C Primary Gastroenterologist:  Dr. Allen Norris  Pre-Procedure History & Physical: HPI:  [name] is a 64 y.o. female is here for an colonoscopy.   Past Medical History:  Diagnosis Date  . Allergy   . Anemia    takes Ferrous Sulfate daily  . Arthritis   . GERD (gastroesophageal reflux disease)    takes OTC med  . Hyperlipidemia   . Hypertension    takes Amlodipine daily  . Low back pain    occasionally    Past Surgical History:  Procedure Laterality Date  . ANTERIOR CERVICAL DECOMP/DISCECTOMY FUSION  10/19/2011   Procedure: ANTERIOR CERVICAL DECOMPRESSION/DISCECTOMY FUSION 1 LEVEL/HARDWARE REMOVAL;  Surgeon: Elaina Hoops, MD;  Location: [facility];  Service: Neurosurgery;  Laterality: N/A;  Cervical four - five  Anterior cervical decompression fusion with redo at six - seven  Rm 33  . CARPAL TUNNEL RELEASE    . COLONOSCOPY  08/2014   polyps  . ESOPHAGOGASTRODUODENOSCOPY (EGD) WITH PROPOFOL N/A 06/23/2015   Procedure: ESOPHAGOGASTRODUODENOSCOPY (EGD) WITH PROPOFOL;  Surgeon: [name], MD;  Location: [facility] ENDOSCOPY;  Service: Endoscopy;  Laterality: N/A;  . NECK SURGERY    . POSTERIOR CERVICAL FUSION/FORAMINOTOMY N/A 08/26/2013   Procedure: C/4-5,C/5-6,C/6-7 Posterior Cervical Fusion w/lateral mass fixation;  Surgeon: Elaina Hoops, MD;  Location: [facility];  Service: Neurosurgery;  Laterality: N/A;  . TUBAL LIGATION      Prior to Admission medications   Medication Sig Start Date End Date Taking? Authorizing Provider  acetaminophen (TYLENOL) 500 MG tablet Take 2 tablets (1,000 mg total) by mouth every 6 (six) hours as needed. 03/22/17  Yes Mar Daring, PA-C  albuterol (VENTOLIN HFA) 108 (90 Base) MCG/ACT inhaler INHALE 2 PUFFS INTO THE LUNGS EVERY 6 HOURS AS NEEDED FOR WHEEZING OR SHORTNESS OF BREATH  01/16/19  Yes Fenton Malling M, PA-C  amLODipine (NORVASC) 10 MG tablet TAKE 1 TABLET BY MOUTH EVERY DAY 01/02/20  Yes Fenton Malling M, PA-C  atorvastatin (LIPITOR) 40 MG tablet TAKE 1 TABLET BY MOUTH EVERY DAY 01/02/20  Yes Fenton Malling M, PA-C  azelastine (OPTIVAR) 0.05 % ophthalmic solution Place 1 drop into both eyes 2 (two) times daily. 01/02/19  Yes Mar Daring, PA-C  Cholecalciferol (VITAMIN D) 2000 UNITS tablet Take 2,000 Units by mouth daily.   Yes [provider]  clopidogrel (PLAVIX) 75 MG tablet TAKE 1 TABLET BY MOUTH EVERY DAY 01/02/20  Yes Burnette, Clearnce Sorrel, PA-C  cyclobenzaprine (FLEXERIL) 5 MG tablet Take 1 tablet (5 mg total) by mouth at bedtime. 10/25/19  Yes Mar Daring, PA-C  DEXILANT 60 MG capsule TAKE 1 CAPSULE BY MOUTH EVERY DAY 09/27/19  Yes Mar Daring, PA-C  ferrous sulfate 325 (65 FE) MG tablet Take 1 tablet (325 mg total) by mouth daily with breakfast. 06/25/18  Yes Burnette, Anderson Malta M, PA-C  fluticasone [facility]) 50 MCG/ACT nasal spray USE 2 SPRAYS IN EACH NOSTRIL ONCE DAILY 10/30/18  Yes Fenton Malling M, PA-C  loratadine (CLARITIN) 10 MG tablet Take 1 tablet (10 mg total) by mouth daily. 10/20/17  Yes Mar Daring, PA-C  Omega-3 Fatty Acids (FISH OIL) 1200 MG CAPS Take 1 capsule by mouth daily.   Yes [provider]  Potassium 75 MG TABS Take by mouth.   Yes [provider]  potassium chloride (KLOR-CON) 10  MEQ tablet potassium chloride ER 10 mEq tablet,extended release   Yes [provider]  sucralfate (CARAFATE) 1 g tablet TAKE 1 TABLET BY MOUTH FOUR TIMES DAILY AT BEDTIME WITH MEALS 01/02/19  Yes Mar Daring, PA-C    Allergies as of 12/20/2019 - Review Complete 12/20/2019  Allergen Reaction Noted  . Aspirin Nausea Only 10/11/2011  . Hydrochlorothiazide  01/06/2015  . Ibuprofen Other (See Comments) 09/04/2017  . Lisinopril  01/07/2015  . Sulfa antibiotics Hives and Nausea  Only 10/11/2011    Family History  Problem Relation Age of Onset  . Hypertension Mother   . Heart disease Mother   . Breast cancer Neg Hx     Social History   Socioeconomic History  . Marital status: Single    Spouse name: Not on file  . Number of children: 3  . Years of education: Not on file  . Highest education level: Some college, no degree  Occupational History  . Occupation: disable  Tobacco Use  . Smoking status: Never Smoker  . Smokeless tobacco: Never Used  Vaping Use  . Vaping Use: Never used  Substance and Sexual Activity  . Alcohol use: No  . Drug use: No  . Sexual activity: Not on file  Other Topics Concern  . Not on file  Social History Narrative  . Not on file   Social Determinants of Health   Financial Resource Strain:   . Difficulty of Paying Living Expenses:   Food Insecurity:   . Worried About Charity fundraiser in the Last Year:   . Arboriculturist in the Last Year:   Transportation Needs:   . Film/video editor (Medical):   Marland Kitchen Lack of Transportation (Non-Medical):   Physical Activity:   . Days of Exercise per Week:   . Minutes of Exercise per Session:   Stress:   . Feeling of Stress :   Social Connections:   . Frequency of Communication with Friends and Family:   . Frequency of Social Gatherings with Friends and Family:   . Attends Religious Services:   . Active Member of Clubs or Organizations:   . Attends Archivist Meetings:   Marland Kitchen Marital Status:   Intimate Partner Violence:   . Fear of Current or Ex-Partner:   . Emotionally Abused:   Marland Kitchen Physically Abused:   . Sexually Abused:     Review of Systems: See HPI, otherwise negative ROS  Physical Exam: BP (!) 153/80   Pulse 64   Temp (!) 96.6 F (35.9 C) (Temporal)   Resp 16   Ht 5\' 5"  (1.651 m)   Wt 78.5 kg   SpO2 97%   BMI 28.79 kg/m  General:   Alert,  pleasant and cooperative in NAD Head:  Normocephalic and atraumatic. Neck:  Supple; no masses or  thyromegaly. Lungs:  Clear throughout to auscultation.    Heart:  Regular rate and rhythm. Abdomen:  Soft, nontender and nondistended. Normal bowel sounds, without guarding, and without rebound.   Neurologic:  Alert and  oriented x4;  grossly normal neurologically.  Impression/Plan: [name] is here for an colonoscopy to be performed for history of adenomatous polyps with last colonoscopy in April 2016  Risks, benefits, limitations, and alternatives regarding  colonoscopy have been reviewed with the patient.  Questions have been answered.  All parties agreeable.   [name], MD  02/04/2020, 10:25 AM

## 2020-02-05 ENCOUNTER — Encounter: Payer: Self-pay | Admitting: Gastroenterology

## 2020-02-05 LAB — SURGICAL PATHOLOGY

## 2020-02-10 ENCOUNTER — Encounter: Payer: Self-pay | Admitting: Gastroenterology

## 2020-02-24 ENCOUNTER — Ambulatory Visit: Payer: Self-pay | Admitting: *Deleted

## 2020-02-24 ENCOUNTER — Other Ambulatory Visit: Payer: Self-pay

## 2020-02-24 ENCOUNTER — Emergency Department: Payer: Medicare Other

## 2020-02-24 ENCOUNTER — Encounter: Payer: Self-pay | Admitting: Emergency Medicine

## 2020-02-24 ENCOUNTER — Emergency Department
Admission: EM | Admit: 2020-02-24 | Discharge: 2020-02-24 | Disposition: A | Discharge status: Home / Self Care | Payer: Medicare Other | Attending: Emergency Medicine | Admitting: Emergency Medicine

## 2020-02-24 DIAGNOSIS — J40 Bronchitis, not specified as acute or chronic: Secondary | ICD-10-CM

## 2020-02-24 DIAGNOSIS — I10 Essential (primary) hypertension: Secondary | ICD-10-CM | POA: Diagnosis not present

## 2020-02-24 DIAGNOSIS — Z20822 Contact with and (suspected) exposure to covid-19: Secondary | ICD-10-CM | POA: Insufficient documentation

## 2020-02-24 DIAGNOSIS — Z79899 Other long term (current) drug therapy: Secondary | ICD-10-CM | POA: Insufficient documentation

## 2020-02-24 DIAGNOSIS — E785 Hyperlipidemia, unspecified: Secondary | ICD-10-CM | POA: Diagnosis not present

## 2020-02-24 DIAGNOSIS — R079 Chest pain, unspecified: Secondary | ICD-10-CM | POA: Diagnosis present

## 2020-02-24 DIAGNOSIS — Z7982 Long term (current) use of aspirin: Secondary | ICD-10-CM | POA: Diagnosis not present

## 2020-02-24 DIAGNOSIS — K219 Gastro-esophageal reflux disease without esophagitis: Secondary | ICD-10-CM | POA: Diagnosis not present

## 2020-02-24 DIAGNOSIS — J209 Acute bronchitis, unspecified: Secondary | ICD-10-CM | POA: Diagnosis not present

## 2020-02-24 LAB — CBC
HCT: 40.7 % (ref 36.0–46.0)
Hemoglobin: 14.1 g/dL (ref 12.0–15.0)
MCH: 29.1 pg (ref 26.0–34.0)
MCHC: 34.6 g/dL (ref 30.0–36.0)
MCV: 84.1 fL (ref 80.0–100.0)
Platelets: 339 10*3/uL (ref 150–400)
RBC: 4.84 MIL/uL (ref 3.87–5.11)
RDW: 12.7 % (ref 11.5–15.5)
WBC: 6.7 10*3/uL (ref 4.0–10.5)
nRBC: 0 % (ref 0.0–0.2)

## 2020-02-24 LAB — BASIC METABOLIC PANEL
Anion gap: 10 (ref 5–15)
BUN: 7 mg/dL — ABNORMAL LOW (ref 8–23)
CO2: 28 mmol/L (ref 22–32)
Calcium: 9.1 mg/dL (ref 8.9–10.3)
Chloride: 101 mmol/L (ref 98–111)
Creatinine, Ser: 0.74 mg/dL (ref 0.44–1.00)
GFR calc Af Amer: >60 mL/min (ref >60)
GFR calc non Af Amer: >60 mL/min (ref >60)
Glucose, Bld: 114 mg/dL — ABNORMAL HIGH (ref 70–99)
Potassium: 3.1 mmol/L — ABNORMAL LOW (ref 3.5–5.1)
Sodium: 139 mmol/L (ref 135–145)

## 2020-02-24 LAB — TROPONIN I (HIGH SENSITIVITY)
Troponin I (High Sensitivity): 4 ng/L (ref <18)
Troponin I (High Sensitivity): 4 ng/L (ref <18)

## 2020-02-24 MED ORDER — POTASSIUM CHLORIDE CRYS ER 20 MEQ PO TBCR
40.0000 meq | EXTENDED_RELEASE_TABLET | Freq: Once | ORAL | Status: AC
  Administered 2020-02-24: 40 meq via ORAL
  Filled 2020-02-24: qty 2

## 2020-02-24 NOTE — ED Triage Notes (Signed)
Pt reports cp that is tight in nature and intermittent since Thursday night. Pt also reports a cough

## 2020-02-24 NOTE — Telephone Encounter (Signed)
Pt called in c/o a tightness in her chest above her ribcage on both sides all weekend.   The pain started late Thurs. Night.  Been mostly constant since then.   She rated it an "8" on 0/10 pain scale.  Denied dizziness, sweating, shortness of breath or cardiac history.   She had some nausea Thursday night when the pain started.    She did have a "mini stroke" 4 yrs ago and is on blood thinners.  See triage notes.  I have referred her to the ED which she is agreeable to going.   She's going to [facility].  She preferred to drive herself because she lives very close to the hospital.  She replied,  "I'm fine to drive".  "I took the grand kids to school this morning".  I sent my notes to [facility] so Fenton Malling, PA-C would be aware of the referral.  Reason for Disposition . [1] Chest pain lasts > 5 minutes AND [2] age > 44 AND [3] one or more cardiac risk factors (e.g., diabetes, high blood pressure, high cholesterol, smoker, or strong family history of heart disease)    Had a "mini stroke" 4 yrs ago and has diabetes.  Answer Assessment - Initial Assessment Questions 1. LOCATION: "Where does it hurt?"       I'm having tightness in my chest that started late Thur. Night.   It's been going on all weekend.   It's non stop..    I'm having some coughing too. 2. RADIATION: "Does the pain go anywhere else?" (e.g., into neck, jaw, arms, back)     Tightness above my rib cage on both sides.  Denies radiation. 3. ONSET: "When did the chest pain begin?" (Minutes, hours or days)      Thurs. night 4. PATTERN "Does the pain come and go, or has it been constant since it started?"  "Does it get worse with exertion?"      Mostly constant. 5. DURATION: "How long does it last" (e.g., seconds, minutes, hours)     constant 6. SEVERITY: "How bad is the pain?"  (e.g., Scale 1-10; mild, moderate, or severe)    - MILD (1-3): doesn't interfere with normal activities     -  MODERATE (4-7): interferes with normal activities or awakens from sleep    - SEVERE (8-10): excruciating pain, unable to do any normal activities       8 on scale 7. CARDIAC RISK FACTORS: "Do you have any history of heart problems or risk factors for heart disease?" (e.g., angina, prior heart attack; diabetes, high blood pressure, high cholesterol, smoker, or strong family history of heart disease)     I had mini stroke 4 yrs ago.  I'm on blood thinners. No diabetes.  Hypertension. 8. PULMONARY RISK FACTORS: "Do you have any history of lung disease?"  (e.g., blood clots in lung, asthma, emphysema, birth control pills)     No.    9. CAUSE: "What do you think is causing the chest pain?"     Maybe indigestion. 10. OTHER SYMPTOMS: "Do you have any other symptoms?" (e.g., dizziness, nausea, vomiting, sweating, fever, difficulty breathing, cough)       No sweating, nausea Thur. Night,  11. PREGNANCY: "Is there any chance you are pregnant?" "When was your last menstrual period?"       N/A  Protocols used: CHEST PAIN-A-AH

## 2020-02-24 NOTE — ED Provider Notes (Signed)
[facility] Emergency Department Provider Note   ____________________________________________   First MD Initiated Contact with Patient 02/24/20 1227     (approximate)  I have reviewed the triage vital signs and the nursing notes.   HISTORY  Chief Complaint Chest Pain and Cough    HPI MIKIYA NEBERGALL is a 64 y.o. female with past medical history of hypertension, hyperlipidemia, and GERD who presents to the ED complaining of chest pain.  Patient reports that she has been dealing with intermittent type pain in the center of her chest for the past 4 days.  Is been associated with a cough productive of whitish sputum, but she denies any fevers or shortness of breath.  She does state that she has been dealing with diarrhea recently and is also felt nauseous at times, but has not vomited.  She denies any abdominal pain and has not had any dysuria or hematuria.  She has not been vaccinated against COVID-19.        Past Medical History:  Diagnosis Date  . Allergy   . Anemia    takes Ferrous Sulfate daily  . Arthritis   . GERD (gastroesophageal reflux disease)    takes OTC med  . Hyperlipidemia   . Hypertension    takes Amlodipine daily  . Low back pain    occasionally    Patient Active Problem List   Diagnosis Date Noted  . History of colonic polyps   . Polyp of descending colon   . Chronic right-sided low back pain without sciatica 04/17/2018  . History of CVA (cerebrovascular accident) 03/14/2017  . Gastritis   . BPPV (benign paroxysmal positional vertigo) 03/24/2015  . Right hip pain 01/07/2015  . Absolute anemia 01/06/2015  . BMI 28.0-28.9,adult 01/06/2015  . Cervical disc disease 01/06/2015  . Atypical chest pain 01/06/2015  . Generalized pruritus 01/06/2015  . Acid reflux 01/06/2015  . Cervicogenic headache 01/06/2015  . Hypercholesteremia 01/06/2015  . Low back pain 01/06/2015  . Pseudoarthrosis of cervical spine ([place]) 08/26/2013  .  Accumulation of fluid in tissues 12/24/2009  . Avitaminosis D 10/15/2009  . Cardiac conduction disorder 09/30/2008  . Essential (primary) hypertension 09/30/2008  . Blood in the urine 09/30/2008    Past Surgical History:  Procedure Laterality Date  . ANTERIOR CERVICAL DECOMP/DISCECTOMY FUSION  10/19/2011   Procedure: ANTERIOR CERVICAL DECOMPRESSION/DISCECTOMY FUSION 1 LEVEL/HARDWARE REMOVAL;  Surgeon: Elaina Hoops, MD;  Location: [facility];  Service: Neurosurgery;  Laterality: N/A;  Cervical four - five  Anterior cervical decompression fusion with redo at six - seven  Rm 33  . CARPAL TUNNEL RELEASE    . COLONOSCOPY  08/2014   polyps  . COLONOSCOPY WITH PROPOFOL N/A 02/04/2020   Procedure: COLONOSCOPY WITH PROPOFOL;  Surgeon: [name], MD;  Location: [facility] ENDOSCOPY;  Service: Endoscopy;  Laterality: N/A;  . ESOPHAGOGASTRODUODENOSCOPY (EGD) WITH PROPOFOL N/A 06/23/2015   Procedure: ESOPHAGOGASTRODUODENOSCOPY (EGD) WITH PROPOFOL;  Surgeon: [name], MD;  Location: [facility] ENDOSCOPY;  Service: Endoscopy;  Laterality: N/A;  . NECK SURGERY    . POSTERIOR CERVICAL FUSION/FORAMINOTOMY N/A 08/26/2013   Procedure: C/4-5,C/5-6,C/6-7 Posterior Cervical Fusion w/lateral mass fixation;  Surgeon: Elaina Hoops, MD;  Location: [facility];  Service: Neurosurgery;  Laterality: N/A;  . TUBAL LIGATION      Prior to Admission medications   Medication Sig Start Date End Date Taking? Authorizing Provider  acetaminophen (TYLENOL) 500 MG tablet Take 2 tablets (1,000 mg total) by mouth every 6 (six)  hours as needed. 03/22/17   Mar Daring, PA-C  albuterol (VENTOLIN HFA) 108 (90 Base) MCG/ACT inhaler INHALE 2 PUFFS INTO THE LUNGS EVERY 6 HOURS AS NEEDED FOR WHEEZING OR SHORTNESS OF BREATH 01/16/19   Mar Daring, PA-C  amLODipine (NORVASC) 10 MG tablet TAKE 1 TABLET BY MOUTH EVERY DAY 01/02/20   Mar Daring, PA-C  atorvastatin (LIPITOR) 40 MG tablet TAKE 1 TABLET BY MOUTH EVERY DAY  01/02/20   Mar Daring, PA-C  azelastine (OPTIVAR) 0.05 % ophthalmic solution Place 1 drop into both eyes 2 (two) times daily. 01/02/19   Mar Daring, PA-C  Cholecalciferol (VITAMIN D) 2000 UNITS tablet Take 2,000 Units by mouth daily.    [provider]  clopidogrel (PLAVIX) 75 MG tablet TAKE 1 TABLET BY MOUTH EVERY DAY 01/02/20   Mar Daring, PA-C  cyclobenzaprine (FLEXERIL) 5 MG tablet Take 1 tablet (5 mg total) by mouth at bedtime. 10/25/19   Mar Daring, PA-C  DEXILANT 60 MG capsule TAKE 1 CAPSULE BY MOUTH EVERY DAY 09/27/19   Mar Daring, PA-C  ferrous sulfate 325 (65 FE) MG tablet Take 1 tablet (325 mg total) by mouth daily with breakfast. 06/25/18   Mar Daring, PA-C  fluticasone [facility]) 50 MCG/ACT nasal spray USE 2 SPRAYS IN EACH NOSTRIL ONCE DAILY 10/30/18   Mar Daring, PA-C  loratadine (CLARITIN) 10 MG tablet Take 1 tablet (10 mg total) by mouth daily. 10/20/17   Mar Daring, PA-C  Omega-3 Fatty Acids (FISH OIL) 1200 MG CAPS Take 1 capsule by mouth daily.    [provider]  Potassium 75 MG TABS Take by mouth.    [provider]  potassium chloride (KLOR-CON) 10 MEQ tablet potassium chloride ER 10 mEq tablet,extended release    [provider]  sucralfate (CARAFATE) 1 g tablet TAKE 1 TABLET BY MOUTH FOUR TIMES DAILY AT BEDTIME WITH MEALS 01/02/19   Mar Daring, PA-C    Allergies Aspirin, Hydrochlorothiazide, Ibuprofen, Lisinopril, and Sulfa antibiotics  Family History  Problem Relation Age of Onset  . Hypertension Mother   . Heart disease Mother   . Breast cancer Neg Hx     Social History Social History   Tobacco Use  . Smoking status: Never Smoker  . Smokeless tobacco: Never Used  Vaping Use  . Vaping Use: Never used  Substance Use Topics  . Alcohol use: No  . Drug use: No    Review of Systems  Constitutional: No fever/chills Eyes: No visual changes. ENT:  No sore throat. Cardiovascular: Positive for chest pain. Respiratory: Denies shortness of breath.  Positive for cough. Gastrointestinal: No abdominal pain.  No nausea, no vomiting.  Positive for diarrhea.  No constipation. Genitourinary: Negative for dysuria. Musculoskeletal: Negative for back pain. Skin: Negative for rash. Neurological: Negative for headaches, focal weakness or numbness.  ____________________________________________   PHYSICAL EXAM:  VITAL SIGNS: ED Triage Vitals  Enc Vitals Group     BP 02/24/20 0810 (!) 136/70     Pulse Rate 02/24/20 0810 61     Resp 02/24/20 0810 19     Temp 02/24/20 0810 99.2 F (37.3 C)     Temp Source 02/24/20 0810 Oral     SpO2 02/24/20 0810 96 %     Weight 02/24/20 0808 152 lb (68.9 kg)     Height 02/24/20 0808 5\' 5"  (1.651 m)     Head Circumference --      Peak Flow --  Pain Score 02/24/20 0808 8     Pain Loc --      Pain Edu? --      Excl. in [place]? --     Constitutional: Alert and oriented. Eyes: Conjunctivae are normal. Head: Atraumatic. Nose: No congestion/rhinnorhea. Mouth/Throat: Mucous membranes are moist. Neck: Normal ROM Cardiovascular: Normal rate, regular rhythm. Grossly normal heart sounds. Respiratory: Normal respiratory effort.  No retractions. Lungs CTAB. Gastrointestinal: Soft and nontender. No distention. Genitourinary: deferred Musculoskeletal: No lower extremity tenderness nor edema. Neurologic:  Normal speech and language. No gross focal neurologic deficits are appreciated. Skin:  Skin is warm, dry and intact. No rash noted. Psychiatric: Mood and affect are normal. Speech and behavior are normal.  ____________________________________________   LABS (all labs ordered are listed, but only abnormal results are displayed)  Labs Reviewed  BASIC METABOLIC PANEL - Abnormal; Notable for the following components:      Result Value   Potassium 3.1 (*)    Glucose, Bld 114 (*)    BUN 7 (*)    All other  components within normal limits  SARS CORONAVIRUS 2 (TAT 6-24 HRS)  CBC  TROPONIN I (HIGH SENSITIVITY)  TROPONIN I (HIGH SENSITIVITY)   ____________________________________________  EKG  ED ECG REPORT I, Blake Divine, the attending physician, personally viewed and interpreted this ECG.   Date: 02/24/2020  EKG Time: 8:05  Rate: 65  Rhythm: normal sinus rhythm  Axis: Normal  Intervals:none  ST&T Change: None   PROCEDURES  Procedure(s) performed (including Critical Care):  Procedures   ____________________________________________   INITIAL IMPRESSION / ASSESSMENT AND PLAN / ED COURSE       64 year old female with past medical history of hypertension, hyperlipidemia, and GERD who presents to the ED complaining of intermittent type pain in the center of her chest for the past 4 days.  Pain is currently mild and atypical, EKG shows no evidence of arrhythmia or ischemia and initial troponin within normal limits.  I have a low suspicion for ACS as her symptoms sound more likely related to COVID-19 versus viral bronchitis.  We will screen second set troponin and if this is negative, we may rule out ACS given heart score of less than 4.  Chest x-ray is also negative for acute process and shows no evidence of pneumonia.  We will perform testing for COVID-19 and reassess.  Repeat troponin is within normal limits, patient with minimal chest pain at this time.  Suspect COVID-19 versus other viral bronchitis and patient is appropriate for discharge home.  She was counseled to quarantine until results of her Covid testing are known.  She was counseled to return to the ED for new worsening symptoms, patient agrees with plan.      ____________________________________________   FINAL CLINICAL IMPRESSION(S) / ED DIAGNOSES  Final diagnoses:  Bronchitis  Suspected COVID-19 virus infection     ED Discharge Orders    None       Note:  This document was prepared using Dragon  voice recognition software and may include unintentional dictation errors.   Blake Divine, MD 02/24/20 1524

## 2020-02-24 NOTE — ED Notes (Signed)
E-signature not working at this time. Pt verbalized understanding of D/C instructions, prescriptions and follow up care with no further questions at this time. Pt in NAD and ambulatory at time of D/C.  

## 2020-02-25 LAB — SARS CORONAVIRUS 2 (TAT 6-24 HRS): SARS Coronavirus 2: NEGATIVE

## 2020-03-05 ENCOUNTER — Telehealth (INDEPENDENT_AMBULATORY_CARE_PROVIDER_SITE_OTHER): Payer: Medicare Other | Admitting: Physician Assistant

## 2020-03-05 ENCOUNTER — Encounter: Payer: Self-pay | Admitting: Physician Assistant

## 2020-03-05 DIAGNOSIS — Z8709 Personal history of other diseases of the respiratory system: Secondary | ICD-10-CM | POA: Diagnosis not present

## 2020-03-05 DIAGNOSIS — Z7189 Other specified counseling: Secondary | ICD-10-CM

## 2020-03-05 DIAGNOSIS — Z7185 Encounter for immunization safety counseling: Secondary | ICD-10-CM

## 2020-03-05 NOTE — Progress Notes (Signed)
MyChart Video Visit    Virtual Visit via Video Note   This visit type was conducted due to national recommendations for restrictions regarding the COVID-19 Pandemic (e.g. social distancing) in an effort to limit this patient's exposure and mitigate transmission in our community. This patient is at least at moderate risk for complications without adequate follow up. This format is felt to be most appropriate for this patient at this time. Physical exam was limited by quality of the video and audio technology used for the visit.   I connected with [name] on 03/05/20 at  2:40 PM EDT by a video enabled telemedicine application and verified that I am speaking with the correct person using two identifiers.  I discussed the limitations of evaluation and management by telemedicine and the availability of in person appointments. The patient expressed understanding and agreed to proceed.  Patient location: home Provider location: [facility]   Patient: [name]   DOB: 1955/12/10   64 y.o. Female  MRN: [id number] Visit Date: 03/05/2020  Today's healthcare provider: Mar Daring, PA-C   Chief Complaint  Patient presents with  . Bronchitis   I,Latasha Walston,acting as a Education administrator for [employer], PA-C.,have documented all relevant documentation on the behalf of Mar Daring, PA-C,as directed by  Mar Daring, PA-C while in the presence of Mar Daring, [place].  Subjective    HPI  Follow up ER visit  Patient was seen in ER for chest pain and SOB on 02/24/2020. She was treated for bronchitis and suspected COVID virus. Treatment for this included patient advised to continue inhaler. She reports good compliance with treatment. She reports this condition is Improved.  -----------------------------------------------------------------------------------------  Patient Active Problem List   Diagnosis Date Noted  . History of colonic polyps   . Polyp of  descending colon   . Chronic right-sided low back pain without sciatica 04/17/2018  . History of CVA (cerebrovascular accident) 03/14/2017  . Gastritis   . BPPV (benign paroxysmal positional vertigo) 03/24/2015  . Right hip pain 01/07/2015  . Absolute anemia 01/06/2015  . BMI 28.0-28.9,adult 01/06/2015  . Cervical disc disease 01/06/2015  . Atypical chest pain 01/06/2015  . Generalized pruritus 01/06/2015  . Acid reflux 01/06/2015  . Cervicogenic headache 01/06/2015  . Hypercholesteremia 01/06/2015  . Low back pain 01/06/2015  . Pseudoarthrosis of cervical spine ([place]) 08/26/2013  . Accumulation of fluid in tissues 12/24/2009  . Avitaminosis D 10/15/2009  . Cardiac conduction disorder 09/30/2008  . Essential (primary) hypertension 09/30/2008  . Blood in the urine 09/30/2008   Past Medical History:  Diagnosis Date  . Allergy   . Anemia    takes Ferrous Sulfate daily  . Arthritis   . GERD (gastroesophageal reflux disease)    takes OTC med  . Hyperlipidemia   . Hypertension    takes Amlodipine daily  . Low back pain    occasionally      Medications: Outpatient Medications Prior to Visit  Medication Sig  . acetaminophen (TYLENOL) 500 MG tablet Take 2 tablets (1,000 mg total) by mouth every 6 (six) hours as needed.  Marland Kitchen albuterol (VENTOLIN HFA) 108 (90 Base) MCG/ACT inhaler INHALE 2 PUFFS INTO THE LUNGS EVERY 6 HOURS AS NEEDED FOR WHEEZING OR SHORTNESS OF BREATH  . amLODipine (NORVASC) 10 MG tablet TAKE 1 TABLET BY MOUTH EVERY DAY  . atorvastatin (LIPITOR) 40 MG tablet TAKE 1 TABLET BY MOUTH EVERY DAY  . azelastine (OPTIVAR) 0.05 % ophthalmic solution  Place 1 drop into both eyes 2 (two) times daily.  . Cholecalciferol (VITAMIN D) 2000 UNITS tablet Take 2,000 Units by mouth daily.  . clopidogrel (PLAVIX) 75 MG tablet TAKE 1 TABLET BY MOUTH EVERY DAY  . cyclobenzaprine (FLEXERIL) 5 MG tablet Take 1 tablet (5 mg total) by mouth at bedtime.  Marland Kitchen DEXILANT 60 MG capsule TAKE 1  CAPSULE BY MOUTH EVERY DAY  . ferrous sulfate 325 (65 FE) MG tablet Take 1 tablet (325 mg total) by mouth daily with breakfast.  . fluticasone (FLONASE) 50 MCG/ACT nasal spray USE 2 SPRAYS IN EACH NOSTRIL ONCE DAILY  . loratadine (CLARITIN) 10 MG tablet Take 1 tablet (10 mg total) by mouth daily.  . Omega-3 Fatty Acids (FISH OIL) 1200 MG CAPS Take 1 capsule by mouth daily.  . Potassium 75 MG TABS Take by mouth.  . potassium chloride (KLOR-CON) 10 MEQ tablet potassium chloride ER 10 mEq tablet,extended release  . sucralfate (CARAFATE) 1 g tablet TAKE 1 TABLET BY MOUTH FOUR TIMES DAILY AT BEDTIME WITH MEALS   No facility-administered medications prior to visit.    Review of Systems  Constitutional: Negative.   Respiratory: Negative.   Cardiovascular: Negative.   Musculoskeletal: Negative.   Neurological: Negative.     Last CBC Lab Results  Component Value Date   WBC 6.7 02/24/2020   HGB 14.1 02/24/2020   HCT 40.7 02/24/2020   MCV 84.1 02/24/2020   MCH 29.1 02/24/2020   RDW 12.7 02/24/2020   PLT 339 16/07/930   Last metabolic panel Lab Results  Component Value Date   GLUCOSE 114 (H) 02/24/2020   NA 139 02/24/2020   K 3.1 (L) 02/24/2020   CL 101 02/24/2020   CO2 28 02/24/2020   BUN 7 (L) 02/24/2020   CREATININE 0.74 02/24/2020   GFRNONAA >60 02/24/2020   GFRAA >60 02/24/2020   CALCIUM 9.1 02/24/2020   PROT 7.3 03/08/2019   ALBUMIN 4.4 03/08/2019   LABGLOB 2.9 03/08/2019   AGRATIO 1.5 03/08/2019   BILITOT 0.2 03/08/2019   ALKPHOS 102 03/08/2019   AST 12 03/08/2019   ALT 6 03/08/2019   ANIONGAP 10 02/24/2020      Objective    There were no vitals taken for this visit. BP Readings from Last 3 Encounters:  02/24/20 123/80  02/04/20 124/65  10/25/19 121/76   Wt Readings from Last 3 Encounters:  02/24/20 152 lb (68.9 kg)  02/04/20 173 lb (78.5 kg)  10/25/19 177 lb 3.2 oz (80.4 kg)      Physical Exam Vitals reviewed.  Constitutional:      General: She is  not in acute distress.    Appearance: Normal appearance. She is well-developed.  HENT:     Head: Normocephalic and atraumatic.  Pulmonary:     Effort: Pulmonary effort is normal. No respiratory distress.  Musculoskeletal:     Cervical back: Normal range of motion and neck supple.  Neurological:     Mental Status: She is alert.  Psychiatric:        Mood and Affect: Mood normal.        Behavior: Behavior normal.        Thought Content: Thought content normal.        Judgment: Judgment normal.        Assessment & Plan     1. History of bronchitis Improved. Not having to use inhaler currently. Has f/u for CPE on 03/19/20. Call if worsening symptoms occur in the meantime.   2.  Vaccine counseling Discussed Covid-19 vaccine. Answered patients questions. Advised she can call or message if other questions arise. She is going to call her pharmacy to set up vaccination.   No follow-ups on file.     I discussed the assessment and treatment plan with the patient. The patient was provided an opportunity to ask questions and all were answered. The patient agreed with the plan and demonstrated an understanding of the instructions.   The patient was advised to call back or seek an in-person evaluation if the symptoms worsen or if the condition fails to improve as anticipated.  I provided 14 minutes of non-face-to-face time during this encounter.  Reynolds Bowl, PA-C, have reviewed all documentation for this visit. The documentation on 03/05/20 for the exam, diagnosis, procedures, and orders are all accurate and complete.  [place] [facility] [phone] (phone) [phone] (fax)  [place]

## 2020-03-09 ENCOUNTER — Other Ambulatory Visit: Payer: Self-pay | Admitting: Physician Assistant

## 2020-03-09 NOTE — Telephone Encounter (Signed)
Medication Refill - Medication: Cyclobenzaprine 5mg   Has the patient contacted their pharmacy? No. No refills left (Agent: If no, request that the patient contact the pharmacy for the refill.) (Agent: If yes, when and what did the pharmacy advise?)  Preferred Pharmacy (with phone number or street name): [facility] [facility] [address]: Please be advised that RX refills may take up to 3 business days. We ask that you follow-up with your pharmacy.

## 2020-03-09 NOTE — Telephone Encounter (Signed)
Requested medication (s) are due for refill today: Yes  Requested medication (s) are on the active medication list: Yes  Last refill:  10/25/19  Future visit scheduled: Yes  Notes to clinic:  See request.    Requested Prescriptions  Pending Prescriptions Disp Refills   cyclobenzaprine (FLEXERIL) 5 MG tablet 90 tablet 0    Sig: Take 1 tablet (5 mg total) by mouth at bedtime.      Not Delegated - Analgesics:  Muscle Relaxants Failed - 03/09/2020  2:13 PM      Failed - This refill cannot be delegated      Passed - Valid encounter within last 6 months    Recent Outpatient Visits           4 days ago History of bronchitis   [place], [place], [place]   4 months ago Essential hypertension   [facility] [place], [place]   6 months ago Possible urinary tract infection   [place], [place], [place]   9 months ago Strain of right quadriceps, initial encounter   [place], [place]   11 months ago Bug bite, initial encounter   [facility], Clearn[place] Sorrel, [place]       Future Appointments             In 1 week Marlyn Corporal, Clearnce Sorrel, PA-C [facility], [place]

## 2020-03-11 ENCOUNTER — Encounter: Payer: Self-pay | Admitting: Physician Assistant

## 2020-03-12 ENCOUNTER — Other Ambulatory Visit: Payer: Self-pay | Admitting: Physician Assistant

## 2020-03-12 NOTE — Telephone Encounter (Signed)
Requested medication (s) are due for refill today: no  Requested medication (s) are on the active medication list: yes   Last refill:  10/25/2019  Future visit scheduled: yes  Notes to clinic:  this refill cannot be delegated    Requested Prescriptions  Pending Prescriptions Disp Refills   cyclobenzaprine (FLEXERIL) 5 MG tablet [Pharmacy Med Name: CYCLOBENZAPRINE 5MG  TABLETS] 90 tablet 0    Sig: TAKE 1 TABLET(5 MG) BY MOUTH AT BEDTIME      Not Delegated - Analgesics:  Muscle Relaxants Failed - 03/12/2020  7:25 AM      Failed - This refill cannot be delegated      Passed - Valid encounter within last 6 months    Recent Outpatient Visits           1 week ago History of bronchitis   [facility] [place], [place], [place]   4 months ago Essential hypertension   [place], [place], [place]   6 months ago Possible urinary tract infection   [place], [place], [place]   10 months ago Strain of right quadriceps, initial encounter   [place], [place]   11 months ago Bug bite, initial encounter   [facility], Clearnce Sorrel, [place]       Future Appointments             In 1 week Marlyn Corporal, Clearnce Sorrel, PA-C [employer] [employer], [place]

## 2020-03-17 NOTE — Progress Notes (Signed)
Complete physical exam   Patient: [name]   DOB: 05/12/1956   64 y.o. Female  MRN: [id number] Visit Date: 03/19/2020  Today's healthcare provider: Mar Daring, PA-C   Chief Complaint  Patient presents with  . Annual Exam   Subjective    [name] is a 64 y.o. female who presents today for a complete physical exam.  She reports consuming a general diet. The patient does not participate in regular exercise at present. She generally feels well. She reports sleeping well. She does not have additional problems to discuss today.  Declines flu vaccine  Past Medical History:  Diagnosis Date  . Allergy   . Anemia    takes Ferrous Sulfate daily  . Arthritis   . GERD (gastroesophageal reflux disease)    takes OTC med  . Hyperlipidemia   . Hypertension    takes Amlodipine daily  . Low back pain    occasionally   Past Surgical History:  Procedure Laterality Date  . ANTERIOR CERVICAL DECOMP/DISCECTOMY FUSION  10/19/2011   Procedure: ANTERIOR CERVICAL DECOMPRESSION/DISCECTOMY FUSION 1 LEVEL/HARDWARE REMOVAL;  Surgeon: Elaina Hoops, MD;  Location: [facility];  Service: Neurosurgery;  Laterality: N/A;  Cervical four - five  Anterior cervical decompression fusion with redo at six - seven  Rm 33  . CARPAL TUNNEL RELEASE    . COLONOSCOPY  08/2014   polyps  . COLONOSCOPY WITH PROPOFOL N/A 02/04/2020   Procedure: COLONOSCOPY WITH PROPOFOL;  Surgeon: [name], MD;  Location: [facility] ENDOSCOPY;  Service: Endoscopy;  Laterality: N/A;  . ESOPHAGOGASTRODUODENOSCOPY (EGD) WITH PROPOFOL N/A 06/23/2015   Procedure: ESOPHAGOGASTRODUODENOSCOPY (EGD) WITH PROPOFOL;  Surgeon: [name], MD;  Location: [facility] ENDOSCOPY;  Service: Endoscopy;  Laterality: N/A;  . NECK SURGERY    . POSTERIOR CERVICAL FUSION/FORAMINOTOMY N/A 08/26/2013   Procedure: C/4-5,C/5-6,C/6-7 Posterior Cervical Fusion w/lateral mass fixation;  Surgeon: Elaina Hoops, MD;  Location: [place] [facility];  Service:  Neurosurgery;  Laterality: N/A;  . TUBAL LIGATION     Social History   Socioeconomic History  . Marital status: Single    Spouse name: Not on file  . Number of children: 3  . Years of education: Not on file  . Highest education level: Some college, no degree  Occupational History  . Occupation: disable  Tobacco Use  . Smoking status: Never Smoker  . Smokeless tobacco: Never Used  Vaping Use  . Vaping Use: Never used  Substance and Sexual Activity  . Alcohol use: No  . Drug use: No  . Sexual activity: Not on file  Other Topics Concern  . Not on file  Social History Narrative  . Not on file   Social Determinants of Health   Financial Resource Strain: Low Risk   . Difficulty of Paying Living Expenses: Not hard at all  Food Insecurity: No Food Insecurity  . Worried About Charity fundraiser in the Last Year: Never true  . Ran Out of Food in the Last Year: Never true  Transportation Needs: No Transportation Needs  . Lack of Transportation (Medical): No  . Lack of Transportation (Non-Medical): No  Physical Activity: Inactive  . Days of Exercise per Week: 0 days  . Minutes of Exercise per Session: 0 min  Stress: Stress Concern Present  . Feeling of Stress : To some extent  Social Connections: Moderately Isolated  . Frequency of Communication with Friends and Family: More than three times a week  . Frequency  of Social Gatherings with Friends and Family: More than three times a week  . Attends Religious Services: More than 4 times per year  . Active Member of Clubs or Organizations: No  . Attends Archivist Meetings: Never  . Marital Status: Never married  Intimate Partner Violence: Not At Risk  . Fear of Current or Ex-Partner: No  . Emotionally Abused: No  . Physically Abused: No  . Sexually Abused: No   Family Status  Relation Name Status  . Mother  Alive  . Daughter  Deceased       unknown causes  . Father  Other  . Sister  Alive  . Brother  Alive  .  Son  Alive  . Sister  Alive  . Sister  Alive  . Son  Alive  . Sister  Deceased  . Neg Hx  (Not Specified)   Family History  Problem Relation Age of Onset  . Hypertension Mother   . Heart disease Mother   . Kidney failure Sister   . Breast cancer Neg Hx    Allergies  Allergen Reactions  . Aspirin Nausea Only  . Hydrochlorothiazide     Rash  . Ibuprofen Other (See Comments)    Pt says her doctor doesn't want her taking this with the blood thinner she's taking  . Lisinopril     Other reaction(s): Unknown  . Sulfa Antibiotics Hives and Nausea Only    Patient Care Team: Mar Daring, PA-C as PCP - General (Family Medicine) [name], MD as Consulting Physician (Gastroenterology) Kary Kos, MD as Consulting Physician (Neurosurgery)   Medications: Outpatient Medications Prior to Visit  Medication Sig  . acetaminophen (TYLENOL) 500 MG tablet Take 2 tablets (1,000 mg total) by mouth every 6 (six) hours as needed.  Marland Kitchen albuterol (VENTOLIN HFA) 108 (90 Base) MCG/ACT inhaler INHALE 2 PUFFS INTO THE LUNGS EVERY 6 HOURS AS NEEDED FOR WHEEZING OR SHORTNESS OF BREATH  . azelastine (OPTIVAR) 0.05 % ophthalmic solution Place 1 drop into both eyes 2 (two) times daily.  . Cholecalciferol (VITAMIN D) 2000 UNITS tablet Take 2,000 Units by mouth daily.  . cyclobenzaprine (FLEXERIL) 5 MG tablet TAKE 1 TABLET(5 MG) BY MOUTH AT BEDTIME  . ferrous sulfate 325 (65 FE) MG tablet Take 1 tablet (325 mg total) by mouth daily with breakfast.  . fluticasone (FLONASE) 50 MCG/ACT nasal spray USE 2 SPRAYS IN EACH NOSTRIL ONCE DAILY  . loratadine (CLARITIN) 10 MG tablet Take 1 tablet (10 mg total) by mouth daily.  . Omega-3 Fatty Acids (FISH OIL) 1200 MG CAPS Take 1 capsule by mouth daily.  . potassium chloride (KLOR-CON) 10 MEQ tablet Take 10 mEq by mouth daily. M-F only  . [DISCONTINUED] amLODipine (NORVASC) 10 MG tablet TAKE 1 TABLET BY MOUTH EVERY DAY  . [DISCONTINUED] atorvastatin (LIPITOR) 40  MG tablet TAKE 1 TABLET BY MOUTH EVERY DAY  . [DISCONTINUED] clopidogrel (PLAVIX) 75 MG tablet TAKE 1 TABLET BY MOUTH EVERY DAY  . [DISCONTINUED] DEXILANT 60 MG capsule TAKE 1 CAPSULE BY MOUTH EVERY DAY  . [DISCONTINUED] Potassium 75 MG TABS Take by mouth. (Patient not taking: Reported on 03/19/2020)  . [DISCONTINUED] sucralfate (CARAFATE) 1 g tablet TAKE 1 TABLET BY MOUTH FOUR TIMES DAILY AT BEDTIME WITH MEALS   No facility-administered medications prior to visit.    Review of Systems  Constitutional: Negative.   HENT: Negative.   Eyes: Negative.   Respiratory: Negative.   Cardiovascular: Negative.   Gastrointestinal: Negative.  Endocrine: Negative.   Genitourinary: Negative.   Musculoskeletal: Negative.   Skin: Negative.   Allergic/Immunologic: Negative.   Neurological: Negative.   Hematological: Negative.   Psychiatric/Behavioral: Negative.     Last CBC Lab Results  Component Value Date   WBC 6.7 02/24/2020   HGB 14.1 02/24/2020   HCT 40.7 02/24/2020   MCV 84.1 02/24/2020   MCH 29.1 02/24/2020   RDW 12.7 02/24/2020   PLT 339 10/62/6948   Last metabolic panel Lab Results  Component Value Date   GLUCOSE 114 (H) 02/24/2020   NA 139 02/24/2020   K 3.1 (L) 02/24/2020   CL 101 02/24/2020   CO2 28 02/24/2020   BUN 7 (L) 02/24/2020   CREATININE 0.74 02/24/2020   GFRNONAA >60 02/24/2020   GFRAA >60 02/24/2020   CALCIUM 9.1 02/24/2020   PROT 7.3 03/08/2019   ALBUMIN 4.4 03/08/2019   LABGLOB 2.9 03/08/2019   AGRATIO 1.5 03/08/2019   BILITOT 0.2 03/08/2019   ALKPHOS 102 03/08/2019   AST 12 03/08/2019   ALT 6 03/08/2019   ANIONGAP 10 02/24/2020      Objective    BP 136/68   Pulse 83   Temp 98.2 F (36.8 C)   Ht $R'5\' 5"'wX$  (1.651 m)   Wt 175 lb (79.4 kg)   BMI 29.12 kg/m  BP Readings from Last 3 Encounters:  03/19/20 136/68  03/19/20 136/68  02/24/20 123/80   Wt Readings from Last 3 Encounters:  03/19/20 175 lb (79.4 kg)  03/19/20 175 lb 6.4 oz (79.6 kg)    02/24/20 152 lb (68.9 kg)      Physical Exam Vitals reviewed.  Constitutional:      General: She is not in acute distress.    Appearance: Normal appearance. She is well-developed, well-groomed and overweight. She is not diaphoretic.  HENT:     Head: Normocephalic and atraumatic.     Right Ear: Tympanic membrane, ear canal and external ear normal.     Left Ear: Tympanic membrane, ear canal and external ear normal.  Eyes:     General: No scleral icterus.       Right eye: No discharge.        Left eye: No discharge.     Extraocular Movements: Extraocular movements intact.     Conjunctiva/sclera: Conjunctivae normal.     Pupils: Pupils are equal, round, and reactive to light.  Neck:     Thyroid: No thyromegaly.     Vascular: No carotid bruit or JVD.     Trachea: No tracheal deviation.  Cardiovascular:     Rate and Rhythm: Normal rate and regular rhythm.     Pulses: Normal pulses.     Heart sounds: Normal heart sounds. No murmur heard.  No friction rub. No gallop.   Pulmonary:     Effort: Pulmonary effort is normal. No respiratory distress.     Breath sounds: Normal breath sounds. No wheezing or rales.  Chest:     Chest wall: No tenderness.  Abdominal:     General: Abdomen is flat. Bowel sounds are normal. There is no distension.     Palpations: Abdomen is soft. There is no mass.     Tenderness: There is no abdominal tenderness. There is no guarding or rebound.  Musculoskeletal:        General: No tenderness. Normal range of motion.     Cervical back: Normal range of motion and neck supple.     Right lower leg: No edema.  Left lower leg: No edema.  Lymphadenopathy:     Cervical: No cervical adenopathy.  Skin:    General: Skin is warm and dry.     Capillary Refill: Capillary refill takes less than 2 seconds.     Findings: No rash.  Neurological:     General: No focal deficit present.     Mental Status: She is alert and oriented to person, place, and time. Mental  status is at baseline.  Psychiatric:        Mood and Affect: Mood normal.        Behavior: Behavior normal. Behavior is cooperative.        Thought Content: Thought content normal.        Judgment: Judgment normal.     Last depression screening scores PHQ 2/9 Scores 03/19/2020 03/05/2020 01/02/2019  PHQ - 2 Score 0 0 4  PHQ- 9 Score - - 6   Last fall risk screening Fall Risk  03/19/2020  Falls in the past year? 0  Number falls in past yr: 0  Injury with Fall? 0   Last Audit-C alcohol use screening Alcohol Use Disorder Test (AUDIT) 03/19/2020  1. How often do you have a drink containing alcohol? 0  2. How many drinks containing alcohol do you have on a typical day when you are drinking? 0  3. How often do you have six or more drinks on one occasion? 0  AUDIT-C Score 0  Alcohol Brief Interventions/Follow-up AUDIT Score <7 follow-up not indicated   A score of 3 or more in women, and 4 or more in men indicates increased risk for alcohol abuse, EXCEPT if all of the points are from question 1   No results found for any visits on 03/19/20.  Assessment & Plan    Routine Health Maintenance and Physical Exam  Exercise Activities and Dietary recommendations Goals    . Exercise 3x per week (30 min per time)     Recommend to walk 3 days a week for at least 30 minutes at a time.     . Increase water intake     Recommend increasing water intake to 4 glasses a day.       Immunization History  Administered Date(s) Administered  . Influenza-Unspecified 03/07/2017, 06/14/2018  . MMR 09/09/1987  . Tdap 09/30/2008, 01/02/2019    Health Maintenance  Topic Date Due  . INFLUENZA VACCINE  10/22/2020 (Originally 02/23/2020)  . MAMMOGRAM  01/20/2021  . PAP SMEAR-Modifier  12/09/2021  . COLONOSCOPY  02/04/2027  . TETANUS/TDAP  01/01/2029  . Hepatitis C Screening  Completed  . HIV Screening  Completed    Discussed health benefits of physical activity, and encouraged her to engage in  regular exercise appropriate for her age and condition.  1. Annual physical exam Normal physical exam today. Will check labs as below and f/u pending lab results. If labs are stable and WNL she will not need to have these rechecked for one year at her next annual physical exam. She is to call the office in the meantime if she has any acute issue, questions or concerns.  2. Essential hypertension Stable. Diagnosis pulled for medication refill. Continue current medical treatment plan. Will check labs as below and f/u pending results. - CBC with Differential/Platelet - Comprehensive metabolic panel - amLODipine (NORVASC) 10 MG tablet; Take 1 tablet (10 mg total) by mouth daily.  Dispense: 90 tablet; Refill: 1  3. Hypercholesteremia Stable on Atorvastatin $RemoveBeforeD'40mg'[id number]$ . Will check labs as below and  f/u pending results. - Lipid panel - TSH - Hemoglobin A1c - atorvastatin (LIPITOR) 40 MG tablet; Take 1 tablet (40 mg total) by mouth daily.  Dispense: 90 tablet; Refill: 1  4. Encounter for breast cancer screening using non-mammogram modality There is no family history of breast cancer. She does perform regular self breast exams. Mammogram was ordered as below. Information for [facility] was given to patient so she may schedule her mammogram at her convenience.  5. Gastroesophageal reflux disease with esophagitis Stable. Diagnosis pulled for medication refill. Continue current medical treatment plan. - sucralfate (CARAFATE) 1 g tablet; TAKE 1 TABLET BY MOUTH FOUR TIMES DAILY AT BEDTIME WITH MEALS  Dispense: 360 tablet; Refill: 0  6. History of CVA (cerebrovascular accident) Stable. Diagnosis pulled for medication refill. Continue current medical treatment plan. - clopidogrel (PLAVIX) 75 MG tablet; Take 1 tablet (75 mg total) by mouth daily.  Dispense: 90 tablet; Refill: 1  7. Gastroesophageal reflux disease without esophagitis Stable. Diagnosis pulled for medication refill. Continue current  medical treatment plan. - dexlansoprazole (DEXILANT) 60 MG capsule; Take 1 capsule (60 mg total) by mouth daily.  Dispense: 90 capsule; Refill: 1  8. Avitaminosis D H/o this and on low dose daily supplement. Will check labs as below and f/u pending results. - Vitamin D (25 hydroxy)  9. Iron deficiency anemia secondary to inadequate dietary iron intake H/O this and on once daily ferrous sulfate $RemoveBeforeD'325mg'[id number]$ . Will check labs as below and f/u pending results. - Fe+TIBC+Fer   Return in about 6 months (around 09/19/2020).     Reynolds Bowl, PA-C, have reviewed all documentation for this visit. The documentation on 03/19/20 for the exam, diagnosis, procedures, and orders are all accurate and complete.   Rubye Beach  [facility] [phone] (phone) [phone] (fax)  [place]

## 2020-03-18 NOTE — Progress Notes (Signed)
Subjective:   [name] is a 64 y.o. female who presents for Medicare Annual (Subsequent) preventive examination.  Review of Systems    N/A  Cardiac Risk Factors include: hypertension     Objective:    Today's Vitals   03/19/20 0947  BP: 136/68  Pulse: 83  Temp: 98.2 F (36.8 C)  TempSrc: Oral  SpO2: 97%  Weight: 175 lb 6.4 oz (79.6 kg)  Height: $Remove'5\' 5"'rsjlmxT$  (1.651 m)  PainSc: 0-No pain   Body mass index is 29.19 kg/m.  Advanced Directives 03/19/2020 02/04/2020 01/02/2019 07/13/2018 05/09/2018 09/04/2017 08/04/2017  Does Patient Have a Medical Advance Directive? Yes No No No No No No  Type of Paramedic of [place];Living will - - - - - -  Copy of [place] in Chart? No - copy requested - - - - - -  Would patient like information on creating a medical advance directive? - No - Patient declined No - Patient declined - No - Patient declined No - Patient declined No - Patient declined  Pre-existing out of facility DNR order (yellow form or pink MOST form) - - - - - - -    Current Medications (verified) Outpatient Encounter Medications as of 03/19/2020  Medication Sig  . acetaminophen (TYLENOL) 500 MG tablet Take 2 tablets (1,000 mg total) by mouth every 6 (six) hours as needed.  Marland Kitchen albuterol (VENTOLIN HFA) 108 (90 Base) MCG/ACT inhaler INHALE 2 PUFFS INTO THE LUNGS EVERY 6 HOURS AS NEEDED FOR WHEEZING OR SHORTNESS OF BREATH  . amLODipine (NORVASC) 10 MG tablet TAKE 1 TABLET BY MOUTH EVERY DAY  . atorvastatin (LIPITOR) 40 MG tablet TAKE 1 TABLET BY MOUTH EVERY DAY  . azelastine (OPTIVAR) 0.05 % ophthalmic solution Place 1 drop into both eyes 2 (two) times daily.  . Cholecalciferol (VITAMIN D) 2000 UNITS tablet Take 2,000 Units by mouth daily.  . clopidogrel (PLAVIX) 75 MG tablet TAKE 1 TABLET BY MOUTH EVERY DAY  . cyclobenzaprine (FLEXERIL) 5 MG tablet TAKE 1 TABLET(5 MG) BY MOUTH AT BEDTIME  . DEXILANT 60 MG capsule TAKE 1 CAPSULE BY  MOUTH EVERY DAY  . ferrous sulfate 325 (65 FE) MG tablet Take 1 tablet (325 mg total) by mouth daily with breakfast.  . fluticasone (FLONASE) 50 MCG/ACT nasal spray USE 2 SPRAYS IN EACH NOSTRIL ONCE DAILY  . loratadine (CLARITIN) 10 MG tablet Take 1 tablet (10 mg total) by mouth daily.  . Omega-3 Fatty Acids (FISH OIL) 1200 MG CAPS Take 1 capsule by mouth daily.  . potassium chloride (KLOR-CON) 10 MEQ tablet Take 10 mEq by mouth daily. M-F only  . sucralfate (CARAFATE) 1 g tablet TAKE 1 TABLET BY MOUTH FOUR TIMES DAILY AT BEDTIME WITH MEALS  . Potassium 75 MG TABS Take by mouth. (Patient not taking: Reported on 03/19/2020)   No facility-administered encounter medications on file as of 03/19/2020.    Allergies (verified) Aspirin, Hydrochlorothiazide, Ibuprofen, Lisinopril, and Sulfa antibiotics   History: Past Medical History:  Diagnosis Date  . Allergy   . Anemia    takes Ferrous Sulfate daily  . Arthritis   . GERD (gastroesophageal reflux disease)    takes OTC med  . Hyperlipidemia   . Hypertension    takes Amlodipine daily  . Low back pain    occasionally   Past Surgical History:  Procedure Laterality Date  . ANTERIOR CERVICAL DECOMP/DISCECTOMY FUSION  10/19/2011   Procedure: ANTERIOR CERVICAL DECOMPRESSION/DISCECTOMY FUSION 1 LEVEL/HARDWARE REMOVAL;  Surgeon: Elaina Hoops, MD;  Location: [place];  Service: Neurosurgery;  Laterality: N/A;  Cervical four - five  Anterior cervical decompression fusion with redo at six - seven  Rm 33  . CARPAL TUNNEL RELEASE    . COLONOSCOPY  08/2014   polyps  . COLONOSCOPY WITH PROPOFOL N/A 02/04/2020   Procedure: COLONOSCOPY WITH PROPOFOL;  Surgeon: [name], MD;  Location[facility];  Service: Endoscopy;  Laterality: N/A;  . ESOPHAGOGASTRODUODENOSCOPY (EGD) WITH PROPOFOL N/A 06/23/2015   Procedure: ESOPHAGOGASTRODUODENOSCOPY (EGD) WITH PROPOFOL;  Surgeon: [name], MD;  Location: [facility] ENDOSCOPY;  Service: Endoscopy;   Laterality: N/A;  . NECK SURGERY    . POSTERIOR CERVICAL FUSION/FORAMINOTOMY N/A 08/26/2013   Procedure: C/4-5,C/5-6,C/6-7 Posterior Cervical Fusion w/lateral mass fixation;  Surgeon: Elaina Hoops, MD;  Location: [place] [facility];  Service: Neurosurgery;  Laterality: N/A;  . TUBAL LIGATION     Family History  Problem Relation Age of Onset  . Hypertension Mother   . Heart disease Mother   . Kidney failure Sister   . Breast cancer Neg Hx    Social History   Socioeconomic History  . Marital status: Single    Spouse name: Not on file  . Number of children: 3  . Years of education: Not on file  . Highest education level: Some college, no degree  Occupational History  . Occupation: disable  Tobacco Use  . Smoking status: Never Smoker  . Smokeless tobacco: Never Used  Vaping Use  . Vaping Use: Never used  Substance and Sexual Activity  . Alcohol use: No  . Drug use: No  . Sexual activity: Not on file  Other Topics Concern  . Not on file  Social History Narrative  . Not on file   Social Determinants of Health   Financial Resource Strain: Low Risk   . Difficulty of Paying Living Expenses: Not hard at all  Food Insecurity: No Food Insecurity  . Worried About Charity fundraiser in the Last Year: Never true  . Ran Out of Food in the Last Year: Never true  Transportation Needs: No Transportation Needs  . Lack of Transportation (Medical): No  . Lack of Transportation (Non-Medical): No  Physical Activity: Inactive  . Days of Exercise per Week: 0 days  . Minutes of Exercise per Session: 0 min  Stress: Stress Concern Present  . Feeling of Stress : To some extent  Social Connections: Moderately Isolated  . Frequency of Communication with Friends and Family: More than three times a week  . Frequency of Social Gatherings with Friends and Family: More than three times a week  . Attends Religious Services: More than 4 times per year  . Active Member of Clubs or Organizations: No  .  Attends Archivist Meetings: Never  . Marital Status: Never married    Tobacco Counseling Counseling given: Not Answered   Clinical Intake:  Pre-visit preparation completed: Yes  Pain : No/denies pain Pain Score: 0-No pain     Nutritional Status: BMI 25 -29 Overweight Nutritional Risks: None Diabetes: No  How often do you need to have someone help you when you read instructions, pamphlets, or other written materials from your doctor or pharmacy?: 1 - Never  Diabetic? No  Interpreter Needed?: No  Information entered by :: [facility], LPN   Activities of Daily Living In your present state of health, do you have any difficulty performing the following activities: 03/19/2020 03/05/2020  Hearing? N N  Vision? N N  Comment Wears eye glasses. -  Difficulty concentrating or making decisions? N N  Walking or climbing stairs? Y N  Comment Due to tiredness and SOB. -  Dressing or bathing? N N  Doing errands, shopping? N N  Preparing Food and eating ? N -  Using the Toilet? N -  In the past six months, have you accidently leaked urine? N -  Do you have problems with loss of bowel control? N -  Managing your Medications? N -  Managing your Finances? N -  Housekeeping or managing your Housekeeping? N -  Some recent data might be hidden    Patient Care Team: Margaretann Loveless, PA-C as PCP - General (Family Medicine) Midge Minium, MD as Consulting Physician (Gastroenterology) Donalee Citrin, MD as Consulting Physician (Neurosurgery)  Indicate any recent Medical Services you may have received from other than [facility] providers in the past year (date may be approximate).     Assessment:   This is a routine wellness examination for [name].  Hearing/Vision screen No exam data present  Dietary issues and exercise activities discussed: Current Exercise Habits: The patient does not participate in regular exercise at present, Exercise limited by: None identified  Goals     . Exercise 3x per week (30 min per time)     Recommend to walk 3 days a week for at least 30 minutes at a time.     . Increase water intake     Recommend increasing water intake to 4 glasses a day.      Depression Screen PHQ 2/9 Scores 03/19/2020 03/05/2020 01/02/2019 12/25/2017 12/07/2016 12/07/2016 09/08/2015  PHQ - 2 Score 0 0 4 0 0 0 1  PHQ- 9 Score - - 6 - 0 - -    Fall Risk Fall Risk  03/19/2020 01/02/2019 12/25/2017 12/07/2016 09/08/2015  Falls in the past year? 0 0 No No No  Number falls in past yr: 0 - - - -  Injury with Fall? 0 - - - -    Any stairs in or around the home? Yes  If so, are there any without handrails? No  Home free of loose throw rugs in walkways, pet beds, electrical cords, etc? Yes  Adequate lighting in your home to reduce risk of falls? Yes   ASSISTIVE DEVICES UTILIZED TO PREVENT FALLS:  Life alert? No  Use of a cane, walker or w/c? No  Grab bars in the bathroom? No  Shower chair or bench in shower? No  Elevated toilet seat or a handicapped toilet? No   TIMED UP AND GO:  Was the test performed? Yes .  Length of time to ambulate 10 feet: 8 sec.   Gait steady and fast without use of assistive device  Cognitive Function:     6CIT Screen 03/19/2020 12/07/2016  What Year? 0 points 0 points  What month? 0 points 0 points  What time? 0 points 0 points  Count back from 20 0 points 0 points  Months in reverse 2 points 0 points  Repeat phrase 0 points 0 points  Total Score 2 0    Immunizations Immunization History  Administered Date(s) Administered  . Influenza-Unspecified 03/07/2017, 06/14/2018  . MMR 09/09/1987  . Tdap 09/30/2008, 01/02/2019    TDAP status: Up to date Flu Vaccine status: Currently due, declined at this time. Covid-19 vaccine status: Declined, Education has been provided regarding the importance of this vaccine but patient still declined. Advised may receive this vaccine  at local pharmacy or Health Dept.or vaccine clinic. Aware to  provide a copy of the vaccination record if obtained from local pharmacy or Health Dept. Verbalized acceptance and understanding.  Qualifies for Shingles Vaccine? Yes   Zostavax completed No   Shingrix Completed?: No.    Education has been provided regarding the importance of this vaccine. Patient has been advised to call insurance company to determine out of pocket expense if they have not yet received this vaccine. Advised may also receive vaccine at local pharmacy or Health Dept. Verbalized acceptance and understanding.  Screening Tests Health Maintenance  Topic Date Due  . PAP SMEAR-Modifier  12/10/2019  . INFLUENZA VACCINE  10/22/2020 (Originally 02/23/2020)  . MAMMOGRAM  01/20/2021  . COLONOSCOPY  02/04/2027  . TETANUS/TDAP  01/01/2029  . Hepatitis C Screening  Completed  . HIV Screening  Completed    Health Maintenance  Health Maintenance Due  Topic Date Due  . PAP SMEAR-Modifier  12/10/2019    Colorectal cancer screening: Completed 02/04/20. Repeat every 7 years Mammogram status: Completed 01/21/19. Repeat every year. Pt to call [facility] to schedule apt.  Lung Cancer Screening: (Low Dose CT Chest recommended if Age 26-80 years, 30 pack-year currently smoking OR have quit w/in 15years.) does not qualify.   Additional Screening:  Hepatitis C Screening: Up to date  Vision Screening: Recommended annual ophthalmology exams for early detection of glaucoma and other disorders of the eye. Is the patient up to date with their annual eye exam? No, pt is currently in the process of finding a new ophthalmologist.  Who is the provider or what is the name of the office in which the patient attends annual eye exams? N/A If pt is not established with a provider, would they like to be referred to a provider to establish care? No .   Dental Screening: Recommended annual dental exams for proper oral hygiene  Community Resource Referral / Chronic Care Management: CRR required  this visit?  No   CCM required this visit?  No      Plan:     I have personally reviewed and noted the following in the patient's chart:   . Medical and social history . Use of alcohol, tobacco or illicit drugs  . Current medications and supplements . Functional ability and status . Nutritional status . Physical activity . Advanced directives . List of other physicians . Hospitalizations, surgeries, and ER visits in previous 12 months . Vitals . Screenings to include cognitive, depression, and falls . Referrals and appointments  In addition, I have reviewed and discussed with patient certain preventive protocols, quality metrics, and best practice recommendations. A written personalized care plan for preventive services as well as general preventive health recommendations were provided to patient.     [name], [place]   7/82/4235   Nurse Notes: Pt would like to have a PAP smear completed at today's apt. Declined a flu shot at this time.

## 2020-03-19 ENCOUNTER — Other Ambulatory Visit: Payer: Self-pay

## 2020-03-19 ENCOUNTER — Encounter: Payer: Self-pay | Admitting: Physician Assistant

## 2020-03-19 ENCOUNTER — Ambulatory Visit (INDEPENDENT_AMBULATORY_CARE_PROVIDER_SITE_OTHER): Payer: Medicare Other | Admitting: Physician Assistant

## 2020-03-19 ENCOUNTER — Ambulatory Visit (INDEPENDENT_AMBULATORY_CARE_PROVIDER_SITE_OTHER): Payer: Self-pay

## 2020-03-19 ENCOUNTER — Other Ambulatory Visit: Payer: Self-pay | Admitting: Physician Assistant

## 2020-03-19 VITALS — BP 136/68 | HR 83 | Temp 98.2°F | Ht 65.0 in | Wt 175.0 lb

## 2020-03-19 VITALS — BP 136/68 | HR 83 | Temp 98.2°F | Ht 65.0 in | Wt 175.4 lb

## 2020-03-19 DIAGNOSIS — K2100 Gastro-esophageal reflux disease with esophagitis, without bleeding: Secondary | ICD-10-CM

## 2020-03-19 DIAGNOSIS — Z1239 Encounter for other screening for malignant neoplasm of breast: Secondary | ICD-10-CM | POA: Diagnosis not present

## 2020-03-19 DIAGNOSIS — D508 Other iron deficiency anemias: Secondary | ICD-10-CM

## 2020-03-19 DIAGNOSIS — E7800 Pure hypercholesterolemia, unspecified: Secondary | ICD-10-CM

## 2020-03-19 DIAGNOSIS — I10 Essential (primary) hypertension: Secondary | ICD-10-CM | POA: Diagnosis not present

## 2020-03-19 DIAGNOSIS — E559 Vitamin D deficiency, unspecified: Secondary | ICD-10-CM

## 2020-03-19 DIAGNOSIS — Z0000 Encounter for general adult medical examination without abnormal findings: Secondary | ICD-10-CM | POA: Diagnosis not present

## 2020-03-19 DIAGNOSIS — K219 Gastro-esophageal reflux disease without esophagitis: Secondary | ICD-10-CM

## 2020-03-19 DIAGNOSIS — Z8673 Personal history of transient ischemic attack (TIA), and cerebral infarction without residual deficits: Secondary | ICD-10-CM

## 2020-03-19 MED ORDER — DEXILANT 60 MG PO CPDR: 60.0000 mg | DELAYED_RELEASE_CAPSULE | Freq: Every day | ORAL | 1 refills | Status: DC

## 2020-03-19 MED ORDER — CLOPIDOGREL BISULFATE 75 MG PO TABS: 75.0000 mg | ORAL_TABLET | Freq: Every day | ORAL | 1 refills | Status: DC

## 2020-03-19 MED ORDER — ATORVASTATIN CALCIUM 40 MG PO TABS: 40.0000 mg | ORAL_TABLET | Freq: Every day | ORAL | 1 refills | Status: DC

## 2020-03-19 MED ORDER — AMLODIPINE BESYLATE 10 MG PO TABS: 10.0000 mg | ORAL_TABLET | Freq: Every day | ORAL | 1 refills | Status: DC

## 2020-03-19 MED ORDER — SUCRALFATE 1 G PO TABS: ORAL_TABLET | ORAL | 0 refills | Status: DC

## 2020-03-19 NOTE — Patient Instructions (Signed)
[name] , Thank you for taking time to come for your Medicare Wellness Visit. I appreciate your ongoing commitment to your health goals. Please review the following plan we discussed and let me know if I can assist you in the future.   Screening recommendations/referrals: Colonoscopy: Up to date, due 01/2027 Mammogram: Currently due, pt plans to call [facility] and schedule apt.  Recommended yearly ophthalmology/optometry visit for glaucoma screening and checkup Recommended yearly dental visit for hygiene and checkup  Vaccinations: Influenza vaccine: Currently due, declined at this time. Tdap vaccine: Up to date, due 12/2028 Shingles vaccine: Shingrix discussed. Please contact your pharmacy for coverage information.     Advanced directives: Please bring a copy of your POA (Power of Attorney) and/or Living Will to your next appointment.   Conditions/risks identified: Recommend to walk 3 days a week for at least 30 minutes at a time. Also recommend to increase water intake to 6-8 8 oz glasses a day.   Next appointment: 10:20 AM with [place] 65 Years and Older, Female Preventive care refers to lifestyle choices and visits with your health care provider that can promote health and wellness. What does preventive care include?  A yearly physical exam. This is also called an annual well check.  Dental exams once or twice a year.  Routine eye exams. Ask your health care provider how often you should have your eyes checked.  Personal lifestyle choices, including:  Daily care of your teeth and gums.  Regular physical activity.  Eating a healthy diet.  Avoiding tobacco and drug use.  Limiting alcohol use.  Practicing safe sex.  Taking low-dose aspirin every day.  Taking vitamin and mineral supplements as recommended by your health care provider. What happens during an annual well check? The services and screenings done by your health care provider  during your annual well check will depend on your age, overall health, lifestyle risk factors, and family history of disease. Counseling  Your health care provider may ask you questions about your:  Alcohol use.  Tobacco use.  Drug use.  Emotional well-being.  Home and relationship well-being.  Sexual activity.  Eating habits.  History of falls.  Memory and ability to understand (cognition).  Work and work Statistician.  Reproductive health. Screening  You may have the following tests or measurements:  Height, weight, and BMI.  Blood pressure.  Lipid and cholesterol levels. These may be checked every 5 years, or more frequently if you are over 58 years old.  Skin check.  Lung cancer screening. You may have this screening every year starting at age 66 if you have a 30-pack-year history of smoking and currently smoke or have quit within the past 15 years.  Fecal occult blood test (FOBT) of the stool. You may have this test every year starting at age 83.  Flexible sigmoidoscopy or colonoscopy. You may have a sigmoidoscopy every 5 years or a colonoscopy every 10 years starting at age 97.  Hepatitis C blood test.  Hepatitis B blood test.  Sexually transmitted disease (STD) testing.  Diabetes screening. This is done by checking your blood sugar (glucose) after you have not eaten for a while (fasting). You may have this done every 1-3 years.  Bone density scan. This is done to screen for osteoporosis. You may have this done starting at age 68.  Mammogram. This may be done every 1-2 years. Talk to your health care provider about how often you should have regular  mammograms. Talk with your health care provider about your test results, treatment options, and if necessary, the need for more tests. Vaccines  Your health care provider may recommend certain vaccines, such as:  Influenza vaccine. This is recommended every year.  Tetanus, diphtheria, and acellular pertussis  (Tdap, Td) vaccine. You may need a Td booster every 10 years.  Zoster vaccine. You may need this after age 15.  Pneumococcal 13-valent conjugate (PCV13) vaccine. One dose is recommended after age 2.  Pneumococcal polysaccharide (PPSV23) vaccine. One dose is recommended after age 37. Talk to your health care provider about which screenings and vaccines you need and how often you need them. This information is not intended to replace advice given to you by your health care provider. Make sure you discuss any questions you have with your health care provider. Document Released: 08/07/2015 Document Revised: 03/30/2016 Document Reviewed: 05/12/2015 Elsevier Interactive Patient Education  2017 [place] Prevention in the Home Falls can cause injuries. They can happen to people of all ages. There are many things you can do to make your home safe and to help prevent falls. What can I do on the outside of my home?  Regularly fix the edges of walkways and driveways and fix any cracks.  Remove anything that might make you trip as you walk through a door, such as a raised step or threshold.  Trim any bushes or trees on the path to your home.  Use bright outdoor lighting.  Clear any walking paths of anything that might make someone trip, such as rocks or tools.  Regularly check to see if handrails are loose or broken. Make sure that both sides of any steps have handrails.  Any raised decks and porches should have guardrails on the edges.  Have any leaves, snow, or ice cleared regularly.  Use sand or salt on walking paths during winter.  Clean up any spills in your garage right away. This includes oil or grease spills. What can I do in the bathroom?  Use night lights.  Install grab bars by the toilet and in the tub and shower. Do not use towel bars as grab bars.  Use non-skid mats or decals in the tub or shower.  If you need to sit down in the shower, use a plastic, non-slip  stool.  Keep the floor dry. Clean up any water that spills on the floor as soon as it happens.  Remove soap buildup in the tub or shower regularly.  Attach bath mats securely with double-sided non-slip rug tape.  Do not have throw rugs and other things on the floor that can make you trip. What can I do in the bedroom?  Use night lights.  Make sure that you have a light by your bed that is easy to reach.  Do not use any sheets or blankets that are too big for your bed. They should not hang down onto the floor.  Have a firm chair that has side arms. You can use this for support while you get dressed.  Do not have throw rugs and other things on the floor that can make you trip. What can I do in the kitchen?  Clean up any spills right away.  Avoid walking on wet floors.  Keep items that you use a lot in easy-to-reach places.  If you need to reach something above you, use a strong step stool that has a grab bar.  Keep electrical cords out of the way.  Do not use floor polish or wax that makes floors slippery. If you must use wax, use non-skid floor wax.  Do not have throw rugs and other things on the floor that can make you trip. What can I do with my stairs?  Do not leave any items on the stairs.  Make sure that there are handrails on both sides of the stairs and use them. Fix handrails that are broken or loose. Make sure that handrails are as long as the stairways.  Check any carpeting to make sure that it is firmly attached to the stairs. Fix any carpet that is loose or worn.  Avoid having throw rugs at the top or bottom of the stairs. If you do have throw rugs, attach them to the floor with carpet tape.  Make sure that you have a light switch at the top of the stairs and the bottom of the stairs. If you do not have them, ask someone to add them for you. What else can I do to help prevent falls?  Wear shoes that:  Do not have high heels.  Have rubber bottoms.  Are  comfortable and fit you well.  Are closed at the toe. Do not wear sandals.  If you use a stepladder:  Make sure that it is fully opened. Do not climb a closed stepladder.  Make sure that both sides of the stepladder are locked into place.  Ask someone to hold it for you, if possible.  Clearly mark and make sure that you can see:  Any grab bars or handrails.  First and last steps.  Where the edge of each step is.  Use tools that help you move around (mobility aids) if they are needed. These include:  Canes.  Walkers.  Scooters.  Crutches.  Turn on the lights when you go into a dark area. Replace any light bulbs as soon as they burn out.  Set up your furniture so you have a clear path. Avoid moving your furniture around.  If any of your floors are uneven, fix them.  If there are any pets around you, be aware of where they are.  Review your medicines with your doctor. Some medicines can make you feel dizzy. This can increase your chance of falling. Ask your doctor what other things that you can do to help prevent falls. This information is not intended to replace advice given to you by your health care provider. Make sure you discuss any questions you have with your health care provider. Document Released: 05/07/2009 Document Revised: 12/17/2015 Document Reviewed: 08/15/2014 Elsevier Interactive Patient Education  2017 [employer].

## 2020-03-19 NOTE — Telephone Encounter (Signed)
Patient is requesting Rx refills a little soon- last filled 01/02/20. Patient has appointment today- sent for PCP review before denying for too soon

## 2020-03-19 NOTE — Patient Instructions (Signed)
[facility],  [place]  [address] Main: [phone]   Health Maintenance for Postmenopausal Women Menopause is a normal process in which your ability to get pregnant comes to an end. This process happens slowly over many months or years, usually between the ages of 49 and 7. Menopause is complete when you have missed your menstrual periods for 12 months. It is important to talk with your health care provider about some of the most common conditions that affect women after menopause (postmenopausal women). These include heart disease, cancer, and bone loss (osteoporosis). Adopting a healthy lifestyle and getting preventive care can help to promote your health and wellness. The actions you take can also lower your chances of developing some of these common conditions. What should I know about menopause? During menopause, you may get a number of symptoms, such as:  Hot flashes. These can be moderate or severe.  Night sweats.  Decrease in sex drive.  Mood swings.  Headaches.  Tiredness.  Irritability.  Memory problems.  Insomnia. Choosing to treat or not to treat these symptoms is a decision that you make with your health care provider. Do I need hormone replacement therapy?  Hormone replacement therapy is effective in treating symptoms that are caused by menopause, such as hot flashes and night sweats.  Hormone replacement carries certain risks, especially as you become older. If you are thinking about using estrogen or estrogen with progestin, discuss the benefits and risks with your health care provider. What is my risk for heart disease and stroke? The risk of heart disease, heart attack, and stroke increases as you age. One of the causes may be a change in the body's hormones during menopause. This can affect how your body uses dietary fats, triglycerides, and cholesterol. Heart attack and stroke are medical  emergencies. There are many things that you can do to help prevent heart disease and stroke. Watch your blood pressure  High blood pressure causes heart disease and increases the risk of stroke. This is more likely to develop in people who have high blood pressure readings, are of African descent, or are overweight.  Have your blood pressure checked: ? Every 3-5 years if you are 5-74 years of age. ? Every year if you are 31 years old or older. Eat a healthy diet   Eat a diet that includes plenty of vegetables, fruits, low-fat dairy products, and lean protein.  Do not eat a lot of foods that are high in solid fats, added sugars, or sodium. Get regular exercise Get regular exercise. This is one of the most important things you can do for your health. Most adults should:  Try to exercise for at least 150 minutes each week. The exercise should increase your heart rate and make you sweat (moderate-intensity exercise).  Try to do strengthening exercises at least twice each week. Do these in addition to the moderate-intensity exercise.  Spend less time sitting. Even light physical activity can be beneficial. Other tips  Work with your health care provider to achieve or maintain a healthy weight.  Do not use any products that contain nicotine or tobacco, such as cigarettes, e-cigarettes, and chewing tobacco. If you need help quitting, ask your health care provider.  Know your numbers. Ask your health care provider to check your cholesterol and your blood sugar (glucose). Continue to have your blood tested as directed by your health care provider. Do I need screening for cancer? Depending on  your health history and family history, you may need to have cancer screening at different stages of your life. This may include screening for:  Breast cancer.  Cervical cancer.  Lung cancer.  Colorectal cancer. What is my risk for osteoporosis? After menopause, you may be at increased risk for  osteoporosis. Osteoporosis is a condition in which bone destruction happens more quickly than new bone creation. To help prevent osteoporosis or the bone fractures that can happen because of osteoporosis, you may take the following actions:  If you are 77-62 years old, get at least 1,000 mg of calcium and at least 600 mg of vitamin D per day.  If you are older than age 74 but younger than age 67, get at least 1,200 mg of calcium and at least 600 mg of vitamin D per day.  If you are older than age 78, get at least 1,200 mg of calcium and at least 800 mg of vitamin D per day. Smoking and drinking excessive alcohol increase the risk of osteoporosis. Eat foods that are rich in calcium and vitamin D, and do weight-bearing exercises several times each week as directed by your health care provider. How does menopause affect my mental health? Depression may occur at any age, but it is more common as you become older. Common symptoms of depression include:  Low or sad mood.  Changes in sleep patterns.  Changes in appetite or eating patterns.  Feeling an overall lack of motivation or enjoyment of activities that you previously enjoyed.  Frequent crying spells. Talk with your health care provider if you think that you are experiencing depression. General instructions See your health care provider for regular wellness exams and vaccines. This may include:  Scheduling regular health, dental, and eye exams.  Getting and maintaining your vaccines. These include: ? Influenza vaccine. Get this vaccine each year before the flu season begins. ? Pneumonia vaccine. ? Shingles vaccine. ? Tetanus, diphtheria, and pertussis (Tdap) booster vaccine. Your health care provider may also recommend other immunizations. Tell your health care provider if you have ever been abused or do not feel safe at home. Summary  Menopause is a normal process in which your ability to get pregnant comes to an end.  This  condition causes hot flashes, night sweats, decreased interest in sex, mood swings, headaches, or lack of sleep.  Treatment for this condition may include hormone replacement therapy.  Take actions to keep yourself healthy, including exercising regularly, eating a healthy diet, watching your weight, and checking your blood pressure and blood sugar levels.  Get screened for cancer and depression. Make sure that you are up to date with all your vaccines. This information is not intended to replace advice given to you by your health care provider. Make sure you discuss any questions you have with your health care provider. Document Revised: 07/04/2018 Document Reviewed: 07/04/2018 Elsevier Patient Education  2020 [employer].

## 2020-03-20 ENCOUNTER — Telehealth: Payer: Self-pay

## 2020-03-20 DIAGNOSIS — E7800 Pure hypercholesterolemia, unspecified: Secondary | ICD-10-CM

## 2020-03-20 LAB — COMPREHENSIVE METABOLIC PANEL
ALT: 8 IU/L (ref 0–32)
AST: 13 IU/L (ref 0–40)
Albumin/Globulin Ratio: 1.5 (ref 1.2–2.2)
Albumin: 4.1 g/dL (ref 3.8–4.8)
Alkaline Phosphatase: 109 IU/L (ref 48–121)
BUN/Creatinine Ratio: 11 — ABNORMAL LOW (ref 12–28)
BUN: 8 mg/dL (ref 8–27)
Bilirubin Total: 0.3 mg/dL (ref 0.0–1.2)
CO2: 26 mmol/L (ref 20–29)
Calcium: 9.4 mg/dL (ref 8.7–10.3)
Chloride: 102 mmol/L (ref 96–106)
Creatinine, Ser: 0.72 mg/dL (ref 0.57–1.00)
GFR calc Af Amer: 103 mL/min/{1.73_m2} (ref >59)
GFR calc non Af Amer: 89 mL/min/{1.73_m2} (ref >59)
Globulin, Total: 2.8 g/dL (ref 1.5–4.5)
Glucose: 92 mg/dL (ref 65–99)
Potassium: 3.7 mmol/L (ref 3.5–5.2)
Sodium: 141 mmol/L (ref 134–144)
Total Protein: 6.9 g/dL (ref 6.0–8.5)

## 2020-03-20 LAB — CBC WITH DIFFERENTIAL/PLATELET
Basophils Absolute: 0 10*3/uL (ref 0.0–0.2)
Basos: 1 %
EOS (ABSOLUTE): 0.1 10*3/uL (ref 0.0–0.4)
Eos: 1 %
Hematocrit: 42 % (ref 34.0–46.6)
Hemoglobin: 13.8 g/dL (ref 11.1–15.9)
Immature Grans (Abs): 0 10*3/uL (ref 0.0–0.1)
Immature Granulocytes: 0 %
Lymphocytes Absolute: 2.9 10*3/uL (ref 0.7–3.1)
Lymphs: 38 %
MCH: 27.2 pg (ref 26.6–33.0)
MCHC: 32.9 g/dL (ref 31.5–35.7)
MCV: 83 fL (ref 79–97)
Monocytes Absolute: 0.4 10*3/uL (ref 0.1–0.9)
Monocytes: 6 %
Neutrophils Absolute: 4.2 10*3/uL (ref 1.4–7.0)
Neutrophils: 54 %
Platelets: 316 10*3/uL (ref 150–450)
RBC: 5.07 x10E6/uL (ref 3.77–5.28)
RDW: 13.3 % (ref 11.7–15.4)
WBC: 7.6 10*3/uL (ref 3.4–10.8)

## 2020-03-20 LAB — HEMOGLOBIN A1C
Est. average glucose Bld gHb Est-mCnc: 123 mg/dL
Hgb A1c MFr Bld: 5.9 % — ABNORMAL HIGH (ref 4.8–5.6)

## 2020-03-20 LAB — TSH: TSH: 0.369 u[IU]/mL — ABNORMAL LOW (ref 0.450–4.500)

## 2020-03-20 LAB — IRON,TIBC AND FERRITIN PANEL
Ferritin: 522 ng/mL — ABNORMAL HIGH (ref 15–150)
Iron Saturation: 22 % (ref 15–55)
Iron: 61 ug/dL (ref 27–139)
Total Iron Binding Capacity: 275 ug/dL (ref 250–450)
UIBC: 214 ug/dL (ref 118–369)

## 2020-03-20 LAB — VITAMIN D 25 HYDROXY (VIT D DEFICIENCY, FRACTURES): Vit D, 25-Hydroxy: 43.7 ng/mL (ref 30.0–100.0)

## 2020-03-20 LAB — LIPID PANEL
Chol/HDL Ratio: 3.8 ratio (ref 0.0–4.4)
Cholesterol, Total: 153 mg/dL (ref 100–199)
HDL: 40 mg/dL (ref >39)
LDL Chol Calc (NIH): 74 mg/dL (ref 0–99)
Triglycerides: 237 mg/dL — ABNORMAL HIGH (ref 0–149)
VLDL Cholesterol Cal: 39 mg/dL (ref 5–40)

## 2020-03-20 NOTE — Telephone Encounter (Signed)
Patient was advised and reports she will return in 6 weeks to have TSH done.

## 2020-03-20 NOTE — Telephone Encounter (Signed)
-----   Message from Mar Daring, [place] sent at 03/20/2020  9:01 AM EDT ----- Blood count is normal. Kidney and liver function are normal. Sodium, potassium, and calcium are normal. Cholesterol is normal. Thyroid is borderline hyperactive. Would recommend to recheck in 6 weeks. A1c has improved from 6.1 to 5.9. Vit D is normal. Iron levels are normal.

## 2020-03-31 ENCOUNTER — Other Ambulatory Visit: Payer: Self-pay | Admitting: Physician Assistant

## 2020-03-31 DIAGNOSIS — Z1231 Encounter for screening mammogram for malignant neoplasm of breast: Secondary | ICD-10-CM

## 2020-04-08 ENCOUNTER — Other Ambulatory Visit: Payer: Self-pay

## 2020-04-08 ENCOUNTER — Ambulatory Visit
Admission: RE | Admit: 2020-04-08 | Discharge: 2020-04-08 | Disposition: A | Discharge status: Home / Self Care | Payer: Medicare Other | Source: Ambulatory Visit | Attending: Physician Assistant | Admitting: Physician Assistant

## 2020-04-08 DIAGNOSIS — Z1231 Encounter for screening mammogram for malignant neoplasm of breast: Secondary | ICD-10-CM | POA: Insufficient documentation

## 2020-04-09 ENCOUNTER — Telehealth: Payer: Self-pay

## 2020-04-09 NOTE — Telephone Encounter (Signed)
Written by Mar Daring, PA-C on 04/09/2020 3:35 PM EDT Seen by patient [name] on 04/09/2020 4:07 PM

## 2020-04-09 NOTE — Telephone Encounter (Signed)
-----   Message from Mar Daring, [place] sent at 04/09/2020  3:35 PM EDT ----- Normal mammogram. Repeat screening in one year.

## 2020-05-08 ENCOUNTER — Encounter: Payer: Self-pay | Admitting: Physician Assistant

## 2020-05-08 DIAGNOSIS — R7303 Prediabetes: Secondary | ICD-10-CM | POA: Insufficient documentation

## 2020-05-15 ENCOUNTER — Ambulatory Visit: Payer: Medicare Other | Admitting: Physician Assistant

## 2020-05-15 ENCOUNTER — Telehealth: Payer: Self-pay

## 2020-05-15 DIAGNOSIS — R7989 Other specified abnormal findings of blood chemistry: Secondary | ICD-10-CM

## 2020-05-19 ENCOUNTER — Telehealth: Payer: Self-pay

## 2020-05-19 LAB — TSH+FREE T4
Free T4: 1.03 ng/dL (ref 0.82–1.77)
TSH: 1.36 u[IU]/mL (ref 0.450–4.500)

## 2020-05-19 NOTE — Telephone Encounter (Signed)
Written by Virginia Crews, MD on 05/19/2020 11:26 AM EDT Seen by patient [name] on 05/19/2020 1:48 PM

## 2020-05-19 NOTE — Telephone Encounter (Signed)
-----   Message from Virginia Crews, MD sent at 05/19/2020 11:26 AM EDT ----- Normal labs

## 2020-06-03 NOTE — Telephone Encounter (Signed)
error 

## 2020-06-07 ENCOUNTER — Other Ambulatory Visit: Payer: Self-pay | Admitting: Physician Assistant

## 2020-06-07 NOTE — Telephone Encounter (Signed)
Requested medications are due for refill today?  Yes - This medication refill cannot be delegated.    Requested medications are on active medication list? Yes  Last Refill:   03/12/2020  # 90 with no refills   Future visit scheduled?  Yes  Notes to Clinic:  This medication refill cannot be delegated.

## 2020-06-08 ENCOUNTER — Other Ambulatory Visit: Payer: Self-pay | Admitting: Physician Assistant

## 2020-06-13 ENCOUNTER — Other Ambulatory Visit: Payer: Self-pay | Admitting: Physician Assistant

## 2020-06-13 DIAGNOSIS — K2100 Gastro-esophageal reflux disease with esophagitis, without bleeding: Secondary | ICD-10-CM

## 2020-08-18 ENCOUNTER — Other Ambulatory Visit: Payer: Self-pay

## 2020-08-18 ENCOUNTER — Ambulatory Visit (INDEPENDENT_AMBULATORY_CARE_PROVIDER_SITE_OTHER): Payer: Medicare Other | Admitting: Family Medicine

## 2020-08-18 ENCOUNTER — Encounter: Payer: Self-pay | Admitting: Family Medicine

## 2020-08-18 VITALS — BP 128/74 | HR 72 | Temp 98.3°F | Ht 65.0 in | Wt 170.8 lb

## 2020-08-18 DIAGNOSIS — M545 Low back pain, unspecified: Secondary | ICD-10-CM | POA: Diagnosis not present

## 2020-08-18 MED ORDER — CYCLOBENZAPRINE HCL 10 MG PO TABS: 10.0000 mg | ORAL_TABLET | Freq: Every day | ORAL | 0 refills | Status: DC

## 2020-08-18 NOTE — Progress Notes (Signed)
Acute Office Visit  Subjective:    Patient ID: [name], female    DOB: Aug 11, 1955, 65 y.o.   MRN: [id number]  Chief Complaint  Patient presents with   Back Pain    HPI Patient is in today for lower and mid level back pain that has been going on since 08/14/2020. Pt denies any trauma to these areas.   Past Medical History:  Diagnosis Date   Allergy    Anemia    takes Ferrous Sulfate daily   Arthritis    GERD (gastroesophageal reflux disease)    takes OTC med   Hyperlipidemia    Hypertension    takes Amlodipine daily   Low back pain    occasionally    Past Surgical History:  Procedure Laterality Date   ANTERIOR CERVICAL DECOMP/DISCECTOMY FUSION  10/19/2011   Procedure: ANTERIOR CERVICAL DECOMPRESSION/DISCECTOMY FUSION 1 LEVEL/HARDWARE REMOVAL;  Surgeon: Elaina Hoops, MD;  Location: [place] [facility];  Service: Neurosurgery;  Laterality: N/A;  Cervical four - five  Anterior cervical decompression fusion with redo at six - seven  Rm 33   CARPAL TUNNEL RELEASE     COLONOSCOPY  08/2014   polyps   COLONOSCOPY WITH PROPOFOL N/A 02/04/2020   Procedure: COLONOSCOPY WITH PROPOFOL;  Surgeon: [name], MD;  Location: [facility];  Service: Endoscopy;  Laterality: N/A;   ESOPHAGOGASTRODUODENOSCOPY (EGD) WITH PROPOFOL N/A 06/23/2015   Procedure: ESOPHAGOGASTRODUODENOSCOPY (EGD) WITH PROPOFOL;  Surgeon: [name], MD;  Location: [facility] END[facility];  Service: Endoscopy;  Laterality: N/A;   NECK SURGERY     POSTERIOR CERVICAL FUSION/FORAMINOTOMY N/A 08/26/2013   Procedure: C/4-5,C/5-6,C/6-7 Posterior Cervical Fusion w/lateral mass fixation;  Surgeon: Elaina Hoops, MD;  Location: [place] [facility];  Service: Neurosurgery;  Laterality: N/A;   TUBAL LIGATION      Family History  Problem Relation Age of Onset   Hypertension Mother    Heart disease Mother    Kidney failure Sister    Breast cancer Neg Hx     Social History   Socioeconomic History   Marital status: Single    Spouse  name: Not on file   Number of children: 3   Years of education: Not on file   Highest education level: Some college, no degree  Occupational History   Occupation: disable  Tobacco Use   Smoking status: Never Smoker   Smokeless tobacco: Never Used  Scientific laboratory technician Use: Never used  Substance and Sexual Activity   Alcohol use: No   Drug use: No   Sexual activity: Not on file  Other Topics Concern   Not on file  Social History Narrative   Not on file   Social Determinants of Health   Financial Resource Strain: Low Risk    Difficulty of Paying Living Expenses: Not hard at all  Food Insecurity: No Food Insecurity   Worried About Charity fundraiser in the Last Year: Never true   [place] in the Last Year: Never true  Transportation Needs: No Transportation Needs   Lack of Transportation (Medical): No   Lack of Transportation (Non-Medical): No  Physical Activity: Inactive   Days of Exercise per Week: 0 days   Minutes of Exercise per Session: 0 min  Stress: Stress Concern Present   Feeling of Stress : To some extent  Social Connections: Moderately Isolated   Frequency of Communication with Friends and Family: More than three times a week   Frequency of Social Gatherings  with Friends and Family: More than three times a week   Attends Religious Services: More than 4 times per year   Active Member of Clubs or Organizations: No   Attends Archivist Meetings: Never   Marital Status: Never married  Human resources officer Violence: Not At Risk   Fear of Current or Ex-Partner: No   Emotionally Abused: No   Physically Abused: No   Sexually Abused: No    Outpatient Medications Prior to Visit  Medication Sig Dispense Refill   acetaminophen (TYLENOL) 500 MG tablet Take 2 tablets (1,000 mg total) by mouth every 6 (six) hours as needed. 90 tablet 1   albuterol (VENTOLIN HFA) 108 (90 Base) MCG/ACT inhaler INHALE 2 PUFFS INTO THE LUNGS EVERY 6 HOURS AS NEEDED FOR WHEEZING  OR SHORTNESS OF BREATH 25.5 g 1   amLODipine (NORVASC) 10 MG tablet Take 1 tablet (10 mg total) by mouth daily. 90 tablet 1   atorvastatin (LIPITOR) 40 MG tablet Take 1 tablet (40 mg total) by mouth daily. 90 tablet 1   azelastine (OPTIVAR) 0.05 % ophthalmic solution Place 1 drop into both eyes 2 (two) times daily. 6 mL 12   Cholecalciferol (VITAMIN D) 2000 UNITS tablet Take 2,000 Units by mouth daily.     clopidogrel (PLAVIX) 75 MG tablet Take 1 tablet (75 mg total) by mouth daily. 90 tablet 1   cyclobenzaprine (FLEXERIL) 5 MG tablet TAKE 1 TABLET(5 MG) BY MOUTH AT BEDTIME 90 tablet 0   dexlansoprazole (DEXILANT) 60 MG capsule Take 1 capsule (60 mg total) by mouth daily. 90 capsule 1   ferrous sulfate 325 (65 FE) MG tablet Take 1 tablet (325 mg total) by mouth daily with breakfast. 90 tablet 1   fluticasone (FLONASE) 50 MCG/ACT nasal spray USE 2 SPRAYS IN EACH NOSTRIL ONCE DAILY 16 g 5   loratadine (CLARITIN) 10 MG tablet Take 1 tablet (10 mg total) by mouth daily. 30 tablet 3   Omega-3 Fatty Acids (FISH OIL) 1200 MG CAPS Take 1 capsule by mouth daily.     potassium chloride (KLOR-CON) 10 MEQ tablet Take 10 mEq by mouth daily. M-F only     sucralfate (CARAFATE) 1 g tablet TAKE 1 TABLET BY MOUTH FOUR TIMES DAILY AT BEDTIME WITH MEALS 360 tablet 2   No facility-administered medications prior to visit.    Allergies  Allergen Reactions   Aspirin Nausea Only   Hydrochlorothiazide     Rash   Ibuprofen Other (See Comments)    Pt says her doctor doesn't want her taking this with the blood thinner she's taking   Lisinopril     Other reaction(s): Unknown   Sulfa Antibiotics Hives and Nausea Only    Review of Systems  Constitutional: Negative.   HENT: Negative.   Respiratory: Negative.   Cardiovascular: Negative.   Gastrointestinal: Negative.   Genitourinary: Negative.   Musculoskeletal: Positive for back pain.  Neurological: Negative for numbness.       Objective:    Physical  Exam Constitutional:      General: She is not in acute distress.    Appearance: She is well-developed and well-nourished.  HENT:     Head: Normocephalic and atraumatic.     Right Ear: Hearing normal.     Left Ear: Hearing normal.     Nose: Nose normal.  Eyes:     General: Lids are normal. No scleral icterus.       Right eye: No discharge.  Left eye: No discharge.     Conjunctiva/sclera: Conjunctivae normal.  Neck:     Comments: Well healed scars anterior right neck base and posterior from cervical fusions. Cardiovascular:     Rate and Rhythm: Normal rate and regular rhythm.     Heart sounds: Normal heart sounds.  Pulmonary:     Effort: Pulmonary effort is normal. No respiratory distress.  Musculoskeletal:        General: Tenderness present. Normal range of motion.     Comments: Tender right lower lumbar musculature.   Skin:    General: Skin is intact.     Findings: No lesion or rash.  Neurological:     Mental Status: She is alert and oriented to person, place, and time.     Deep Tendon Reflexes: Reflexes normal.  Psychiatric:        Mood and Affect: Mood and affect normal.        Speech: Speech normal.        Behavior: Behavior normal.        Thought Content: Thought content normal.     BP 128/74 (BP Location: Left Arm, Patient Position: Sitting, Cuff Size: Large)    Pulse 72    Temp 98.3 F (36.8 C) (Oral)    Ht 5\' 5"  (1.651 m)    Wt 170 lb 12.8 oz (77.5 kg)    SpO2 98%    BMI 28.42 kg/m  Wt Readings from Last 3 Encounters:  03/19/20 175 lb (79.4 kg)  03/19/20 175 lb 6.4 oz (79.6 kg)  02/24/20 152 lb (68.9 kg)    There are no preventive care reminders to display for this patient.  There are no preventive care reminders to display for this patient.   Lab Results  Component Value Date   TSH 1.360 05/18/2020   Lab Results  Component Value Date   WBC 7.6 03/19/2020   HGB 13.8 03/19/2020   HCT 42.0 03/19/2020   MCV 83 03/19/2020   PLT 316 03/19/2020    Lab Results  Component Value Date   NA 141 03/19/2020   K 3.7 03/19/2020   CO2 26 03/19/2020   GLUCOSE 92 03/19/2020   BUN 8 03/19/2020   CREATININE 0.72 03/19/2020   BILITOT 0.3 03/19/2020   ALKPHOS 109 03/19/2020   AST 13 03/19/2020   ALT 8 03/19/2020   PROT 6.9 03/19/2020   ALBUMIN 4.1 03/19/2020   CALCIUM 9.4 03/19/2020   ANIONGAP 10 02/24/2020   Lab Results  Component Value Date   CHOL 153 03/19/2020   Lab Results  Component Value Date   HDL 40 03/19/2020   Lab Results  Component Value Date   LDLCALC 74 03/19/2020   Lab Results  Component Value Date   TRIG 237 (H) 03/19/2020   Lab Results  Component Value Date   CHOLHDL 3.8 03/19/2020   Lab Results  Component Value Date   HGBA1C 5.9 (H) 03/19/2020       Assessment & Plan:   1. Right low back pain, unspecified chronicity, unspecified whether sciatica present Onset 08-14-20 without known specific injury. Suspect low back strain. May apply moist heat or ice packs and given Flexeril for muscle spasms. Recheck prn. May need x-ray evaluation if no better in 4-5 days. - cyclobenzaprine (FLEXERIL) 10 MG tablet; Take 1 tablet (10 mg total) by mouth at bedtime.  Dispense: 90 tablet; Refill: 0    No orders of the defined types were placed in this encounter.    Triston  Freddi Starr, CMA  I, Saburo Luger, PA-C, have reviewed all documentation for this visit. The documentation on 01/19/21 for the exam, diagnosis, procedures, and orders are all accurate and complete.

## 2020-08-18 NOTE — Patient Instructions (Signed)
Acute Back Pain, Adult Acute back pain is sudden and usually short-lived. It is often caused by an injury to the muscles and tissues in the back. The injury may result from:  A muscle or ligament getting overstretched or torn (strained). Ligaments are tissues that connect bones to each other. Lifting something improperly can cause a back strain.  Wear and tear (degeneration) of the spinal disks. Spinal disks are circular tissue that provide cushioning between the bones of the spine (vertebrae).  Twisting motions, such as while playing sports or doing yard work.  A hit to the back.  Arthritis. You may have a physical exam, lab tests, and imaging tests to find the cause of your pain. Acute back pain usually goes away with rest and home care. Follow these instructions at home: Managing pain, stiffness, and swelling  Treatment may include medicines for pain and inflammation that are taken by mouth or applied to the skin, prescription pain medicine, or muscle relaxants. Take over-the-counter and prescription medicines only as told by your health care provider.  Your health care provider may recommend applying ice during the first 24-48 hours after your pain starts. To do this: ? Put ice in a plastic bag. ? Place a towel between your skin and the bag. ? Leave the ice on for 20 minutes, 2-3 times a day.  If directed, apply heat to the affected area as often as told by your health care provider. Use the heat source that your health care provider recommends, such as a moist heat pack or a heating pad. ? Place a towel between your skin and the heat source. ? Leave the heat on for 20-30 minutes. ? Remove the heat if your skin turns bright red. This is especially important if you are unable to feel pain, heat, or cold. You have a greater risk of getting burned. Activity  Do not stay in bed. Staying in bed for more than 1-2 days can delay your recovery.  Sit up and stand up straight. Avoid leaning  forward when you sit or hunching over when you stand. ? If you work at a desk, sit close to it so you do not need to lean over. Keep your chin tucked in. Keep your neck drawn back, and keep your elbows bent at a 90-degree angle (right angle). ? Sit high and close to the steering wheel when you drive. Add lower back (lumbar) support to your car seat, if needed.  Take short walks on even surfaces as soon as you are able. Try to increase the length of time you walk each day.  Do not sit, drive, or stand in one place for more than 30 minutes at a time. Sitting or standing for long periods of time can put stress on your back.  Do not drive or use heavy machinery while taking prescription pain medicine.  Use proper lifting techniques. When you bend and lift, use positions that put less stress on your back: ? Bend your knees. ? Keep the load close to your body. ? Avoid twisting.  Exercise regularly as told by your health care provider. Exercising helps your back heal faster and helps prevent back injuries by keeping muscles strong and flexible.  Work with a physical therapist to make a safe exercise program, as recommended by your health care provider. Do any exercises as told by your physical therapist.   Lifestyle  Maintain a healthy weight. Extra weight puts stress on your back and makes it difficult to have   good posture.  Avoid activities or situations that make you feel anxious or stressed. Stress and anxiety increase muscle tension and can make back pain worse. Learn ways to manage anxiety and stress, such as through exercise. General instructions  Sleep on a firm mattress in a comfortable position. Try lying on your side with your knees slightly bent. If you lie on your back, put a pillow under your knees.  Follow your treatment plan as told by your health care provider. This may include: ? Cognitive or behavioral therapy. ? Acupuncture or massage therapy. ? Meditation or yoga. Contact  a health care provider if:  You have pain that is not relieved with rest or medicine.  You have increasing pain going down into your legs or buttocks.  Your pain does not improve after 2 weeks.  You have pain at night.  You lose weight without trying.  You have a fever or chills. Get help right away if:  You develop new bowel or bladder control problems.  You have unusual weakness or numbness in your arms or legs.  You develop nausea or vomiting.  You develop abdominal pain.  You feel faint. Summary  Acute back pain is sudden and usually short-lived.  Use proper lifting techniques. When you bend and lift, use positions that put less stress on your back.  Take over-the-counter and prescription medicines and apply heat or ice as directed by your health care provider. This information is not intended to replace advice given to you by your health care provider. Make sure you discuss any questions you have with your health care provider. Document Revised: 04/03/2020 Document Reviewed: 04/03/2020 Elsevier Patient Education  2021 [employer]  

## 2020-09-03 ENCOUNTER — Other Ambulatory Visit: Payer: Self-pay | Admitting: Physician Assistant

## 2020-09-03 DIAGNOSIS — E7800 Pure hypercholesterolemia, unspecified: Secondary | ICD-10-CM

## 2020-09-03 DIAGNOSIS — Z8673 Personal history of transient ischemic attack (TIA), and cerebral infarction without residual deficits: Secondary | ICD-10-CM

## 2020-09-08 ENCOUNTER — Other Ambulatory Visit: Payer: Self-pay | Admitting: Physician Assistant

## 2020-09-08 DIAGNOSIS — J301 Allergic rhinitis due to pollen: Secondary | ICD-10-CM

## 2020-09-08 MED ORDER — FLUTICASONE PROPIONATE 50 MCG/ACT NA SUSP: 2.0000 | Freq: Every day | NASAL | 5 refills | Status: DC

## 2020-09-08 MED ORDER — ALBUTEROL SULFATE HFA 108 (90 BASE) MCG/ACT IN AERS: INHALATION_SPRAY | RESPIRATORY_TRACT | 3 refills | Status: DC

## 2020-09-08 NOTE — Telephone Encounter (Signed)
Medication: albuterol (VENTOLIN HFA) 108 (90 Base) MCG/ACT inhaler fluticasone (FLONASE) 50 MCG/ACT nasal spray  Has the pt contacted their pharmacy? YES  She say the pharmacy asked her to call her dr  Preferred pharmacy: [facility], [facility]  Please be advised refills may take up to 3 business days.  We ask that you follow up with your pharmacy.

## 2020-10-30 ENCOUNTER — Ambulatory Visit: Payer: Medicare Other | Admitting: Family Medicine

## 2020-11-04 ENCOUNTER — Ambulatory Visit
Admission: RE | Admit: 2020-11-04 | Discharge: 2020-11-04 | Disposition: A | Discharge status: Home / Self Care | Payer: Medicare Other | Attending: Family Medicine | Admitting: Family Medicine

## 2020-11-04 ENCOUNTER — Ambulatory Visit (INDEPENDENT_AMBULATORY_CARE_PROVIDER_SITE_OTHER): Payer: Medicare Other | Admitting: Family Medicine

## 2020-11-04 ENCOUNTER — Other Ambulatory Visit: Payer: Self-pay

## 2020-11-04 ENCOUNTER — Ambulatory Visit
Admission: RE | Admit: 2020-11-04 | Discharge: 2020-11-04 | Disposition: A | Discharge status: Home / Self Care | Payer: Medicare Other | Source: Ambulatory Visit | Attending: Family Medicine | Admitting: Family Medicine

## 2020-11-04 ENCOUNTER — Encounter: Payer: Self-pay | Admitting: Family Medicine

## 2020-11-04 VITALS — BP 161/80 | HR 75 | Temp 98.4°F | Resp 16 | Ht 65.0 in | Wt 167.0 lb

## 2020-11-04 DIAGNOSIS — M79672 Pain in left foot: Secondary | ICD-10-CM

## 2020-11-04 NOTE — Progress Notes (Signed)
Established patient visit   Patient: [name]   DOB: 01-18-1956   65 y.o. Female  MRN: [id number] Visit Date: 11/04/2020  Today's healthcare provider: Laurita Quint Kimber Esterly, FNP   Chief Complaint  Patient presents with  . Foot Pain    Heel    Subjective    HPI Patient presents today c/o pain in left heel. She has had symptoms X 3 weeks. She denies any injuries. She describes it as "pins and needles". She reports that it is hard to put weight on her left foot due to the pain. She has not tried anything OTC for her symptoms.   Denies any trauma to pain Has never had issues with it in the past Hurts when puts pressure on it Doesn't hurt when at rest Gets better when keeps walking Takes Tylenol for pain as needed Gets cramps in her foot Is happy with pain regimen, [facility] wants an xray to make sure nothing is wrong    Medications: Outpatient Medications Prior to Visit  Medication Sig  . acetaminophen (TYLENOL) 500 MG tablet Take 2 tablets (1,000 mg total) by mouth every 6 (six) hours as needed.  Marland Kitchen albuterol (VENTOLIN HFA) 108 (90 Base) MCG/ACT inhaler INHALE 2 PUFFS INTO THE LUNGS EVERY 6 HOURS AS NEEDED FOR WHEEZING OR SHORTNESS OF BREATH  . amLODipine (NORVASC) 10 MG tablet Take 1 tablet (10 mg total) by mouth daily.  Marland Kitchen atorvastatin (LIPITOR) 40 MG tablet TAKE 1 TABLET(40 MG) BY MOUTH DAILY  . azelastine (OPTIVAR) 0.05 % ophthalmic solution Place 1 drop into both eyes 2 (two) times daily.  . Cholecalciferol (VITAMIN D) 2000 UNITS tablet Take 2,000 Units by mouth daily.  . clopidogrel (PLAVIX) 75 MG tablet TAKE 1 TABLET(75 MG) BY MOUTH DAILY  . cyclobenzaprine (FLEXERIL) 10 MG tablet Take 1 tablet (10 mg total) by mouth at bedtime.  Marland Kitchen dexlansoprazole (DEXILANT) 60 MG capsule Take 1 capsule (60 mg total) by mouth daily.  . ferrous sulfate 325 (65 FE) MG tablet Take 1 tablet (325 mg total) by mouth daily with breakfast.  . fluticasone (FLONASE) 50 MCG/ACT nasal spray Place 2  sprays into both nostrils daily.  Marland Kitchen loratadine (CLARITIN) 10 MG tablet Take 1 tablet (10 mg total) by mouth daily.  . Omega-3 Fatty Acids (FISH OIL) 1200 MG CAPS Take 1 capsule by mouth daily.  . potassium chloride (KLOR-CON) 10 MEQ tablet Take 10 mEq by mouth daily. M-F only  . sucralfate (CARAFATE) 1 g tablet TAKE 1 TABLET BY MOUTH FOUR TIMES DAILY AT BEDTIME WITH MEALS   No facility-administered medications prior to visit.    Review of Systems  Constitutional: Negative for activity change and fatigue.  Musculoskeletal: Negative for arthralgias, gait problem, joint swelling and myalgias.  Skin: Negative for color change, pallor, rash and wound.       Objective    BP (!) 161/80   Pulse 75   Temp 98.4 F (36.9 C)   Resp 16   Ht 5\' 5"  (1.651 m)   Wt 167 lb (75.8 kg)   BMI 27.79 kg/m     Physical Exam Constitutional:      General: She is not in acute distress.    Appearance: Normal appearance. She is not ill-appearing.  Pulmonary:     Effort: Pulmonary effort is normal. No respiratory distress.  Musculoskeletal:     Right foot: Normal.     Left foot: Normal range of motion. Tenderness (Heel) present. No swelling, deformity  or bony tenderness. Normal pulse.  Neurological:     Mental Status: She is alert and oriented to person, place, and time.  Psychiatric:        Mood and Affect: Mood normal.       No results found for any visits on 11/04/20.  Assessment & Plan     Problem List Items Addressed This Visit   None   Visit Diagnoses    Pain of left heel    -  Primary   Relevant Orders   DG Foot Complete Left      Return in about 6 months (around 05/06/2021).       [place], [place] [phone] (phone) [phone] (fax)  [place]

## 2020-11-04 NOTE — Patient Instructions (Addendum)
Plantar Fasciitis Rehab Ask your health care provider which exercises are safe for you. Do exercises exactly as told by your health care provider and adjust them as directed. It is normal to feel mild stretching, pulling, tightness, or discomfort as you do these exercises. Stop right away i Plantar Fasciitis  Plantar fasciitis is a painful foot condition that affects the heel. It occurs when the band of tissue that connects the toes to the heel bone (plantar fascia) becomes irritated. This can happen as the result of exercising too much or doing other repetitive activities (overuse injury). Plantar fasciitis can cause mild irritation to severe pain that makes it difficult to walk or move. The pain is usually worse in the morning after sleeping, or after sitting or lying down for a period of time. Pain may also be worse after long periods of walking or standing. What are the causes? This condition may be caused by:  Standing for long periods of time.  Wearing shoes that do not have good arch support.  Doing activities that put stress on joints (high-impact activities). This includes ballet and exercise that makes your heart beat faster (aerobic exercise), such as running.  Being overweight.  An abnormal way of walking (gait).  Tight muscles in the back of your lower leg (calf).  High arches in your feet or flat feet.  Starting a new athletic activity. What are the signs or symptoms? The main symptom of this condition is heel pain. Pain may get worse after the following:  Taking the first steps after a time of rest, especially in the morning after awakening, or after you have been sitting or lying down for a while.  Long periods of standing still. Pain may decrease after 30-45 minutes of activity, such as gentle walking. How is this diagnosed? This condition may be diagnosed based on your medical history, a physical exam, and your symptoms. Your health care provider will check for:  A  tender area on the bottom of your foot.  A high arch in your foot or flat feet.  Pain when you move your foot.  Difficulty moving your foot. You may have imaging tests to confirm the diagnosis, such as:  X-rays.  Ultrasound.  MRI. How is this treated? Treatment for plantar fasciitis depends on how severe your condition is. Treatment may include:  Rest, ice, pressure (compression), and raising (elevating) the affected foot. This is called RICE therapy. Your health care provider may recommend RICE therapy along with over-the-counter pain medicines to manage your pain.  Exercises to stretch your calves and your plantar fascia.  A splint that holds your foot in a stretched, upward position while you sleep (night splint).  Physical therapy to relieve symptoms and prevent problems in the future.  Injections of steroid medicine (cortisone) to relieve pain and inflammation.  Stimulating your plantar fascia with electrical impulses (extracorporeal shock wave therapy). This is usually the last treatment option before surgery.  Surgery, if other treatments have not worked after 12 months. Follow these instructions at home: Managing pain, stiffness, and swelling  If directed, put ice on the painful area. To do this: ? Put ice in a plastic bag, or use a frozen bottle of water. ? Place a towel between your skin and the bag or bottle. ? Roll the bottom of your foot over the bag or bottle. ? Do this for 20 minutes, 2-3 times a day.  Wear athletic shoes that have air-sole or gel-sole cushions, or try soft shoe  inserts that are designed for plantar fasciitis.  Elevate your foot above the level of your heart while you are sitting or lying down.   Activity  Avoid activities that cause pain. Ask your health care provider what activities are safe for you.  Do physical therapy exercises and stretches as told by your health care provider.  Try activities and forms of exercise that are easier  on your joints (low impact). Examples include swimming, water aerobics, and biking. General instructions  Take over-the-counter and prescription medicines only as told by your health care provider.  Wear a night splint while sleeping, if told by your health care provider. Loosen the splint if your toes tingle, become numb, or turn cold and blue.  Maintain a healthy weight, or work with your health care provider to lose weight as needed.  Keep all follow-up visits. This is important. Contact a health care provider if you have:  Symptoms that do not go away with home treatment.  Pain that gets worse.  Pain that affects your ability to move or do daily activities. Summary  Plantar fasciitis is a painful foot condition that affects the heel. It occurs when the band of tissue that connects the toes to the heel bone (plantar fascia) becomes irritated.  Heel pain is the main symptom of this condition. It may get worse after exercising too much or standing still for a long time.  Treatment varies, but it usually starts with rest, ice, pressure (compression), and raising (elevating) the affected foot. This is called RICE therapy. Over-the-counter medicines can also be used to manage pain. This information is not intended to replace advice given to you by your health care provider. Make sure you discuss any questions you have with your health care provider. Document Revised: 10/28/2019 Document Reviewed: 10/28/2019 Elsevier Patient Education  2021 [place]. f you feel sudden pain or your pain gets worse. Do not begin these exercises until told by your health care provider. Stretching and range-of-motion exercises These exercises warm up your muscles and joints and improve the movement and flexibility of your foot. These exercises also help to relieve pain. Plantar fascia stretch 1. Sit with your left / right leg crossed over your opposite knee. 2. Hold your heel with one hand with that  thumb near your arch. With your other hand, hold your toes and gently pull them back toward the top of your foot. You should feel a stretch on the base (bottom) of your toes, or the bottom of your foot (plantar fascia), or both. 3. Hold this stretch for__________ seconds. 4. Slowly release your toes and return to the starting position. Repeat __________ times. Complete this exercise __________ times a day.   Gastrocnemius stretch, standing This exercise is also called a calf (gastroc) stretch. It stretches the muscles in the back of the upper calf. 1. Stand with your hands against a wall. 2. Extend your left / right leg behind you, and bend your front knee slightly. 3. Keeping your heels on the floor, your toes facing forward, and your back knee straight, shift your weight toward the wall. Do not arch your back. You should feel a gentle stretch in your upper calf. 4. Hold this position for __________ seconds. Repeat __________ times. Complete this exercise __________ times a day.   Soleus stretch, standing This exercise is also called a calf (soleus) stretch. It stretches the muscles in the back of the lower calf. 1. Stand with your hands against a wall. 2. Extend  your left / right leg behind you, and bend your front knee slightly. 3. Keeping your heels on the floor and your toes facing forward, bend your back knee and shift your weight slightly over your back leg. You should feel a gentle stretch deep in your lower calf. 4. Hold this position for __________ seconds. Repeat __________ times. Complete this exercise __________ times a day. Gastroc and soleus stretch, standing step This exercise stretches the muscles in the back of the lower leg. These muscles are in the upper calf (gastrocnemius) and the lower calf (soleus). 1. Stand with the ball of your left / right foot on the front of a step. The ball of your foot is on the walking surface, right under your toes. 2. Keep your other foot firmly  on the same step. 3. Hold on to the wall or a railing for balance. 4. Slowly lift your other foot, allowing your body weight to press your heel down over the edge of the front of the step. Keep knee straight and unbent. You should feel a stretch in your calf. 5. Hold this position for __________ seconds. 6. Return both feet to the step. 7. Repeat this exercise with a slight bend in your left / right knee. Repeat __________ times with your left / right knee straight and __________ times with your left / right knee bent. Complete this exercise __________ times a day. Balance exercise This exercise builds your balance and strength control of your arch to help take pressure off your plantar fascia. Single leg stand If this exercise is too easy, you can try it with your eyes closed or while standing on a pillow. 1. Without shoes, stand near a railing or in a doorway. You may hold on to the railing or door frame as needed. 2. Stand on your left / right foot. Keep your big toe down on the floor and lift the arch of your foot. You should feel a stretch across the bottom of your foot and your arch. Do not let your foot roll inward. 3. Hold this position for __________ seconds. Repeat __________ times. Complete this exercise __________ times a day. This information is not intended to replace advice given to you by your health care provider. Make sure you discuss any questions you have with your health care provider. Document Revised: 04/23/2020 Document Reviewed: 04/23/2020 Elsevier Patient Education  [place].

## 2020-11-06 ENCOUNTER — Other Ambulatory Visit: Payer: Self-pay | Admitting: Family Medicine

## 2020-11-06 DIAGNOSIS — M545 Low back pain, unspecified: Secondary | ICD-10-CM

## 2020-11-06 NOTE — Telephone Encounter (Signed)
Requested medication (s) are due for refill today: no  Requested medication (s) are on the active medication list:yes   Last refill:  08/18/2020  Future visit scheduled: yes  Notes to clinic:  this refill cannot be delegated    Requested Prescriptions  Pending Prescriptions Disp Refills   cyclobenzaprine (FLEXERIL) 10 MG tablet [Pharmacy Med Name: CYCLOBENZAPRINE 10MG  TABLETS] 90 tablet 0    Sig: TAKE 1 TABLET(10 MG) BY MOUTH AT BEDTIME      Not Delegated - Analgesics:  Muscle Relaxants Failed - 11/06/2020 12:48 PM      Failed - This refill cannot be delegated      Passed - Valid encounter within last 6 months    Recent Outpatient Visits           2 days ago Pain of left heel   [facility], Laurita Quint, FNP   2 months ago Right low back pain, unspecified chronicity, unspecified whether sciatica present   [facility] [facility], Vickki Muff, PA-C   7 months ago Annual physical exam   [place], [place]   8 months ago History of bronchitis   [facility], Clearnce Sorrel, [place]   1 year ago Essential hypertension   [employer], Clearnce Sorrel, [place]       Future Appointments             In 4 months Bacigalupo, Dionne Bucy, MD [facility], [place]

## 2020-11-11 ENCOUNTER — Other Ambulatory Visit: Payer: Self-pay | Admitting: Family Medicine

## 2020-11-11 ENCOUNTER — Telehealth: Payer: Self-pay

## 2020-11-11 DIAGNOSIS — M79672 Pain in left foot: Secondary | ICD-10-CM

## 2020-11-11 NOTE — Telephone Encounter (Signed)
Copied from [place] [id number]. Topic: Referral - Request for Referral >> Nov 11, 2020 11:36 AM Keene Breath wrote: Reason for CRM: Referral  Has patient seen PCP for this complaint? yes *If NO, is insurance requiring patient see PCP for this issue before PCP can refer them? Referral for which specialty: Podiatrist Preferred provider/office:  doctor recommendation Reason for referral: Left foot issues

## 2020-11-18 ENCOUNTER — Other Ambulatory Visit: Payer: Self-pay

## 2020-11-18 ENCOUNTER — Ambulatory Visit (INDEPENDENT_AMBULATORY_CARE_PROVIDER_SITE_OTHER): Payer: Medicare Other

## 2020-11-18 ENCOUNTER — Encounter: Payer: Self-pay | Admitting: Podiatry

## 2020-11-18 ENCOUNTER — Ambulatory Visit (INDEPENDENT_AMBULATORY_CARE_PROVIDER_SITE_OTHER): Payer: Medicare Other | Admitting: Podiatry

## 2020-11-18 DIAGNOSIS — M722 Plantar fascial fibromatosis: Secondary | ICD-10-CM

## 2020-11-18 DIAGNOSIS — M2012 Hallux valgus (acquired), left foot: Secondary | ICD-10-CM

## 2020-11-18 DIAGNOSIS — Q66222 Congenital metatarsus adductus, left foot: Secondary | ICD-10-CM

## 2020-11-18 NOTE — Progress Notes (Signed)
  Subjective:  Patient ID: [name], female    DOB: [birth year],  MRN: [id number]  Chief Complaint  Patient presents with  . Foot Pain    Patient presents today for left heel, forefoot pain x 3-4 weeks.  She says her foot cramps a lot and hurts in the mornings and standing up after sitting    65 y.o. female presents with the above complaint. History confirmed with patient.  She also has a painful bump on the inside of the big toe that hurts especially when she wears heels.  Objective:  Physical Exam: warm, good capillary refill, no trophic changes or ulcerative lesions, normal DP and PT pulses and normal sensory exam. Left Foot: Hallux valgus formerly with bunion and ambulation to the insertion of the plantar fascia on the medial heel tubercle  Radiographs: X-ray of the left foot: Hallux valgus with metatarsus adductus Assessment:   1. Plantar fasciitis of left foot   2. Hallux valgus, left   3. Metatarsus adductus of left foot      Plan:  Patient was evaluated and treated and all questions answered.  Discussed the etiology and treatment options for plantar fasciitis including stretching, formal physical therapy, supportive shoegears such as a running shoe or sneaker, pre fabricated orthoses, injection therapy, and oral medications. We also discussed the role of surgical treatment of this for patients who do not improve after exhausting non-surgical treatment options.  -XR reviewed with patient -Educated patient on stretching and icing of the affected limb -Injection delivered to the plantar fascia of the left foot.   After sterile prep with povidone-iodine solution and alcohol, the left heel was injected with 0.5cc 2% xylocaine plain, 0.5cc 0.5% marcaine plain, 5mg  triamcinolone acetonide, and 2mg  dexamethasone was injected along the plantar fascia at the insertion on the plantar calcaneus. The patient tolerated the procedure well without complication.  Discussed etiology  and treatment options for bunions.  I discussed with her that with her metatarsal adductus deformity at this makes absolute correction very difficult.  The metatarsus adductus correction with Lap[place]us would be difficult.  I recommend we consider 2 options: Definitive treatment would be arthrodesis of the first metatarsal phalangeal joint to eliminate risk of any recurrence, could consider minimally invasive approach with metatarsal and phalangeal osteotomies to correct the bunion knowing that this could recur or undercover of the deformity at some point.  She will consider these options.  Return in about 1 month (around 12/18/2020) for recheck plantar fasciitis, discuss bunion .

## 2020-11-18 NOTE — Patient Instructions (Signed)

## 2020-12-02 ENCOUNTER — Other Ambulatory Visit: Payer: Self-pay | Admitting: Physician Assistant

## 2020-12-02 ENCOUNTER — Other Ambulatory Visit: Payer: Self-pay | Admitting: Family Medicine

## 2020-12-02 DIAGNOSIS — Z8673 Personal history of transient ischemic attack (TIA), and cerebral infarction without residual deficits: Secondary | ICD-10-CM

## 2020-12-02 DIAGNOSIS — M545 Low back pain, unspecified: Secondary | ICD-10-CM

## 2020-12-02 NOTE — Telephone Encounter (Signed)
   Notes to clinic: Patient has appt on 03/25/2021 Review for enough medication until that time    Requested Prescriptions  Pending Prescriptions Disp Refills   clopidogrel (PLAVIX) 75 MG tablet [Pharmacy Med Name: CLOPIDOGREL 75MG  TABLETS] 90 tablet 0    Sig: TAKE 1 TABLET(75 MG) BY MOUTH DAILY      Hematology: Antiplatelets - clopidogrel Failed - 12/02/2020  6:56 AM      Failed - Evaluate AST, ALT within 2 months of therapy initiation.      Failed - HCT in normal range and within 180 days    Hematocrit  Date Value Ref Range Status  03/19/2020 42.0 34.0 - 46.6 % Final          Failed - HGB in normal range and within 180 days    Hemoglobin  Date Value Ref Range Status  03/19/2020 13.8 11.1 - 15.9 g/dL Final          Failed - PLT in normal range and within 180 days    Platelets  Date Value Ref Range Status  03/19/2020 316 150 - 450 x10E3/uL Final          Passed - ALT in normal range and within 360 days    ALT  Date Value Ref Range Status  03/19/2020 8 0 - 32 IU/L Final   SGPT (ALT)  Date Value Ref Range Status  04/19/2012 12 12 - 78 U/L Final          Passed - AST in normal range and within 360 days    AST  Date Value Ref Range Status  03/19/2020 13 0 - 40 IU/L Final   SGOT(AST)  Date Value Ref Range Status  04/19/2012 17 15 - 37 Unit/L Final          Passed - Valid encounter within last 6 months    Recent Outpatient Visits           4 weeks ago Pain of left heel   [facility], Laurita Quint, FNP   3 months ago Right low back pain, unspecified chronicity, unspecified whether sciatica present   [employer] [facility], Vickki Muff, PA-C   8 months ago Annual physical exam   [place], [place]   9 months ago History of bronchitis   [employer], Clearnce Sorrel, [place]   1 year ago Essential hypertension   [employer], Clearnce Sorrel, [place]       Future  Appointments             In 3 months Bacigalupo, Dionne Bucy, MD [facility], [place]

## 2020-12-23 ENCOUNTER — Ambulatory Visit (INDEPENDENT_AMBULATORY_CARE_PROVIDER_SITE_OTHER): Payer: Medicare Other | Admitting: Podiatry

## 2020-12-23 ENCOUNTER — Other Ambulatory Visit: Payer: Self-pay

## 2020-12-23 DIAGNOSIS — M2012 Hallux valgus (acquired), left foot: Secondary | ICD-10-CM | POA: Diagnosis not present

## 2020-12-23 DIAGNOSIS — M722 Plantar fascial fibromatosis: Secondary | ICD-10-CM

## 2020-12-23 DIAGNOSIS — Q66222 Congenital metatarsus adductus, left foot: Secondary | ICD-10-CM | POA: Diagnosis not present

## 2020-12-26 ENCOUNTER — Encounter: Payer: Self-pay | Admitting: Podiatry

## 2020-12-26 NOTE — Progress Notes (Signed)
  Subjective:  Patient ID: [name], female    DOB: July 01, 1956,  MRN: [id number]  Chief Complaint  Patient presents with  . Plantar Fasciitis      RE-CHECK PF AND DISCUSS BUNION left    65 y.o. female returns for follow-up with the above complaint. History confirmed with patient.  She is doing much better, the injection has limited majority of her pain.  The bunion is nonbothersome and she has been wearing the offloading splint which is helped  Objective:  Physical Exam: warm, good capillary refill, no trophic changes or ulcerative lesions, normal DP and PT pulses and normal sensory exam. Left Foot: Hallux valgus with bunion, no pain, no pain on the plantar fascia of the medial plantar heel  Radiographs: X-ray of the left foot: Hallux valgus with metatarsus adductus Assessment:   1. Plantar fasciitis of left foot   2. Hallux valgus, left   3. Metatarsus adductus of left foot      Plan:  Patient was evaluated and treated and all questions answered.  Overall she is doing well with the bunion and the plantar fasciitis which is largely resolved.  She will continue to monitor and use the offloading splint for the bunion.  Return as needed if Planter fasciitis or bunion pain returns  Return if symptoms worsen or fail to improve.

## 2021-01-19 ENCOUNTER — Other Ambulatory Visit: Payer: Self-pay

## 2021-01-19 DIAGNOSIS — K219 Gastro-esophageal reflux disease without esophagitis: Secondary | ICD-10-CM

## 2021-01-19 MED ORDER — DEXLANSOPRAZOLE 60 MG PO CPDR: 60.0000 mg | DELAYED_RELEASE_CAPSULE | Freq: Every day | ORAL | 1 refills | Status: DC

## 2021-01-19 NOTE — Telephone Encounter (Signed)
[facility] faxed refill request for the following medications:  dexlansoprazole (DEXILANT) 60 MG capsule  Please advise. Thanks TNP

## 2021-01-20 ENCOUNTER — Other Ambulatory Visit: Payer: Self-pay

## 2021-01-20 ENCOUNTER — Telehealth: Payer: Self-pay | Admitting: Family Medicine

## 2021-01-20 DIAGNOSIS — I10 Essential (primary) hypertension: Secondary | ICD-10-CM

## 2021-01-20 DIAGNOSIS — K2100 Gastro-esophageal reflux disease with esophagitis, without bleeding: Secondary | ICD-10-CM

## 2021-01-20 DIAGNOSIS — E7800 Pure hypercholesterolemia, unspecified: Secondary | ICD-10-CM

## 2021-01-20 DIAGNOSIS — Z8673 Personal history of transient ischemic attack (TIA), and cerebral infarction without residual deficits: Secondary | ICD-10-CM

## 2021-01-20 DIAGNOSIS — K219 Gastro-esophageal reflux disease without esophagitis: Secondary | ICD-10-CM

## 2021-01-20 DIAGNOSIS — J301 Allergic rhinitis due to pollen: Secondary | ICD-10-CM

## 2021-01-20 NOTE — Telephone Encounter (Signed)
[facility] faxed refill request for the following medications:  albuterol (VENTOLIN HFA) 108 (90 Base) MCG/ACT inhaler   amLODipine (NORVASC) 10 MG tablet    atorvastatin (LIPITOR) 40 MG tablet   clopidogrel (PLAVIX) 75 MG tablet   dexlansoprazole (DEXILANT) 60 MG capsule   fluticasone (FLONASE) 50 MCG/ACT nasal spray   sucralfate (CARAFATE) 1 g tablet   Please advise.

## 2021-01-21 MED ORDER — ATORVASTATIN CALCIUM 40 MG PO TABS: ORAL_TABLET | ORAL | 0 refills | Status: DC

## 2021-01-21 MED ORDER — AMLODIPINE BESYLATE 10 MG PO TABS: 10.0000 mg | ORAL_TABLET | Freq: Every day | ORAL | 0 refills | Status: DC

## 2021-01-21 MED ORDER — FLUTICASONE PROPIONATE 50 MCG/ACT NA SUSP: 2.0000 | Freq: Every day | NASAL | 0 refills | Status: DC

## 2021-01-21 MED ORDER — SUCRALFATE 1 G PO TABS: 1.0000 g | ORAL_TABLET | Freq: Three times a day (TID) | ORAL | 0 refills | Status: DC

## 2021-01-21 MED ORDER — DEXLANSOPRAZOLE 60 MG PO CPDR: 60.0000 mg | DELAYED_RELEASE_CAPSULE | Freq: Every day | ORAL | 0 refills | Status: DC

## 2021-01-21 MED ORDER — CLOPIDOGREL BISULFATE 75 MG PO TABS: ORAL_TABLET | ORAL | 0 refills | Status: DC

## 2021-01-21 MED ORDER — ALBUTEROL SULFATE HFA 108 (90 BASE) MCG/ACT IN AERS: INHALATION_SPRAY | RESPIRATORY_TRACT | 0 refills | Status: DC

## 2021-01-28 ENCOUNTER — Other Ambulatory Visit: Payer: Self-pay | Admitting: Physician Assistant

## 2021-01-28 DIAGNOSIS — J301 Allergic rhinitis due to pollen: Secondary | ICD-10-CM

## 2021-01-28 MED ORDER — FLUTICASONE PROPIONATE 50 MCG/ACT NA SUSP: 2.0000 | Freq: Every day | NASAL | 5 refills | Status: DC

## 2021-01-28 NOTE — Telephone Encounter (Signed)
[facility] faxed refill request for the following medications:  fluticasone (FLONASE) 50 MCG/ACT nasal spray   PT ask for a 90day prescription   Please advise.

## 2021-03-11 ENCOUNTER — Other Ambulatory Visit: Payer: Self-pay | Admitting: Family Medicine

## 2021-03-11 DIAGNOSIS — Z1231 Encounter for screening mammogram for malignant neoplasm of breast: Secondary | ICD-10-CM

## 2021-03-25 ENCOUNTER — Encounter: Payer: Self-pay | Admitting: Family Medicine

## 2021-03-25 ENCOUNTER — Ambulatory Visit (INDEPENDENT_AMBULATORY_CARE_PROVIDER_SITE_OTHER): Payer: Medicare Other | Admitting: Family Medicine

## 2021-03-25 ENCOUNTER — Encounter: Payer: Medicare Other | Admitting: Physician Assistant

## 2021-03-25 ENCOUNTER — Other Ambulatory Visit: Payer: Self-pay

## 2021-03-25 VITALS — BP 118/69 | HR 58 | Temp 98.9°F | Resp 16 | Ht 65.0 in | Wt 173.0 lb

## 2021-03-25 DIAGNOSIS — R7303 Prediabetes: Secondary | ICD-10-CM | POA: Diagnosis not present

## 2021-03-25 DIAGNOSIS — E7800 Pure hypercholesterolemia, unspecified: Secondary | ICD-10-CM | POA: Diagnosis not present

## 2021-03-25 DIAGNOSIS — L503 Dermatographic urticaria: Secondary | ICD-10-CM | POA: Diagnosis not present

## 2021-03-25 DIAGNOSIS — E559 Vitamin D deficiency, unspecified: Secondary | ICD-10-CM

## 2021-03-25 DIAGNOSIS — Z0000 Encounter for general adult medical examination without abnormal findings: Secondary | ICD-10-CM

## 2021-03-25 DIAGNOSIS — R7989 Other specified abnormal findings of blood chemistry: Secondary | ICD-10-CM

## 2021-03-25 DIAGNOSIS — I10 Essential (primary) hypertension: Secondary | ICD-10-CM

## 2021-03-25 DIAGNOSIS — Z862 Personal history of diseases of the blood and blood-forming organs and certain disorders involving the immune mechanism: Secondary | ICD-10-CM | POA: Diagnosis not present

## 2021-03-25 NOTE — Assessment & Plan Note (Signed)
Discussed antihistamine and moisturizing to avoid scratching/itching

## 2021-03-25 NOTE — Progress Notes (Signed)
Annual Wellness Visit     Patient: [name], Female    DOB: 05/19/1956, 65 y.o.   MRN: [id number] Visit Date: 03/25/2021  Today's Provider: Lavon Paganini, MD   Chief Complaint  Patient presents with   Medicare Wellness   Subjective    [name] is a 65 y.o. female who presents today for her Annual Wellness Visit. She reports consuming a general diet. The patient does not participate in regular exercise at present. She generally feels well. She reports sleeping well. She does have additional problems to discuss today.   HPI Dermatographia She had some irritation to the skin. This is a longstanding issue. She noticed when she has an area that itches and she scratches she will breakout in hives.   Screenings  12/09/16 Pap/HPV-negative 04/08/20 Mammogram-BI-RADS 1 02/04/20 Colonoscopy  Vaccines She is current on COVID vaccines. She has not received her shingrix from the 1st shot she had years ago. She is aware insurance will cover this vaccine at the pharmacy.  Medications  She is tolerating and in good compliance with medications. She is not having any adverse affects.   Patient Active Problem List   Diagnosis Date Noted   Dermatographia 03/25/2021   Prediabetes 05/08/2020   History of colonic polyps    Polyp of descending colon    Chronic right-sided low back pain without sciatica 04/17/2018   History of CVA (cerebrovascular accident) 03/14/2017   Gastritis    Absolute anemia 01/06/2015   Cervical disc disease 01/06/2015   Acid reflux 01/06/2015   Cervicogenic headache 01/06/2015   Hypercholesteremia 01/06/2015   Pseudoarthrosis of cervical spine ([place]) 08/26/2013   Avitaminosis D 10/15/2009   Cardiac conduction disorder 09/30/2008   Essential hypertension 09/30/2008   Blood in the urine 09/30/2008   Past Surgical History:  Procedure Laterality Date   ANTERIOR CERVICAL DECOMP/DISCECTOMY FUSION  10/19/2011   Procedure: ANTERIOR CERVICAL  DECOMPRESSION/DISCECTOMY FUSION 1 LEVEL/HARDWARE REMOVAL;  Surgeon: Elaina Hoops, MD;  Location: [place] [facility];  Service: Neurosurgery;  Laterality: N/A;  Cervical four - five  Anterior cervical decompression fusion with redo at six - seven  Rm 33   CARPAL TUNNEL RELEASE     COLONOSCOPY  08/2014   polyps   COLONOSCOPY WITH PROPOFOL N/A 02/04/2020   Procedure: COLONOSCOPY WITH PROPOFOL;  Surgeon: [name], MD;  Location: [facility] END[facility]COPY;  Service: Endoscopy;  Laterality: N/A;   ESOPHAGOGASTRODUODENOSCOPY (EGD) WITH PROPOFOL N/A 06/23/2015   Procedure: ESOPHAGOGASTRODUODENOSCOPY (EGD) WITH PROPOFOL;  Surgeon: [name], MD;  Location: [facility] ENDOSCOPY;  Service: Endoscopy;  Laterality: N/A;   NECK SURGERY     POSTERIOR CERVICAL FUSION/FORAMINOTOMY N/A 08/26/2013   Procedure: C/4-5,C/5-6,C/6-7 Posterior Cervical Fusion w/lateral mass fixation;  Surgeon: Elaina Hoops, MD;  Location: [place] [facility];  Service: Neurosurgery;  Laterality: N/A;   TUBAL LIGATION     Social History   Socioeconomic History   Marital status: Single    Spouse name: Not on file   Number of children: 3   Years of education: Not on file   Highest education level: Some college, no degree  Occupational History   Occupation: disable  Tobacco Use   Smoking status: Never   Smokeless tobacco: Never  Vaping Use   Vaping Use: Never used  Substance and Sexual Activity   Alcohol use: No   Drug use: No   Sexual activity: Not on file  Other Topics Concern   Not on file  Social History Narrative  Not on file   Social Determinants of Health   Financial Resource Strain: Not on file  Food Insecurity: Not on file  Transportation Needs: Not on file  Physical Activity: Not on file  Stress: Not on file  Social Connections: Not on file  Intimate Partner Violence: Not on file   Family History  Problem Relation Age of Onset   Hypertension Mother    Heart disease Mother    Kidney failure Sister    Breast cancer Neg Hx     Allergies  Allergen Reactions   Aspirin Nausea Only   Hydrochlorothiazide     Rash   Ibuprofen Other (See Comments)    Pt says her doctor doesn't want her taking this with the blood thinner she's taking   Lisinopril     Other reaction(s): Unknown   Sulfa Antibiotics Hives and Nausea Only       Medications: Outpatient Medications Prior to Visit  Medication Sig   acetaminophen (TYLENOL) 500 MG tablet Take 2 tablets (1,000 mg total) by mouth every 6 (six) hours as needed.   albuterol (VENTOLIN HFA) 108 (90 Base) MCG/ACT inhaler INHALE 2 PUFFS INTO THE LUNGS EVERY 6 HOURS AS NEEDED FOR WHEEZING OR SHORTNESS OF BREATH   amLODipine (NORVASC) 10 MG tablet Take 1 tablet (10 mg total) by mouth daily.   atorvastatin (LIPITOR) 40 MG tablet TAKE 1 TABLET(40 MG) BY MOUTH DAILY   azelastine (OPTIVAR) 0.05 % ophthalmic solution Place 1 drop into both eyes 2 (two) times daily.   Cholecalciferol (VITAMIN D) 2000 UNITS tablet Take 2,000 Units by mouth daily.   clopidogrel (PLAVIX) 75 MG tablet TAKE 1 TABLET(75 MG) BY MOUTH DAILY   cyclobenzaprine (FLEXERIL) 10 MG tablet TAKE 1 TABLET(10 MG) BY MOUTH AT BEDTIME   dexlansoprazole (DEXILANT) 60 MG capsule Take 1 capsule (60 mg total) by mouth daily.   ferrous sulfate 325 (65 FE) MG tablet Take 1 tablet (325 mg total) by mouth daily with breakfast.   fluticasone (FLONASE) 50 MCG/ACT nasal spray Place 2 sprays into both nostrils daily.   loratadine (CLARITIN) 10 MG tablet Take 1 tablet (10 mg total) by mouth daily.   Omega-3 Fatty Acids (FISH OIL) 1200 MG CAPS Take 1 capsule by mouth daily.   potassium chloride (KLOR-CON) 10 MEQ tablet Take 10 mEq by mouth daily. M-F only   sucralfate (CARAFATE) 1 g tablet Take 1 tablet (1 g total) by mouth 4 (four) times daily -  with meals and at bedtime.   No facility-administered medications prior to visit.    Allergies  Allergen Reactions   Aspirin Nausea Only   Hydrochlorothiazide     Rash   Ibuprofen Other  (See Comments)    Pt says her doctor doesn't want her taking this with the blood thinner she's taking   Lisinopril     Other reaction(s): Unknown   Sulfa Antibiotics Hives and Nausea Only    Patient Care Team: Gwyneth Sprout, FNP as PCP - General (Family Medicine) [name], MD as Consulting Physician (Gastroenterology) Kary Kos, MD as Consulting Physician (Neurosurgery)  Review of Systems  Eyes:  Positive for itching.  Musculoskeletal:  Positive for arthralgias and back pain.  All other systems reviewed and are negative.  Last CBC Lab Results  Component Value Date   WBC 7.6 03/19/2020   HGB 13.8 03/19/2020   HCT 42.0 03/19/2020   MCV 83 03/19/2020   MCH 27.2 03/19/2020   RDW 13.3 03/19/2020   PLT 316 03/19/2020  Last metabolic panel Lab Results  Component Value Date   GLUCOSE 92 03/19/2020   NA 141 03/19/2020   K 3.7 03/19/2020   CL 102 03/19/2020   CO2 26 03/19/2020   BUN 8 03/19/2020   CREATININE 0.72 03/19/2020   GFRNONAA 89 03/19/2020   GFRAA 103 03/19/2020   CALCIUM 9.4 03/19/2020   PROT 6.9 03/19/2020   ALBUMIN 4.1 03/19/2020   LABGLOB 2.8 03/19/2020   AGRATIO 1.5 03/19/2020   BILITOT 0.3 03/19/2020   ALKPHOS 109 03/19/2020   AST 13 03/19/2020   ALT 8 03/19/2020   ANIONGAP 10 02/24/2020   Last lipids Lab Results  Component Value Date   CHOL 153 03/19/2020   HDL 40 03/19/2020   LDLCALC 74 03/19/2020   TRIG 237 (H) 03/19/2020   CHOLHDL 3.8 03/19/2020   Last hemoglobin A1c Lab Results  Component Value Date   HGBA1C 5.9 (H) 03/19/2020   Last thyroid functions Lab Results  Component Value Date   TSH 1.360 05/18/2020   Last vitamin D Lab Results  Component Value Date   VD25OH 43.7 03/19/2020      Objective    Vitals: BP 118/69 (BP Location: Left Arm, Patient Position: Sitting, Cuff Size: Large)   Pulse (!) 58   Temp 98.9 F (37.2 C) (Oral)   Resp 16   Ht $R'5\' 5"'UP$  (1.651 m)   Wt 173 lb (78.5 kg)   BMI 28.79 kg/m  BP Readings  from Last 3 Encounters:  03/25/21 118/69  11/04/20 (!) 161/80  08/18/20 128/74   Wt Readings from Last 3 Encounters:  03/25/21 173 lb (78.5 kg)  11/04/20 167 lb (75.8 kg)  08/18/20 170 lb 12.8 oz (77.5 kg)      Physical Exam Vitals reviewed.  Constitutional:      General: She is not in acute distress.    Appearance: Normal appearance. She is well-developed. She is not diaphoretic.  HENT:     Head: Normocephalic and atraumatic.     Right Ear: Tympanic membrane, ear canal and external ear normal.     Left Ear: Tympanic membrane, ear canal and external ear normal.     Nose: Nose normal.     Mouth/Throat:     Mouth: Mucous membranes are moist.     Pharynx: Oropharynx is clear. No oropharyngeal exudate.  Eyes:     General: No scleral icterus.    Conjunctiva/sclera: Conjunctivae normal.     Pupils: Pupils are equal, round, and reactive to light.  Neck:     Thyroid: No thyromegaly.  Cardiovascular:     Rate and Rhythm: Normal rate and regular rhythm.     Pulses: Normal pulses.     Heart sounds: Normal heart sounds. No murmur heard. Pulmonary:     Effort: Pulmonary effort is normal. No respiratory distress.     Breath sounds: Normal breath sounds. No wheezing or rales.  Chest:     Comments: Breasts: breasts appear normal, no suspicious masses, no skin or nipple changes or axillary nodes  Abdominal:     General: There is no distension.     Palpations: Abdomen is soft.     Tenderness: There is no abdominal tenderness.  Musculoskeletal:        General: No deformity.     Cervical back: Neck supple.     Right lower leg: No edema.     Left lower leg: No edema.  Lymphadenopathy:     Cervical: No cervical adenopathy.  Skin:  General: Skin is warm and dry.     Findings: No rash.  Neurological:     Mental Status: She is alert and oriented to person, place, and time. Mental status is at baseline.     Sensory: No sensory deficit.     Motor: No weakness.     Gait: Gait normal.   Psychiatric:        Mood and Affect: Mood normal.        Behavior: Behavior normal.        Thought Content: Thought content normal.     Most recent functional status assessment: In your present state of health, do you have any difficulty performing the following activities: 03/25/2021  Hearing? N  Vision? N  Difficulty concentrating or making decisions? N  Walking or climbing stairs? N  Dressing or bathing? N  Doing errands, shopping? N  Some recent data might be hidden   Most recent fall risk assessment: Fall Risk  03/25/2021  Falls in the past year? 0  Number falls in past yr: 0  Injury with Fall? 0  Risk for fall due to : No Fall Risks  Follow up Falls evaluation completed    Most recent depression screenings: PHQ 2/9 Scores 03/25/2021 03/19/2020  PHQ - 2 Score 1 0  PHQ- 9 Score 1 -   Most recent cognitive screening: 6CIT Screen 03/25/2021  What Year? 0 points  What month? 0 points  What time? 0 points  Count back from 20 0 points  Months in reverse 0 points  Repeat phrase 0 points  Total Score 0   Most recent Audit-C alcohol use screening Alcohol Use Disorder Test (AUDIT) 03/25/2021  1. How often do you have a drink containing alcohol? 0  2. How many drinks containing alcohol do you have on a typical day when you are drinking? 0  3. How often do you have six or more drinks on one occasion? 0  AUDIT-C Score 0  Alcohol Brief Interventions/Follow-up -   A score of 3 or more in women, and 4 or more in men indicates increased risk for alcohol abuse, EXCEPT if all of the points are from question 1   No results found for any visits on 03/25/21.  Assessment & Plan     Problem List Items Addressed This Visit       Cardiovascular and Mediastinum   Essential hypertension    Well controlled Continue current medications Recheck metabolic panel F/u in 6 months       Relevant Orders   Comprehensive metabolic panel     Musculoskeletal and Integument   Dermatographia     Discussed antihistamine and moisturizing to avoid scratching/itching        Other   Absolute anemia   Hypercholesteremia    Previously well controlled Continue statin Repeat FLP and CMP Goal LDL < 70      Relevant Orders   Lipid Panel With LDL/HDL Ratio   Avitaminosis D   Relevant Orders   VITAMIN D 25 Hydroxy (Vit-D Deficiency, Fractures)   Prediabetes    Recommend low carb diet Recheck A1c       Relevant Orders   Hemoglobin A1c   Other Visit Diagnoses     Encounter for annual wellness visit (AWV) in Medicare patient    -  Primary   Annual physical exam       Relevant Orders   CBC with Differential/Platelet   Comprehensive metabolic panel   Lipid Panel With LDL/HDL Ratio  VITAMIN D 25 Hydroxy (Vit-D Deficiency, Fractures)   Hemoglobin A1c   TSH + free T4   Abnormal TSH       Relevant Orders   TSH + free T4       Annual wellness visit done today including the all of the following: Reviewed patient's Family Medical History Reviewed and updated list of patient's medical providers Assessment of cognitive impairment was done Assessed patient's functional ability Established a written schedule for health screening services Health Risk Assessent Completed and Reviewed  Exercise Activities and Dietary recommendations  Goals      Exercise 3x per week (30 min per time)     Recommend to walk 3 days a week for at least 30 minutes at a time.      Increase water intake     Recommend increasing water intake to 4 glasses a day.        Immunization History  Administered Date(s) Administered   Influenza-Unspecified 03/07/2017, 06/14/2018   MMR 09/09/1987   [place] Pediatric Vaccination 52mos to <25yrs 07/28/2020, 08/25/2020   Tdap 09/30/2008, 01/02/2019   Zoster Recombinat (Shingrix) 06/27/2019    Health Maintenance  Topic Date Due   Zoster Vaccines- Shingrix (2 of 2) 08/22/2019   INFLUENZA VACCINE  10/22/2021 (Originally 02/22/2021)   PAP  SMEAR-Modifier  12/09/2021   MAMMOGRAM  04/08/2022   COLONOSCOPY (Pts 45-39yrs Insurance coverage will need to be confirmed)  02/04/2027   TETANUS/TDAP  01/01/2029   Hepatitis C Screening  Completed   HIV Screening  Completed   Pneumococcal Vaccine 9-49 Years old  Aged Out   HPV VACCINES  Aged Out     Discussed health benefits of physical activity, and encouraged her to engage in regular exercise appropriate for her age and condition.      Return in about 6 months (around 09/22/2021) for chronic disease f/u.     I,Essence Turner,acting as a Education administrator for Lavon Paganini, MD.,have documented all relevant documentation on the behalf of Lavon Paganini, MD,as directed by  Lavon Paganini, MD while in the presence of Lavon Paganini, MD.  I, Lavon Paganini, MD, have reviewed all documentation for this visit. The documentation on 03/25/21 for the exam, diagnosis, procedures, and orders are all accurate and complete.   Chelbi Herber, Dionne Bucy, MD, MPH [facility]

## 2021-03-25 NOTE — Assessment & Plan Note (Signed)
Previously well controlled Continue statin Repeat FLP and CMP Goal LDL < 70 

## 2021-03-25 NOTE — Patient Instructions (Signed)
   The CDC recommends two doses of Shingrix (the shingles vaccine) separated by 2 to 6 months for adults age 65 years and older. I recommend checking with your insurance plan regarding coverage for this vaccine.   

## 2021-03-25 NOTE — Assessment & Plan Note (Signed)
Well controlled Continue current medications Recheck metabolic panel F/u in 6 months  

## 2021-03-25 NOTE — Assessment & Plan Note (Signed)
Recommend low carb diet °Recheck A1c  °

## 2021-03-26 DIAGNOSIS — Z0000 Encounter for general adult medical examination without abnormal findings: Secondary | ICD-10-CM | POA: Diagnosis not present

## 2021-03-26 DIAGNOSIS — Z862 Personal history of diseases of the blood and blood-forming organs and certain disorders involving the immune mechanism: Secondary | ICD-10-CM | POA: Diagnosis not present

## 2021-03-26 DIAGNOSIS — E7800 Pure hypercholesterolemia, unspecified: Secondary | ICD-10-CM | POA: Diagnosis not present

## 2021-03-26 DIAGNOSIS — I10 Essential (primary) hypertension: Secondary | ICD-10-CM | POA: Diagnosis not present

## 2021-03-26 DIAGNOSIS — E559 Vitamin D deficiency, unspecified: Secondary | ICD-10-CM | POA: Diagnosis not present

## 2021-03-27 LAB — LIPID PANEL WITH LDL/HDL RATIO
Cholesterol, Total: 158 mg/dL (ref 100–199)
HDL: 50 mg/dL (ref >39)
LDL Chol Calc (NIH): 86 mg/dL (ref 0–99)
LDL/HDL Ratio: 1.7 ratio (ref 0.0–3.2)
Triglycerides: 123 mg/dL (ref 0–149)
VLDL Cholesterol Cal: 22 mg/dL (ref 5–40)

## 2021-03-27 LAB — COMPREHENSIVE METABOLIC PANEL
ALT: 6 IU/L (ref 0–32)
AST: 15 IU/L (ref 0–40)
Albumin/Globulin Ratio: 1.8 (ref 1.2–2.2)
Albumin: 4.4 g/dL (ref 3.8–4.8)
Alkaline Phosphatase: 100 IU/L (ref 44–121)
BUN/Creatinine Ratio: 15 (ref 12–28)
BUN: 9 mg/dL (ref 8–27)
Bilirubin Total: 0.2 mg/dL (ref 0.0–1.2)
CO2: 21 mmol/L (ref 20–29)
Calcium: 9.6 mg/dL (ref 8.7–10.3)
Chloride: 101 mmol/L (ref 96–106)
Creatinine, Ser: 0.61 mg/dL (ref 0.57–1.00)
Globulin, Total: 2.5 g/dL (ref 1.5–4.5)
Glucose: 106 mg/dL — ABNORMAL HIGH (ref 65–99)
Potassium: 3.9 mmol/L (ref 3.5–5.2)
Sodium: 142 mmol/L (ref 134–144)
Total Protein: 6.9 g/dL (ref 6.0–8.5)
eGFR: 100 mL/min/{1.73_m2} (ref >59)

## 2021-03-27 LAB — CBC WITH DIFFERENTIAL/PLATELET
Basophils Absolute: 0 10*3/uL (ref 0.0–0.2)
Basos: 1 %
EOS (ABSOLUTE): 0.1 10*3/uL (ref 0.0–0.4)
Eos: 1 %
Hematocrit: 42.2 % (ref 34.0–46.6)
Hemoglobin: 13.7 g/dL (ref 11.1–15.9)
Immature Grans (Abs): 0 10*3/uL (ref 0.0–0.1)
Immature Granulocytes: 0 %
Lymphocytes Absolute: 3.3 10*3/uL — ABNORMAL HIGH (ref 0.7–3.1)
Lymphs: 42 %
MCH: 27.8 pg (ref 26.6–33.0)
MCHC: 32.5 g/dL (ref 31.5–35.7)
MCV: 86 fL (ref 79–97)
Monocytes Absolute: 0.4 10*3/uL (ref 0.1–0.9)
Monocytes: 5 %
Neutrophils Absolute: 4 10*3/uL (ref 1.4–7.0)
Neutrophils: 51 %
Platelets: 314 10*3/uL (ref 150–450)
RBC: 4.92 x10E6/uL (ref 3.77–5.28)
RDW: 13.7 % (ref 11.7–15.4)
WBC: 7.8 10*3/uL (ref 3.4–10.8)

## 2021-03-27 LAB — HEMOGLOBIN A1C
Est. average glucose Bld gHb Est-mCnc: 128 mg/dL
Hgb A1c MFr Bld: 6.1 % — ABNORMAL HIGH (ref 4.8–5.6)

## 2021-03-27 LAB — VITAMIN D 25 HYDROXY (VIT D DEFICIENCY, FRACTURES): Vit D, 25-Hydroxy: 37.9 ng/mL (ref 30.0–100.0)

## 2021-03-27 LAB — TSH+FREE T4
Free T4: 1.1 ng/dL (ref 0.82–1.77)
TSH: 0.577 u[IU]/mL (ref 0.450–4.500)

## 2021-04-05 ENCOUNTER — Other Ambulatory Visit: Payer: Self-pay | Admitting: Family Medicine

## 2021-04-09 ENCOUNTER — Ambulatory Visit
Admission: RE | Admit: 2021-04-09 | Discharge: 2021-04-09 | Disposition: A | Discharge status: Home / Self Care | Payer: Medicare Other | Source: Ambulatory Visit | Attending: Family Medicine | Admitting: Family Medicine

## 2021-04-09 ENCOUNTER — Other Ambulatory Visit: Payer: Self-pay

## 2021-04-09 DIAGNOSIS — Z1231 Encounter for screening mammogram for malignant neoplasm of breast: Secondary | ICD-10-CM | POA: Insufficient documentation

## 2021-04-11 ENCOUNTER — Other Ambulatory Visit: Payer: Self-pay | Admitting: Family Medicine

## 2021-04-11 DIAGNOSIS — E7800 Pure hypercholesterolemia, unspecified: Secondary | ICD-10-CM

## 2021-04-11 DIAGNOSIS — K2100 Gastro-esophageal reflux disease with esophagitis, without bleeding: Secondary | ICD-10-CM

## 2021-05-01 IMAGING — CR DG FOOT COMPLETE 3+V*L*
1 series · 3 of 3 positions shown · non-contrast
Comparison: None.

CLINICAL DATA: Heel pain for 3 weeks

EXAM:
LEFT FOOT - COMPLETE 3+ VIEW

[Series 1: dg foot complete left · 0.14mm/px · 3 of 3 slices shown]
[im 1/3]
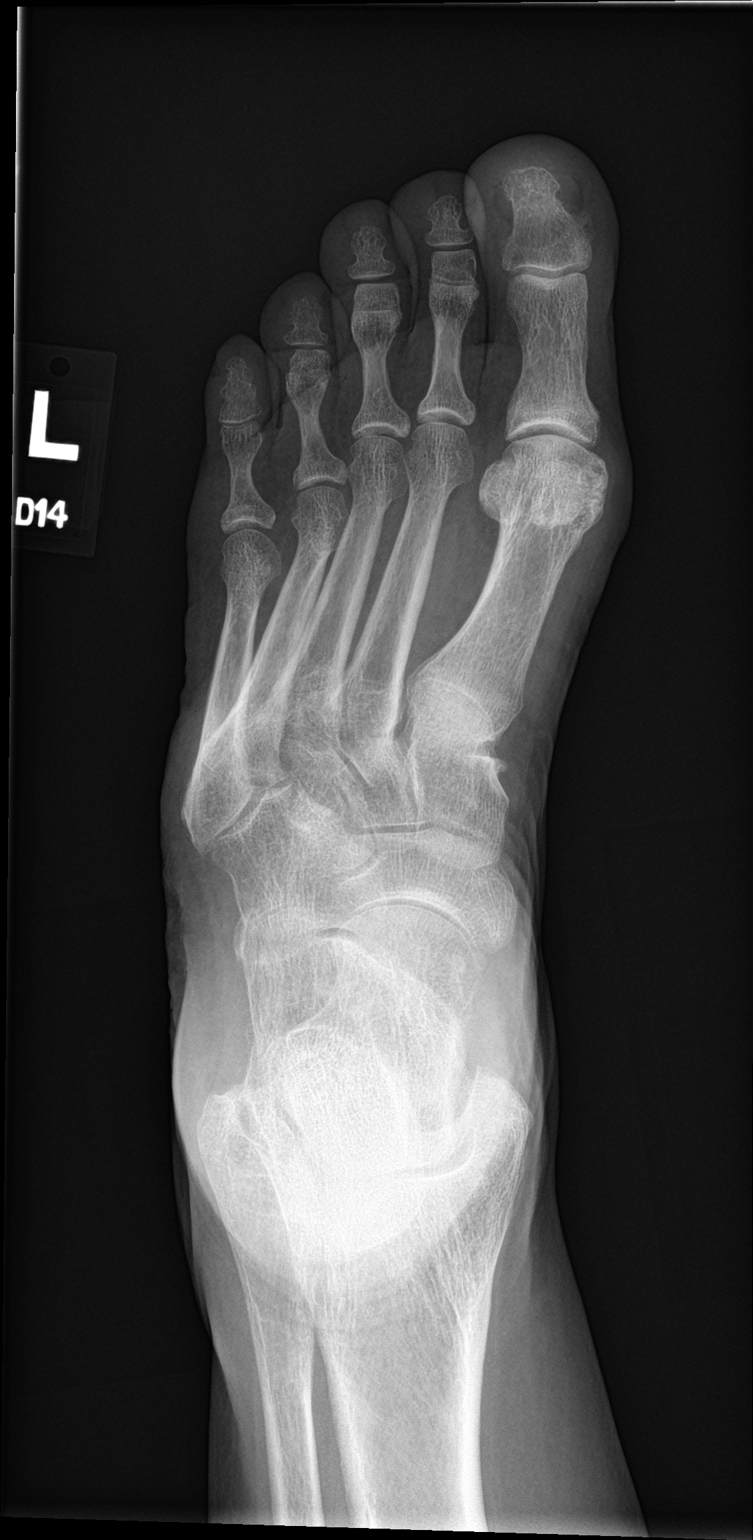
[im 2/3]
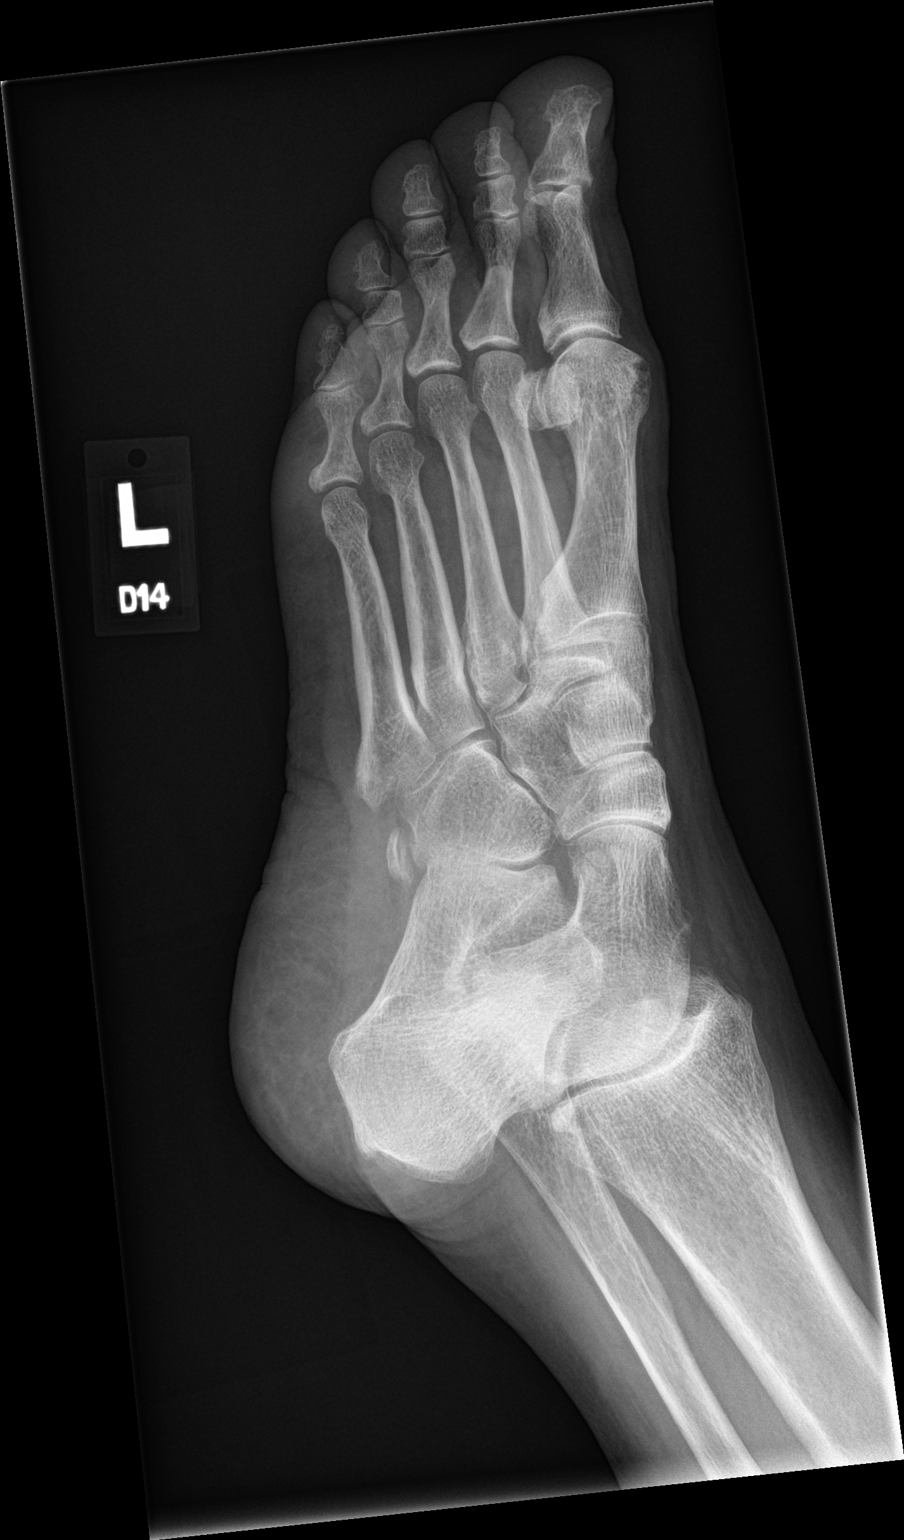
[im 3/3]
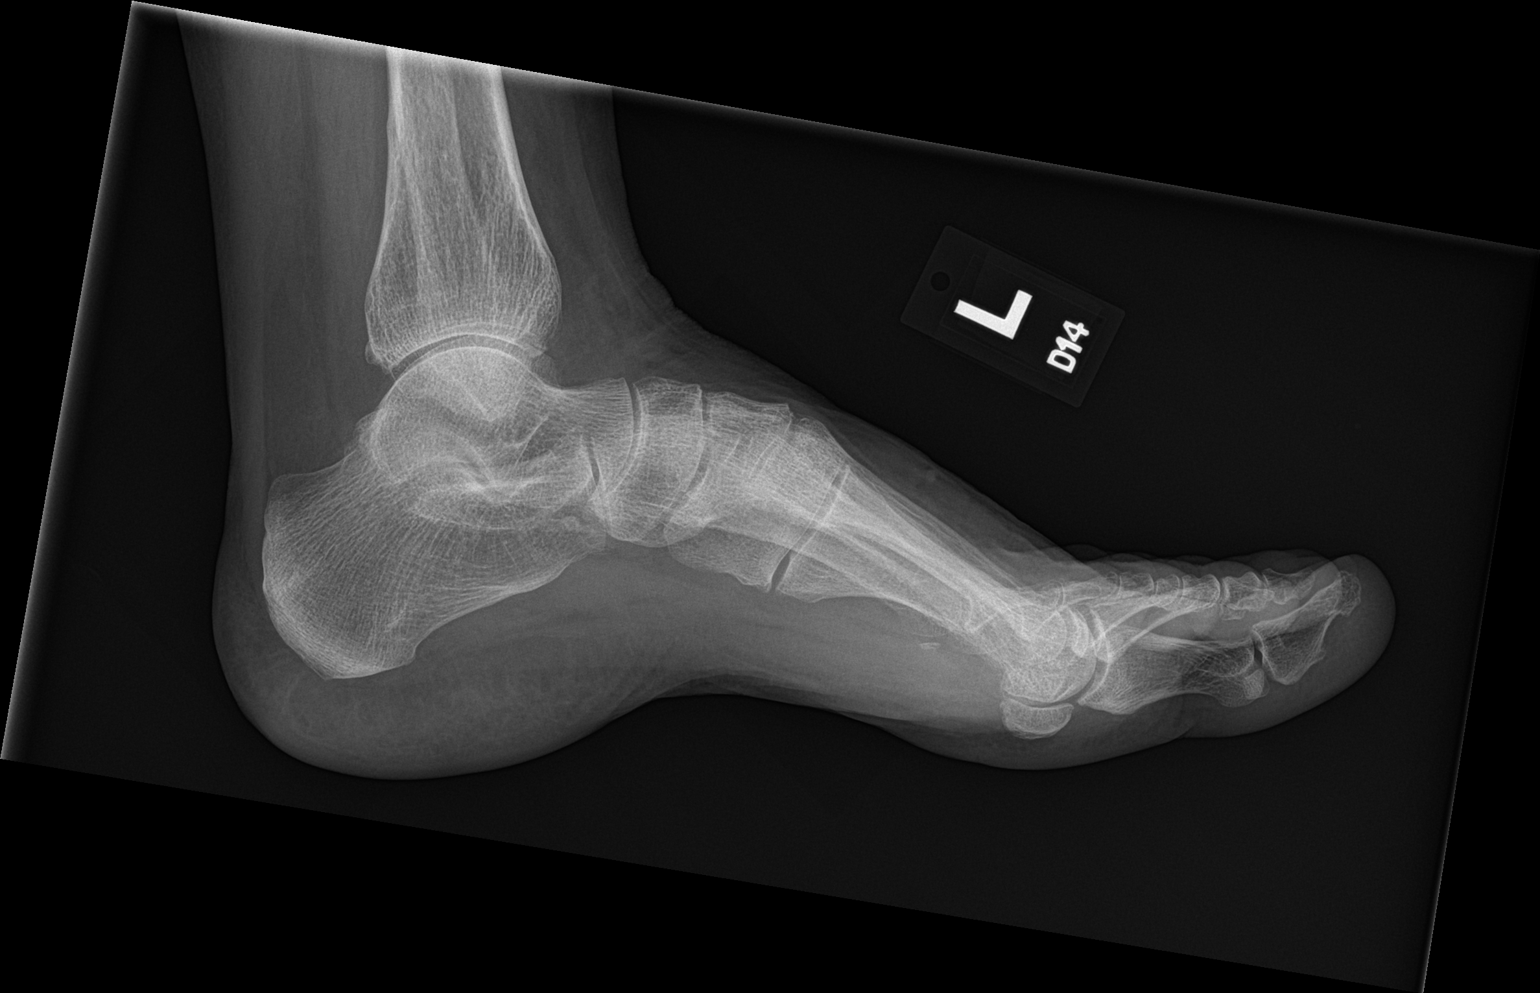

[3 of 3 positions shown; findings below may reference images not displayed]

FINDINGS: No acute fracture or dislocation. Mild Moneybagg Jumper alignment. Mild
first MTP joint space narrowing. Subtle rounded lucencies along the
medial margin of the first metatarsal head, which may reflect
sequela of crystalline arthropathy. Os peroneum. No calcaneal
enthesopathy. Soft tissues within normal limits.
IMPRESSION: 1. No acute fracture or dislocation.
2. Subtle rounded lucencies along the medial margin of the first
metatarsal head, which may reflect sequela of crystalline
arthropathy such as gout.
3. Mild Moneybagg Jumper alignment.

## 2021-05-31 ENCOUNTER — Telehealth: Payer: Self-pay | Admitting: Family Medicine

## 2021-05-31 NOTE — Telephone Encounter (Signed)
[facility] faxed refill request for the following medications:   clopidogrel (PLAVIX) 75 MG tablet    Please advise.  

## 2021-06-01 ENCOUNTER — Other Ambulatory Visit: Payer: Self-pay | Admitting: Family Medicine

## 2021-06-01 DIAGNOSIS — Z8673 Personal history of transient ischemic attack (TIA), and cerebral infarction without residual deficits: Secondary | ICD-10-CM

## 2021-06-01 MED ORDER — CLOPIDOGREL BISULFATE 75 MG PO TABS: ORAL_TABLET | ORAL | 3 refills | Status: DC

## 2021-06-03 ENCOUNTER — Telehealth: Payer: Self-pay | Admitting: Family Medicine

## 2021-06-03 DIAGNOSIS — I10 Essential (primary) hypertension: Secondary | ICD-10-CM

## 2021-06-03 NOTE — Telephone Encounter (Signed)
[facility] faxed refill request for the following medications: ° °amLODipine (NORVASC) 10 MG tablet  ° ° °Please advise. °

## 2021-06-04 MED ORDER — AMLODIPINE BESYLATE 10 MG PO TABS: 10.0000 mg | ORAL_TABLET | Freq: Every day | ORAL | 0 refills | Status: DC

## 2021-06-04 NOTE — Addendum Note (Signed)
Addended by: Minette Headland on: 06/04/2021 02:29 PM   Modules accepted: Orders

## 2021-06-21 ENCOUNTER — Other Ambulatory Visit: Payer: Self-pay | Admitting: Family Medicine

## 2021-06-30 ENCOUNTER — Other Ambulatory Visit: Payer: Self-pay | Admitting: Family Medicine

## 2021-06-30 DIAGNOSIS — K2100 Gastro-esophageal reflux disease with esophagitis, without bleeding: Secondary | ICD-10-CM

## 2021-06-30 DIAGNOSIS — E7800 Pure hypercholesterolemia, unspecified: Secondary | ICD-10-CM

## 2021-06-30 NOTE — Telephone Encounter (Signed)
Requested Prescriptions  Pending Prescriptions Disp Refills  . sucralfate (CARAFATE) 1 g tablet [Pharmacy Med Name: SUCRALFATE 1GM TABLETS] 360 tablet 0    Sig: TAKE 1 TABLET(1 GRAM) BY MOUTH FOUR TIMES DAILY AT BEDTIME WITH MEALS     Gastroenterology: Antiacids Passed - 06/30/2021  6:31 AM      Passed - Valid encounter within last 12 months    Recent Outpatient Visits          3 months ago Encounter for annual wellness visit (AWV) in Medicare patient   [facility] [place], Dionne Bucy, MD   7 months ago Pain of left heel   [employer] [facility], Laurita Quint, FNP   10 months ago Right low back pain, unspecified chronicity, unspecified whether sciatica present   [facility], Vickki Muff, PA-C   1 year ago Annual physical exam   [facility], Clearnce Sorrel, [place]   1 year ago History of bronchitis   [employer], Clearnce Sorrel, PA-C      Future Appointments            In 2 months Rollene Rotunda, Jaci Standard, [place], [place]           . atorvastatin (LIPITOR) 40 MG tablet [Pharmacy Med Name: ATORVASTATIN 40MG  TABLETS] 90 tablet 0    Sig: TAKE 1 TABLET(40 MG) BY MOUTH DAILY     Cardiovascular:  Antilipid - Statins Passed - 06/30/2021  6:31 AM      Passed - Total Cholesterol in normal range and within 360 days    Cholesterol, Total  Date Value Ref Range Status  03/26/2021 158 100 - 199 mg/dL Final         Passed - LDL in normal range and within 360 days    LDL Chol Calc (NIH)  Date Value Ref Range Status  03/26/2021 86 0 - 99 mg/dL Final         Passed - HDL in normal range and within 360 days    HDL  Date Value Ref Range Status  03/26/2021 50 >39 mg/dL Final         Passed - Triglycerides in normal range and within 360 days    Triglycerides  Date Value Ref Range Status  03/26/2021 123 0 - 149 mg/dL Final         Passed - Patient is not pregnant      Passed - Valid encounter  within last 12 months    Recent Outpatient Visits          3 months ago Encounter for annual wellness visit (AWV) in Medicare patient   [employer], Dionne Bucy, MD   7 months ago Pain of left heel   [facility], Laurita Quint, FNP   10 months ago Right low back pain, unspecified chronicity, unspecified whether sciatica present   [employer], Vickki Muff, PA-C   1 year ago Annual physical exam   [facility] [place], Clearnce Sorrel, [place]   1 year ago History of bronchitis   [employer], Clearnce Sorrel, PA-C      Future Appointments            In 2 months Rollene Rotunda, Jaci Standard, [place], [place]

## 2021-07-27 ENCOUNTER — Other Ambulatory Visit: Payer: Self-pay

## 2021-07-27 ENCOUNTER — Emergency Department
Admission: EM | Admit: 2021-07-27 | Discharge: 2021-07-27 | Disposition: A | Discharge status: Home / Self Care | Payer: Medicare Other | Attending: Emergency Medicine | Admitting: Emergency Medicine

## 2021-07-27 DIAGNOSIS — M5441 Lumbago with sciatica, right side: Secondary | ICD-10-CM | POA: Diagnosis not present

## 2021-07-27 DIAGNOSIS — M5431 Sciatica, right side: Secondary | ICD-10-CM

## 2021-07-27 DIAGNOSIS — M545 Low back pain, unspecified: Secondary | ICD-10-CM | POA: Diagnosis present

## 2021-07-27 MED ORDER — HYDROCODONE-ACETAMINOPHEN 5-325 MG PO TABS: 1.0000 | ORAL_TABLET | Freq: Four times a day (QID) | ORAL | 0 refills | Status: DC | PRN

## 2021-07-27 MED ORDER — METHYLPREDNISOLONE 4 MG PO TBPK: ORAL_TABLET | ORAL | 0 refills | Status: DC

## 2021-07-27 NOTE — ED Provider Notes (Signed)
[facility] Provider Note    Event Date/Time   First MD Initiated Contact with Patient 07/27/21 [phone]     (approximate)   History   Back Pain   HPI  [name] is a 66 y.o. female with past medical history of arthritis and prior cervical disc surgery here with lower back pain.  The patient states that since her chest before Christmas, she has had progressive worsening aching, throbbing, right paraspinal lumbar and sacral back pain.  The pain is worse with any kind of movement.  She states the pain shoots down the back of her leg.  It is worse with sitting in certain positions.  She does not believe she fell and denies any trauma.  No lower extremity weakness or numbness.  No loss of bowel or bladder function.  No fevers or chills.  No history of previous lumbar injuries, but she has had cervical disc issues and had 2 cervical disc surgeries.  Denies any other complaints.     Physical Exam   Triage Vital Signs: ED Triage Vitals  Enc Vitals Group     BP 07/27/21 0756 (!) 141/75     Pulse Rate 07/27/21 0756 87     Resp 07/27/21 0756 16     Temp 07/27/21 0754 98.7 F (37.1 C)     Temp Source 07/27/21 0754 Oral     SpO2 07/27/21 0756 96 %     Weight 07/27/21 0754 170 lb (77.1 kg)     Height 07/27/21 0754 5\' 5"  (1.651 m)     Head Circumference --      Peak Flow --      Pain Score 07/27/21 0754 10     Pain Loc --      Pain Edu? --      Excl. in [place]? --     Most recent vital signs: Vitals:   07/27/21 0754 07/27/21 0756  BP:  (!) 141/75  Pulse:  87  Resp:  16  Temp: 98.7 F (37.1 C)   SpO2:  96%     General: Awake, no distress.  CV:  Good peripheral perfusion.  Resp:  Normal effort.  Abd:  No distention.  Other:  Moderate right para lumbosacral spine tenderness, particularly over area of sciatic nerve exit, with reproduction of pain.  Pain with internal rotation and abduction of the left leg.  No midline tenderness.  Distal strength is 5  out of 5 in the toes and feet bilaterally.  Normal sensation light touch and pinprick.  Quad reflexes 1+ and symmetric.   ED Results / Procedures / Treatments   Labs (all labs ordered are listed, but only abnormal results are displayed) Labs Reviewed - No data to display   EKG     RADIOLOGY     PROCEDURES:  Critical Care performed: No  Procedures   MEDICATIONS ORDERED IN ED: Medications - No data to display   IMPRESSION / MDM / [place] / ED COURSE  I reviewed the triage vital signs and the nursing notes.                              Differential diagnosis includes, but is not limited to, sciatica, lumbar radiculopathy, osteoarthritis, spondylolisthesis.  66 year old well-appearing female here with lower back pain.  Patient has a history of cervical disc issues and surgeries in the past per report and review of her records.  Patient has fully intact distal neurovascular here.  No midline tenderness.  No fever, chills, or signs of infectious etiology such as osteo or epidural abscess.  She has no high risk features.  Pain has been present for less than 2 weeks.  Reflexes are intact.  No signs of cauda equina.  Will treat symptomatically with steroids and analgesics.  We had a discussion about need for outpatient follow-up and possible physical therapy.  I advised her to avoid heavy lifting and twisting.       FINAL CLINICAL IMPRESSION(S) / ED DIAGNOSES   Final diagnoses:  Sciatica of right side     Rx / DC Orders   ED Discharge Orders          Ordered    HYDROcodone-acetaminophen (NORCO/VICODIN) 5-325 MG tablet  Every 6 hours PRN        07/27/21 1032    methylPREDNISolone (MEDROL DOSEPAK) 4 MG TBPK tablet        07/27/21 1032             Note:  This document was prepared using Dragon voice recognition software and may include unintentional dictation errors.   Duffy Bruce, MD 07/27/21 [phone]

## 2021-07-27 NOTE — ED Provider Notes (Signed)
Emergency Medicine Provider Triage Evaluation Note  [name] , a 66 y.o. female  was evaluated in triage.  Pt complains of non-traumatic right lower back pain x 2 weeks. Pain is worse with movement.  Review of Systems  Positive: Back pain Negative: Urinary symptoms  Physical Exam  Temp 98.7 F (37.1 C) (Oral)  Gen:   Awake, no distress   Resp:  Normal effort  MSK:   Moves extremities without difficulty  Other:    Medical Decision Making  Medically screening exam initiated at 7:54 AM.  Appropriate orders placed.  [name] was informed that the remainder of the evaluation will be completed by another provider, this initial triage assessment does not replace that evaluation, and the importance of remaining in the ED until their evaluation is complete.    [name], FNP 07/27/21 0626    Duffy Bruce, MD 07/27/21 [phone]

## 2021-07-27 NOTE — ED Triage Notes (Signed)
Pt c/o right lower back pain over the past 2 weeks radiates into the buttock. Denies any injury

## 2021-07-27 NOTE — ED Notes (Signed)
See triage note  presents with lower back pain   states pain started couple of days ago  pian is moving into right leg  ambulates well to treatment room  denies any injury

## 2021-08-09 ENCOUNTER — Ambulatory Visit (INDEPENDENT_AMBULATORY_CARE_PROVIDER_SITE_OTHER): Payer: Medicare Other | Admitting: Family Medicine

## 2021-08-09 ENCOUNTER — Encounter: Payer: Self-pay | Admitting: Family Medicine

## 2021-08-09 ENCOUNTER — Other Ambulatory Visit: Payer: Self-pay

## 2021-08-09 VITALS — BP 147/75 | HR 75 | Temp 98.2°F | Resp 18 | Ht 65.0 in | Wt 174.0 lb

## 2021-08-09 DIAGNOSIS — M5441 Lumbago with sciatica, right side: Secondary | ICD-10-CM | POA: Diagnosis not present

## 2021-08-09 DIAGNOSIS — M545 Low back pain, unspecified: Secondary | ICD-10-CM | POA: Diagnosis not present

## 2021-08-09 DIAGNOSIS — K219 Gastro-esophageal reflux disease without esophagitis: Secondary | ICD-10-CM

## 2021-08-09 DIAGNOSIS — G8929 Other chronic pain: Secondary | ICD-10-CM | POA: Diagnosis not present

## 2021-08-09 NOTE — Progress Notes (Signed)
° °  SUBJECTIVE:   CHIEF COMPLAINT / HPI:   ER FOLLOW UP Hospital/facility: [facility] 07/27/21 Diagnosis: R sided Sciatica Procedures/tests:  Consultants: none New medications:  - norco, medrol dose pack Discharge instructions:  f/u with PCP Status: better - still with soreness in back but sciactic pain improved.  - no trauma - denies numbness, bladder/bowel incontinence. - s/p cervical fusion 2013, 2015 - denies exercise or core strengthening exercises.   MRI lumbar 2019 with L4-5 and L5-S1 disc bulges with minimal neural foraminal narrowing.  GERD - Meds: dexilant, carafate - Symptoms:  bloating, heartburn - denies dysphagia has not lost weight denies melena, hematochezia, hematemesis, and coffee ground emesis.  - Previous treatment: antacids and proton pump inhibitors. - recent worsening in GERD symptoms.  - denies changes in diet, NSAID use - nonsmoker. No alcohol use.  - no FH of GI malignancy.  - no known food triggers.  - h/o gastritis on UGI 2016. - colonoscopy 2021 with adenomatous polyp - wants to be referred to GI.  OBJECTIVE:   BP (!) 147/75 (BP Location: Right Arm, Patient Position: Sitting, Cuff Size: Large)    Pulse 75    Temp 98.2 F (36.8 C) (Temporal)    Resp 18    Ht 5\' 5"  (1.651 m)    Wt 174 lb (78.9 kg)    SpO2 96%    BMI 28.96 kg/m   Gen: well appearing, in NAD Card: RRR Lungs: CTAB Abd: soft, NTND, +BS Ext: WWP, no edema   ASSESSMENT/PLAN:   Acid reflux Recent worsening with no identified trigger. GI referred generated per patient request. Recommend continued lifestyle modifications including weight loss, smaller portion sizes, avoidance of common triggers.   Chronic right-sided low back pain without sciatica Improved, sciatica resolved with persistent occasional back pain. No red flags on history or exam. Recommend PT for core strengthening.      Myles Gip, DO

## 2021-08-09 NOTE — Assessment & Plan Note (Signed)
Improved, sciatica resolved with persistent occasional back pain. No red flags on history or exam. Recommend PT for core strengthening.

## 2021-08-09 NOTE — Assessment & Plan Note (Signed)
Recent worsening with no identified trigger. GI referred generated per patient request. Recommend continued lifestyle modifications including weight loss, smaller portion sizes, avoidance of common triggers.

## 2021-08-10 ENCOUNTER — Telehealth: Payer: Self-pay

## 2021-08-10 ENCOUNTER — Other Ambulatory Visit: Payer: Self-pay

## 2021-08-10 NOTE — Telephone Encounter (Signed)
CALLED PATIENT NO ANSWER LEFT VOICEMAIL FOR A CALL BACK ? ?

## 2021-08-10 NOTE — Progress Notes (Signed)
error 

## 2021-08-18 ENCOUNTER — Ambulatory Visit: Payer: Medicare Other | Attending: Family Medicine | Admitting: Physical Therapy

## 2021-08-18 ENCOUNTER — Other Ambulatory Visit: Payer: Self-pay

## 2021-08-18 ENCOUNTER — Encounter: Payer: Self-pay | Admitting: Physical Therapy

## 2021-08-18 DIAGNOSIS — R262 Difficulty in walking, not elsewhere classified: Secondary | ICD-10-CM | POA: Diagnosis not present

## 2021-08-18 DIAGNOSIS — M5441 Lumbago with sciatica, right side: Secondary | ICD-10-CM | POA: Insufficient documentation
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
## 2021-08-23 ENCOUNTER — Ambulatory Visit: Payer: Medicare Other | Admitting: Physical Therapy

## 2021-08-25 ENCOUNTER — Ambulatory Visit: Payer: Medicare Other | Attending: Family Medicine | Admitting: Physical Therapy

## 2021-08-25 ENCOUNTER — Encounter: Payer: Self-pay | Admitting: Physical Therapy

## 2021-08-25 ENCOUNTER — Other Ambulatory Visit: Payer: Self-pay

## 2021-08-25 VITALS — BP 140/80 | HR 56

## 2021-08-25 DIAGNOSIS — R262 Difficulty in walking, not elsewhere classified: Secondary | ICD-10-CM | POA: Insufficient documentation

## 2021-08-25 DIAGNOSIS — M5441 Lumbago with sciatica, right side: Secondary | ICD-10-CM | POA: Diagnosis not present
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
## 2021-08-29 ENCOUNTER — Other Ambulatory Visit: Payer: Self-pay | Admitting: Family Medicine

## 2021-08-29 DIAGNOSIS — I10 Essential (primary) hypertension: Secondary | ICD-10-CM

## 2021-08-30 ENCOUNTER — Ambulatory Visit: Payer: Medicare Other | Admitting: Physical Therapy

## 2021-08-30 ENCOUNTER — Encounter: Payer: Self-pay | Admitting: Physical Therapy

## 2021-08-30 ENCOUNTER — Encounter: Payer: Self-pay | Admitting: Gastroenterology

## 2021-08-30 ENCOUNTER — Other Ambulatory Visit: Payer: Self-pay

## 2021-08-30 ENCOUNTER — Ambulatory Visit (INDEPENDENT_AMBULATORY_CARE_PROVIDER_SITE_OTHER): Payer: Medicare Other | Admitting: Gastroenterology

## 2021-08-30 VITALS — BP 145/77 | HR 68 | Temp 98.5°F | Wt 174.0 lb

## 2021-08-30 DIAGNOSIS — R262 Difficulty in walking, not elsewhere classified: Secondary | ICD-10-CM | POA: Diagnosis not present

## 2021-08-30 DIAGNOSIS — M5441 Lumbago with sciatica, right side: Secondary | ICD-10-CM | POA: Diagnosis not present

## 2021-08-30 DIAGNOSIS — K219 Gastro-esophageal reflux disease without esophagitis: Secondary | ICD-10-CM | POA: Diagnosis not present

## 2021-08-30 MED ORDER — PANTOPRAZOLE SODIUM 40 MG PO TBEC: 40.0000 mg | DELAYED_RELEASE_TABLET | Freq: Every day | ORAL | 1 refills | Status: DC

## 2021-08-30 NOTE — Progress Notes (Signed)
Primary Care Physician: Pcp, No  Primary Gastroenterologist:  Dr. [name]  Chief Complaint  Patient presents with   Gastroesophageal Reflux   Follow-up    HPI: [name] is a 66 y.o. female here after seeing me for a history of GERD back in 2016 with her upper endoscopy showing some mild gastritis.  The patient also had a colonoscopy by me in 2021 with a recommendation to have a repeat colonoscopy in 7 years.  The appears to be on Carafate and Dexilant for her heartburn symptoms. She reports that the two of them are not working. She did change her eating habits. She reports bloating in the epigastric area. She denies weight loss, blacks or bloody stools. No report of nausea and vomiting.  She reports that the [place] worked very well for her at the beginning but then recently stopped working for her.  She also was put on Carafate which she has noticed has not done any difference in her symptoms.  Past Medical History:  Diagnosis Date   Allergy    Anemia    takes Ferrous Sulfate daily   Arthritis    GERD (gastroesophageal reflux disease)    takes OTC med   Hyperlipidemia    Hypertension    takes Amlodipine daily   Low back pain    occasionally    Current Outpatient Medications  Medication Sig Dispense Refill   albuterol (VENTOLIN HFA) 108 (90 Base) MCG/ACT inhaler INHALE 2 PUFFS INTO THE LUNGS EVERY 6 HOURS AS NEEDED FOR WHEEZING OR SHORTNESS OF BREATH 25.5 g 0   amLODipine (NORVASC) 10 MG tablet TAKE 1 TABLET(10 MG) BY MOUTH DAILY 90 tablet 0   atorvastatin (LIPITOR) 40 MG tablet TAKE 1 TABLET(40 MG) BY MOUTH DAILY 90 tablet 0   azelastine (OPTIVAR) 0.05 % ophthalmic solution Place 1 drop into both eyes 2 (two) times daily. 6 mL 12   Cholecalciferol (VITAMIN D) 2000 UNITS tablet Take 2,000 Units by mouth daily.     clopidogrel (PLAVIX) 75 MG tablet TAKE 1 TABLET(75 MG) BY MOUTH DAILY 90 tablet 3   cyclobenzaprine (FLEXERIL) 10 MG tablet TAKE 1 TABLET(10 MG) BY  MOUTH AT BEDTIME 90 tablet 0   dexlansoprazole (DEXILANT) 60 MG capsule Take 1 capsule (60 mg total) by mouth daily. 90 capsule 0   ferrous sulfate 325 (65 FE) MG tablet Take 1 tablet (325 mg total) by mouth daily with breakfast. 90 tablet 1   fluticasone (FLONASE) 50 MCG/ACT nasal spray Place 2 sprays into both nostrils daily. 16 g 5   loratadine (CLARITIN) 10 MG tablet Take 1 tablet (10 mg total) by mouth daily. 30 tablet 3   Omega-3 Fatty Acids (FISH OIL) 1200 MG CAPS Take 1 capsule by mouth daily.     potassium chloride (KLOR-CON) 10 MEQ tablet Take 10 mEq by mouth daily. M-F only     sucralfate (CARAFATE) 1 g tablet TAKE 1 TABLET(1 GRAM) BY MOUTH FOUR TIMES DAILY AT BEDTIME WITH MEALS 360 tablet 0   No current facility-administered medications for this visit.    Allergies as of 08/30/2021 - Review Complete 08/30/2021  Allergen Reaction Noted   Aspirin Nausea Only 10/11/2011   Hydrochlorothiazide  01/06/2015   Ibuprofen Other (See Comments) 09/04/2017   Lisinopril  01/07/2015   Sulfa antibiotics Hives and Nausea Only 10/11/2011    ROS:  General: Negative for anorexia, weight loss, fever, chills, fatigue, weakness. ENT: Negative for hoarseness, difficulty swallowing , nasal congestion. CV: Negative for  chest pain, angina, palpitations, dyspnea on exertion, peripheral edema.  Respiratory: Negative for dyspnea at rest, dyspnea on exertion, cough, sputum, wheezing.  GI: See history of present illness. GU:  Negative for dysuria, hematuria, urinary incontinence, urinary frequency, nocturnal urination.  Endo: Negative for unusual weight change.    Physical Examination:   BP (!) 145/77    Pulse 68    Temp 98.5 F (36.9 C) (Oral)    Wt 174 lb (78.9 kg)    BMI 28.96 kg/m   General: Well-nourished, well-developed in no acute distress.  Eyes: No icterus. Conjunctivae pink. Lungs: Clear to auscultation bilaterally. Non-labored. Heart: Regular rate and rhythm, no murmurs rubs or  gallops.  Abdomen: Bowel sounds are normal, nontender, nondistended, no hepatosplenomegaly or masses, no abdominal bruits or hernia , no rebound or guarding.   Extremities: No lower extremity edema. No clubbing or deformities. Neuro: Alert and oriented x 3.  Grossly intact. Skin: Warm and dry, no jaundice.   Psych: Alert and cooperative, normal mood and affect.  Labs:    Imaging Studies: No results found.  Assessment and Plan:   [name] is a 66 y.o. y/o female who comes in today with a history of heartburn and an upper endoscopy in the past that did not show any Barrett's or worrisome features.  The patient has been on Dexilant and Carafate was added by another physician.  She reports that the Carafate has not been helping her in the [place] is not working as it used to.  She also states that she is doing better while avoiding some trigger foods.  The patient has been told to stop the Carafate and she will be started on pantoprazole once a day.  She will also stop the Dexilant.  She has been told that if the symptoms do not improve that she can then try the pantoprazole twice a day and if that does not work then she will be set up for an upper endoscopy.  The patient has been explained the plan agrees with it.   pantoprazole  [name], MD. [name]    Note: This dictation was prepared with Dragon dictation along with smaller phrase technology. Any transcriptional errors that result from this process are unintentional.
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
## 2021-09-01 ENCOUNTER — Ambulatory Visit: Payer: Medicare Other | Admitting: Physical Therapy

## 2021-09-06 ENCOUNTER — Ambulatory Visit: Payer: Medicare Other | Admitting: Physical Therapy

## 2021-09-08 ENCOUNTER — Ambulatory Visit: Payer: Medicare Other | Admitting: Physical Therapy

## 2021-09-09 ENCOUNTER — Telehealth: Payer: Self-pay | Admitting: Physical Therapy

## 2021-09-09 NOTE — Telephone Encounter (Signed)
Called patient back as requested. No answer. Left VM.   [name]. Graylon Good, PT, DPT 09/09/21, 11:10 AM

## 2021-09-13 ENCOUNTER — Telehealth: Payer: Self-pay | Admitting: Physical Therapy

## 2021-09-13 ENCOUNTER — Ambulatory Visit: Payer: Medicare Other | Admitting: Physical Therapy

## 2021-09-13 NOTE — Telephone Encounter (Signed)
Called patient back as requested. She states she wants to try doing her HEP independently for a while and stop coming to PT because she doesn't want to keep canceling and she just cannot seem to get over the illness she has been dealing with recently. She stated she does not have any questions at this time and will call back to reschedule if needed.   Everlean Alstrom. Graylon Good, PT, DPT 09/13/21, 11:05 AM

## 2021-09-15 ENCOUNTER — Ambulatory Visit: Payer: Medicare Other | Admitting: Physical Therapy

## 2021-09-20 ENCOUNTER — Encounter: Payer: Medicare Other | Admitting: Physical Therapy

## 2021-09-22 ENCOUNTER — Encounter: Payer: Medicare Other | Admitting: Physical Therapy

## 2021-09-23 ENCOUNTER — Encounter: Payer: Self-pay | Admitting: Family Medicine

## 2021-09-23 ENCOUNTER — Ambulatory Visit (INDEPENDENT_AMBULATORY_CARE_PROVIDER_SITE_OTHER): Payer: Medicare Other | Admitting: Family Medicine

## 2021-09-23 ENCOUNTER — Other Ambulatory Visit: Payer: Self-pay

## 2021-09-23 VITALS — BP 124/69 | HR 83 | Temp 97.5°F | Resp 16 | Wt 174.0 lb

## 2021-09-23 DIAGNOSIS — E785 Hyperlipidemia, unspecified: Secondary | ICD-10-CM | POA: Diagnosis not present

## 2021-09-23 DIAGNOSIS — I10 Essential (primary) hypertension: Secondary | ICD-10-CM | POA: Diagnosis not present

## 2021-09-23 DIAGNOSIS — R7303 Prediabetes: Secondary | ICD-10-CM | POA: Diagnosis not present

## 2021-09-23 DIAGNOSIS — K219 Gastro-esophageal reflux disease without esophagitis: Secondary | ICD-10-CM | POA: Insufficient documentation

## 2021-09-23 DIAGNOSIS — E7800 Pure hypercholesterolemia, unspecified: Secondary | ICD-10-CM

## 2021-09-23 DIAGNOSIS — Z8673 Personal history of transient ischemic attack (TIA), and cerebral infarction without residual deficits: Secondary | ICD-10-CM

## 2021-09-23 LAB — POCT GLYCOSYLATED HEMOGLOBIN (HGB A1C): Hemoglobin A1C: 6 % — AB (ref 4.0–5.6)

## 2021-09-23 NOTE — Assessment & Plan Note (Signed)
Continue to recommend balanced, lower carb meals. Smaller meal size, adding snacks. Choosing water as drink of choice and increasing purposeful exercise. ?A1c stable, 6% down from 6.1% ? ? ?

## 2021-09-23 NOTE — Patient Instructions (Signed)
The 10-year ASCVD risk score (Arnett DK, et al., 2019) is: 6.4% ?  Values used to calculate the score: ?    Age: 66 years ?    Sex: Female ?    Is Non-Hispanic African American: No ?    Diabetic: No ?    Tobacco smoker: No ?    Systolic Blood Pressure: 539 mmHg ?    Is BP treated: Yes ?    HDL Cholesterol: 50 mg/dL ?    Total Cholesterol: 158 mg/dL ? ?

## 2021-09-23 NOTE — Assessment & Plan Note (Signed)
On statin; recommend LDL at 70 or less given pre-dm dx and hx of CVA ?We recommend diet low in saturated fat and regular exercise - 30 min at least 5 times per week ?Remains on plavix ? ?

## 2021-09-23 NOTE — Progress Notes (Signed)
Established patient visit   Patient: [name]   DOB: 1956-04-19   66 y.o. Female  MRN: [id number] Visit Date: 09/23/2021  Today's healthcare provider: Gwyneth Sprout, FNP   Chief Complaint  Patient presents with   Prediabetes   Hypertension   Hyperlipidemia   Subjective    HPI  Prediabetes, Follow-up  Lab Results  Component Value Date   HGBA1C 6.0 (A) 09/23/2021   HGBA1C 6.1 (H) 03/26/2021   HGBA1C 5.9 (H) 03/19/2020   GLUCOSE 106 (H) 03/26/2021   GLUCOSE 92 03/19/2020   GLUCOSE 114 (H) 02/24/2020    Last seen for for this6 months ago.  Management since that visit includes recommend low carb diet. Current symptoms include none and have been unchanged.  Prior visit with dietician: no Current diet: well balanced Current exercise: no regular exercise  Pertinent Labs:    Component Value Date/Time   CHOL 158 03/26/2021 0822   TRIG 123 03/26/2021 0822   CHOLHDL 3.8 03/19/2020 1129   CHOLHDL 3.2 02/28/2018 0928   CREATININE 0.61 03/26/2021 0822   CREATININE 0.71 04/19/2012 0526    Wt Readings from Last 3 Encounters:  09/23/21 174 lb (78.9 kg)  08/30/21 174 lb (78.9 kg)  08/09/21 174 lb (78.9 kg)    -----------------------------------------------------------------------------------------  Hypertension, follow-up  BP Readings from Last 3 Encounters:  09/23/21 124/69  08/30/21 (!) 145/77  08/25/21 140/80   Wt Readings from Last 3 Encounters:  09/23/21 174 lb (78.9 kg)  08/30/21 174 lb (78.9 kg)  08/09/21 174 lb (78.9 kg)     She was last seen for hypertension 6 months ago.  BP at that visit was 118/69. Management since that visit includes none.  She reports excellent compliance with treatment. She is not having side effects. She is following a Regular diet. She is not exercising. She does not smoke.  Use of agents associated with hypertension: none.   Outside blood pressures are not checked. Symptoms: No chest pain No chest pressure   No palpitations No syncope  No dyspnea No orthopnea  No paroxysmal nocturnal dyspnea No lower extremity edema   Pertinent labs: Lab Results  Component Value Date   CHOL 158 03/26/2021   HDL 50 03/26/2021   LDLCALC 86 03/26/2021   TRIG 123 03/26/2021   CHOLHDL 3.8 03/19/2020   Lab Results  Component Value Date   NA 142 03/26/2021   K 3.9 03/26/2021   CREATININE 0.61 03/26/2021   EGFR 100 03/26/2021   GLUCOSE 106 (H) 03/26/2021   TSH 0.577 03/26/2021     The 10-year ASCVD risk score (Arnett DK, et al., 2019) is: 6.4%   ---------------------------------------------------------------------------------------------------  Lipid/Cholesterol, Follow-up  Last lipid panel Other pertinent labs  Lab Results  Component Value Date   CHOL 158 03/26/2021   HDL 50 03/26/2021   LDLCALC 86 03/26/2021   TRIG 123 03/26/2021   CHOLHDL 3.8 03/19/2020   Lab Results  Component Value Date   ALT 6 03/26/2021   AST 15 03/26/2021   PLT 314 03/26/2021   TSH 0.577 03/26/2021     She was last seen for this 6 months ago.  Management since that visit includes none.  She reports excellent compliance with treatment. She is not having side effects.   Symptoms: No chest pain No chest pressure/discomfort  No dyspnea No lower extremity edema  No numbness or tingling of extremity No orthopnea  No palpitations No paroxysmal nocturnal dyspnea  No speech difficulty No syncope  Current diet: well balanced Current exercise: no regular exercise  The 10-year ASCVD risk score (Arnett DK, et al., 2019) is: 6.4%  ---------------------------------------------------------------------------------------------------   Medications: Outpatient Medications Prior to Visit  Medication Sig   albuterol (VENTOLIN HFA) 108 (90 Base) MCG/ACT inhaler INHALE 2 PUFFS INTO THE LUNGS EVERY 6 HOURS AS NEEDED FOR WHEEZING OR SHORTNESS OF BREATH   amLODipine (NORVASC) 10 MG tablet TAKE 1 TABLET(10 MG) BY MOUTH DAILY    atorvastatin (LIPITOR) 40 MG tablet TAKE 1 TABLET(40 MG) BY MOUTH DAILY   azelastine (OPTIVAR) 0.05 % ophthalmic solution Place 1 drop into both eyes 2 (two) times daily.   Cholecalciferol (VITAMIN D) 2000 UNITS tablet Take 2,000 Units by mouth daily.   clopidogrel (PLAVIX) 75 MG tablet TAKE 1 TABLET(75 MG) BY MOUTH DAILY   cyclobenzaprine (FLEXERIL) 10 MG tablet TAKE 1 TABLET(10 MG) BY MOUTH AT BEDTIME   ferrous sulfate 325 (65 FE) MG tablet Take 1 tablet (325 mg total) by mouth daily with breakfast.   fluticasone (FLONASE) 50 MCG/ACT nasal spray Place 2 sprays into both nostrils daily.   loratadine (CLARITIN) 10 MG tablet Take 1 tablet (10 mg total) by mouth daily.   Omega-3 Fatty Acids (FISH OIL) 1200 MG CAPS Take 1 capsule by mouth daily.   pantoprazole (PROTONIX) 40 MG tablet Take 1 tablet (40 mg total) by mouth daily.   potassium chloride (KLOR-CON) 10 MEQ tablet Take 10 mEq by mouth daily. M-F only   [DISCONTINUED] dexlansoprazole (DEXILANT) 60 MG capsule Take 1 capsule (60 mg total) by mouth daily.   [DISCONTINUED] sucralfate (CARAFATE) 1 g tablet TAKE 1 TABLET(1 GRAM) BY MOUTH FOUR TIMES DAILY AT BEDTIME WITH MEALS   No facility-administered medications prior to visit.    Review of Systems     Objective    BP 124/69    Pulse 83    Temp (!) 97.5 F (36.4 C) (Oral)    Resp 16    Wt 174 lb (78.9 kg)    SpO2 99%    BMI 28.96 kg/m    Physical Exam Vitals and nursing note reviewed.  Constitutional:      General: She is not in acute distress.    Appearance: Normal appearance. She is overweight. She is not ill-appearing, toxic-appearing or diaphoretic.  HENT:     Head: Normocephalic and atraumatic.  Cardiovascular:     Rate and Rhythm: Normal rate and regular rhythm.     Pulses: Normal pulses.     Heart sounds: Normal heart sounds. No murmur heard.   No friction rub. No gallop.  Pulmonary:     Effort: Pulmonary effort is normal. No respiratory distress.     Breath sounds:  Normal breath sounds. No stridor. No wheezing, rhonchi or rales.  Chest:     Chest wall: No tenderness.  Abdominal:     General: Bowel sounds are normal.     Palpations: Abdomen is soft.  Musculoskeletal:        General: No swelling, tenderness, deformity or signs of injury. Normal range of motion.     Right lower leg: No edema.     Left lower leg: No edema.  Skin:    General: Skin is warm and dry.     Capillary Refill: Capillary refill takes less than 2 seconds.     Coloration: Skin is not jaundiced or pale.     Findings: No bruising, erythema, lesion or rash.  Neurological:     General: No focal deficit present.  Mental Status: She is alert and oriented to person, place, and time. Mental status is at baseline.     Cranial Nerves: No cranial nerve deficit.     Sensory: No sensory deficit.     Motor: No weakness.     Coordination: Coordination normal.  Psychiatric:        Mood and Affect: Mood normal.        Behavior: Behavior normal.        Thought Content: Thought content normal.        Judgment: Judgment normal.     Results for orders placed or performed in visit on 09/23/21  POCT glycosylated hemoglobin (Hb A1C)  Result Value Ref Range   Hemoglobin A1C 6.0 (A) 4.0 - 5.6 %   HbA1c POC (<> result, manual entry)     HbA1c, POC (prediabetic range)     HbA1c, POC (controlled diabetic range)      Assessment & Plan     Problem List Items Addressed This Visit       Cardiovascular and Mediastinum   Essential hypertension    Chronic, stable- 124/69 Denies CP Denies SOB Denies DOE No LE Edema noted on exam Continue medications- norvasc 10mg  Refills stable Seek emergent care if you develop CP, chest pain or chest pressure         Digestive   Gastroesophageal reflux disease without esophagitis    F/b GI- [facility] Started on [employer] [place] [place] [employer], working on diet modification Was previously on dexilant with carafate Denies esophagitis         Other   History of CVA  (cerebrovascular accident)    On statin; recommend LDL at 70 or less given pre-dm dx and hx of CVA We recommend diet low in saturated fat and regular exercise - 30 min at least 5 times per week Remains on plavix       Hypercholesteremia    Mixed hyperlipidemia Encouraged low fat diet, pt has joined gym Working on PT exercises for hx of sciatica and chronic back pain- is improving Denies complications with stain use Chronic, well controlled      RESOLVED: Hyperlipidemia LDL goal <100    On statin; recommend LDL at 100 given pre-dm dx We recommend diet low in saturated fat and regular exercise - 30 min at least 5 times per week       Prediabetes - Primary    Continue to recommend balanced, lower carb meals. Smaller meal size, adding snacks. Choosing water as drink of choice and increasing purposeful exercise. A1c stable, 6% down from 6.1%        Relevant Orders   POCT glycosylated hemoglobin (Hb A1C) (Completed)     Return in about 6 months (around 03/26/2022) for annual examination.      Vonna Kotyk, FNP, have reviewed all documentation for this visit. The documentation on 09/23/21 for the exam, diagnosis, procedures, and orders are all accurate and complete.   Fritzi Mandes [place],acting as a Education administrator for Gwyneth Sprout, FNP.,have documented all relevant documentation on the behalf of Gwyneth Sprout, FNP,as directed by  Gwyneth Sprout, FNP while in the presence of Gwyneth Sprout, FNP.   Gwyneth Sprout, [place] [phone] (phone) [phone] (fax)  [place]

## 2021-09-23 NOTE — Assessment & Plan Note (Signed)
On statin; recommend LDL at 100 given pre-dm dx ?We recommend diet low in saturated fat and regular exercise - 30 min at least 5 times per week ? ?

## 2021-09-23 NOTE — Assessment & Plan Note (Signed)
F/b GI- [facility] ?Started on [employer] [place] [place] [employer], working on diet modification ?Was previously on dexilant with carafate ?Denies esophagitis ? ?

## 2021-09-23 NOTE — Assessment & Plan Note (Signed)
Mixed hyperlipidemia ?Encouraged low fat diet, pt has joined gym ?Working on PT exercises for hx of sciatica and chronic back pain- is improving ?Denies complications with stain use ?Chronic, well controlled ?

## 2021-09-23 NOTE — Assessment & Plan Note (Signed)
Chronic, stable- 124/69 ?Denies CP ?Denies SOB ?Denies DOE ?No LE Edema noted on exam ?Continue medications- norvasc 10mg  ?Refills stable ?Seek emergent care if you develop CP, chest pain or chest pressure ? ?

## 2021-09-28 ENCOUNTER — Other Ambulatory Visit: Payer: Self-pay | Admitting: Family Medicine

## 2021-09-28 DIAGNOSIS — E7800 Pure hypercholesterolemia, unspecified: Secondary | ICD-10-CM

## 2021-09-28 DIAGNOSIS — K2100 Gastro-esophageal reflux disease with esophagitis, without bleeding: Secondary | ICD-10-CM

## 2021-09-28 NOTE — Telephone Encounter (Signed)
Requested medications are due for refill today.  no ? ?Requested medications are on the active medications list.  no ? ?Last refill. 08/31/2020 #360 0 refills ? ?Future visit scheduled.   no ? ?Notes to clinic.  Medication was discontinued 09/23/2021 ? ? ? ?Requested Prescriptions  ?Pending Prescriptions Disp Refills  ? sucralfate (CARAFATE) 1 g tablet [Pharmacy Med Name: SUCRALFATE 1GM TABLETS] 360 tablet 0  ?  Sig: TAKE 1 TABLET(1 GRAM) BY MOUTH FOUR TIMES DAILY AT BEDTIME WITH MEALS  ?  ? Gastroenterology: Antiacids Passed - 09/28/2021  6:28 AM  ?  ?  Passed - Valid encounter within last 12 months  ?  Recent Outpatient Visits   ? ?      ? 5 days ago Prediabetes  ? [facility] Tally Joe T, FNP  ? 1 month ago Bilateral low back pain with right-sided sciatica, unspecified chronicity  ? [place], DO  ? 6 months ago Encounter for annual wellness visit (AWV) in Medicare patient  ? [facility] [place], Dionne Bucy, MD  ? 10 months ago Pain of left heel  ? [employer] Just, Laurita Quint, FNP  ? 1 year ago Right low back pain, unspecified chronicity, unspecified whether sciatica present  ? [place], PA-C  ? ?  ?  ? ?  ?  ?  ?Signed Prescriptions Disp Refills  ? atorvastatin (LIPITOR) 40 MG tablet 90 tablet 1  ?  Sig: TAKE 1 TABLET(40 MG) BY MOUTH DAILY  ?  ? Cardiovascular:  Antilipid - Statins Failed - 09/28/2021  6:28 AM  ?  ?  Failed - Lipid Panel in normal range within the last 12 months  ?  Cholesterol, Total  ?Date Value Ref Range Status  ?03/26/2021 158 100 - 199 mg/dL Final  ? ?LDL Chol Calc (NIH)  ?Date Value Ref Range Status  ?03/26/2021 86 0 - 99 mg/dL Final  ? ?HDL  ?Date Value Ref Range Status  ?03/26/2021 50 >39 mg/dL Final  ? ?Triglycerides  ?Date Value Ref Range Status  ?03/26/2021 123 0 - 149 mg/dL Final  ? ?  ?  ?  Passed - Patient is not pregnant  ?  ?  Passed - Valid encounter within last 12  months  ?  Recent Outpatient Visits   ? ?      ? 5 days ago Prediabetes  ? [facility] Tally Joe T, FNP  ? 1 month ago Bilateral low back pain with right-sided sciatica, unspecified chronicity  ? [place], DO  ? 6 months ago Encounter for annual wellness visit (AWV) in Medicare patient  ? [facility] [place], Dionne Bucy, MD  ? 10 months ago Pain of left heel  ? [employer] Just, Laurita Quint, FNP  ? 1 year ago Right low back pain, unspecified chronicity, unspecified whether sciatica present  ? [place], PA-C  ? ?  ?  ? ?  ?  ?  ?  ?

## 2021-09-28 NOTE — Telephone Encounter (Signed)
Duplicate request

## 2021-09-28 NOTE — Telephone Encounter (Signed)
Requested Prescriptions  ?Pending Prescriptions Disp Refills  ?? sucralfate (CARAFATE) 1 g tablet [Pharmacy Med Name: SUCRALFATE 1GM TABLETS] 360 tablet 0  ?  Sig: TAKE 1 TABLET(1 GRAM) BY MOUTH FOUR TIMES DAILY AT BEDTIME WITH MEALS  ?  ? Gastroenterology: Antiacids Passed - 09/28/2021  6:28 AM  ?  ?  Passed - Valid encounter within last 12 months  ?  Recent Outpatient Visits   ?      ? 5 days ago Prediabetes  ? [facility] Tally Joe T, FNP  ? 1 month ago Bilateral low back pain with right-sided sciatica, unspecified chronicity  ? [place], DO  ? 6 months ago Encounter for annual wellness visit (AWV) in Medicare patient  ? [facility] [place], Dionne Bucy, MD  ? 10 months ago Pain of left heel  ? [employer] Just, Laurita Quint, FNP  ? 1 year ago Right low back pain, unspecified chronicity, unspecified whether sciatica present  ? [place], PA-C  ?  ?  ? ?  ?  ?  ?? atorvastatin (LIPITOR) 40 MG tablet [Pharmacy Med Name: ATORVASTATIN '40MG'$  TABLETS] 90 tablet 1  ?  Sig: TAKE 1 TABLET(40 MG) BY MOUTH DAILY  ?  ? Cardiovascular:  Antilipid - Statins Failed - 09/28/2021  6:28 AM  ?  ?  Failed - Lipid Panel in normal range within the last 12 months  ?  Cholesterol, Total  ?Date Value Ref Range Status  ?03/26/2021 158 100 - 199 mg/dL Final  ? ?LDL Chol Calc (NIH)  ?Date Value Ref Range Status  ?03/26/2021 86 0 - 99 mg/dL Final  ? ?HDL  ?Date Value Ref Range Status  ?03/26/2021 50 >39 mg/dL Final  ? ?Triglycerides  ?Date Value Ref Range Status  ?03/26/2021 123 0 - 149 mg/dL Final  ? ?  ?  ?  Passed - Patient is not pregnant  ?  ?  Passed - Valid encounter within last 12 months  ?  Recent Outpatient Visits   ?      ? 5 days ago Prediabetes  ? [facility] Tally Joe T, FNP  ? 1 month ago Bilateral low back pain with right-sided sciatica, unspecified chronicity  ? [place], DO  ? 6 months ago Encounter for annual wellness visit (AWV) in Medicare patient  ? [facility] [place], Dionne Bucy, MD  ? 10 months ago Pain of left heel  ? [employer] Just, Laurita Quint, FNP  ? 1 year ago Right low back pain, unspecified chronicity, unspecified whether sciatica present  ? [place], PA-C  ?  ?  ? ?  ?  ?  ? ?

## 2021-11-09 ENCOUNTER — Telehealth: Payer: Self-pay | Admitting: Gastroenterology

## 2021-11-09 NOTE — Telephone Encounter (Signed)
Pt is requesting a presription for a medication that she had samples of Pantoprazole 40 mg  ?[place] ?

## 2021-11-09 NOTE — Telephone Encounter (Signed)
error 

## 2021-11-25 ENCOUNTER — Other Ambulatory Visit: Payer: Self-pay | Admitting: Family Medicine

## 2021-11-25 DIAGNOSIS — I10 Essential (primary) hypertension: Secondary | ICD-10-CM

## 2021-11-25 MED ORDER — PANTOPRAZOLE SODIUM 40 MG PO TBEC: 40.0000 mg | DELAYED_RELEASE_TABLET | Freq: Every day | ORAL | 2 refills | Status: DC

## 2021-11-25 NOTE — Telephone Encounter (Signed)
Pt reports she is doing well on Pantoprazole daily... Rx sent through e-scribe ?Pt advised to let us know if anything changes  ?

## 2021-11-25 NOTE — Addendum Note (Signed)
Addended by: Lurlean Nanny on: 11/25/2021 10:37 AM ? ? Modules accepted: Orders ? ?

## 2021-11-25 NOTE — Telephone Encounter (Signed)
Patient calling to get prescription (Pantoprazole '40MG'$ ) refilled. Patient is out of this medication, she states she took her last pill last night. Patient also states that she has called multiple times and left messages.  ? ?[facility] on [address] ?[place], [place] ?

## 2021-11-25 NOTE — Addendum Note (Signed)
Addended by: Lurlean Nanny on: 11/25/2021 09:40 AM ? ? Modules accepted: Orders ? ?

## 2021-11-25 NOTE — Telephone Encounter (Signed)
Left detailed msg on VM per HIPAA  

## 2021-11-25 NOTE — Telephone Encounter (Signed)
Requested Prescriptions  ?Pending Prescriptions Disp Refills  ?? amLODipine (NORVASC) 10 MG tablet [Pharmacy Med Name: AMLODIPINE BESYLATE '10MG'$  TABLETS] 90 tablet 0  ?  Sig: TAKE 1 TABLET(10 MG) BY MOUTH DAILY  ?  ? Cardiovascular: Calcium Channel Blockers 2 Passed - 11/25/2021 12:07 PM  ?  ?  Passed - Last BP in normal range  ?  BP Readings from Last 1 Encounters:  ?09/23/21 124/69  ?   ?  ?  Passed - Last Heart Rate in normal range  ?  Pulse Readings from Last 1 Encounters:  ?09/23/21 83  ?   ?  ?  Passed - Valid encounter within last 6 months  ?  Recent Outpatient Visits   ?      ? 2 months ago Prediabetes  ? [facility] Tally Joe T, FNP  ? 3 months ago Bilateral low back pain with right-sided sciatica, unspecified chronicity  ? [place], DO  ? 8 months ago Encounter for annual wellness visit (AWV) in Medicare patient  ? [facility] [place], Dionne Bucy, MD  ? 1 year ago Pain of left heel  ? [employer] Just, Laurita Quint, FNP  ? 1 year ago Right low back pain, unspecified chronicity, unspecified whether sciatica present  ? [place], PA-C  ?  ?  ? ?  ?  ?  ? ?

## 2021-12-25 ENCOUNTER — Other Ambulatory Visit: Payer: Self-pay | Admitting: Family Medicine

## 2021-12-25 DIAGNOSIS — I10 Essential (primary) hypertension: Secondary | ICD-10-CM

## 2022-02-07 ENCOUNTER — Telehealth: Payer: Self-pay | Admitting: Family Medicine

## 2022-02-07 NOTE — Telephone Encounter (Signed)
Dexilant 60 MG 1xdaily  refill request

## 2022-02-07 NOTE — Telephone Encounter (Signed)
Script has not been on active medication list in over a year. Marland Kitchen

## 2022-02-08 ENCOUNTER — Telehealth: Payer: Self-pay | Admitting: Family Medicine

## 2022-02-08 DIAGNOSIS — J301 Allergic rhinitis due to pollen: Secondary | ICD-10-CM

## 2022-02-08 MED ORDER — FLUTICASONE PROPIONATE 50 MCG/ACT NA SUSP: 2.0000 | Freq: Every day | NASAL | 5 refills | Status: DC

## 2022-02-08 NOTE — Telephone Encounter (Signed)
[facility] faxed refill request for the following medications:  fluticasone (FLONASE) 50 MCG/ACT nasal spray   albuterol (VENTOLIN HFA) 108 (90 Base) MCG/ACT inhaler    atorvastatin (LIPITOR) 40 MG tablet   Please advise.

## 2022-02-10 ENCOUNTER — Telehealth: Payer: Self-pay | Admitting: Family Medicine

## 2022-02-10 NOTE — Telephone Encounter (Signed)
[facility] faxed refill request for the following medications:  albuterol (VENTOLIN HFA) 108 (90 Base) MCG/ACT inhaler  atorvastatin (LIPITOR) 40 MG tablet  amLODipine (NORVASC) 10 MG tablet  clopidogrel (PLAVIX) 75 MG tablet   Please advise.

## 2022-02-11 ENCOUNTER — Other Ambulatory Visit: Payer: Self-pay

## 2022-02-11 DIAGNOSIS — E7800 Pure hypercholesterolemia, unspecified: Secondary | ICD-10-CM

## 2022-02-11 DIAGNOSIS — Z8673 Personal history of transient ischemic attack (TIA), and cerebral infarction without residual deficits: Secondary | ICD-10-CM

## 2022-02-11 DIAGNOSIS — I10 Essential (primary) hypertension: Secondary | ICD-10-CM

## 2022-02-11 MED ORDER — ALBUTEROL SULFATE HFA 108 (90 BASE) MCG/ACT IN AERS: 2.0000 | INHALATION_SPRAY | Freq: Four times a day (QID) | RESPIRATORY_TRACT | 0 refills | Status: DC | PRN

## 2022-02-11 MED ORDER — AMLODIPINE BESYLATE 10 MG PO TABS: ORAL_TABLET | ORAL | 0 refills | Status: DC

## 2022-02-11 MED ORDER — CLOPIDOGREL BISULFATE 75 MG PO TABS: ORAL_TABLET | ORAL | 3 refills | Status: DC

## 2022-02-11 MED ORDER — ATORVASTATIN CALCIUM 40 MG PO TABS: 40.0000 mg | ORAL_TABLET | Freq: Every day | ORAL | 1 refills | Status: DC

## 2022-02-14 ENCOUNTER — Other Ambulatory Visit: Payer: Self-pay | Admitting: Family Medicine

## 2022-02-14 MED ORDER — DEXLANSOPRAZOLE 60 MG PO CPDR: 60.0000 mg | DELAYED_RELEASE_CAPSULE | Freq: Every day | ORAL | 0 refills | Status: DC

## 2022-02-16 ENCOUNTER — Encounter: Payer: Self-pay | Admitting: Physician Assistant

## 2022-02-16 ENCOUNTER — Ambulatory Visit (INDEPENDENT_AMBULATORY_CARE_PROVIDER_SITE_OTHER): Payer: Medicare Other | Admitting: Physician Assistant

## 2022-02-16 VITALS — BP 141/64 | HR 68 | Temp 97.9°F | Ht 65.0 in | Wt 169.4 lb

## 2022-02-16 DIAGNOSIS — R2232 Localized swelling, mass and lump, left upper limb: Secondary | ICD-10-CM

## 2022-02-16 DIAGNOSIS — J069 Acute upper respiratory infection, unspecified: Secondary | ICD-10-CM | POA: Diagnosis not present

## 2022-02-16 MED ORDER — LORATADINE 10 MG PO TABS: 10.0000 mg | ORAL_TABLET | Freq: Every day | ORAL | 1 refills | Status: DC

## 2022-02-16 NOTE — Progress Notes (Signed)
I,Sha'taria Tyson,acting as a Education administrator for [employer], PA-C.,have documented all relevant documentation on the behalf of [name], PA-C,as directed by  [name], PA-C while in the presence of [name], PA-C.  Established patient visit   Patient: [name]   DOB: Mar 25, 1956   66 y.o. Female  MRN: [id number] Visit Date: 02/16/2022  Today's healthcare provider: [name], PA-C   Cc. Sore throat, dizziness.  Subjective    HPI   Pt presents today with 5 days of fatigue, dizziness, sore throat, nasal congestion, headache. Denies fever. Reports negative home COVID test. Describes dizziness as being unsteady, denies room spinning. She has been only taking tylenol OTC for symptom relief.   She also reports a long -standing lump on her L forearm that is growing. Sometimes painful/tender.  Medications: Outpatient Medications Prior to Visit  Medication Sig   albuterol (VENTOLIN HFA) 108 (90 Base) MCG/ACT inhaler Inhale 2 puffs into the lungs every 6 (six) hours as needed for wheezing or shortness of breath.   amLODipine (NORVASC) 10 MG tablet TAKE 1 TABLET(10 MG) BY MOUTH DAILY   atorvastatin (LIPITOR) 40 MG tablet Take 1 tablet (40 mg total) by mouth daily.   Cholecalciferol (VITAMIN D) 2000 UNITS tablet Take 2,000 Units by mouth daily.   clopidogrel (PLAVIX) 75 MG tablet TAKE 1 TABLET(75 MG) BY MOUTH DAILY   cyclobenzaprine (FLEXERIL) 10 MG tablet TAKE 1 TABLET(10 MG) BY MOUTH AT BEDTIME   fluticasone (FLONASE) 50 MCG/ACT nasal spray Place 2 sprays into both nostrils daily.   Omega-3 Fatty Acids (FISH OIL) 1200 MG CAPS Take 1 capsule by mouth daily.   potassium chloride (KLOR-CON) 10 MEQ tablet Take 10 mEq by mouth daily. M-F only   dexlansoprazole (DEXILANT) 60 MG capsule Take 1 capsule (60 mg total) by mouth daily. (Patient not taking: Reported on 02/16/2022)   ferrous sulfate 325 (65 FE) MG tablet Take 1 tablet (325 mg total) by mouth daily with  breakfast.   [DISCONTINUED] azelastine (OPTIVAR) 0.05 % ophthalmic solution Place 1 drop into both eyes 2 (two) times daily.   [DISCONTINUED] loratadine (CLARITIN) 10 MG tablet Take 1 tablet (10 mg total) by mouth daily.   No facility-administered medications prior to visit.    Review of Systems  Constitutional:  Positive for activity change and fatigue. Negative for fever.  HENT:  Positive for congestion.   Respiratory:  Negative for cough and shortness of breath.   Cardiovascular:  Negative for chest pain and leg swelling.  Gastrointestinal:  Negative for abdominal pain.  Neurological:  Positive for dizziness. Negative for headaches.     Objective    BP (!) 141/64 (BP Location: Left Arm, Patient Position: Sitting, Cuff Size: Normal)   Pulse 68   Temp 97.9 F (36.6 C) (Oral)   Ht '5\' 5"'$  (1.651 m)   Wt 169 lb 6.4 oz (76.8 kg)   SpO2 98%   BMI 28.19 kg/m   Physical Exam Constitutional:      General: She is awake.     Appearance: She is well-developed.  HENT:     Head: Normocephalic.     Right Ear: Tympanic membrane normal.     Left Ear: Tympanic membrane normal.     Nose: Congestion and rhinorrhea present.     Mouth/Throat:     Mouth: Mucous membranes are dry.     Pharynx: Posterior oropharyngeal erythema present. No oropharyngeal exudate.  Eyes:     Conjunctiva/sclera: Conjunctivae normal.  Cardiovascular:  Rate and Rhythm: Normal rate and regular rhythm.     Heart sounds: Normal heart sounds.  Pulmonary:     Effort: Pulmonary effort is normal.     Breath sounds: Normal breath sounds.  Skin:    General: Skin is warm.  Neurological:     Mental Status: She is alert and oriented to person, place, and time.  Psychiatric:        Attention and Perception: Attention normal.        Mood and Affect: Mood normal.        Speech: Speech normal.        Behavior: Behavior is cooperative.      No results found for any visits on 02/16/22.  Assessment & Plan      URI -Dry mucous membranes. Advised increasing fluids, add in Gatorade/Pedialyte. Will check cbc/cmp d/t level of fatigue and possible dehydration. -advised tylenol, claritin, flonase otc.  2. L arm mass Lipoma ? Pt states painful and growing, ordered ultrasound  Return if symptoms worsen or fail to improve.      I, [name], PA-C have reviewed all documentation for this visit. The documentation on  02/16/2022 for the exam, diagnosis, procedures, and orders are all accurate and complete.  [name], PA-C [facility] [address] [place], [place], [address] Office: [phone] Fax: [place]

## 2022-02-17 LAB — CBC WITH DIFFERENTIAL/PLATELET
Basophils Absolute: 0 10*3/uL (ref 0.0–0.2)
Basos: 0 %
EOS (ABSOLUTE): 0.1 10*3/uL (ref 0.0–0.4)
Eos: 1 %
Hematocrit: 43.8 % (ref 34.0–46.6)
Hemoglobin: 14.5 g/dL (ref 11.1–15.9)
Immature Grans (Abs): 0 10*3/uL (ref 0.0–0.1)
Immature Granulocytes: 0 %
Lymphocytes Absolute: 2.8 10*3/uL (ref 0.7–3.1)
Lymphs: 33 %
MCH: 28.2 pg (ref 26.6–33.0)
MCHC: 33.1 g/dL (ref 31.5–35.7)
MCV: 85 fL (ref 79–97)
Monocytes Absolute: 0.6 10*3/uL (ref 0.1–0.9)
Monocytes: 7 %
Neutrophils Absolute: 4.9 10*3/uL (ref 1.4–7.0)
Neutrophils: 59 %
Platelets: 337 10*3/uL (ref 150–450)
RBC: 5.14 x10E6/uL (ref 3.77–5.28)
RDW: 13.5 % (ref 11.7–15.4)
WBC: 8.5 10*3/uL (ref 3.4–10.8)

## 2022-02-17 LAB — COMPREHENSIVE METABOLIC PANEL
ALT: 5 IU/L (ref 0–32)
AST: 13 IU/L (ref 0–40)
Albumin/Globulin Ratio: 1.4 (ref 1.2–2.2)
Albumin: 4.3 g/dL (ref 3.9–4.9)
Alkaline Phosphatase: 113 IU/L (ref 44–121)
BUN/Creatinine Ratio: 12 (ref 12–28)
BUN: 10 mg/dL (ref 8–27)
Bilirubin Total: 0.3 mg/dL (ref 0.0–1.2)
CO2: 24 mmol/L (ref 20–29)
Calcium: 9.8 mg/dL (ref 8.7–10.3)
Chloride: 103 mmol/L (ref 96–106)
Creatinine, Ser: 0.81 mg/dL (ref 0.57–1.00)
Globulin, Total: 3.1 g/dL (ref 1.5–4.5)
Glucose: 116 mg/dL — ABNORMAL HIGH (ref 70–99)
Potassium: 3.5 mmol/L (ref 3.5–5.2)
Sodium: 145 mmol/L — ABNORMAL HIGH (ref 134–144)
Total Protein: 7.4 g/dL (ref 6.0–8.5)
eGFR: 81 mL/min/{1.73_m2} (ref >59)

## 2022-02-22 ENCOUNTER — Ambulatory Visit
Admission: RE | Admit: 2022-02-22 | Discharge: 2022-02-22 | Disposition: A | Discharge status: Home / Self Care | Payer: Medicare Other | Source: Ambulatory Visit | Attending: Physician Assistant | Admitting: Physician Assistant

## 2022-02-22 DIAGNOSIS — R2232 Localized swelling, mass and lump, left upper limb: Secondary | ICD-10-CM | POA: Diagnosis not present

## 2022-02-22 DIAGNOSIS — M7989 Other specified soft tissue disorders: Secondary | ICD-10-CM | POA: Diagnosis not present

## 2022-02-24 ENCOUNTER — Other Ambulatory Visit: Payer: Self-pay | Admitting: Physician Assistant

## 2022-02-24 DIAGNOSIS — D1722 Benign lipomatous neoplasm of skin and subcutaneous tissue of left arm: Secondary | ICD-10-CM

## 2022-03-02 ENCOUNTER — Ambulatory Visit (INDEPENDENT_AMBULATORY_CARE_PROVIDER_SITE_OTHER): Payer: Medicare Other | Admitting: Family Medicine

## 2022-03-02 ENCOUNTER — Encounter: Payer: Self-pay | Admitting: Family Medicine

## 2022-03-02 VITALS — BP 138/73 | HR 68 | Temp 98.2°F | Resp 16 | Ht 65.0 in | Wt 173.0 lb

## 2022-03-02 DIAGNOSIS — R599 Enlarged lymph nodes, unspecified: Secondary | ICD-10-CM | POA: Diagnosis not present

## 2022-03-02 DIAGNOSIS — J029 Acute pharyngitis, unspecified: Secondary | ICD-10-CM | POA: Diagnosis not present

## 2022-03-02 DIAGNOSIS — J069 Acute upper respiratory infection, unspecified: Secondary | ICD-10-CM | POA: Insufficient documentation

## 2022-03-02 LAB — POCT RAPID STREP A (OFFICE): Rapid Strep A Screen: NEGATIVE

## 2022-03-02 MED ORDER — PREDNISONE 10 MG (21) PO TBPK: ORAL_TABLET | ORAL | 0 refills | Status: DC

## 2022-03-02 MED ORDER — AMOXICILLIN-POT CLAVULANATE 875-125 MG PO TABS: 1.0000 | ORAL_TABLET | Freq: Two times a day (BID) | ORAL | 0 refills | Status: DC

## 2022-03-02 NOTE — Assessment & Plan Note (Signed)
Pt report R interior glands swollen, has been choking her However, no weight loss/weight change  Denies vomiting Denies sick contacts OTC Rx includes APAP only

## 2022-03-02 NOTE — Assessment & Plan Note (Signed)
Seen by Thedore Mins PA with supportive care; pt continues to have complaints- going on 3 weeks Denies allergies Home COVID tests negative Recommend ABX at this time with f/u in 2 weeks with referral to ENT if symptoms remain

## 2022-03-02 NOTE — Assessment & Plan Note (Signed)
Negative strep, rapid; normal exam

## 2022-03-02 NOTE — Progress Notes (Signed)
Established patient visit   Patient: [name]   DOB: 07/25/1956   66 y.o. Female  MRN: [id number] Visit Date: 03/02/2022  Today's healthcare provider: Gwyneth Sprout, FNP  Introduced to nurse practitioner role and practice setting.  All questions answered.  Discussed provider/patient relationship and expectations.  Chief Complaint  Patient presents with   Sore Throat   Subjective    Sore Throat  This is a new problem. The current episode started yesterday. The problem has been unchanged. The pain is worse on the right side. There has been no fever. The pain is mild. Associated symptoms include headaches and swollen glands (right side). Pertinent negatives include no congestion, coughing, ear discharge, ear pain, shortness of breath, trouble swallowing or vomiting. She has had no exposure to strep. She has tried gargles (tea) for the symptoms. The treatment provided no relief.     Medications: Outpatient Medications Prior to Visit  Medication Sig   albuterol (VENTOLIN HFA) 108 (90 Base) MCG/ACT inhaler Inhale 2 puffs into the lungs every 6 (six) hours as needed for wheezing or shortness of breath.   amLODipine (NORVASC) 10 MG tablet TAKE 1 TABLET(10 MG) BY MOUTH DAILY   atorvastatin (LIPITOR) 40 MG tablet Take 1 tablet (40 mg total) by mouth daily.   Cholecalciferol (VITAMIN D) 2000 UNITS tablet Take 2,000 Units by mouth daily.   clopidogrel (PLAVIX) 75 MG tablet TAKE 1 TABLET(75 MG) BY MOUTH DAILY   cyclobenzaprine (FLEXERIL) 10 MG tablet TAKE 1 TABLET(10 MG) BY MOUTH AT BEDTIME   fluticasone (FLONASE) 50 MCG/ACT nasal spray Place 2 sprays into both nostrils daily.   loratadine (CLARITIN) 10 MG tablet Take 1 tablet (10 mg total) by mouth daily.   Omega-3 Fatty Acids (FISH OIL) 1200 MG CAPS Take 1 capsule by mouth daily.   potassium chloride (KLOR-CON) 10 MEQ tablet Take 10 mEq by mouth daily. M-F only   dexlansoprazole (DEXILANT) 60 MG capsule Take 1 capsule (60 mg total)  by mouth daily. (Patient not taking: Reported on 02/16/2022)   ferrous sulfate 325 (65 FE) MG tablet Take 1 tablet (325 mg total) by mouth daily with breakfast.   No facility-administered medications prior to visit.    Review of Systems  HENT:  Negative for congestion, ear discharge, ear pain and trouble swallowing.   Respiratory:  Negative for cough and shortness of breath.   Gastrointestinal:  Negative for vomiting.  Neurological:  Positive for headaches.    Last CBC Lab Results  Component Value Date   WBC 8.5 02/16/2022   HGB 14.5 02/16/2022   HCT 43.8 02/16/2022   MCV 85 02/16/2022   MCH 28.2 02/16/2022   RDW 13.5 02/16/2022   PLT 337 52/84/1324   Last metabolic panel Lab Results  Component Value Date   GLUCOSE 116 (H) 02/16/2022   NA 145 (H) 02/16/2022   K 3.5 02/16/2022   CL 103 02/16/2022   CO2 24 02/16/2022   BUN 10 02/16/2022   CREATININE 0.81 02/16/2022   EGFR 81 02/16/2022   CALCIUM 9.8 02/16/2022   PROT 7.4 02/16/2022   ALBUMIN 4.3 02/16/2022   LABGLOB 3.1 02/16/2022   AGRATIO 1.4 02/16/2022   BILITOT 0.3 02/16/2022   ALKPHOS 113 02/16/2022   AST 13 02/16/2022   ALT 5 02/16/2022   ANIONGAP 10 02/24/2020   Last lipids Lab Results  Component Value Date   CHOL 158 03/26/2021   HDL 50 03/26/2021   LDLCALC 86 03/26/2021  TRIG 123 03/26/2021   CHOLHDL 3.8 03/19/2020   Last hemoglobin A1c Lab Results  Component Value Date   HGBA1C 6.0 (A) 09/23/2021   Last thyroid functions Lab Results  Component Value Date   TSH 0.577 03/26/2021   Last vitamin D Lab Results  Component Value Date   VD25OH 37.9 03/26/2021   Last vitamin B12 and Folate No results found for: "VITAMINB12", "FOLATE"     Objective    BP 138/73 (BP Location: Right Arm, Patient Position: Sitting, Cuff Size: Normal)   Pulse 68   Temp 98.2 F (36.8 C) (Oral)   Resp 16   Ht $R'5\' 5"'Jz$  (1.651 m)   Wt 173 lb (78.5 kg)   BMI 28.79 kg/m  BP Readings from Last 3 Encounters:   03/02/22 138/73  02/16/22 (!) 141/64  09/23/21 124/69   Wt Readings from Last 3 Encounters:  03/02/22 173 lb (78.5 kg)  02/16/22 169 lb 6.4 oz (76.8 kg)  09/23/21 174 lb (78.9 kg)   SpO2 Readings from Last 3 Encounters:  02/16/22 98%  09/23/21 99%  08/25/21 99%      Physical Exam Vitals and nursing note reviewed.  Constitutional:      General: She is not in acute distress.    Appearance: Normal appearance. She is well-developed and overweight. She is not ill-appearing, toxic-appearing or diaphoretic.  HENT:     Head: Normocephalic and atraumatic.     Right Ear: Tympanic membrane and ear canal normal.     Left Ear: Tympanic membrane and ear canal normal.     Nose: No congestion or rhinorrhea.     Mouth/Throat:     Mouth: Mucous membranes are moist. No oral lesions.     Pharynx: Oropharynx is clear. Uvula midline. No pharyngeal swelling, oropharyngeal exudate, posterior oropharyngeal erythema or uvula swelling.     Tonsils: No tonsillar exudate or tonsillar abscesses. 0 on the right. 0 on the left.     Comments: Pt endorses R side swelling; not noted on exam  Pt endorses pain; Rapid strep negative, no exudate on exam  Eyes:     Conjunctiva/sclera: Conjunctivae normal.     Pupils: Pupils are equal, round, and reactive to light.  Neck:     Thyroid: No thyromegaly.  Cardiovascular:     Rate and Rhythm: Normal rate and regular rhythm.     Pulses: Normal pulses.     Heart sounds: Normal heart sounds. No murmur heard.    No friction rub. No gallop.  Pulmonary:     Effort: Pulmonary effort is normal. No respiratory distress.     Breath sounds: Normal breath sounds. No stridor. No wheezing, rhonchi or rales.  Chest:     Chest wall: No tenderness.  Abdominal:     General: Bowel sounds are normal.     Palpations: Abdomen is soft.  Musculoskeletal:        General: No swelling, tenderness, deformity or signs of injury. Normal range of motion.     Cervical back: Normal range of  motion and neck supple.     Right lower leg: No edema.     Left lower leg: No edema.  Lymphadenopathy:     Cervical: No cervical adenopathy.  Skin:    General: Skin is warm and dry.     Capillary Refill: Capillary refill takes less than 2 seconds.     Coloration: Skin is not jaundiced or pale.     Findings: No bruising, erythema, lesion or rash.  Neurological:  General: No focal deficit present.     Mental Status: She is alert and oriented to person, place, and time. Mental status is at baseline.     Cranial Nerves: No cranial nerve deficit.     Sensory: No sensory deficit.     Motor: No weakness.     Coordination: Coordination normal.  Psychiatric:        Mood and Affect: Mood normal.        Behavior: Behavior normal.        Thought Content: Thought content normal.        Judgment: Judgment normal.     No results found for any visits on 03/02/22.  Assessment & Plan     Problem List Items Addressed This Visit       Immune and Lymphatic   Glands swollen    Pt report R interior glands swollen, has been choking her However, no weight loss/weight change  Denies vomiting Denies sick contacts OTC Rx includes APAP only       Relevant Medications   amoxicillin-clavulanate (AUGMENTIN) 875-125 MG tablet   predniSONE (STERAPRED UNI-PAK 21 TAB) 10 MG (21) TBPK tablet     Other   Recent URI    Seen by [place] PA with supportive care; pt continues to have complaints- going on 3 weeks Denies allergies Home COVID tests negative Recommend ABX at this time with f/u in 2 weeks with referral to ENT if symptoms remain       Relevant Medications   amoxicillin-clavulanate (AUGMENTIN) 875-125 MG tablet   predniSONE (STERAPRED UNI-PAK 21 TAB) 10 MG (21) TBPK tablet   Sorethroat - Primary    Negative strep, rapid; normal exam       Relevant Medications   amoxicillin-clavulanate (AUGMENTIN) 875-125 MG tablet   predniSONE (STERAPRED UNI-PAK 21 TAB) 10 MG (21) TBPK tablet   Other  Relevant Orders   POCT rapid strep A     Return in about 2 weeks (around 03/16/2022), or if symptoms worsen or fail to improve.      Vonna Kotyk, FNP, have reviewed all documentation for this visit. The documentation on 03/02/22 for the exam, diagnosis, procedures, and orders are all accurate and complete.    Gwyneth Sprout, [place] [phone] (phone) [phone] (fax)  [address]

## 2022-03-10 NOTE — Progress Notes (Deleted)
      Established patient visit   Patient: [name]   DOB: 31-Aug-1955   66 y.o. Female  MRN: [id number] Visit Date: 03/16/2022  Today's healthcare provider: Gwyneth Sprout, FNP   No chief complaint on file.  Subjective    HPI  ***  Medications: Outpatient Medications Prior to Visit  Medication Sig   albuterol (VENTOLIN HFA) 108 (90 Base) MCG/ACT inhaler Inhale 2 puffs into the lungs every 6 (six) hours as needed for wheezing or shortness of breath.   amLODipine (NORVASC) 10 MG tablet TAKE 1 TABLET(10 MG) BY MOUTH DAILY   amoxicillin-clavulanate (AUGMENTIN) 875-125 MG tablet Take 1 tablet by mouth 2 (two) times daily.   atorvastatin (LIPITOR) 40 MG tablet Take 1 tablet (40 mg total) by mouth daily.   Cholecalciferol (VITAMIN D) 2000 UNITS tablet Take 2,000 Units by mouth daily.   clopidogrel (PLAVIX) 75 MG tablet TAKE 1 TABLET(75 MG) BY MOUTH DAILY   cyclobenzaprine (FLEXERIL) 10 MG tablet TAKE 1 TABLET(10 MG) BY MOUTH AT BEDTIME   fluticasone (FLONASE) 50 MCG/ACT nasal spray Place 2 sprays into both nostrils daily.   loratadine (CLARITIN) 10 MG tablet Take 1 tablet (10 mg total) by mouth daily.   Omega-3 Fatty Acids (FISH OIL) 1200 MG CAPS Take 1 capsule by mouth daily.   potassium chloride (KLOR-CON) 10 MEQ tablet Take 10 mEq by mouth daily. M-F only   predniSONE (STERAPRED UNI-PAK 21 TAB) 10 MG (21) TBPK tablet Day 1 & 2 take 6 tablets Day 3 &4 take 5 tablets Day 5 &6 take 4 tablets Day 7 & 8 take 3 tablets Day 9 & 10 take 2 tablets Day 11 & 12 take 1 tablet Day 13 & 14 take 1/2 tablet   No facility-administered medications prior to visit.    Review of Systems  {Labs  Heme  Chem  Endocrine  Serology  Results Review (optional):23779}   Objective    There were no vitals taken for this visit. {Show previous vital signs (optional):23777}  Physical Exam  ***  No results found for any visits on 03/16/22.  Assessment & Plan     ***  No follow-ups on file.       {provider attestation***:1}   Gwyneth Sprout, [place] [phone] (phone) [phone] (fax)  [place]

## 2022-03-14 ENCOUNTER — Other Ambulatory Visit: Payer: Self-pay | Admitting: Family Medicine

## 2022-03-14 ENCOUNTER — Ambulatory Visit: Payer: Self-pay | Admitting: *Deleted

## 2022-03-14 DIAGNOSIS — B379 Candidiasis, unspecified: Secondary | ICD-10-CM

## 2022-03-14 MED ORDER — FLUCONAZOLE 150 MG PO TABS: ORAL_TABLET | ORAL | 0 refills | Status: DC

## 2022-03-14 NOTE — Telephone Encounter (Signed)
Patient called in and stated that Daneil Dan has put her on amoxicillin-clavulanate (AUGMENTIN) 875-125 MG tablet.  Patient states that she has 2 days left.  Patient states she believes she has a yeast infection because she can smell it and wants to know if she can have medication for that called in and if not what can she do for prevention.  Patient uses:  [id number] [address], [place] [facility]  Phone: [phone]  Fax: [phone]    Chief Complaint: Vaginal itching Symptoms: Severe itching, discharge over weekend. Has been on antibiotics, 2 days left. States usually occurs when on ATBs. Has not tried anything OTC "Can't afford right now."  Frequency: 3rd day Pertinent Negatives: Patient denies fever Disposition: '[]'$ ED /'[]'$ Urgent Care (no appt availability in office) / '[]'$ Appointment(In office/virtual)/ '[]'$  [facility]/ '[]'$ Home Care/ '[]'$ Refused Recommended Disposition /'[]'$ [facility]/ '[x]'$  Follow-up with PCP Additional Notes: Requesting med , please advise.  Reason for Disposition  MODERATE-SEVERE itching (i.e., interferes with school, work, or sleep)  Answer Assessment - Initial Assessment Questions 1. SYMPTOM: "What's the main symptom you're concerned about?" (e.g., pain, itching, dryness)     Itching, discharge 2. LOCATION: "Where is the  *No Answer* located?" (e.g., inside/outside, left/right)     Vaginal area 3. ONSET: "When did the  *No Answer*  start?"     Weekend 4. PAIN: "Is there any pain?" If Yes, ask: "How bad is it?" (Scale: 1-10; mild, moderate, severe)   -  MILD (1-3): Doesn't interfere with normal activities.    -  MODERATE (4-7): Interferes with normal activities (e.g., work or school) or awakens from sleep.     -  SEVERE (8-10): Excruciating pain, unable to do any normal activities.    No 5. ITCHING: "Is there any itching?" If Yes, ask: "How bad is it?" (Scale: 1-10; mild, moderate, severe)     Severe 6. CAUSE: "What do  you think is causing the discharge?" "Have you had the same problem before? What happened then?"     Yeast, on antibiotics 7. OTHER SYMPTOMS: "Do you have any other symptoms?" (e.g., fever, itching, vaginal bleeding, pain with urination, injury to genital area, vaginal foreign body)     No  Protocols used: Vaginal Symptoms-A-AH

## 2022-03-14 NOTE — Telephone Encounter (Signed)
Spoke with patient and advised

## 2022-03-16 ENCOUNTER — Telehealth: Payer: Self-pay

## 2022-03-16 ENCOUNTER — Ambulatory Visit: Payer: Medicare Other | Admitting: Family Medicine

## 2022-03-16 ENCOUNTER — Encounter: Payer: Self-pay | Admitting: Surgery

## 2022-03-16 ENCOUNTER — Ambulatory Visit (INDEPENDENT_AMBULATORY_CARE_PROVIDER_SITE_OTHER): Payer: Medicare Other | Admitting: Surgery

## 2022-03-16 ENCOUNTER — Telehealth: Payer: Self-pay | Admitting: Surgery

## 2022-03-16 VITALS — BP 154/77 | HR 59 | Temp 98.2°F | Ht 65.0 in | Wt 171.4 lb

## 2022-03-16 DIAGNOSIS — D1722 Benign lipomatous neoplasm of skin and subcutaneous tissue of left arm: Secondary | ICD-10-CM

## 2022-03-16 NOTE — H&P (View-Only) (Signed)
03/16/2022  Reason for Visit:  Left upper extremity lipoma  Requesting Provider:  [name], PA-C  History of Present Illness: [name] is a 66 y.o. female presenting for evaluation of the left upper extremity lipoma.  The patient reports that she has had this lipoma for probably at least 5 years and has noted that its been growing slowly in size.  Denies any discomfort or pain with the mass but reports that it is growing and becoming much more visible than before.  Denies any skin color changes overlying the mass and denies any prior drainage from it.  It is located in the [facility] fossa.  She had an ultrasound of the area on 02/22/2022 which showed a 5.6 x 1.3 x 4.1 cm soft tissue mass consistent with a lipoma.  I personally viewed the images and agree with the findings.  Furthermore, looking at the images, there appears to be overlying the brachial artery and in between the cephalic and basilic veins of the AC fossa.  Past Medical History: Past Medical History:  Diagnosis Date   Allergy    Anemia    takes Ferrous Sulfate daily   Arthritis    GERD (gastroesophageal reflux disease)    takes OTC med   Hyperlipidemia    Hypertension    takes Amlodipine daily   Low back pain    occasionally     Past Surgical History: Past Surgical History:  Procedure Laterality Date   ANTERIOR CERVICAL DECOMP/DISCECTOMY FUSION  10/19/2011   Procedure: ANTERIOR CERVICAL DECOMPRESSION/DISCECTOMY FUSION 1 LEVEL/HARDWARE REMOVAL;  Surgeon: Elaina Hoops, MD;  Location: [place] [facility];  Service: Neurosurgery;  Laterality: N/A;  Cervical four - five  Anterior cervical decompression fusion with redo at six - seven  Rm 33   CARPAL TUNNEL RELEASE     COLONOSCOPY  08/2014   polyps   COLONOSCOPY WITH PROPOFOL N/A 02/04/2020   Procedure: COLONOSCOPY WITH PROPOFOL;  Surgeon: [name], MD;  Location: [facility] END[facility]COPY;  Service: Endoscopy;  Laterality: N/A;   ESOPHAGOGASTRODUODENOSCOPY (EGD) WITH PROPOFOL N/A  06/23/2015   Procedure: ESOPHAGOGASTRODUODENOSCOPY (EGD) WITH PROPOFOL;  Surgeon: [name], MD;  Location: [facility] ENDOSCOPY;  Service: Endoscopy;  Laterality: N/A;   NECK SURGERY     POSTERIOR CERVICAL FUSION/FORAMINOTOMY N/A 08/26/2013   Procedure: C/4-5,C/5-6,C/6-7 Posterior Cervical Fusion w/lateral mass fixation;  Surgeon: Elaina Hoops, MD;  Location: [place] [facility];  Service: Neurosurgery;  Laterality: N/A;   TUBAL LIGATION      Home Medications: Prior to Admission medications   Medication Sig Start Date End Date Taking? Authorizing Provider  albuterol (VENTOLIN HFA) 108 (90 Base) MCG/ACT inhaler Inhale 2 puffs into the lungs every 6 (six) hours as needed for wheezing or shortness of breath. 02/11/22   Tally Joe T, FNP  amLODipine (NORVASC) 10 MG tablet TAKE 1 TABLET(10 MG) BY MOUTH DAILY 02/11/22   Gwyneth Sprout, FNP  amoxicillin-clavulanate (AUGMENTIN) 875-125 MG tablet Take 1 tablet by mouth 2 (two) times daily. 03/02/22   Gwyneth Sprout, FNP  atorvastatin (LIPITOR) 40 MG tablet Take 1 tablet (40 mg total) by mouth daily. 02/11/22   Gwyneth Sprout, FNP  Cholecalciferol (VITAMIN D) 2000 UNITS tablet Take 2,000 Units by mouth daily.    [provider]  clopidogrel (PLAVIX) 75 MG tablet TAKE 1 TABLET(75 MG) BY MOUTH DAILY 02/11/22   Tally Joe T, FNP  cyclobenzaprine (FLEXERIL) 10 MG tablet TAKE 1 TABLET(10 MG) BY MOUTH AT BEDTIME 11/06/20   Lavon Paganini  M, MD  fluconazole (DIFLUCAN) 150 MG tablet Take 1 tablet for yeast infection, PO, repeat in 4 days if symptoms remain. 03/14/22   Gwyneth Sprout, FNP  fluticasone (FLONASE) 50 MCG/ACT nasal spray Place 2 sprays into both nostrils daily. 02/08/22   Gwyneth Sprout, FNP  loratadine (CLARITIN) 10 MG tablet Take 1 tablet (10 mg total) by mouth daily. 02/16/22   [name], PA-C  Omega-3 Fatty Acids (FISH OIL) 1200 MG CAPS Take 1 capsule by mouth daily.    [provider]  potassium chloride (KLOR-CON) 10 MEQ tablet Take 10  mEq by mouth daily. M-F only    [provider]  predniSONE (STERAPRED UNI-PAK 21 TAB) 10 MG (21) TBPK tablet Day 1 & 2 take 6 tablets Day 3 &4 take 5 tablets Day 5 &6 take 4 tablets Day 7 & 8 take 3 tablets Day 9 & 10 take 2 tablets Day 11 & 12 take 1 tablet Day 13 & 14 take 1/2 tablet 03/02/22   Gwyneth Sprout, FNP    Allergies: Allergies  Allergen Reactions   Aspirin Nausea Only   Hydrochlorothiazide     Rash   Ibuprofen Other (See Comments)    Pt says her doctor doesn't want her taking this with the blood thinner she's taking   Lisinopril     Other reaction(s): Unknown   Sulfa Antibiotics Hives and Nausea Only    Social History:  reports that she has never smoked. She has never used smokeless tobacco. She reports that she does not drink alcohol and does not use drugs.   Family History: Family History  Problem Relation Age of Onset   Hypertension Mother    Heart disease Mother    Kidney failure Sister    Breast cancer Neg Hx     Review of Systems: Review of Systems  Constitutional:  Negative for chills and fever.  HENT:  Negative for hearing loss.   Respiratory:  Negative for shortness of breath.   Cardiovascular:  Negative for chest pain.  Gastrointestinal:  Negative for abdominal pain, nausea and vomiting.  Genitourinary:  Negative for dysuria.  Musculoskeletal:  Negative for myalgias.  Neurological:  Negative for dizziness.  Psychiatric/Behavioral:  Negative for depression.     Physical Exam BP (!) 154/77   Pulse (!) 59   Temp 98.2 F (36.8 C) (Oral)   Ht '5\' 5"'$  (1.651 m)   Wt 171 lb 6.4 oz (77.7 kg)   SpO2 98%   BMI 28.52 kg/m  CONSTITUTIONAL: No acute distress, well-nourished HEENT:  Normocephalic, atraumatic, extraocular motion intact. NECK: Trachea is midline, and there is no jugular venous distension.  RESPIRATORY:  Lungs are clear, and breath sounds are equal bilaterally. Normal respiratory effort without pathologic use of accessory  muscles. CARDIOVASCULAR: Heart is regular without murmurs, gallops, or rubs. MUSCULOSKELETAL:  Normal muscle strength and tone in all four extremities.  No peripheral edema or cyanosis. SKIN: The patient has an approximately 4 cm mass in the inferior portion of the AC fossa of the left upper extremity.  The mass is soft, somewhat mobile, and nontender to palpation.  There are no overlying skin changes.  Can see the highlight of cephalic and basilic veins on either side of the mass. NEUROLOGIC:  Motor and sensation is grossly normal.  Cranial nerves are grossly intact. PSYCH:  Alert and oriented to person, place and time. Affect is normal.  Laboratory Analysis: Labs from 02/16/2022: Sodium 145, potassium 3.5, chloride 103, CO2 24,  BUN 10, creatinine 0.81.  LFTs within normal.  WBC 8.5, hemoglobin 14.5, hematocrit 43.8, platelets 337.  Imaging: Ultrasound left upper extremity on 02/22/2022: FINDINGS: There is a fairly well-circumscribed isoechoic soft tissue mass in the antecubital fossa, corresponding with patient's palpable concern. This measures approximately 5.6 x 1.3 x 4.1 cm. No significant internal blood flow is seen associated with this lesion. There is no focal fluid collection or demonstrated vascular abnormality.   IMPRESSION: Nonspecific soft tissue mass in the antecubital fossa which demonstrates no aggressive sonographic characteristics. Appearance is compatible with a lipoma. Presuming this matches the clinical suspicion, recommend clinical follow-up. If clinical concern for other etiology, this could be further characterized with MRI without and with contrast.    Assessment and Plan: This is a 66 y.o. female with a left upper extremity lipoma.  - Discussed with the patient that the mass that she has is a lipoma which is a benign fatty tissue mass.  There is a mass that we can remove it typically is something that we could potentially remove in the office.  However given  the location and how it is lying between the cephalic and basilic veins and overlies the brachial artery, I think it would be more prudent to excise this mass in the operating room where we can have better exposure and control the surrounding field.  The patient is in agreement and would like to proceed with removing the mass.  Also currently has not causing any specific symptoms, she wants to remove it before it grows further or starts giving her problems. -Discussed with the patient and the plan for excision of left upper extremity lipoma I reviewed the surgery at length with her including the incision, the risks of bleeding, infection, injury to surrounding structures, that this an outpatient procedure, activity restrictions, postoperative pain control, and she is going to proceed. - Patient has a history of a TIA 5 years ago and was started on Plavix then.  We will obtain medical clearance so we can pause her Plavix.  If so, Plavix last dose will be on 03/21/2022. - We will schedule the patient for surgery on 03/29/2022.  I spent 40 minutes dedicated to the care of this patient on the date of this encounter to include pre-visit review of records, face-to-face time with the patient discussing diagnosis and management, and any post-visit coordination of care.   Melvyn Neth, [facility]

## 2022-03-16 NOTE — Telephone Encounter (Signed)
Patient has been advised of Pre-Admission date/time, and Surgery date.  Surgery Date: 03/29/22 @ [facility] Preadmission Testing Date: 03/21/22 (phone 8a-1p)  Patient has been made aware to call [phone], between 1-3:00pm the day before surgery, to find out what time to arrive for surgery.

## 2022-03-16 NOTE — Progress Notes (Signed)
03/16/2022  Reason for Visit:  Left upper extremity lipoma  Requesting Provider:  [name], PA-C  History of Present Illness: [name] is a 66 y.o. female presenting for evaluation of the left upper extremity lipoma.  The patient reports that she has had this lipoma for probably at least 5 years and has noted that its been growing slowly in size.  Denies any discomfort or pain with the mass but reports that it is growing and becoming much more visible than before.  Denies any skin color changes overlying the mass and denies any prior drainage from it.  It is located in the [facility] fossa.  She had an ultrasound of the area on 02/22/2022 which showed a 5.6 x 1.3 x 4.1 cm soft tissue mass consistent with a lipoma.  I personally viewed the images and agree with the findings.  Furthermore, looking at the images, there appears to be overlying the brachial artery and in between the cephalic and basilic veins of the AC fossa.  Past Medical History: Past Medical History:  Diagnosis Date   Allergy    Anemia    takes Ferrous Sulfate daily   Arthritis    GERD (gastroesophageal reflux disease)    takes OTC med   Hyperlipidemia    Hypertension    takes Amlodipine daily   Low back pain    occasionally     Past Surgical History: Past Surgical History:  Procedure Laterality Date   ANTERIOR CERVICAL DECOMP/DISCECTOMY FUSION  10/19/2011   Procedure: ANTERIOR CERVICAL DECOMPRESSION/DISCECTOMY FUSION 1 LEVEL/HARDWARE REMOVAL;  Surgeon: Elaina Hoops, MD;  Location: [place] [facility];  Service: Neurosurgery;  Laterality: N/A;  Cervical four - five  Anterior cervical decompression fusion with redo at six - seven  Rm 33   CARPAL TUNNEL RELEASE     COLONOSCOPY  08/2014   polyps   COLONOSCOPY WITH PROPOFOL N/A 02/04/2020   Procedure: COLONOSCOPY WITH PROPOFOL;  Surgeon: [name], MD;  Location: [facility] ENDOSCOPY;  Service: Endoscopy;  Laterality: N/A;   ESOPHAGOGASTRODUODENOSCOPY (EGD) WITH PROPOFOL N/A  06/23/2015   Procedure: ESOPHAGOGASTRODUODENOSCOPY (EGD) WITH PROPOFOL;  Surgeon: [name], MD;  Location: [facility] ENDOSCOPY;  Service: Endoscopy;  Laterality: N/A;   NECK SURGERY     POSTERIOR CERVICAL FUSION/FORAMINOTOMY N/A 08/26/2013   Procedure: C/4-5,C/5-6,C/6-7 Posterior Cervical Fusion w/lateral mass fixation;  Surgeon: Elaina Hoops, MD;  Location: [place] [facility];  Service: Neurosurgery;  Laterality: N/A;   TUBAL LIGATION      Home Medications: Prior to Admission medications   Medication Sig Start Date End Date Taking? Authorizing Provider  albuterol (VENTOLIN HFA) 108 (90 Base) MCG/ACT inhaler Inhale 2 puffs into the lungs every 6 (six) hours as needed for wheezing or shortness of breath. 02/11/22   Tally Joe T, FNP  amLODipine (NORVASC) 10 MG tablet TAKE 1 TABLET(10 MG) BY MOUTH DAILY 02/11/22   Gwyneth Sprout, FNP  amoxicillin-clavulanate (AUGMENTIN) 875-125 MG tablet Take 1 tablet by mouth 2 (two) times daily. 03/02/22   Gwyneth Sprout, FNP  atorvastatin (LIPITOR) 40 MG tablet Take 1 tablet (40 mg total) by mouth daily. 02/11/22   Gwyneth Sprout, FNP  Cholecalciferol (VITAMIN D) 2000 UNITS tablet Take 2,000 Units by mouth daily.    [provider]  clopidogrel (PLAVIX) 75 MG tablet TAKE 1 TABLET(75 MG) BY MOUTH DAILY 02/11/22   Tally Joe T, FNP  cyclobenzaprine (FLEXERIL) 10 MG tablet TAKE 1 TABLET(10 MG) BY MOUTH AT BEDTIME 11/06/20   Lavon Paganini  M, MD  fluconazole (DIFLUCAN) 150 MG tablet Take 1 tablet for yeast infection, PO, repeat in 4 days if symptoms remain. 03/14/22   Gwyneth Sprout, FNP  fluticasone (FLONASE) 50 MCG/ACT nasal spray Place 2 sprays into both nostrils daily. 02/08/22   Gwyneth Sprout, FNP  loratadine (CLARITIN) 10 MG tablet Take 1 tablet (10 mg total) by mouth daily. 02/16/22   [name], PA-C  Omega-3 Fatty Acids (FISH OIL) 1200 MG CAPS Take 1 capsule by mouth daily.    [provider]  potassium chloride (KLOR-CON) 10 MEQ tablet Take 10  mEq by mouth daily. M-F only    [provider]  predniSONE (STERAPRED UNI-PAK 21 TAB) 10 MG (21) TBPK tablet Day 1 & 2 take 6 tablets Day 3 &4 take 5 tablets Day 5 &6 take 4 tablets Day 7 & 8 take 3 tablets Day 9 & 10 take 2 tablets Day 11 & 12 take 1 tablet Day 13 & 14 take 1/2 tablet 03/02/22   Gwyneth Sprout, FNP    Allergies: Allergies  Allergen Reactions   Aspirin Nausea Only   Hydrochlorothiazide     Rash   Ibuprofen Other (See Comments)    Pt says her doctor doesn't want her taking this with the blood thinner she's taking   Lisinopril     Other reaction(s): Unknown   Sulfa Antibiotics Hives and Nausea Only    Social History:  reports that she has never smoked. She has never used smokeless tobacco. She reports that she does not drink alcohol and does not use drugs.   Family History: Family History  Problem Relation Age of Onset   Hypertension Mother    Heart disease Mother    Kidney failure Sister    Breast cancer Neg Hx     Review of Systems: Review of Systems  Constitutional:  Negative for chills and fever.  HENT:  Negative for hearing loss.   Respiratory:  Negative for shortness of breath.   Cardiovascular:  Negative for chest pain.  Gastrointestinal:  Negative for abdominal pain, nausea and vomiting.  Genitourinary:  Negative for dysuria.  Musculoskeletal:  Negative for myalgias.  Neurological:  Negative for dizziness.  Psychiatric/Behavioral:  Negative for depression.     Physical Exam BP (!) 154/77   Pulse (!) 59   Temp 98.2 F (36.8 C) (Oral)   Ht '5\' 5"'$  (1.651 m)   Wt 171 lb 6.4 oz (77.7 kg)   SpO2 98%   BMI 28.52 kg/m  CONSTITUTIONAL: No acute distress, well-nourished HEENT:  Normocephalic, atraumatic, extraocular motion intact. NECK: Trachea is midline, and there is no jugular venous distension.  RESPIRATORY:  Lungs are clear, and breath sounds are equal bilaterally. Normal respiratory effort without pathologic use of accessory  muscles. CARDIOVASCULAR: Heart is regular without murmurs, gallops, or rubs. MUSCULOSKELETAL:  Normal muscle strength and tone in all four extremities.  No peripheral edema or cyanosis. SKIN: The patient has an approximately 4 cm mass in the inferior portion of the AC fossa of the left upper extremity.  The mass is soft, somewhat mobile, and nontender to palpation.  There are no overlying skin changes.  Can see the highlight of cephalic and basilic veins on either side of the mass. NEUROLOGIC:  Motor and sensation is grossly normal.  Cranial nerves are grossly intact. PSYCH:  Alert and oriented to person, place and time. Affect is normal.  Laboratory Analysis: Labs from 02/16/2022: Sodium 145, potassium 3.5, chloride 103, CO2 24,  BUN 10, creatinine 0.81.  LFTs within normal.  WBC 8.5, hemoglobin 14.5, hematocrit 43.8, platelets 337.  Imaging: Ultrasound left upper extremity on 02/22/2022: FINDINGS: There is a fairly well-circumscribed isoechoic soft tissue mass in the antecubital fossa, corresponding with patient's palpable concern. This measures approximately 5.6 x 1.3 x 4.1 cm. No significant internal blood flow is seen associated with this lesion. There is no focal fluid collection or demonstrated vascular abnormality.   IMPRESSION: Nonspecific soft tissue mass in the antecubital fossa which demonstrates no aggressive sonographic characteristics. Appearance is compatible with a lipoma. Presuming this matches the clinical suspicion, recommend clinical follow-up. If clinical concern for other etiology, this could be further characterized with MRI without and with contrast.    Assessment and Plan: This is a 66 y.o. female with a left upper extremity lipoma.  - Discussed with the patient that the mass that she has is a lipoma which is a benign fatty tissue mass.  There is a mass that we can remove it typically is something that we could potentially remove in the office.  However given  the location and how it is lying between the cephalic and basilic veins and overlies the brachial artery, I think it would be more prudent to excise this mass in the operating room where we can have better exposure and control the surrounding field.  The patient is in agreement and would like to proceed with removing the mass.  Also currently has not causing any specific symptoms, she wants to remove it before it grows further or starts giving her problems. -Discussed with the patient and the plan for excision of left upper extremity lipoma I reviewed the surgery at length with her including the incision, the risks of bleeding, infection, injury to surrounding structures, that this an outpatient procedure, activity restrictions, postoperative pain control, and she is going to proceed. - Patient has a history of a TIA 5 years ago and was started on Plavix then.  We will obtain medical clearance so we can pause her Plavix.  If so, Plavix last dose will be on 03/21/2022. - We will schedule the patient for surgery on 03/29/2022.  I spent 40 minutes dedicated to the care of this patient on the date of this encounter to include pre-visit review of records, face-to-face time with the patient discussing diagnosis and management, and any post-visit coordination of care.   Melvyn Neth, [facility]

## 2022-03-16 NOTE — Telephone Encounter (Signed)
Faxed medical clearance to Elise Payne, FNP at [phone]. 

## 2022-03-16 NOTE — Patient Instructions (Addendum)
LAST dose of Plavix is on 03/21/2022.  Our surgery scheduler Pamala Hurry will call you within 24-48 hours to get you scheduled. If you have not heard from her after 48 hours, please call our office. Have the blue sheet available when she calls to write down important information.    If you have any concerns or questions, please feel free to call our office.   Lipoma Removal  Lipoma removal is a surgical procedure to remove a lipoma, which is a noncancerous (benign) tumor that is made up of fat cells. Most lipomas are small and painless and do not require treatment. They can form in many areas of the body but are most common under the skin of the back, arms, shoulders, buttocks, and thighs. You may need lipoma removal if you have a lipoma that is large, growing, or causing discomfort. Lipoma removal may also be done for cosmetic reasons. Tell a health care provider about: Any allergies you have. All medicines you are taking, including vitamins, herbs, eye drops, creams, and over-the-counter medicines. Any problems you or family members have had with anesthetic medicines. Any bleeding problems you have. Any surgeries you have had. Any medical conditions you have. Whether you are pregnant or may be pregnant. What are the risks? Generally, this is a safe procedure. However, problems may occur, including: Infection. Bleeding. Scarring. Allergic reactions to medicines. Damage to nearby structures or organs, such as damage to nerves or blood vessels near the lipoma. What happens before the procedure? When to Stop Eating and Drinking Follow instructions from your health care provider about what you may eat and drink before your procedure. These may include: 8 hours before your procedure Stop eating most foods. Do not eat meat, fried foods, or fatty foods. Eat only light foods, such as toast or crackers. All liquids are okay except energy drinks and alcohol. 6 hours before your procedure Stop  eating. Drink only clear liquids, such as water, clear fruit juice, black coffee, plain tea, and sports drinks. Do not drink energy drinks or alcohol. 2 hours before your procedure Stop drinking all liquids. You may be allowed to take medicines with small sips of water. If you do not follow your health care provider's instructions, your procedure may be delayed or canceled. Medicines Ask your health care provider about: Changing or stopping your regular medicines. This is especially important if you are taking diabetes medicines or blood thinners. Taking medicines such as aspirin and ibuprofen. These medicines can thin your blood. Do not take these medicines unless your health care provider tells you to take them. Taking over-the-counter medicines, vitamins, herbs, and supplements. General instructions You will have a physical exam. Your health care provider will check the size of the lipoma and whether it can be removed easily. You may have a biopsy and imaging tests, such as X-rays, a CT scan, and an MRI. Do not use any products that contain nicotine or tobacco for at least 4 weeks before the procedure. These products include cigarettes, chewing tobacco, and vaping devices, such as e-cigarettes. If you need help quitting, ask your health care provider. Ask your health care provider: How your surgery site will be marked. What steps will be taken to help prevent infection. These may include: Washing skin with a germ-killing soap. Taking antibiotic medicine. If you will be going home right after the procedure, plan to have a responsible adult: Take you home from the hospital or clinic. You will not be allowed to drive. Care for you  for the time you are told. What happens during the procedure?  An IV will be inserted into one of your veins. You will be given one or more of the following: A medicine to help you relax (sedative). A medicine to numb the area (local anesthetic). A medicine to  make you fall asleep (general anesthetic). A medicine that is injected into an area of your body to numb everything below the injection site (regional anesthetic). An incision will be made into the skin over the lipoma or very near the lipoma. The incision may be made in a natural skin line or crease. Tissues, nerves, and blood vessels near the lipoma will be moved out of the way. The lipoma and the capsule that surrounds it will be separated from the surrounding tissues. The lipoma will be removed. The incision may be closed with stitches (sutures). A bandage (dressing) will be placed over the incision. The procedure may vary among health care providers and hospitals. What happens after the procedure? Your blood pressure, heart rate, breathing rate, and blood oxygen level will be monitored until you leave the hospital or clinic. If you were prescribed an antibiotic medicine, use it as told by your health care provider. Do not stop using the antibiotic even if you start to feel better. If you were given a sedative during the procedure, it can affect you for several hours. Do not drive or operate machinery until your health care provider says that it is safe. Where to find more information OrthoInfo: orthoinfo.aaos.org Summary Before the procedure, follow instructions from your health care provider about eating and drinking, and changing or stopping your regular medicines. This is especially important if you are taking diabetes medicines or blood thinners. After the lipoma is removed, the incision may be closed with stitches (sutures) and covered with a bandage (dressing). If you were given a sedative during the procedure, it can affect you for several hours. Do not drive or operate machinery until your health care provider says that it is safe. This information is not intended to replace advice given to you by your health care provider. Make sure you discuss any questions you have with your health  care provider. Document Revised: 07/30/2021 Document Reviewed: 07/30/2021 Elsevier Patient Education  [employer].

## 2022-03-17 NOTE — Progress Notes (Unsigned)
Medical Clearance received from Tally Joe, NP. The patient is cleared at Medium risk. He is ok to stop his Plavix on 03/21/22.

## 2022-03-21 ENCOUNTER — Encounter
Admission: RE | Admit: 2022-03-21 | Discharge: 2022-03-21 | Disposition: A | Discharge status: Home / Self Care | Payer: Medicare Other | Source: Ambulatory Visit | Attending: Surgery | Admitting: Surgery

## 2022-03-21 VITALS — Ht 65.0 in | Wt 171.0 lb

## 2022-03-21 DIAGNOSIS — E7800 Pure hypercholesterolemia, unspecified: Secondary | ICD-10-CM

## 2022-03-21 DIAGNOSIS — I10 Essential (primary) hypertension: Secondary | ICD-10-CM

## 2022-03-21 HISTORY — DX: Cardiac murmur, unspecified: R01.1

## 2022-03-21 NOTE — Patient Instructions (Addendum)
Your procedure is scheduled on: Tuesday March 29, 2022. Report to Day Surgery inside [facility] 2nd floor, stop by admissions desk before getting on elevator. To find out your arrival time please call [phone] between 1PM - 3PM on Friday March 25, 2022.  Remember: Instructions that are not followed completely may result in serious medical risk,  up to and including death, or upon the discretion of your surgeon and anesthesiologist your  surgery may need to be rescheduled.     _X__ 1. Do not eat food after midnight the night before your procedure.                 No chewing gum or hard candies. You may drink clear liquids up to 2 hours                 before you are scheduled to arrive for your surgery- DO not drink clear                 liquids within 2 hours of the start of your surgery.                 Clear Liquids include:  water, apple juice without pulp, clear Gatorade, G2 or                  Gatorade Zero (avoid Red/Purple/Blue), Black Coffee or Tea (Do not add                 anything to coffee or tea).  __X__2.  On the morning of surgery brush your teeth with toothpaste and water, you                may rinse your mouth with mouthwash if you wish.  Do not swallow any toothpaste or mouthwash.     _X__ 3.  No Alcohol for 24 hours before or after surgery.   _X__ 4.  Do Not Smoke or use e-cigarettes For 24 Hours Prior to Your Surgery.                 Do not use any chewable tobacco products for at least 6 hours prior to                 Surgery.  _X__  5.  Do not use any recreational drugs (marijuana, cocaine, heroin, ecstasy, MDMA or other)                For at least one week prior to your surgery.  Combination of these drugs with anesthesia                May have life threatening results.  ____  6.  Bring all medications with you on the day of surgery if instructed.   __X__  7.  Notify your doctor if there is any change in your medical  condition      (cold, fever, infections).     Do not wear jewelry, make-up, hairpins, clips or nail polish. Do not wear lotions, powders, or perfumes. You may wear deodorant. Do not shave 48 hours prior to surgery. Men may shave face and neck. Do not bring valuables to the hospital.    [facility] is not responsible for any belongings or valuables.  Contacts, dentures or bridgework may not be worn into surgery. Leave your suitcase in the car. After surgery it may be brought to your room. For patients admitted to the hospital, discharge time is determined  by your treatment team.   Patients discharged the day of surgery will not be allowed to drive home.   Make arrangements for someone to be with you for the first 24 hours of your Same Day Discharge.  __X__ Take these medicines the morning of surgery with A SIP OF WATER:    1. pantoprazole (PROTONIX) 40 MG   2.   3.   4.  5.  6.  ____ Fleet Enema (as directed)   __X__ Use CHG Soap (or wipes) as directed  ____ Use Benzoyl Peroxide Gel as instructed  __X__ Use inhalers on the day of surgery  albuterol (VENTOLIN HFA) 108 (90 Base) MCG/ACT inhaler  ____ Stop metformin 2 days prior to surgery    ____ Take 1/2 of usual insulin dose the night before surgery. No insulin the morning          of surgery.   __X__  Stop clopidogrel (PLAVIX) 75 MG tablet 03/21/22 as instructed by your doctor  before your surgery.   __X__ One Week prior to surgery- Stop Anti-inflammatories such as Ibuprofen, Aleve, Advil, Motrin, meloxicam (MOBIC), diclofenac, etodolac, ketorolac, Toradol, Daypro, piroxicam, Goody's or BC powders. OK TO USE TYLENOL IF NEEDED   __X__ One week prior to surgery stop ALL supplements until after surgery. Omega-3 Fatty Acids (FISH OIL) 1200 MG CAPS   ____ Bring C-Pap to the hospital.    If you have any questions regarding your pre-procedure instructions,  Please call Pre-admit Testing at [phone]

## 2022-03-23 ENCOUNTER — Encounter
Admission: RE | Admit: 2022-03-23 | Discharge: 2022-03-23 | Disposition: A | Discharge status: Home / Self Care | Payer: Medicare Other | Source: Ambulatory Visit | Attending: Surgery | Admitting: Surgery

## 2022-03-23 DIAGNOSIS — I10 Essential (primary) hypertension: Secondary | ICD-10-CM | POA: Diagnosis not present

## 2022-03-23 DIAGNOSIS — E7800 Pure hypercholesterolemia, unspecified: Secondary | ICD-10-CM | POA: Diagnosis not present

## 2022-03-23 DIAGNOSIS — Z01810 Encounter for preprocedural cardiovascular examination: Secondary | ICD-10-CM | POA: Diagnosis not present

## 2022-03-27 NOTE — Anesthesia Preprocedure Evaluation (Signed)
Anesthesia Evaluation  Patient identified by MRN, date of birth, ID band Patient awake    Reviewed: Allergy & Precautions, H&P , NPO status , Patient's Chart, lab work & pertinent test results, reviewed documented beta blocker date and time   History of Anesthesia Complications Negative for: history of anesthetic complications  Airway Mallampati: II  TM Distance: >3 FB Neck ROM: full    Dental  (+) Teeth Intact, Poor Dentition   Pulmonary neg pulmonary ROS, neg sleep apnea, neg COPD, Patient abstained from smoking.Not current smoker,    Pulmonary exam normal breath sounds clear to auscultation       Cardiovascular Exercise Tolerance: Good METShypertension, Pt. on medications (-) CAD and (-) Past MI + dysrhythmias  Rhythm:regular Rate:Normal     Neuro/Psych  Headaches, TIAnegative psych ROS   GI/Hepatic Neg liver ROS, GERD  Medicated and Controlled,  Endo/Other  negative endocrine ROSneg diabetes  Renal/GU negative Renal ROS  negative genitourinary   Musculoskeletal  (+) Arthritis , S/p cervical spine fusion   Abdominal   Peds  Hematology negative hematology ROS (+) Blood dyscrasia, anemia ,   Anesthesia Other Findings Past Medical History: No date: Allergy No date: Anemia     Comment:  takes Ferrous Sulfate daily No date: Arthritis No date: GERD (gastroesophageal reflux disease)     Comment:  takes OTC med No date: Hyperlipidemia No date: Hypertension     Comment:  takes Amlodipine daily No date: Low back pain     Comment:  occasionally  Reproductive/Obstetrics negative OB ROS                             Anesthesia Physical  Anesthesia Plan  ASA: 3  Anesthesia Plan: General   Post-op Pain Management: Regional block* and Minimal or no pain anticipated   Induction: Intravenous  PONV Risk Score and Plan: 3 and Ondansetron, Dexamethasone and Midazolam  Airway Management  Planned: LMA  Additional Equipment: None  Intra-op Plan:   Post-operative Plan:   Informed Consent:   Plan Discussed with: CRNA  Anesthesia Plan Comments: (Discussed risks of anesthesia with patient, including possibility of difficulty with spontaneous ventilation under anesthesia necessitating airway intervention, PONV, and rare risks such as cardiac or respiratory or neurological events. Patient understands.)        Anesthesia Quick Evaluation

## 2022-03-29 ENCOUNTER — Encounter: Payer: Self-pay | Admitting: Surgery

## 2022-03-29 ENCOUNTER — Other Ambulatory Visit: Payer: Self-pay

## 2022-03-29 ENCOUNTER — Ambulatory Visit: Payer: Medicare Other | Admitting: Anesthesiology

## 2022-03-29 ENCOUNTER — Ambulatory Visit
Admission: RE | Admit: 2022-03-29 | Discharge: 2022-03-29 | Disposition: A | Discharge status: Home / Self Care | Payer: Medicare Other | Source: Ambulatory Visit | Attending: Surgery | Admitting: Surgery

## 2022-03-29 ENCOUNTER — Encounter
Admission: RE | Disposition: A | Discharge status: Home / Self Care | Payer: Self-pay | Source: Ambulatory Visit | Attending: Surgery

## 2022-03-29 DIAGNOSIS — K219 Gastro-esophageal reflux disease without esophagitis: Secondary | ICD-10-CM | POA: Diagnosis not present

## 2022-03-29 DIAGNOSIS — Z79899 Other long term (current) drug therapy: Secondary | ICD-10-CM | POA: Insufficient documentation

## 2022-03-29 DIAGNOSIS — D1722 Benign lipomatous neoplasm of skin and subcutaneous tissue of left arm: Secondary | ICD-10-CM | POA: Diagnosis not present

## 2022-03-29 DIAGNOSIS — D649 Anemia, unspecified: Secondary | ICD-10-CM | POA: Insufficient documentation

## 2022-03-29 DIAGNOSIS — I10 Essential (primary) hypertension: Secondary | ICD-10-CM | POA: Diagnosis not present

## 2022-03-29 HISTORY — PX: LIPOMA EXCISION: SHX5283

## 2022-03-29 SURGERY — EXCISION LIPOMA
Anesthesia: General | Site: Arm Upper | Laterality: Left

## 2022-03-29 MED ORDER — FENTANYL CITRATE (PF) 100 MCG/2ML IJ SOLN
INTRAMUSCULAR | Status: DC | PRN
Start: 2022-03-29 — End: 2022-03-29
  Administered 2022-03-29 (×2): 50 ug via INTRAVENOUS

## 2022-03-29 MED ORDER — BUPIVACAINE LIPOSOME 1.3 % IJ SUSP: 20.0000 mL | Freq: Once | INTRAMUSCULAR | Status: DC

## 2022-03-29 MED ORDER — PHENYLEPHRINE 80 MCG/ML (10ML) SYRINGE FOR IV PUSH (FOR BLOOD PRESSURE SUPPORT)
PREFILLED_SYRINGE | INTRAVENOUS | Status: AC
  Filled 2022-03-29: qty 10

## 2022-03-29 MED ORDER — OXYCODONE HCL 5 MG/5ML PO SOLN: 5.0000 mg | Freq: Once | ORAL | Status: DC | PRN

## 2022-03-29 MED ORDER — BUPIVACAINE HCL (PF) 0.5 % IJ SOLN
INTRAMUSCULAR | Status: AC
  Filled 2022-03-29: qty 30

## 2022-03-29 MED ORDER — ONDANSETRON HCL 4 MG/2ML IJ SOLN
INTRAMUSCULAR | Status: AC
  Filled 2022-03-29: qty 2

## 2022-03-29 MED ORDER — CHLORHEXIDINE GLUCONATE CLOTH 2 % EX PADS: 6.0000 | MEDICATED_PAD | Freq: Once | CUTANEOUS | Status: DC

## 2022-03-29 MED ORDER — CHLORHEXIDINE GLUCONATE CLOTH 2 % EX PADS
6.0000 | MEDICATED_PAD | Freq: Once | CUTANEOUS | Status: AC
  Administered 2022-03-29: 6 via TOPICAL

## 2022-03-29 MED ORDER — PROPOFOL 10 MG/ML IV BOLUS
INTRAVENOUS | Status: DC | PRN
  Administered 2022-03-29: 150 mg via INTRAVENOUS

## 2022-03-29 MED ORDER — PHENYLEPHRINE HCL (PRESSORS) 10 MG/ML IV SOLN
INTRAVENOUS | Status: DC | PRN
  Administered 2022-03-29: 80 ug via INTRAVENOUS

## 2022-03-29 MED ORDER — DEXAMETHASONE SODIUM PHOSPHATE 10 MG/ML IJ SOLN
INTRAMUSCULAR | Status: DC | PRN
  Administered 2022-03-29: 10 mg via INTRAVENOUS

## 2022-03-29 MED ORDER — SODIUM CHLORIDE (PF) 0.9 % IJ SOLN: INTRAMUSCULAR | Status: DC | PRN

## 2022-03-29 MED ORDER — GABAPENTIN 300 MG PO CAPS: 300.0000 mg | ORAL_CAPSULE | ORAL | Status: AC

## 2022-03-29 MED ORDER — ORAL CARE MOUTH RINSE: 15.0000 mL | Freq: Once | OROMUCOSAL | Status: AC

## 2022-03-29 MED ORDER — ACETAMINOPHEN 500 MG PO TABS
ORAL_TABLET | ORAL | Status: AC
  Administered 2022-03-29: 1000 mg via ORAL
  Filled 2022-03-29: qty 2

## 2022-03-29 MED ORDER — DEXAMETHASONE SODIUM PHOSPHATE 10 MG/ML IJ SOLN
INTRAMUSCULAR | Status: AC
  Filled 2022-03-29: qty 1

## 2022-03-29 MED ORDER — BUPIVACAINE-EPINEPHRINE 0.5% -1:200000 IJ SOLN
INTRAMUSCULAR | Status: DC | PRN
  Administered 2022-03-29: 30 mL via INTRAMUSCULAR

## 2022-03-29 MED ORDER — CHLORHEXIDINE GLUCONATE 0.12 % MT SOLN: 15.0000 mL | Freq: Once | OROMUCOSAL | Status: AC

## 2022-03-29 MED ORDER — GABAPENTIN 300 MG PO CAPS
ORAL_CAPSULE | ORAL | Status: AC
  Administered 2022-03-29: 300 mg via ORAL
  Filled 2022-03-29: qty 1

## 2022-03-29 MED ORDER — EPHEDRINE 5 MG/ML INJ
INTRAVENOUS | Status: AC
  Filled 2022-03-29: qty 5

## 2022-03-29 MED ORDER — OXYCODONE HCL 5 MG PO TABS: 5.0000 mg | ORAL_TABLET | Freq: Once | ORAL | Status: DC | PRN

## 2022-03-29 MED ORDER — PROMETHAZINE HCL 25 MG/ML IJ SOLN: 6.2500 mg | INTRAMUSCULAR | Status: DC | PRN

## 2022-03-29 MED ORDER — FENTANYL CITRATE (PF) 100 MCG/2ML IJ SOLN
INTRAMUSCULAR | Status: AC
  Filled 2022-03-29: qty 2

## 2022-03-29 MED ORDER — MIDAZOLAM HCL 2 MG/2ML IJ SOLN
INTRAMUSCULAR | Status: DC | PRN
  Administered 2022-03-29: 2 mg via INTRAVENOUS

## 2022-03-29 MED ORDER — LACTATED RINGERS IV SOLN: INTRAVENOUS | Status: DC

## 2022-03-29 MED ORDER — ACETAMINOPHEN 10 MG/ML IV SOLN: 1000.0000 mg | Freq: Once | INTRAVENOUS | Status: DC | PRN

## 2022-03-29 MED ORDER — BUPIVACAINE LIPOSOME 1.3 % IJ SUSP
INTRAMUSCULAR | Status: AC
  Filled 2022-03-29: qty 10

## 2022-03-29 MED ORDER — 0.9 % SODIUM CHLORIDE (POUR BTL) OPTIME
TOPICAL | Status: DC | PRN
  Administered 2022-03-29: 500 mL

## 2022-03-29 MED ORDER — CEFAZOLIN SODIUM-DEXTROSE 2-4 GM/100ML-% IV SOLN
2.0000 g | INTRAVENOUS | Status: AC
  Administered 2022-03-29: 2 g via INTRAVENOUS

## 2022-03-29 MED ORDER — ACETAMINOPHEN 500 MG PO TABS: 1000.0000 mg | ORAL_TABLET | Freq: Four times a day (QID) | ORAL | Status: DC | PRN

## 2022-03-29 MED ORDER — MIDAZOLAM HCL 2 MG/2ML IJ SOLN
INTRAMUSCULAR | Status: AC
  Filled 2022-03-29: qty 2

## 2022-03-29 MED ORDER — PROPOFOL 10 MG/ML IV BOLUS
INTRAVENOUS | Status: AC
  Filled 2022-03-29: qty 20

## 2022-03-29 MED ORDER — ACETAMINOPHEN 500 MG PO TABS: 1000.0000 mg | ORAL_TABLET | ORAL | Status: AC

## 2022-03-29 MED ORDER — EPHEDRINE SULFATE (PRESSORS) 50 MG/ML IJ SOLN
INTRAMUSCULAR | Status: DC | PRN
  Administered 2022-03-29: 10 mg via INTRAVENOUS

## 2022-03-29 MED ORDER — BUPIVACAINE-EPINEPHRINE (PF) 0.5% -1:200000 IJ SOLN
INTRAMUSCULAR | Status: AC
  Filled 2022-03-29: qty 30

## 2022-03-29 MED ORDER — OXYCODONE HCL 5 MG PO TABS: 5.0000 mg | ORAL_TABLET | ORAL | 0 refills | Status: DC | PRN

## 2022-03-29 MED ORDER — BUPIVACAINE LIPOSOME 1.3 % IJ SUSP
INTRAMUSCULAR | Status: AC
  Filled 2022-03-29: qty 20

## 2022-03-29 MED ORDER — LIDOCAINE HCL (CARDIAC) PF 100 MG/5ML IV SOSY
PREFILLED_SYRINGE | INTRAVENOUS | Status: DC | PRN
  Administered 2022-03-29: 100 mg via INTRAVENOUS

## 2022-03-29 MED ORDER — DROPERIDOL 2.5 MG/ML IJ SOLN
0.6250 mg | Freq: Once | INTRAMUSCULAR | Status: DC | PRN
Start: 2022-03-29 — End: 2022-03-29

## 2022-03-29 MED ORDER — LIDOCAINE HCL (PF) 2 % IJ SOLN
INTRAMUSCULAR | Status: AC
  Filled 2022-03-29: qty 5

## 2022-03-29 MED ORDER — CEFAZOLIN SODIUM-DEXTROSE 2-4 GM/100ML-% IV SOLN
INTRAVENOUS | Status: AC
  Filled 2022-03-29: qty 100

## 2022-03-29 MED ORDER — CHLORHEXIDINE GLUCONATE 0.12 % MT SOLN
OROMUCOSAL | Status: AC
  Administered 2022-03-29: 15 mL via OROMUCOSAL
  Filled 2022-03-29: qty 15

## 2022-03-29 MED ORDER — FENTANYL CITRATE (PF) 100 MCG/2ML IJ SOLN: 25.0000 ug | INTRAMUSCULAR | Status: DC | PRN

## 2022-03-29 MED ORDER — ONDANSETRON HCL 4 MG/2ML IJ SOLN
INTRAMUSCULAR | Status: DC | PRN
  Administered 2022-03-29: 4 mg via INTRAVENOUS

## 2022-03-29 SURGICAL SUPPLY — 38 items
ADH SKN CLS APL DERMABOND .7 (GAUZE/BANDAGES/DRESSINGS) ×1
APL PRP STRL LF DISP 70% ISPRP (MISCELLANEOUS) ×1
CHLORAPREP W/TINT 26 (MISCELLANEOUS) ×2 IMPLANT
DERMABOND ADVANCED (GAUZE/BANDAGES/DRESSINGS) ×1
DERMABOND ADVANCED .7 DNX12 (GAUZE/BANDAGES/DRESSINGS) ×2 IMPLANT
DRAPE 3/4 80X56 (DRAPES) ×4 IMPLANT
DRAPE LAPAROTOMY 100X77 ABD (DRAPES) ×2 IMPLANT
ELECT CAUTERY BLADE TIP 2.5 (TIP) ×1
ELECT REM PT RETURN 9FT ADLT (ELECTROSURGICAL) ×1
ELECTRODE CAUTERY BLDE TIP 2.5 (TIP) ×2 IMPLANT
ELECTRODE REM PT RTRN 9FT ADLT (ELECTROSURGICAL) ×2 IMPLANT
GAUZE 4X4 16PLY ~~LOC~~+RFID DBL (SPONGE) ×2 IMPLANT
GLOVE SURG SYN 7.0 (GLOVE) ×1 IMPLANT
GLOVE SURG SYN 7.0 PF PI (GLOVE) ×2 IMPLANT
GLOVE SURG SYN 7.5  E (GLOVE) ×1
GLOVE SURG SYN 7.5 E (GLOVE) ×1 IMPLANT
GLOVE SURG SYN 7.5 PF PI (GLOVE) ×2 IMPLANT
GOWN STRL REUS W/ TWL LRG LVL3 (GOWN DISPOSABLE) ×4 IMPLANT
GOWN STRL REUS W/TWL LRG LVL3 (GOWN DISPOSABLE) ×2
KIT TURNOVER KIT A (KITS) ×2 IMPLANT
LABEL OR SOLS (LABEL) ×2 IMPLANT
MANIFOLD NEPTUNE II (INSTRUMENTS) ×2 IMPLANT
NEEDLE HYPO 22GX1.5 SAFETY (NEEDLE) ×2 IMPLANT
NS IRRIG 1000ML POUR BTL (IV SOLUTION) ×2 IMPLANT
PACK BASIN MINOR ARMC (MISCELLANEOUS) ×2 IMPLANT
SUT MNCRL 4-0 (SUTURE) ×1
SUT MNCRL 4-0 27XMFL (SUTURE) ×1
SUT SILK 3 0 (SUTURE) ×1
SUT SILK 3-0 18XBRD TIE 12 (SUTURE) IMPLANT
SUT VIC AB 0 SH 27 (SUTURE) ×4 IMPLANT
SUT VIC AB 2-0 SH 27 (SUTURE) ×1
SUT VIC AB 2-0 SH 27XBRD (SUTURE) IMPLANT
SUT VIC AB 3-0 SH 27 (SUTURE) ×2
SUT VIC AB 3-0 SH 27X BRD (SUTURE) ×4 IMPLANT
SUTURE MNCRL 4-0 27XMF (SUTURE) ×2 IMPLANT
SYR 30ML LL (SYRINGE) ×2 IMPLANT
TRAP FLUID SMOKE EVACUATOR (MISCELLANEOUS) ×2 IMPLANT
WATER STERILE IRR 500ML POUR (IV SOLUTION) ×2 IMPLANT

## 2022-03-29 NOTE — Transfer of Care (Signed)
Immediate Anesthesia Transfer of Care Note  Patient: [name]  Procedure(s) Performed: EXCISION LIPOMA, left upper extremity (Left: Arm Upper)  Patient Location: PACU  Anesthesia Type:General  Level of Consciousness: drowsy  Airway & Oxygen Therapy: Patient Spontanous Breathing and Patient connected to face mask oxygen  Post-op Assessment: Report given to RN and Post -op Vital signs reviewed and stable  Post vital signs: Reviewed and stable  Last Vitals:  Vitals Value Taken Time  BP 147/66 03/29/22 0851  Temp 36.1 C 03/29/22 0851  Pulse 72 03/29/22 0854  Resp 17 03/29/22 0854  SpO2 98 % 03/29/22 0854  Vitals shown include unvalidated device data.  Last Pain:  Vitals:   03/29/22 0624  TempSrc: Temporal  PainSc: 0-No pain         Complications: No notable events documented.

## 2022-03-29 NOTE — Interval H&P Note (Signed)
History and Physical Interval Note:  03/29/2022 7:09 AM  [name]  has presented today for surgery, with the diagnosis of lipoma of left upper extremity greater 3 cm D17.22.  The various methods of treatment have been discussed with the patient and family. After consideration of risks, benefits and other options for treatment, the patient has consented to  Procedure(s): EXCISION LIPOMA, left upper extremity (Left) as a surgical intervention.  The patient's history has been reviewed, patient examined, no change in status, stable for surgery.  I have reviewed the patient's chart and labs.  Questions were answered to the patient's satisfaction.     Clairessa Boulet

## 2022-03-29 NOTE — Discharge Instructions (Signed)

## 2022-03-29 NOTE — Anesthesia Procedure Notes (Signed)
Procedure Name: LMA Insertion Date/Time: 03/29/2022 7:53 AM  Performed by: Cammie Sickle, CRNAPre-anesthesia Checklist: Patient identified, Patient being monitored, Timeout performed, Emergency Drugs available and Suction available Patient Re-evaluated:Patient Re-evaluated prior to induction Oxygen Delivery Method: Circle system utilized Preoxygenation: Pre-oxygenation with 100% oxygen Induction Type: IV induction Ventilation: Mask ventilation without difficulty LMA: LMA inserted LMA Size: 4.0 Tube type: Oral Number of attempts: 1 Placement Confirmation: positive ETCO2 and breath sounds checked- equal and bilateral Tube secured with: Tape Dental Injury: Teeth and Oropharynx as per pre-operative assessment

## 2022-03-29 NOTE — Op Note (Addendum)
  Procedure Date:  03/29/2022  Pre-operative Diagnosis:  Left upper extremity lipoma  Post-operative Diagnosis: Left upper extremity lipoma, 4 x 3 cm  Procedure:  Excision of left upper extremity lipoma with mobilization of the left cephalic vein  Surgeon:  Melvyn Neth, MD  Anesthesia:  General endotracheal  Estimated Blood Loss:  5 ml  Specimens:  Left upper extremity lipoma  Complications:  None  Indications for Procedure:  This is a 66 y.o. female with diagnosis of a symptomatic left upper extremity lipoma, located in the [facility] fossa.  The patient wishes to have this excised. The risks of bleeding, abscess or infection, injury to surrounding structures, and need for further procedures were all discussed with the patient and she was willing to proceed.  Description of Procedure: The patient was correctly identified in the preoperative area and brought into the operating room.  The patient was placed supine with VTE prophylaxis in place.  Appropriate time-outs were performed.  Anesthesia was induced and the patient was intubated.  Appropriate antibiotics were infused.  The patient's left upper extremity was prepped and draped in usual sterile fashion.  A 5 cm incision was made over the lipoma, and cautery was used to dissect down the subcutaneous tissue to the lipoma itself.  Skin flaps were created using cautery to expose the lipoma better, and it was noted that the cephalic vein was traversing to the lateral aspect of the lipoma.  There was one small vein branch going into the lipoma.  This was dissected, ligated and cut.  Medially, the basilic vein was more medial to the mass and not in the way.  Once the cephalic vein was mobilized and freed, the lipoma was excised using cautery, intact.  It was sent off to pathology.  The cavity was then irrigated and hemostasis was assured with cautery.  Local anesthetic was infused intradermally.  The wound was then closed in three layers using 2-0  Vicryl, 3-0 Vicryl and 4-0 Monocryl.  The incision was cleaned and sealed with DermaBond.  The patient was then emerged from anesthesia, extubated, and brought to the recovery room for further management.    The patient tolerated the procedure well and all counts were correct at the end of the case.   Melvyn Neth, MD

## 2022-03-29 NOTE — Progress Notes (Deleted)
Complete physical exam   Patient: [name]   DOB: 19-Jan-1956   66 y.o. Female  MRN: [id number] Visit Date: 03/30/2022  Today's healthcare provider: Gwyneth Sprout, FNP   No chief complaint on file.  Subjective    [name] is a 66 y.o. female who presents today for a complete physical exam.  She reports consuming a {diet types:17450} diet. {Exercise:19826} She generally feels {well/fairly well/poorly:18703}. She reports sleeping {well/fairly well/poorly:18703}. She {does/does not:200015} have additional problems to discuss today.  HPI    Past Medical History:  Diagnosis Date   Allergy    Anemia    takes Ferrous Sulfate daily   Arthritis    GERD (gastroesophageal reflux disease)    takes OTC med   Heart murmur    Hyperlipidemia    Hypertension    takes Amlodipine daily   Low back pain    occasionally   Past Surgical History:  Procedure Laterality Date   ANTERIOR CERVICAL DECOMP/DISCECTOMY FUSION  10/19/2011   Procedure: ANTERIOR CERVICAL DECOMPRESSION/DISCECTOMY FUSION 1 LEVEL/HARDWARE REMOVAL;  Surgeon: Elaina Hoops, MD;  Location: [place] [facility];  Service: Neurosurgery;  Laterality: N/A;  Cervical four - five  Anterior cervical decompression fusion with redo at six - seven  Rm 33   CARPAL TUNNEL RELEASE     COLONOSCOPY  08/2014   polyps   COLONOSCOPY WITH PROPOFOL N/A 02/04/2020   Procedure: COLONOSCOPY WITH PROPOFOL;  Surgeon: [name], MD;  Location: [facility] ENDOSCOPY;  Service: Endoscopy;  Laterality: N/A;   ESOPHAGOGASTRODUODENOSCOPY (EGD) WITH PROPOFOL N/A 06/23/2015   Procedure: ESOPHAGOGASTRODUODENOSCOPY (EGD) WITH PROPOFOL;  Surgeon: [name], MD;  Location: [facility] ENDOSCOPY;  Service: Endoscopy;  Laterality: N/A;   NECK SURGERY     POSTERIOR CERVICAL FUSION/FORAMINOTOMY N/A 08/26/2013   Procedure: C/4-5,C/5-6,C/6-7 Posterior Cervical Fusion w/lateral mass fixation;  Surgeon: Elaina Hoops, MD;  Location: [place] [facility]URO ORS;  Service: Neurosurgery;   Laterality: N/A;   TUBAL LIGATION     Social History   Socioeconomic History   Marital status: Single    Spouse name: Not on file   Number of children: 3   Years of education: Not on file   Highest education level: Some college, no degree  Occupational History   Occupation: disable  Tobacco Use   Smoking status: Never   Smokeless tobacco: Never  Vaping Use   Vaping Use: Never used  Substance and Sexual Activity   Alcohol use: No   Drug use: No   Sexual activity: Not on file  Other Topics Concern   Not on file  Social History Narrative   Not on file   Social Determinants of Health   Financial Resource Strain: Low Risk  (03/19/2020)   Overall Financial Resource Strain (CARDIA)    Difficulty of Paying Living Expenses: Not hard at all  Food Insecurity: No Food Insecurity (03/19/2020)   Hunger Vital Sign    Worried About Running Out of Food in the Last Year: Never true    [place] in the Last Year: Never true  Transportation Needs: No Transportation Needs (03/19/2020)   PRAPARE - Hydrologist (Medical): No    Lack of Transportation (Non-Medical): No  Physical Activity: Inactive (03/19/2020)   Exercise Vital Sign    Days of Exercise per Week: 0 days    Minutes of Exercise per Session: 0 min  Stress: Stress Concern Present (03/19/2020)   [place] -  Occupational Stress Questionnaire    Feeling of Stress : To some extent  Social Connections: Moderately Isolated (03/19/2020)   Social Connection and Isolation Panel [NHANES]    Frequency of Communication with Friends and Family: More than three times a week    Frequency of Social Gatherings with Friends and Family: More than three times a week    Attends Religious Services: More than 4 times per year    Active Member of [employer] or Organizations: No    Attends Archivist Meetings: Never    Marital Status: Never married  Intimate Partner Violence: Not At  Risk (03/19/2020)   Humiliation, Afraid, Rape, and Kick questionnaire    Fear of Current or Ex-Partner: No    Emotionally Abused: No    Physically Abused: No    Sexually Abused: No   Family Status  Relation Name Status   Mother  Alive   Daughter  Deceased       unknown causes   Father  Other   Sister  Web designer  Alive   Son  Alive   Sister  Alive   Sister  Alive   Son  Alive   Sister  Deceased   Neg Hx  (Not Specified)   Family History  Problem Relation Age of Onset   Hypertension Mother    Heart disease Mother    Kidney failure Sister    Breast cancer Neg Hx    Allergies  Allergen Reactions   Aspirin Nausea Only   Hydrochlorothiazide     Rash   Ibuprofen Other (See Comments)    Pt says her doctor doesn't want her taking this with the blood thinner she's taking   Lisinopril     Other reaction(s): Unknown   Sulfa Antibiotics Hives and Nausea Only    Patient Care Team: Gwyneth Sprout, FNP as PCP - General (Family Medicine) [name], MD as Consulting Physician (Gastroenterology) Kary Kos, MD as Consulting Physician (Neurosurgery)   Medications: Outpatient Medications Prior to Visit  Medication Sig   acetaminophen (TYLENOL) 500 MG tablet Take 2 tablets (1,000 mg total) by mouth every 6 (six) hours as needed for mild pain.   albuterol (VENTOLIN HFA) 108 (90 Base) MCG/ACT inhaler Inhale 2 puffs into the lungs every 6 (six) hours as needed for wheezing or shortness of breath.   amLODipine (NORVASC) 10 MG tablet TAKE 1 TABLET(10 MG) BY MOUTH DAILY   atorvastatin (LIPITOR) 40 MG tablet Take 1 tablet (40 mg total) by mouth daily.   Cholecalciferol (VITAMIN D) 2000 UNITS tablet Take 2,000 Units by mouth daily.   clopidogrel (PLAVIX) 75 MG tablet TAKE 1 TABLET(75 MG) BY MOUTH DAILY   cyclobenzaprine (FLEXERIL) 10 MG tablet TAKE 1 TABLET(10 MG) BY MOUTH AT BEDTIME (Patient not taking: Reported on 03/21/2022)   fluconazole (DIFLUCAN) 150 MG tablet Take 1 tablet for  yeast infection, PO, repeat in 4 days if symptoms remain. (Patient not taking: Reported on 03/21/2022)   fluticasone (FLONASE) 50 MCG/ACT nasal spray Place 2 sprays into both nostrils daily.   loratadine (CLARITIN) 10 MG tablet Take 1 tablet (10 mg total) by mouth daily.   Omega-3 Fatty Acids (FISH OIL) 1200 MG CAPS Take 1 capsule by mouth daily.   oxyCODONE (OXY IR/ROXICODONE) 5 MG immediate release tablet Take 1 tablet (5 mg total) by mouth every 4 (four) hours as needed for severe pain.   pantoprazole (PROTONIX) 40 MG tablet Take 40 mg by mouth daily.   potassium  chloride (KLOR-CON) 10 MEQ tablet Take 10 mEq by mouth daily. M-F only   predniSONE (STERAPRED UNI-PAK 21 TAB) 10 MG (21) TBPK tablet Day 1 & 2 take 6 tablets Day 3 &4 take 5 tablets Day 5 &6 take 4 tablets Day 7 & 8 take 3 tablets Day 9 & 10 take 2 tablets Day 11 & 12 take 1 tablet Day 13 & 14 take 1/2 tablet (Patient not taking: Reported on 03/21/2022)   Facility-Administered Medications Prior to Visit  Medication Dose Route Frequency Provider   acetaminophen (OFIRMEV) IV 1,000 mg  1,000 mg Intravenous Once PRN [place] Ouch, MD   bupivacaine liposome (EXPAREL) 1.3 % injection 266 mg  20 mL Infiltration Once Piscoya, Jose, MD   Chlorhexidine Gluconate Cloth 2 % PADS 6 each  6 each Topical Once Piscoya, Jose, MD   droperidol (INAPSINE) 2.5 MG/ML injection 0.625 mg  0.625 mg Intravenous Once PRN Iran Ouch, MD   fentaNYL (SUBLIMAZE) injection 25-50 mcg  25-50 mcg Intravenous Q5 min PRN Iran Ouch, MD   lactated ringers infusion   Intravenous Continuous Iran Ouch, MD   oxyCODONE (Oxy IR/ROXICODONE) immediate release tablet 5 mg  5 mg Oral Once PRN Iran Ouch, MD   Or   oxyCODONE (ROXICODONE) 5 MG/5ML solution 5 mg  5 mg Oral Once PRN Iran Ouch, MD   promethazine (PHENERGAN) injection 6.25 mg  6.25 mg Intravenous Q15 min PRN Iran Ouch, MD    Review of  Systems  All other systems reviewed and are negative.   Last CBC Lab Results  Component Value Date   WBC 8.5 02/16/2022   HGB 14.5 02/16/2022   HCT 43.8 02/16/2022   MCV 85 02/16/2022   MCH 28.2 02/16/2022   RDW 13.5 02/16/2022   PLT 337 90/24/0973   Last metabolic panel Lab Results  Component Value Date   GLUCOSE 116 (H) 02/16/2022   NA 145 (H) 02/16/2022   K 3.5 02/16/2022   CL 103 02/16/2022   CO2 24 02/16/2022   BUN 10 02/16/2022   CREATININE 0.81 02/16/2022   EGFR 81 02/16/2022   CALCIUM 9.8 02/16/2022   PROT 7.4 02/16/2022   ALBUMIN 4.3 02/16/2022   LABGLOB 3.1 02/16/2022   AGRATIO 1.4 02/16/2022   BILITOT 0.3 02/16/2022   ALKPHOS 113 02/16/2022   AST 13 02/16/2022   ALT 5 02/16/2022   ANIONGAP 10 02/24/2020   Last lipids Lab Results  Component Value Date   CHOL 158 03/26/2021   HDL 50 03/26/2021   LDLCALC 86 03/26/2021   TRIG 123 03/26/2021   CHOLHDL 3.8 03/19/2020   Last hemoglobin A1c Lab Results  Component Value Date   HGBA1C 6.0 (A) 09/23/2021   Last thyroid functions Lab Results  Component Value Date   TSH 0.577 03/26/2021      Objective    There were no vitals taken for this visit. BP Readings from Last 3 Encounters:  03/29/22 (!) 132/59  03/16/22 (!) 154/77  03/02/22 138/73   Wt Readings from Last 3 Encounters:  03/29/22 147 lb (66.7 kg)  03/21/22 171 lb (77.6 kg)  03/16/22 171 lb 6.4 oz (77.7 kg)       Physical Exam  ***  Last depression screening scores    02/16/2022   11:33 AM 08/09/2021    1:55 PM 03/25/2021   10:57 AM  PHQ 2/9 Scores  PHQ - 2 Score _0 PHQ- 9 Score 7 2 1  Last fall risk screening    02/16/2022   11:32 AM  [place] in the past year? 0  Number falls in past yr: 0  Injury with Fall? 0  Risk for fall due to : No Fall Risks   Last Audit-C alcohol use screening    02/16/2022   11:32 AM  Alcohol Use Disorder Test (AUDIT)  1. How often do you have a drink containing alcohol? 0  2.  How many drinks containing alcohol do you have on a typical day when you are drinking? 0  3. How often do you have six or more drinks on one occasion? 0  AUDIT-C Score 0   A score of 3 or more in women, and 4 or more in men indicates increased risk for alcohol abuse, EXCEPT if all of the points are from question 1   No results found for any visits on 03/30/22.  Assessment & Plan    Routine Health Maintenance and Physical Exam  Exercise Activities and Dietary recommendations  Goals      Exercise 3x per week (30 min per time)     Recommend to walk 3 days a week for at least 30 minutes at a time.      Increase water intake     Recommend increasing water intake to 4 glasses a day.         Immunization History  Administered Date(s) Administered   Influenza-Unspecified 03/07/2017, 06/14/2018   MMR 09/09/1987   [place] Pediatric Vaccination 57mo to <614yr01/10/2020, 08/25/2020   Tdap 09/30/2008, 01/02/2019   Zoster Recombinat (Shingrix) 06/27/2019    Health Maintenance  Topic Date Due   Zoster Vaccines- Shingrix (2 of 2) 08/22/2019   COVID-19 Vaccine (1) 09/22/2020   Pneumonia Vaccine 6546Years old (1 - PCV) Never done   DEXA SCAN  Never done   PAP SMEAR-Modifier  12/09/2021   INFLUENZA VACCINE  02/22/2022   MAMMOGRAM  04/10/2023   COLONOSCOPY (Pts 45-4931yrnsurance coverage will need to be confirmed)  02/04/2027   TETANUS/TDAP  01/01/2029   Hepatitis C Screening  Completed   HIV Screening  Completed   HPV VACCINES  Aged Out    Discussed health benefits of physical activity, and encouraged her to engage in regular exercise appropriate for her age and condition.  ***  No follow-ups on file.     {provider attestation***:1}   EliGwyneth [phone]) [phone])  [place]

## 2022-03-30 ENCOUNTER — Ambulatory Visit: Payer: Medicare Other | Admitting: Family Medicine

## 2022-03-30 DIAGNOSIS — Z23 Encounter for immunization: Secondary | ICD-10-CM

## 2022-03-30 DIAGNOSIS — Z0000 Encounter for general adult medical examination without abnormal findings: Secondary | ICD-10-CM

## 2022-03-30 DIAGNOSIS — E7800 Pure hypercholesterolemia, unspecified: Secondary | ICD-10-CM

## 2022-03-30 DIAGNOSIS — Z780 Asymptomatic menopausal state: Secondary | ICD-10-CM

## 2022-03-30 DIAGNOSIS — R7303 Prediabetes: Secondary | ICD-10-CM

## 2022-03-30 DIAGNOSIS — Z124 Encounter for screening for malignant neoplasm of cervix: Secondary | ICD-10-CM

## 2022-03-30 DIAGNOSIS — R7989 Other specified abnormal findings of blood chemistry: Secondary | ICD-10-CM

## 2022-03-30 LAB — SURGICAL PATHOLOGY

## 2022-03-30 NOTE — Anesthesia Postprocedure Evaluation (Signed)
Anesthesia Post Note  Patient: [name]  Procedure(s) Performed: EXCISION LIPOMA, left upper extremity (Left: Arm Upper)  Patient location during evaluation: PACU Anesthesia Type: General Level of consciousness: awake and alert Pain management: pain level controlled Vital Signs Assessment: post-procedure vital signs reviewed and stable Respiratory status: spontaneous breathing, nonlabored ventilation and respiratory function stable Cardiovascular status: blood pressure returned to baseline and stable Postop Assessment: no apparent nausea or vomiting Anesthetic complications: no   No notable events documented.   Last Vitals:  Vitals:   03/29/22 0955 03/29/22 1137  BP: (!) 145/62 (!) 132/59  Pulse: 71 74  Resp: 16 16  Temp: (!) 36.1 C   SpO2: 95% 97%    Last Pain:  Vitals:   03/30/22 0805  TempSrc:   PainSc: 0-No pain                 Iran Ouch

## 2022-04-12 ENCOUNTER — Encounter: Payer: Medicare Other | Admitting: Physician Assistant

## 2022-04-13 ENCOUNTER — Other Ambulatory Visit: Payer: Self-pay | Admitting: Family Medicine

## 2022-04-13 DIAGNOSIS — Z1231 Encounter for screening mammogram for malignant neoplasm of breast: Secondary | ICD-10-CM

## 2022-04-15 NOTE — Progress Notes (Deleted)
Complete physical exam   Patient: [name]   DOB: September 08, 1955   66 y.o. Female  MRN: [id number] Visit Date: 04/20/2022  Today's healthcare provider: Gwyneth Sprout, FNP   No chief complaint on file.  Subjective    [name] is a 66 y.o. female who presents today for a complete physical exam.  She reports consuming a {diet types:17450} diet. {Exercise:19826} She generally feels {well/fairly well/poorly:18703}. She reports sleeping {well/fairly well/poorly:18703}. She {does/does not:200015} have additional problems to discuss today.  HPI  ***  Past Medical History:  Diagnosis Date   Allergy    Anemia    takes Ferrous Sulfate daily   Arthritis    GERD (gastroesophageal reflux disease)    takes OTC med   Heart murmur    Hyperlipidemia    Hypertension    takes Amlodipine daily   Low back pain    occasionally   Past Surgical History:  Procedure Laterality Date   ANTERIOR CERVICAL DECOMP/DISCECTOMY FUSION  10/19/2011   Procedure: ANTERIOR CERVICAL DECOMPRESSION/DISCECTOMY FUSION 1 LEVEL/HARDWARE REMOVAL;  Surgeon: Elaina Hoops, MD;  Location: [place] NEURO ORS;  Service: Neurosurgery;  Laterality: N/A;  Cervical four - five  Anterior cervical decompression fusion with redo at six - seven  Rm 33   CARPAL TUNNEL RELEASE     COLONOSCOPY  08/2014   polyps   COLONOSCOPY WITH PROPOFOL N/A 02/04/2020   Procedure: COLONOSCOPY WITH PROPOFOL;  Surgeon: [name], MD;  Location[facility] ENDOSCOPY;  Service: Endoscopy;  Laterality: N/A;   ESOPHAGOGASTRODUODENOSCOPY (EGD) WITH PROPOFOL N/A 06/23/2015   Procedure: ESOPHAGOGASTRODUODENOSCOPY (EGD) WITH PROPOFOL;  Surgeon: [name], MD;  Location: [facility] ENDOSCOPY;  Service: Endoscopy;  Laterality: N/A;   LIPOMA EXCISION Left 03/29/2022   Procedure: EXCISION LIPOMA, left upper extremity;  Surgeon: Olean Ree, MD;  Location: [facility] ORS;  Service: General;  Laterality: Left;   NECK SURGERY     POSTERIOR CERVICAL FUSION/FORAMINOTOMY  N/A 08/26/2013   Procedure: C/4-5,C/5-6,C/6-7 Posterior Cervical Fusion w/lateral mass fixation;  Surgeon: Elaina Hoops, MD;  Location: [place] NEURO ORS;  Service: Neurosurgery;  Laterality: N/A;   TUBAL LIGATION     Social History   Socioeconomic History   Marital status: Single    Spouse name: Not on file   Number of children: 3   Years of education: Not on file   Highest education level: Some college, no degree  Occupational History   Occupation: disable  Tobacco Use   Smoking status: Never   Smokeless tobacco: Never  Vaping Use   Vaping Use: Never used  Substance and Sexual Activity   Alcohol use: No   Drug use: No   Sexual activity: Not on file  Other Topics Concern   Not on file  Social History Narrative   Not on file   Social Determinants of Health   Financial Resource Strain: Low Risk  (03/19/2020)   Overall Financial Resource Strain (CARDIA)    Difficulty of Paying Living Expenses: Not hard at all  Food Insecurity: No Food Insecurity (03/19/2020)   Hunger Vital Sign    Worried About Running Out of Food in the Last Year: Never true    [place] in the Last Year: Never true  Transportation Needs: No Transportation Needs (03/19/2020)   PRAPARE - Hydrologist (Medical): No    Lack of Transportation (Non-Medical): No  Physical Activity: Inactive (03/19/2020)   Exercise Vital Sign    Days  of Exercise per Week: 0 days    Minutes of Exercise per Session: 0 min  Stress: Stress Concern Present (03/19/2020)   [place]    Feeling of Stress : To some extent  Social Connections: Moderately Isolated (03/19/2020)   Social Connection and Isolation Panel [NHANES]    Frequency of Communication with Friends and Family: More than three times a week    Frequency of Social Gatherings with Friends and Family: More than three times a week    Attends Religious Services: More than 4 times per  year    Active Member of [employer] or Organizations: No    Attends Archivist Meetings: Never    Marital Status: Never married  Intimate Partner Violence: Not At Risk (03/19/2020)   Humiliation, Afraid, Rape, and Kick questionnaire    Fear of Current or Ex-Partner: No    Emotionally Abused: No    Physically Abused: No    Sexually Abused: No   Family Status  Relation Name Status   Mother  Alive   Daughter  Deceased       unknown causes   Father  Other   Sister  Web designer  Alive   Son  Alive   Sister  Alive   Sister  Alive   Son  Alive   Sister  Deceased   Neg Hx  (Not Specified)   Family History  Problem Relation Age of Onset   Hypertension Mother    Heart disease Mother    Kidney failure Sister    Breast cancer Neg Hx    Allergies  Allergen Reactions   Aspirin Nausea Only   Hydrochlorothiazide     Rash   Ibuprofen Other (See Comments)    Pt says her doctor doesn't want her taking this with the blood thinner she's taking   Lisinopril     Other reaction(s): Unknown   Sulfa Antibiotics Hives and Nausea Only    Patient Care Team: Gwyneth Sprout, FNP as PCP - General (Family Medicine) [name], MD as Consulting Physician (Gastroenterology) Kary Kos, MD as Consulting Physician (Neurosurgery)   Medications: Outpatient Medications Prior to Visit  Medication Sig   acetaminophen (TYLENOL) 500 MG tablet Take 2 tablets (1,000 mg total) by mouth every 6 (six) hours as needed for mild pain.   albuterol (VENTOLIN HFA) 108 (90 Base) MCG/ACT inhaler Inhale 2 puffs into the lungs every 6 (six) hours as needed for wheezing or shortness of breath.   amLODipine (NORVASC) 10 MG tablet TAKE 1 TABLET(10 MG) BY MOUTH DAILY   atorvastatin (LIPITOR) 40 MG tablet Take 1 tablet (40 mg total) by mouth daily.   Cholecalciferol (VITAMIN D) 2000 UNITS tablet Take 2,000 Units by mouth daily.   clopidogrel (PLAVIX) 75 MG tablet TAKE 1 TABLET(75 MG) BY MOUTH DAILY    cyclobenzaprine (FLEXERIL) 10 MG tablet TAKE 1 TABLET(10 MG) BY MOUTH AT BEDTIME (Patient not taking: Reported on 03/21/2022)   cyclobenzaprine (FLEXERIL) 5 MG tablet    fluconazole (DIFLUCAN) 150 MG tablet Take 1 tablet for yeast infection, PO, repeat in 4 days if symptoms remain. (Patient not taking: Reported on 03/21/2022)   fluticasone (FLONASE) 50 MCG/ACT nasal spray Place 2 sprays into both nostrils daily.   loratadine (CLARITIN) 10 MG tablet Take 1 tablet (10 mg total) by mouth daily.   meloxicam (MOBIC) 15 MG tablet Take 1 tablet every day by oral route with food.   meloxicam (MOBIC)  7.5 MG tablet    Omega-3 Fatty Acids (FISH OIL) 1200 MG CAPS Take 1 capsule by mouth daily.   oxyCODONE (OXY IR/ROXICODONE) 5 MG immediate release tablet Take 1 tablet (5 mg total) by mouth every 4 (four) hours as needed for severe pain.   pantoprazole (PROTONIX) 40 MG tablet Take 40 mg by mouth daily.   potassium chloride (KLOR-CON) 10 MEQ tablet Take 10 mEq by mouth daily. M-F only   predniSONE (STERAPRED UNI-PAK 21 TAB) 10 MG (21) TBPK tablet Day 1 & 2 take 6 tablets Day 3 &4 take 5 tablets Day 5 &6 take 4 tablets Day 7 & 8 take 3 tablets Day 9 & 10 take 2 tablets Day 11 & 12 take 1 tablet Day 13 & 14 take 1/2 tablet (Patient not taking: Reported on 03/21/2022)   simvastatin (ZOCOR) 10 MG tablet    simvastatin (ZOCOR) 20 MG tablet    traMADol (ULTRAM) 50 MG tablet    No facility-administered medications prior to visit.    Review of Systems  {Labs  Heme  Chem  Endocrine  Serology  Results Review (optional):23779}  Objective    There were no vitals taken for this visit. {Show previous vital signs (optional):23777}   Physical Exam  ***  Last depression screening scores    02/16/2022   11:33 AM 08/09/2021    1:55 PM 03/25/2021   10:57 AM  PHQ 2/9 Scores  PHQ - 2 Score $Remov'2 1 1  'UcomMQ$ PHQ- 9 Score $Remov'7 2 1   'BIwoHh$ Last fall risk screening    02/16/2022   11:32 AM  [place] in the past year? 0   Number falls in past yr: 0  Injury with Fall? 0  Risk for fall due to : No Fall Risks   Last Audit-C alcohol use screening    02/16/2022   11:32 AM  Alcohol Use Disorder Test (AUDIT)  1. How often do you have a drink containing alcohol? 0  2. How many drinks containing alcohol do you have on a typical day when you are drinking? 0  3. How often do you have six or more drinks on one occasion? 0  AUDIT-C Score 0   A score of 3 or more in women, and 4 or more in men indicates increased risk for alcohol abuse, EXCEPT if all of the points are from question 1   No results found for any visits on 04/20/22.  Assessment & Plan    Routine Health Maintenance and Physical Exam  Exercise Activities and Dietary recommendations  Goals      Exercise 3x per week (30 min per time)     Recommend to walk 3 days a week for at least 30 minutes at a time.      Increase water intake     Recommend increasing water intake to 4 glasses a day.         Immunization History  Administered Date(s) Administered   Influenza-Unspecified 03/07/2017, 06/14/2018   MMR 09/09/1987   [place] Pediatric Vaccination 47mos to <60yrs 07/28/2020, 08/25/2020   Tdap 09/30/2008, 01/02/2019   Zoster Recombinat (Shingrix) 06/27/2019    Health Maintenance  Topic Date Due   Zoster Vaccines- Shingrix (2 of 2) 08/22/2019   COVID-19 Vaccine (1) 09/22/2020   Pneumonia Vaccine 84+ Years old (1 - PCV) Never done   DEXA SCAN  Never done   INFLUENZA VACCINE  02/22/2022   MAMMOGRAM  04/09/2022   COLONOSCOPY (Pts 45-61yrs  Insurance coverage will need to be confirmed)  02/04/2027   TETANUS/TDAP  01/01/2029   Hepatitis C Screening  Completed   HPV VACCINES  Aged Out    Discussed health benefits of physical activity, and encouraged her to engage in regular exercise appropriate for her age and condition.  ***  No follow-ups on file.     {provider attestation***:1}   Gwyneth Sprout, [place] [phone] (phone) [phone] (fax)  [place]

## 2022-04-18 ENCOUNTER — Telehealth: Payer: Self-pay

## 2022-04-18 NOTE — Telephone Encounter (Signed)
Left VM to call back to r/s

## 2022-04-18 NOTE — Telephone Encounter (Unsigned)
Copied from [place] [id number]. Topic: Appointment Scheduling - Scheduling Inquiry for Clinic >> Apr 18, 2022  9:11 AM Ludger Nutting wrote: Patient called to reschedule her AWV that is on 9/27. Please follow up with patient.

## 2022-04-19 ENCOUNTER — Other Ambulatory Visit: Payer: Self-pay

## 2022-04-19 ENCOUNTER — Ambulatory Visit (INDEPENDENT_AMBULATORY_CARE_PROVIDER_SITE_OTHER): Payer: Medicare Other | Admitting: Physician Assistant

## 2022-04-19 ENCOUNTER — Encounter: Payer: Self-pay | Admitting: Physician Assistant

## 2022-04-19 VITALS — BP 137/72 | HR 59 | Temp 98.4°F | Ht 65.0 in | Wt 170.0 lb

## 2022-04-19 DIAGNOSIS — Z09 Encounter for follow-up examination after completed treatment for conditions other than malignant neoplasm: Secondary | ICD-10-CM

## 2022-04-19 DIAGNOSIS — D1722 Benign lipomatous neoplasm of skin and subcutaneous tissue of left arm: Secondary | ICD-10-CM

## 2022-04-19 NOTE — Telephone Encounter (Signed)
Appointments rescheduled.

## 2022-04-19 NOTE — Patient Instructions (Signed)

## 2022-04-19 NOTE — Progress Notes (Signed)
[facility] POST-OP OFFICE VISIT  04/19/2022  HPI: [name] is a 66 y.o. female 21  days s/p excision of LUE lipoma (4 x 3 cm) with Dr Hampton Abbot   She is doing well She reports she never had any issues with pain No fever, chills Incision is healed well; no drainage No other complaints   Vital signs: BP 137/72   Pulse (!) 59   Temp 98.4 F (36.9 C) (Oral)   Ht '5\' 5"'$  (1.651 m)   Wt 170 lb (77.1 kg)   SpO2 98%   BMI 28.29 kg/m    Physical Exam: Constitutional: Well appearing female, NAD Skin: 5 cm incision to the left antecubital fossa; well healed; no erythema  Assessment/Plan: This is a 66 y.o. female 21 days s/p excision of LUE lipoma (4 x 3 cm) with Dr Hampton Abbot    - Nothing further from surgical perspective  - Reviewed surgical pathology; Lipoma  - She can follow up on as needed basis; She understands to call with questions/concerns  -- Edison Simon, PA-C  [facility] 04/19/2022, 3:49 PM M-F: 7am - 4pm

## 2022-04-20 ENCOUNTER — Encounter: Payer: Medicare Other | Admitting: Family Medicine

## 2022-04-26 ENCOUNTER — Ambulatory Visit (INDEPENDENT_AMBULATORY_CARE_PROVIDER_SITE_OTHER): Payer: Medicare Other | Admitting: Physician Assistant

## 2022-04-26 ENCOUNTER — Encounter: Payer: Self-pay | Admitting: Physician Assistant

## 2022-04-26 ENCOUNTER — Other Ambulatory Visit: Payer: Self-pay | Admitting: Family Medicine

## 2022-04-26 VITALS — BP 134/75 | HR 70 | Temp 98.0°F | Resp 14 | Wt 170.0 lb

## 2022-04-26 DIAGNOSIS — R300 Dysuria: Secondary | ICD-10-CM

## 2022-04-26 LAB — POCT URINALYSIS DIPSTICK
Glucose, UA: POSITIVE — AB
Nitrite, UA: POSITIVE
Protein, UA: POSITIVE — AB
Spec Grav, UA: 1.015 (ref 1.010–1.025)
Urobilinogen, UA: >=8 E.U./dL — AB
pH, UA: 5 (ref 5.0–8.0)

## 2022-04-26 MED ORDER — FLUCONAZOLE 150 MG PO TABS: 150.0000 mg | ORAL_TABLET | Freq: Once | ORAL | 1 refills | Status: AC

## 2022-04-26 MED ORDER — CEPHALEXIN 500 MG PO CAPS: 500.0000 mg | ORAL_CAPSULE | Freq: Two times a day (BID) | ORAL | 0 refills | Status: DC

## 2022-04-26 NOTE — Progress Notes (Signed)
I,Roshena L Chambers,acting as a Education administrator for [employer], PA-C.,have documented all relevant documentation on the behalf of Mardene Speak, PA-C,as directed by  [employer], PA-C while in the presence of [employer], PA-C.   Established patient visit   Patient: [name]   DOB: [birth year]   66 y.o. Female  MRN: [id number] Visit Date: 04/26/2022  Today's healthcare provider: Mardene Speak, PA-C   Chief Complaint  Patient presents with   Dysuria   Subjective    Dysuria  This is a new problem. Episode onset: 4 days ago. The problem has been rapidly worsening. The quality of the pain is described as burning. There has been no fever. Associated symptoms include frequency and urgency. Pertinent negatives include no chills, nausea or vomiting. Associated symptoms comments: Pelvic pressure. Treatments tried: OTC generic AZO. The treatment provided mild relief.    Denies having vaginal discharge  Medications: Outpatient Medications Prior to Visit  Medication Sig   albuterol (VENTOLIN HFA) 108 (90 Base) MCG/ACT inhaler Inhale 2 puffs into the lungs every 6 (six) hours as needed for wheezing or shortness of breath.   amLODipine (NORVASC) 10 MG tablet TAKE 1 TABLET(10 MG) BY MOUTH DAILY   atorvastatin (LIPITOR) 40 MG tablet Take 1 tablet (40 mg total) by mouth daily.   Cholecalciferol (VITAMIN D) 2000 UNITS tablet Take 2,000 Units by mouth daily.   clopidogrel (PLAVIX) 75 MG tablet TAKE 1 TABLET(75 MG) BY MOUTH DAILY   cyclobenzaprine (FLEXERIL) 10 MG tablet TAKE 1 TABLET(10 MG) BY MOUTH AT BEDTIME   fluticasone (FLONASE) 50 MCG/ACT nasal spray Place 2 sprays into both nostrils daily.   loratadine (CLARITIN) 10 MG tablet Take 1 tablet (10 mg total) by mouth daily.   Omega-3 Fatty Acids (FISH OIL) 1200 MG CAPS Take 1 capsule by mouth daily.   pantoprazole (PROTONIX) 40 MG tablet Take 40 mg by mouth daily.   potassium chloride (KLOR-CON) 10 MEQ tablet Take 10 mEq by mouth daily.  M-F only   [DISCONTINUED] fluconazole (DIFLUCAN) 150 MG tablet Take 1 tablet for yeast infection, PO, repeat in 4 days if symptoms remain. (Patient not taking: Reported on 04/26/2022)   [DISCONTINUED] predniSONE (STERAPRED UNI-PAK 21 TAB) 10 MG (21) TBPK tablet Day 1 & 2 take 6 tablets Day 3 &4 take 5 tablets Day 5 &6 take 4 tablets Day 7 & 8 take 3 tablets Day 9 & 10 take 2 tablets Day 11 & 12 take 1 tablet Day 13 & 14 take 1/2 tablet (Patient not taking: Reported on 04/26/2022)   No facility-administered medications prior to visit.    Review of Systems  Constitutional:  Negative for appetite change, chills, fatigue and fever.  Respiratory:  Negative for chest tightness and shortness of breath.   Cardiovascular:  Negative for chest pain and palpitations.  Gastrointestinal:  Negative for abdominal pain, nausea and vomiting.  Genitourinary:  Positive for dysuria, frequency, pelvic pain and urgency.  Neurological:  Negative for dizziness and weakness.       Objective    BP 134/75 (BP Location: Right Arm, Patient Position: Sitting, Cuff Size: Normal)   Pulse 70   Temp 98 F (36.7 C) (Oral)   Resp 14   Wt 170 lb (77.1 kg)   SpO2 97% Comment: room air  BMI 28.29 kg/m    Physical Exam Vitals reviewed.  Constitutional:      General: She is not in acute distress.    Appearance: She is well-developed.  HENT:  Head: Normocephalic and atraumatic.  Eyes:     General: No scleral icterus.    Conjunctiva/sclera: Conjunctivae normal.  Cardiovascular:     Rate and Rhythm: Normal rate and regular rhythm.     Heart sounds: Normal heart sounds. No murmur heard. Pulmonary:     Effort: Pulmonary effort is normal. No respiratory distress.     Breath sounds: Normal breath sounds. No wheezing or rales.  Abdominal:     General: There is no distension.     Palpations: Abdomen is soft.     Tenderness: There is abdominal tenderness in the suprapubic area. There is no guarding or rebound.   Skin:    General: Skin is warm and dry.     Capillary Refill: Capillary refill takes less than 2 seconds.     Findings: No rash.  Neurological:     Mental Status: She is alert and oriented to person, place, and time.  Psychiatric:        Behavior: Behavior normal.       No results found for any visits on 04/26/22.  Assessment & Plan     1. Dysuria Could be due to UTI - POCT Urinalysis Dipstick positive for leukocytes, blood - Micro - Urine Culture  Pt had an yeast infection in August of 2023 after taking abx for "viral infection"/Upper respiratory infection Will add diflucan #2 for prevention/Q 72 hours  The patient was advised to call back or seek an in-person evaluation if the symptoms worsen or if the condition fails to improve as anticipated.  I discussed the assessment and treatment plan with the patient. The patient was provided an opportunity to ask questions and all were answered. The patient agreed with the plan and demonstrated an understanding of the instructions.  The entirety of the information documented in the History of Present Illness, Review of Systems and Physical Exam were personally obtained by me. Portions of this information were initially documented by the CMA and reviewed by me for thoroughness and accuracy.  Portions of this note were created using dictation software and may contain typographical errors.    Mardene Speak, PA-C  [facility] [place] [phone] (phone) [phone] (fax)  [place]

## 2022-04-27 LAB — URINALYSIS, MICROSCOPIC ONLY
Bacteria, UA: NONE SEEN
Casts: NONE SEEN /lpf
Epithelial Cells (non renal): NONE SEEN /hpf (ref 0–10)
RBC, Urine: NONE SEEN /hpf (ref 0–2)

## 2022-04-28 LAB — URINE CULTURE

## 2022-04-28 NOTE — Progress Notes (Signed)
Ms Zendejas, your urine culture showed negative results for UTI. You need to discontinue your current medications/ keflex and diflucan. Continue to drink a lot of water Thank you, Mardene Speak, PAC

## 2022-05-06 ENCOUNTER — Ambulatory Visit
Admission: RE | Admit: 2022-05-06 | Discharge: 2022-05-06 | Disposition: A | Discharge status: Home / Self Care | Payer: Medicare Other | Source: Ambulatory Visit | Attending: Family Medicine | Admitting: Family Medicine

## 2022-05-06 DIAGNOSIS — Z1231 Encounter for screening mammogram for malignant neoplasm of breast: Secondary | ICD-10-CM | POA: Insufficient documentation

## 2022-05-06 NOTE — Progress Notes (Signed)
Hi Melessia  Normal mammogram; repeat in 1 year.  Please let us know if you have any questions.  Thank you,  Tally Joe, FNP

## 2022-05-08 ENCOUNTER — Other Ambulatory Visit: Payer: Self-pay | Admitting: Family Medicine

## 2022-05-12 NOTE — Progress Notes (Unsigned)
Annual Wellness Visit     Patient: [name], Female    DOB: 09/06/1955, 66 y.o.   MRN: [id number] Visit Date: 05/19/2022  Today's Provider: Gwyneth Sprout, FNP   No chief complaint on file.  Subjective    [name] is a 66 y.o. female who presents today for her Annual Wellness Visit. She reports consuming a {diet types:17450} diet. {Exercise:19826} She generally feels {well/fairly well/poorly:18703}. She reports sleeping {well/fairly well/poorly:18703}. She {does/does not:200015} have additional problems to discuss today.   HPI    Medications: Outpatient Medications Prior to Visit  Medication Sig   albuterol (VENTOLIN HFA) 108 (90 Base) MCG/ACT inhaler INHALE 2 INHALATIONS BY MOUTH  INTO THE LUNGS EVERY 6 HOURS AS  NEEDED FOR SHORTNESS OF BREATH  OR WHEEZING   amLODipine (NORVASC) 10 MG tablet TAKE 1 TABLET(10 MG) BY MOUTH DAILY   atorvastatin (LIPITOR) 40 MG tablet Take 1 tablet (40 mg total) by mouth daily.   cephALEXin (KEFLEX) 500 MG capsule Take 1 capsule (500 mg total) by mouth 2 (two) times daily.   Cholecalciferol (VITAMIN D) 2000 UNITS tablet Take 2,000 Units by mouth daily.   clopidogrel (PLAVIX) 75 MG tablet TAKE 1 TABLET(75 MG) BY MOUTH DAILY   cyclobenzaprine (FLEXERIL) 10 MG tablet TAKE 1 TABLET(10 MG) BY MOUTH AT BEDTIME   fluticasone (FLONASE) 50 MCG/ACT nasal spray Place 2 sprays into both nostrils daily.   loratadine (CLARITIN) 10 MG tablet Take 1 tablet (10 mg total) by mouth daily.   Omega-3 Fatty Acids (FISH OIL) 1200 MG CAPS Take 1 capsule by mouth daily.   pantoprazole (PROTONIX) 40 MG tablet Take 40 mg by mouth daily.   potassium chloride (KLOR-CON) 10 MEQ tablet Take 10 mEq by mouth daily. M-F only   No facility-administered medications prior to visit.    Allergies  Allergen Reactions   Aspirin Nausea Only   Hydrochlorothiazide     Rash   Ibuprofen Other (See Comments)    Pt says her doctor doesn't want her taking this with the  blood thinner she's taking   Lisinopril     Other reaction(s): Unknown   Sulfa Antibiotics Hives and Nausea Only    Patient Care Team: Gwyneth Sprout, FNP as PCP - General (Family Medicine) [name], MD as Consulting Physician (Gastroenterology) Kary Kos, MD as Consulting Physician (Neurosurgery)  Review of Systems  {Labs  Heme  Chem  Endocrine  Serology  Results Review (optional):23779}    Objective    Vitals: There were no vitals taken for this visit. {Show previous vital signs (optional):23777}   Physical Exam ***  Most recent functional status assessment:    04/20/2022    1:19 PM  In your present state of health, do you have any difficulty performing the following activities:  Hearing? 0  Vision? 0  Difficulty concentrating or making decisions? 0  Walking or climbing stairs? 0  Dressing or bathing? 0  Doing errands, shopping? 0  Preparing Food and eating ? N  Using the Toilet? N  In the past six months, have you accidently leaked urine? N  Do you have problems with loss of bowel control? N  Managing your Medications? N  Managing your Finances? N  Housekeeping or managing your Housekeeping? N   Most recent fall risk assessment:    04/20/2022    1:19 PM  [place] in the past year? 0    Most recent depression screenings:  02/16/2022   11:33 AM 08/09/2021    1:55 PM  PHQ 2/9 Scores  PHQ - 2 Score 2 1  PHQ- 9 Score 7 2   Most recent cognitive screening:    03/25/2021   10:50 AM  6CIT Screen  What Year? 0 points  What month? 0 points  What time? 0 points  Count back from 20 0 points  Months in reverse 0 points  Repeat phrase 0 points  Total Score 0 points   Most recent Audit-C alcohol use screening    04/20/2022    1:19 PM  Alcohol Use Disorder Test (AUDIT)  1. How often do you have a drink containing alcohol? 0  3. How often do you have six or more drinks on one occasion? 0   A score of 3 or more in women, and 4 or more in  men indicates increased risk for alcohol abuse, EXCEPT if all of the points are from question 1   No results found for any visits on 05/19/22.  Assessment & Plan     Annual wellness visit done today including the all of the following: Reviewed patient's Family Medical History Reviewed and updated list of patient's medical providers Assessment of cognitive impairment was done Assessed patient's functional ability Established a written schedule for health screening services Health Risk Assessent Completed and Reviewed  Exercise Activities and Dietary recommendations  Goals      Exercise 3x per week (30 min per time)     Recommend to walk 3 days a week for at least 30 minutes at a time.      Increase water intake     Recommend increasing water intake to 4 glasses a day.         Immunization History  Administered Date(s) Administered   Influenza-Unspecified 03/07/2017, 06/14/2018   MMR 09/09/1987   [place] Pediatric Vaccination 74mos to <70yrs 07/28/2020, 08/25/2020   Tdap 09/30/2008, 01/02/2019   Zoster Recombinat (Shingrix) 06/27/2019    Health Maintenance  Topic Date Due   Zoster Vaccines- Shingrix (2 of 2) 08/22/2019   COVID-19 Vaccine (1) 09/22/2020   Pneumonia Vaccine 27+ Years old (1 - PCV) Never done   DEXA SCAN  Never done   INFLUENZA VACCINE  02/22/2022   MAMMOGRAM  05/07/2023   COLONOSCOPY (Pts 45-62yrs Insurance coverage will need to be confirmed)  02/04/2027   TETANUS/TDAP  01/01/2029   Hepatitis C Screening  Completed   HPV VACCINES  Aged Out     Discussed health benefits of physical activity, and encouraged her to engage in regular exercise appropriate for her age and condition.    ***  No follow-ups on file.     {provider attestation***:1}   Gwyneth Sprout, [place] [phone] (phone) [phone] (fax)  [place]

## 2022-05-17 ENCOUNTER — Ambulatory Visit (INDEPENDENT_AMBULATORY_CARE_PROVIDER_SITE_OTHER): Payer: Medicare Other

## 2022-05-17 VITALS — Ht 65.0 in | Wt 170.0 lb

## 2022-05-17 DIAGNOSIS — Z0000 Encounter for general adult medical examination without abnormal findings: Secondary | ICD-10-CM

## 2022-05-17 NOTE — Progress Notes (Signed)
Virtual Visit via Telephone Note  I connected with  [name] on 05/17/22 at 11:00 AM EDT by telephone and verified that I am speaking with the correct person using two identifiers.  Location: Patient: home Provider: [facility] Persons participating in the virtual visit: [place]   I discussed the limitations, risks, security and privacy concerns of performing an evaluation and management service by telephone and the availability of in person appointments. The patient expressed understanding and agreed to proceed.  Interactive audio and video telecommunications were attempted between this nurse and patient, however failed, due to patient having technical difficulties OR patient did not have access to video capability.  We continued and completed visit with audio only.  Some vital signs may be absent or patient reported.   Dionisio David, LPN  Subjective:   [name] is a 66 y.o. female who presents for Medicare Annual (Subsequent) preventive examination.  Review of Systems     Cardiac Risk Factors include: advanced age (>1men, >34 women);hypertension     Objective:    Today's Vitals   05/17/22 1117  Weight: 170 lb (77.1 kg)   Body mass index is 28.29 kg/m.     05/17/2022   11:08 AM 03/29/2022    6:25 AM 03/21/2022    9:34 AM 08/18/2021   10:02 AM 07/27/2021    7:55 AM 03/19/2020   10:08 AM 02/04/2020    9:58 AM  Advanced Directives  Does Patient Have a Medical Advance Directive? No No No No No Yes No  Type of Teacher, early years/pre;Living will   Copy of [place] in Chart?      No - copy requested   Would patient like information on creating a medical advance directive? No - Patient declined   Yes (MAU/Ambulatory/Procedural Areas - Information given) No - Patient declined  No - Patient declined    Current Medications (verified) Outpatient Encounter Medications as of 05/17/2022  Medication Sig    albuterol (VENTOLIN HFA) 108 (90 Base) MCG/ACT inhaler INHALE 2 INHALATIONS BY MOUTH  INTO THE LUNGS EVERY 6 HOURS AS  NEEDED FOR SHORTNESS OF BREATH  OR WHEEZING   amLODipine (NORVASC) 10 MG tablet TAKE 1 TABLET(10 MG) BY MOUTH DAILY   atorvastatin (LIPITOR) 40 MG tablet Take 1 tablet (40 mg total) by mouth daily.   Cholecalciferol (VITAMIN D) 2000 UNITS tablet Take 2,000 Units by mouth daily.   clopidogrel (PLAVIX) 75 MG tablet TAKE 1 TABLET(75 MG) BY MOUTH DAILY   cyclobenzaprine (FLEXERIL) 10 MG tablet TAKE 1 TABLET(10 MG) BY MOUTH AT BEDTIME   fluticasone (FLONASE) 50 MCG/ACT nasal spray Place 2 sprays into both nostrils daily.   Omega-3 Fatty Acids (FISH OIL) 1200 MG CAPS Take 1 capsule by mouth daily.   pantoprazole (PROTONIX) 40 MG tablet Take 40 mg by mouth daily.   potassium chloride (KLOR-CON) 10 MEQ tablet Take 10 mEq by mouth daily. M-F only   cephALEXin (KEFLEX) 500 MG capsule Take 1 capsule (500 mg total) by mouth 2 (two) times daily. (Patient not taking: Reported on 05/17/2022)   loratadine (CLARITIN) 10 MG tablet Take 1 tablet (10 mg total) by mouth daily. (Patient not taking: Reported on 05/17/2022)   No facility-administered encounter medications on file as of 05/17/2022.    Allergies (verified) Aspirin, Hydrochlorothiazide, Ibuprofen, Lisinopril, and Sulfa antibiotics   History: Past Medical History:  Diagnosis Date   Allergy    Anemia  takes Ferrous Sulfate daily   Arthritis    GERD (gastroesophageal reflux disease)    takes OTC med   Heart murmur    Hyperlipidemia    Hypertension    takes Amlodipine daily   Low back pain    occasionally   Past Surgical History:  Procedure Laterality Date   ANTERIOR CERVICAL DECOMP/DISCECTOMY FUSION  10/19/2011   Procedure: ANTERIOR CERVICAL DECOMPRESSION/DISCECTOMY FUSION 1 LEVEL/HARDWARE REMOVAL;  Surgeon: Elaina Hoops, MD;  Location: [facility];  Service: Neurosurgery;  Laterality: N/A;  Cervical four - five  Anterior  cervical decompression fusion with redo at six - seven  Rm 33   CARPAL TUNNEL RELEASE     COLONOSCOPY  08/2014   polyps   COLONOSCOPY WITH PROPOFOL N/A 02/04/2020   Procedure: COLONOSCOPY WITH PROPOFOL;  Surgeon: [name], MD;  Location: [facility];  Service: Endoscopy;  Laterality: N/A;   ESOPHAGOGASTRODUODENOSCOPY (EGD) WITH PROPOFOL N/A 06/23/2015   Procedure: ESOPHAGOGASTRODUODENOSCOPY (EGD) WITH PROPOFOL;  Surgeon: [name], MD;  Location: [facility] ENDOSCOPY;  Service: Endoscopy;  Laterality: N/A;   LIPOMA EXCISION Left 03/29/2022   Procedure: EXCISION LIPOMA, left upper extremity;  Surgeon: Olean Ree, MD;  Location: [facility] ORS;  Service: General;  Laterality: Left;   NECK SURGERY     POSTERIOR CERVICAL FUSION/FORAMINOTOMY N/A 08/26/2013   Procedure: C/4-5,C/5-6,C/6-7 Posterior Cervical Fusion w/lateral mass fixation;  Surgeon: Elaina Hoops, MD;  Location: [place] [facility];  Service: Neurosurgery;  Laterality: N/A;   TUBAL LIGATION     Family History  Problem Relation Age of Onset   Hypertension Mother    Heart disease Mother    Kidney failure Sister    Breast cancer Neg Hx    Social History   Socioeconomic History   Marital status: Single    Spouse name: Not on file   Number of children: 3   Years of education: Not on file   Highest education level: Some college, no degree  Occupational History   Occupation: disable  Tobacco Use   Smoking status: Never   Smokeless tobacco: Never  Vaping Use   Vaping Use: Never used  Substance and Sexual Activity   Alcohol use: No   Drug use: No   Sexual activity: Not on file  Other Topics Concern   Not on file  Social History Narrative   Not on file   Social Determinants of Health   Financial Resource Strain: Medium Risk (05/17/2022)   Overall Financial Resource Strain (CARDIA)    Difficulty of Paying Living Expenses: Somewhat hard  Food Insecurity: No Food Insecurity (05/17/2022)   Hunger Vital Sign    Worried About  Running Out of Food in the Last Year: Never true    Ran Out of Food in the Last Year: Never true  Transportation Needs: No Transportation Needs (05/17/2022)   PRAPARE - Hydrologist (Medical): No    Lack of Transportation (Non-Medical): No  Physical Activity: Insufficiently Active (05/17/2022)   Exercise Vital Sign    Days of Exercise per Week: 3 days    Minutes of Exercise per Session: 20 min  Stress: No Stress Concern Present (05/17/2022)   [place]    Feeling of Stress : Only a little  Social Connections: Socially Isolated (05/17/2022)   Social Connection and Isolation Panel [NHANES]    Frequency of Communication with Friends and Family: Three times a week    Frequency of Social Gatherings  with Friends and Family: Never    Attends Religious Services: Never    Marine scientist or Organizations: No    Attends Music therapist: Never    Marital Status: Divorced    Tobacco Counseling Counseling given: Not Answered   Clinical Intake:  Pre-visit preparation completed: Yes  Pain : No/denies pain     Nutritional Risks: None Diabetes: No  How often do you need to have someone help you when you read instructions, pamphlets, or other written materials from your doctor or pharmacy?: 1 - Never  Diabetic?no  Interpreter Needed?: No  Information entered by :: Kirke Shaggy, LPN   Activities of Daily Living    05/17/2022   11:09 AM 05/15/2022    1:22 PM  In your present state of health, do you have any difficulty performing the following activities:  Hearing? 0 0  Vision? 0 0  Difficulty concentrating or making decisions? 0 0  Walking or climbing stairs? 0 1  Dressing or bathing? 0 0  Doing errands, shopping? 0 0  Preparing Food and eating ? N N  Using the Toilet? N N  In the past six months, have you accidently leaked urine? N N  Do you have problems  with loss of bowel control? N N  Managing your Medications? N N  Managing your Finances? N N  Housekeeping or managing your Housekeeping? N N    Patient Care Team: Gwyneth Sprout, FNP as PCP - General (Family Medicine) [name], MD as Consulting Physician (Gastroenterology) Kary Kos, MD as Consulting Physician (Neurosurgery)  Indicate any recent Medical Services you may have received from other than [facility] providers in the past year (date may be approximate).     Assessment:   This is a routine wellness examination for [name].  Hearing/Vision screen Hearing Screening - Comments:: No aids Vision Screening - Comments:: Readers-   Dietary issues and exercise activities discussed: Current Exercise Habits: Home exercise routine, Type of exercise: walking, Time (Minutes): 20, Frequency (Times/Week): 3, Weekly Exercise (Minutes/Week): 60   Goals Addressed             This Visit's Progress    DIET - EAT MORE FRUITS AND VEGETABLES         Depression Screen    05/17/2022   11:05 AM 02/16/2022   11:33 AM 08/09/2021    1:55 PM 03/25/2021   10:57 AM 03/19/2020    9:57 AM 03/05/2020    9:48 AM 01/02/2019   10:33 AM  PHQ 2/9 Scores  PHQ - 2 Score 0 $Remov'2 1 1 'sDDaSW$ 0 0 4  PHQ- 9 Score 0 $Remov'7 2 1   6    'NNdlYQ$ Fall Risk    05/17/2022   11:08 AM 05/15/2022    1:22 PM 04/20/2022    1:19 PM 04/19/2022    3:40 PM 02/16/2022   11:32 AM  Fall Risk   Falls in the past year? 0 0 0 0 0  Number falls in past yr: 0    0  Injury with Fall? 0    0  Risk for fall due to : No Fall Risks    No Fall Risks  Follow up Falls prevention discussed;Falls evaluation completed        FALL RISK PREVENTION PERTAINING TO THE HOME:  Any stairs in or around the home? No  If so, are there any without handrails? No  Home free of loose throw rugs in walkways, pet beds, electrical cords, etc?  Yes  Adequate lighting in your home to reduce risk of falls? Yes   ASSISTIVE DEVICES UTILIZED TO PREVENT FALLS:  Life alert? No   Use of a cane, walker or w/c? No  Grab bars in the bathroom? Yes  Shower chair or bench in shower? No  Elevated toilet seat or a handicapped toilet? No    Cognitive Function:        05/17/2022   11:09 AM 03/25/2021   10:50 AM 03/19/2020   10:12 AM 12/07/2016    3:29 PM  6CIT Screen  What Year? 0 points 0 points 0 points 0 points  What month? 0 points 0 points 0 points 0 points  What time? 0 points 0 points 0 points 0 points  Count back from 20 0 points 0 points 0 points 0 points  Months in reverse 0 points 0 points 2 points 0 points  Repeat phrase 0 points 0 points 0 points 0 points  Total Score 0 points 0 points 2 points 0 points    Immunizations Immunization History  Administered Date(s) Administered   Influenza-Unspecified 03/07/2017, 06/14/2018   MMR 09/09/1987   [place] COV-2 Pediatric Vaccination 72mos to <18yrs 07/28/2020, 08/25/2020   Tdap 09/30/2008, 01/02/2019   Zoster Recombinat (Shingrix) 06/27/2019    TDAP status: Due, Education has been provided regarding the importance of this vaccine. Advised may receive this vaccine at local pharmacy or Health Dept. Aware to provide a copy of the vaccination record if obtained from local pharmacy or Health Dept. Verbalized acceptance and understanding.  Flu Vaccine status: Up to date  Pneumococcal vaccine status: Up to date  Covid-19 vaccine status: Completed vaccines  Qualifies for Shingles Vaccine? Yes   Zostavax completed No   Shingrix Completed?: No.    Education has been provided regarding the importance of this vaccine. Patient has been advised to call insurance company to determine out of pocket expense if they have not yet received this vaccine. Advised may also receive vaccine at local pharmacy or Health Dept. Verbalized acceptance and understanding.  Screening Tests Health Maintenance  Topic Date Due   Zoster Vaccines- Shingrix (2 of 2) 08/22/2019   COVID-19 Vaccine (1) 09/22/2020   Pneumonia Vaccine 5+  Years old (1 - PCV) Never done   DEXA SCAN  Never done   INFLUENZA VACCINE  02/22/2022   MAMMOGRAM  05/07/2023   Medicare Annual Wellness (AWV)  06/17/2023   COLONOSCOPY (Pts 45-64yrs Insurance coverage will need to be confirmed)  02/04/2027   TETANUS/TDAP  01/01/2029   Hepatitis C Screening  Completed   HPV VACCINES  Aged Out    Health Maintenance  Health Maintenance Due  Topic Date Due   Zoster Vaccines- Shingrix (2 of 2) 08/22/2019   COVID-19 Vaccine (1) 09/22/2020   Pneumonia Vaccine 2+ Years old (1 - PCV) Never done   DEXA SCAN  Never done   INFLUENZA VACCINE  02/22/2022    Colorectal cancer screening: Type of screening: Colonoscopy. Completed 02/09/22. Repeat every 5 years  Mammogram status: Completed 02/11/21. Repeat every year- had this yr per pt was normal  Lung Cancer Screening: (Low Dose CT Chest recommended if Age 37-80 years, 30 pack-year currently smoking OR have quit w/in 15years.) does not qualify.    Additional Screening:  Hepatitis C Screening: does qualify; Completed 12/16/15  Vision Screening: Recommended annual ophthalmology exams for early detection of glaucoma and other disorders of the eye. Is the patient up to date with their annual eye exam?  No  Who is the provider or what is the name of the office in which the patient attends annual eye exams? No one If pt is not established with a provider, would they like to be referred to a provider to establish care? No .   Dental Screening: Recommended annual dental exams for proper oral hygiene  Community Resource Referral / Chronic Care Management: CRR required this visit?  No   CCM required this visit?  No      Plan:     I have personally reviewed and noted the following in the patient's chart:   Medical and social history Use of alcohol, tobacco or illicit drugs  Current medications and supplements including opioid prescriptions. Patient is not currently taking opioid prescriptions. Functional  ability and status Nutritional status Physical activity Advanced directives List of other physicians Hospitalizations, surgeries, and ER visits in previous 12 months Vitals Screenings to include cognitive, depression, and falls Referrals and appointments  In addition, I have reviewed and discussed with patient certain preventive protocols, quality metrics, and best practice recommendations. A written personalized care plan for preventive services as well as general preventive health recommendations were provided to patient.     Dionisio David, LPN   [id number]   Nurse Notes: none

## 2022-05-17 NOTE — Patient Instructions (Signed)
[name] , Thank you for taking time to come for your Medicare Wellness Visit. I appreciate your ongoing commitment to your health goals. Please review the following plan we discussed and let me know if I can assist you in the future.   Screening recommendations/referrals: Colonoscopy: 02/09/22 Mammogram: 02/11/21 Recommended yearly ophthalmology/optometry visit for glaucoma screening and checkup Recommended yearly dental visit for hygiene and checkup  Vaccinations: Influenza vaccine: 05/09/22 Pneumococcal vaccine: 03/18/14 Tdap vaccine: n/d Shingles vaccine: Shingrix 06/27/19   Covid-19:07/28/20, 08/25/20  Advanced directives: no  Conditions/risks identified: none  Next appointment: Follow up in one year for your annual wellness visit 05/22/23 @ 10:30 am by phone   Preventive Care 65 Years and Older, Female Preventive care refers to lifestyle choices and visits with your health care provider that can promote health and wellness. What does preventive care include? A yearly physical exam. This is also called an annual well check. Dental exams once or twice a year. Routine eye exams. Ask your health care provider how often you should have your eyes checked. Personal lifestyle choices, including: Daily care of your teeth and gums. Regular physical activity. Eating a healthy diet. Avoiding tobacco and drug use. Limiting alcohol use. Practicing safe sex. Taking low-dose aspirin every day. Taking vitamin and mineral supplements as recommended by your health care provider. What happens during an annual well check? The services and screenings done by your health care provider during your annual well check will depend on your age, overall health, lifestyle risk factors, and family history of disease. Counseling  Your health care provider may ask you questions about your: Alcohol use. Tobacco use. Drug use. Emotional well-being. Home and relationship well-being. Sexual  activity. Eating habits. History of falls. Memory and ability to understand (cognition). Work and work Statistician. Reproductive health. Screening  You may have the following tests or measurements: Height, weight, and BMI. Blood pressure. Lipid and cholesterol levels. These may be checked every 5 years, or more frequently if you are over 41 years old. Skin check. Lung cancer screening. You may have this screening every year starting at age 77 if you have a 30-pack-year history of smoking and currently smoke or have quit within the past 15 years. Fecal occult blood test (FOBT) of the stool. You may have this test every year starting at age 76. Flexible sigmoidoscopy or colonoscopy. You may have a sigmoidoscopy every 5 years or a colonoscopy every 10 years starting at age 56. Hepatitis C blood test. Hepatitis B blood test. Sexually transmitted disease (STD) testing. Diabetes screening. This is done by checking your blood sugar (glucose) after you have not eaten for a while (fasting). You may have this done every 1-3 years. Bone density scan. This is done to screen for osteoporosis. You may have this done starting at age 79. Mammogram. This may be done every 1-2 years. Talk to your health care provider about how often you should have regular mammograms. Talk with your health care provider about your test results, treatment options, and if necessary, the need for more tests. Vaccines  Your health care provider may recommend certain vaccines, such as: Influenza vaccine. This is recommended every year. Tetanus, diphtheria, and acellular pertussis (Tdap, Td) vaccine. You may need a Td booster every 10 years. Zoster vaccine. You may need this after age 30. Pneumococcal 13-valent conjugate (PCV13) vaccine. One dose is recommended after age 55. Pneumococcal polysaccharide (PPSV23) vaccine. One dose is recommended after age 70. Talk to your health care provider about which  screenings and vaccines  you need and how often you need them. This information is not intended to replace advice given to you by your health care provider. Make sure you discuss any questions you have with your health care provider. Document Released: 08/07/2015 Document Revised: 03/30/2016 Document Reviewed: 05/12/2015 Elsevier Interactive Patient Education  2017 [place] Prevention in the Home Falls can cause injuries. They can happen to people of all ages. There are many things you can do to make your home safe and to help prevent falls. What can I do on the outside of my home? Regularly fix the edges of walkways and driveways and fix any cracks. Remove anything that might make you trip as you walk through a door, such as a raised step or threshold. Trim any bushes or trees on the path to your home. Use bright outdoor lighting. Clear any walking paths of anything that might make someone trip, such as rocks or tools. Regularly check to see if handrails are loose or broken. Make sure that both sides of any steps have handrails. Any raised decks and porches should have guardrails on the edges. Have any leaves, snow, or ice cleared regularly. Use sand or salt on walking paths during winter. Clean up any spills in your garage right away. This includes oil or grease spills. What can I do in the bathroom? Use night lights. Install grab bars by the toilet and in the tub and shower. Do not use towel bars as grab bars. Use non-skid mats or decals in the tub or shower. If you need to sit down in the shower, use a plastic, non-slip stool. Keep the floor dry. Clean up any water that spills on the floor as soon as it happens. Remove soap buildup in the tub or shower regularly. Attach bath mats securely with double-sided non-slip rug tape. Do not have throw rugs and other things on the floor that can make you trip. What can I do in the bedroom? Use night lights. Make sure that you have a light by your bed that  is easy to reach. Do not use any sheets or blankets that are too big for your bed. They should not hang down onto the floor. Have a firm chair that has side arms. You can use this for support while you get dressed. Do not have throw rugs and other things on the floor that can make you trip. What can I do in the kitchen? Clean up any spills right away. Avoid walking on wet floors. Keep items that you use a lot in easy-to-reach places. If you need to reach something above you, use a strong step stool that has a grab bar. Keep electrical cords out of the way. Do not use floor polish or wax that makes floors slippery. If you must use wax, use non-skid floor wax. Do not have throw rugs and other things on the floor that can make you trip. What can I do with my stairs? Do not leave any items on the stairs. Make sure that there are handrails on both sides of the stairs and use them. Fix handrails that are broken or loose. Make sure that handrails are as long as the stairways. Check any carpeting to make sure that it is firmly attached to the stairs. Fix any carpet that is loose or worn. Avoid having throw rugs at the top or bottom of the stairs. If you do have throw rugs, attach them to the floor with carpet  tape. Make sure that you have a light switch at the top of the stairs and the bottom of the stairs. If you do not have them, ask someone to add them for you. What else can I do to help prevent falls? Wear shoes that: Do not have high heels. Have rubber bottoms. Are comfortable and fit you well. Are closed at the toe. Do not wear sandals. If you use a stepladder: Make sure that it is fully opened. Do not climb a closed stepladder. Make sure that both sides of the stepladder are locked into place. Ask someone to hold it for you, if possible. Clearly mark and make sure that you can see: Any grab bars or handrails. First and last steps. Where the edge of each step is. Use tools that help you  move around (mobility aids) if they are needed. These include: Canes. Walkers. Scooters. Crutches. Turn on the lights when you go into a dark area. Replace any light bulbs as soon as they burn out. Set up your furniture so you have a clear path. Avoid moving your furniture around. If any of your floors are uneven, fix them. If there are any pets around you, be aware of where they are. Review your medicines with your doctor. Some medicines can make you feel dizzy. This can increase your chance of falling. Ask your doctor what other things that you can do to help prevent falls. This information is not intended to replace advice given to you by your health care provider. Make sure you discuss any questions you have with your health care provider. Document Released: 05/07/2009 Document Revised: 12/17/2015 Document Reviewed: 08/15/2014 Elsevier Interactive Patient Education  2017 [employer].

## 2022-05-19 ENCOUNTER — Other Ambulatory Visit: Payer: Self-pay | Admitting: Family Medicine

## 2022-05-19 ENCOUNTER — Encounter: Payer: Self-pay | Admitting: Family Medicine

## 2022-05-19 ENCOUNTER — Ambulatory Visit (INDEPENDENT_AMBULATORY_CARE_PROVIDER_SITE_OTHER): Payer: Medicare Other | Admitting: Family Medicine

## 2022-05-19 VITALS — BP 108/66 | HR 61 | Temp 98.1°F | Resp 16 | Ht 65.0 in | Wt 171.2 lb

## 2022-05-19 DIAGNOSIS — Z23 Encounter for immunization: Secondary | ICD-10-CM | POA: Diagnosis not present

## 2022-05-19 DIAGNOSIS — E7800 Pure hypercholesterolemia, unspecified: Secondary | ICD-10-CM | POA: Diagnosis not present

## 2022-05-19 DIAGNOSIS — E559 Vitamin D deficiency, unspecified: Secondary | ICD-10-CM | POA: Diagnosis not present

## 2022-05-19 DIAGNOSIS — R7303 Prediabetes: Secondary | ICD-10-CM | POA: Diagnosis not present

## 2022-05-19 DIAGNOSIS — Z0000 Encounter for general adult medical examination without abnormal findings: Secondary | ICD-10-CM | POA: Insufficient documentation

## 2022-05-19 DIAGNOSIS — I10 Essential (primary) hypertension: Secondary | ICD-10-CM

## 2022-05-19 DIAGNOSIS — R7989 Other specified abnormal findings of blood chemistry: Secondary | ICD-10-CM | POA: Diagnosis not present

## 2022-05-19 NOTE — Assessment & Plan Note (Signed)
Chronic, stable At goal Continue norvasc 10 mg

## 2022-05-19 NOTE — Assessment & Plan Note (Signed)
Chronic, previously stable LDL <100 Continue lipitor 40 given hx of CVA

## 2022-05-19 NOTE — Patient Instructions (Signed)
Please call and schedule your bone screening :  [facility]  [place], [place],  [place]  [id number] Get Driving Directions Main: [phone]  Sunday:Closed Monday:7:20 AM - 5:00 PM Tuesday:7:20 AM - 5:00 PM Wednesday:7:20 AM - 5:00 PM Thursday:7:20 AM - 5:00 PM Friday:7:20 AM - 4:30 PM Saturday:Closed

## 2022-05-19 NOTE — Assessment & Plan Note (Signed)
Chronic, stable Repeat A1c Controlled with diet and exercise  Continue to recommend balanced, lower carb meals. Smaller meal size, adding snacks. Choosing water as drink of choice and increasing purposeful exercise.

## 2022-05-19 NOTE — Assessment & Plan Note (Signed)
Previously borderline low- repeat labs

## 2022-05-19 NOTE — Assessment & Plan Note (Signed)
Chronic, previously borderline high 30s Repeat labs

## 2022-05-19 NOTE — Assessment & Plan Note (Signed)
Due for DEXA Plans to get 2/2 shingrix at pharm UTD on dental and vision UTD on colon UTD on mammo Things to do to keep yourself healthy  - Exercise at least 30-45 minutes a day, 3-4 days a week.  - Eat a low-fat diet with lots of fruits and vegetables, up to 7-9 servings per day.  - Seatbelts can save your life. Wear them always.  - Smoke detectors on every level of your home, check batteries every year.  - Eye Doctor - have an eye exam every 1-2 years  - Safe sex - if you may be exposed to STDs, use a condom.  - Alcohol -  If you drink, do it moderately, less than 2 drinks per day.  - [name]. Choose someone to speak for you if you are not able.  - Depression is common in our stressful world.If you're feeling down or losing interest in things you normally enjoy, please come in for a visit.  - Violence - If anyone is threatening or hurting you, please call immediately.

## 2022-05-20 LAB — CBC WITH DIFFERENTIAL/PLATELET
Basophils Absolute: 0 10*3/uL (ref 0.0–0.2)
Basos: 1 %
EOS (ABSOLUTE): 0.1 10*3/uL (ref 0.0–0.4)
Eos: 1 %
Hematocrit: 42.4 % (ref 34.0–46.6)
Hemoglobin: 13.5 g/dL (ref 11.1–15.9)
Immature Grans (Abs): 0 10*3/uL (ref 0.0–0.1)
Immature Granulocytes: 0 %
Lymphocytes Absolute: 2.9 10*3/uL (ref 0.7–3.1)
Lymphs: 40 %
MCH: 27.4 pg (ref 26.6–33.0)
MCHC: 31.8 g/dL (ref 31.5–35.7)
MCV: 86 fL (ref 79–97)
Monocytes Absolute: 0.5 10*3/uL (ref 0.1–0.9)
Monocytes: 7 %
Neutrophils Absolute: 3.8 10*3/uL (ref 1.4–7.0)
Neutrophils: 51 %
Platelets: 339 10*3/uL (ref 150–450)
RBC: 4.93 x10E6/uL (ref 3.77–5.28)
RDW: 13.6 % (ref 11.7–15.4)
WBC: 7.2 10*3/uL (ref 3.4–10.8)

## 2022-05-20 LAB — COMPREHENSIVE METABOLIC PANEL
ALT: 8 IU/L (ref 0–32)
AST: 13 IU/L (ref 0–40)
Albumin/Globulin Ratio: 1.5 (ref 1.2–2.2)
Albumin: 4.3 g/dL (ref 3.9–4.9)
Alkaline Phosphatase: 125 IU/L — ABNORMAL HIGH (ref 44–121)
BUN/Creatinine Ratio: 16 (ref 12–28)
BUN: 13 mg/dL (ref 8–27)
Bilirubin Total: 0.3 mg/dL (ref 0.0–1.2)
CO2: 24 mmol/L (ref 20–29)
Calcium: 10 mg/dL (ref 8.7–10.3)
Chloride: 103 mmol/L (ref 96–106)
Creatinine, Ser: 0.8 mg/dL (ref 0.57–1.00)
Globulin, Total: 2.8 g/dL (ref 1.5–4.5)
Glucose: 113 mg/dL — ABNORMAL HIGH (ref 70–99)
Potassium: 3.9 mmol/L (ref 3.5–5.2)
Sodium: 141 mmol/L (ref 134–144)
Total Protein: 7.1 g/dL (ref 6.0–8.5)
eGFR: 81 mL/min/{1.73_m2} (ref >59)

## 2022-05-20 LAB — LIPID PANEL WITH LDL/HDL RATIO
Cholesterol, Total: 155 mg/dL (ref 100–199)
HDL: 46 mg/dL (ref >39)
LDL Chol Calc (NIH): 88 mg/dL (ref 0–99)
LDL/HDL Ratio: 1.9 ratio (ref 0.0–3.2)
Triglycerides: 116 mg/dL (ref 0–149)
VLDL Cholesterol Cal: 21 mg/dL (ref 5–40)

## 2022-05-20 LAB — VITAMIN D 25 HYDROXY (VIT D DEFICIENCY, FRACTURES): Vit D, 25-Hydroxy: 37.2 ng/mL (ref 30.0–100.0)

## 2022-05-20 LAB — HEMOGLOBIN A1C
Est. average glucose Bld gHb Est-mCnc: 126 mg/dL
Hgb A1c MFr Bld: 6 % — ABNORMAL HIGH (ref 4.8–5.6)

## 2022-05-20 LAB — TSH+FREE T4
Free T4: 1.06 ng/dL (ref 0.82–1.77)
TSH: 0.417 u[IU]/mL — ABNORMAL LOW (ref 0.450–4.500)

## 2022-05-20 NOTE — Progress Notes (Signed)
Slight overcorrection of thyroid; depending on your symptoms can go to a lower dose.  Labs otherwise unremarkable.  Gwyneth Sprout, [address] [place], [place] [address] [phone] (phone) [phone] (fax) [place]

## 2022-05-26 ENCOUNTER — Other Ambulatory Visit: Payer: Self-pay | Admitting: Family Medicine

## 2022-05-26 DIAGNOSIS — I10 Essential (primary) hypertension: Secondary | ICD-10-CM

## 2022-05-30 ENCOUNTER — Telehealth: Payer: Self-pay

## 2022-05-30 NOTE — Telephone Encounter (Signed)
Copied from [place] [id number]. Topic: General - Other >> May 30, 2022  1:35 PM [name] wrote: Reason for CRM: Pt would like to pick up a copy of her completed FL2 paperwork tomorrow / please advise when ready to pick up

## 2022-05-31 ENCOUNTER — Other Ambulatory Visit: Payer: Self-pay | Admitting: Family Medicine

## 2022-05-31 DIAGNOSIS — I10 Essential (primary) hypertension: Secondary | ICD-10-CM

## 2022-06-01 NOTE — Telephone Encounter (Signed)
Unable to refill per protocol, Rx was refilled 05/26/22 for 90.E-Prescribing Status: Receipt confirmed by pharmacy (05/26/2022  2:20 PM EDT). Will refuse duplicate request.  Requested Prescriptions  Pending Prescriptions Disp Refills   amLODipine (NORVASC) 10 MG tablet [Pharmacy Med Name: amLODIPine Besylate 10 MG Oral Tablet] 90 tablet 3    Sig: TAKE 1 TABLET BY MOUTH DAILY     Cardiovascular: Calcium Channel Blockers 2 Passed - 05/31/2022 11:11 PM      Passed - Last BP in normal range    BP Readings from Last 1 Encounters:  05/19/22 108/66         Passed - Last Heart Rate in normal range    Pulse Readings from Last 1 Encounters:  05/19/22 61         Passed - Valid encounter within last 6 months    Recent Outpatient Visits           1 month ago [place], [place], PA-C   3 months ago [place] Tally Joe T, FNP   3 months ago Upper respiratory tract infection, unspecified type   [facility] Thedore Mins, [place], PA-C   8 months ago Prediabetes   [facility] Tally Joe T, FNP   9 months ago Bilateral low back pain with right-sided sciatica, unspecified chronicity   [facility] Myles Gip, DO

## 2022-06-01 NOTE — Telephone Encounter (Signed)
Spoke with patient. She said her "peer support lady" told her we would know what she was talking about and fill it out for her. I advised her we do not have those unless patients bring them to [place]. She said she will reach out to the person that told her about it for more information.

## 2022-06-09 ENCOUNTER — Telehealth: Payer: Self-pay

## 2022-06-09 NOTE — Telephone Encounter (Signed)
There is no message in chart where anyone has tried to call patient/

## 2022-06-09 NOTE — Telephone Encounter (Signed)
Pt called to speak with Tiffany about the FL2 form / please advise

## 2022-06-09 NOTE — Telephone Encounter (Signed)
Copied from [place] [id number]. Topic: General - Other >> Jun 09, 2022  2:18 PM Leitha Schuller wrote: Pt returning call  Please fu w/ pt

## 2022-06-10 ENCOUNTER — Other Ambulatory Visit: Payer: Self-pay | Admitting: Family Medicine

## 2022-06-10 DIAGNOSIS — Z8673 Personal history of transient ischemic attack (TIA), and cerebral infarction without residual deficits: Secondary | ICD-10-CM

## 2022-06-10 NOTE — Telephone Encounter (Signed)
Unable to refill per protocol, Rx request is too soon. Last RF 02/11/22 for  90 and 3 RF. Will refuse.  Requested Prescriptions  Pending Prescriptions Disp Refills   clopidogrel (PLAVIX) 75 MG tablet [Pharmacy Med Name: Clopidogrel Bisulfate 75 MG Oral Tablet] 100 tablet 2    Sig: TAKE 1 TABLET BY MOUTH DAILY     Hematology: Antiplatelets - clopidogrel Passed - 06/10/2022 12:03 AM      Passed - HCT in normal range and within 180 days    Hematocrit  Date Value Ref Range Status  05/19/2022 42.4 34.0 - 46.6 % Final         Passed - HGB in normal range and within 180 days    Hemoglobin  Date Value Ref Range Status  05/19/2022 13.5 11.1 - 15.9 g/dL Final         Passed - PLT in normal range and within 180 days    Platelets  Date Value Ref Range Status  05/19/2022 339 150 - 450 x10E3/uL Final         Passed - Cr in normal range and within 360 days    Creatinine  Date Value Ref Range Status  04/19/2012 0.71 0.60 - 1.30 mg/dL Final   Creatinine, Ser  Date Value Ref Range Status  05/19/2022 0.80 0.57 - 1.00 mg/dL Final         Passed - Valid encounter within last 6 months    Recent Outpatient Visits           1 month ago [place], [place], PA-C   3 months ago [place], Elise T, FNP   3 months ago Upper respiratory tract infection, unspecified type   [facility] Thedore Mins, [place], PA-C   8 months ago Prediabetes   [facility] Tally Joe T, FNP   10 months ago Bilateral low back pain with right-sided sciatica, unspecified chronicity   [facility] Myles Gip, DO

## 2022-06-14 NOTE — Telephone Encounter (Signed)
Spoke with patient again, explained that we do not have FL2 forms. Patient stated understanding that she would reach out to the person who told her about it and find out where to get the forms.

## 2022-06-15 ENCOUNTER — Other Ambulatory Visit: Payer: Self-pay | Admitting: Gastroenterology

## 2022-07-02 ENCOUNTER — Emergency Department
Admission: EM | Admit: 2022-07-02 | Discharge: 2022-07-02 | Disposition: A | Discharge status: Home / Self Care | Payer: Medicare Other | Attending: Emergency Medicine | Admitting: Emergency Medicine

## 2022-07-02 ENCOUNTER — Other Ambulatory Visit: Payer: Self-pay

## 2022-07-02 ENCOUNTER — Emergency Department: Payer: Medicare Other

## 2022-07-02 DIAGNOSIS — R0602 Shortness of breath: Secondary | ICD-10-CM | POA: Diagnosis not present

## 2022-07-02 DIAGNOSIS — R519 Headache, unspecified: Secondary | ICD-10-CM | POA: Diagnosis not present

## 2022-07-02 DIAGNOSIS — U071 COVID-19: Secondary | ICD-10-CM | POA: Insufficient documentation

## 2022-07-02 DIAGNOSIS — R5383 Other fatigue: Secondary | ICD-10-CM | POA: Diagnosis present

## 2022-07-02 DIAGNOSIS — R1110 Vomiting, unspecified: Secondary | ICD-10-CM | POA: Diagnosis not present

## 2022-07-02 LAB — BASIC METABOLIC PANEL
Anion gap: 9 (ref 5–15)
BUN: 11 mg/dL (ref 8–23)
CO2: 24 mmol/L (ref 22–32)
Calcium: 9.3 mg/dL (ref 8.9–10.3)
Chloride: 103 mmol/L (ref 98–111)
Creatinine, Ser: 0.69 mg/dL (ref 0.44–1.00)
GFR, Estimated: >60 mL/min (ref >60)
Glucose, Bld: 119 mg/dL — ABNORMAL HIGH (ref 70–99)
Potassium: 3.4 mmol/L — ABNORMAL LOW (ref 3.5–5.1)
Sodium: 136 mmol/L (ref 135–145)

## 2022-07-02 LAB — CBC WITH DIFFERENTIAL/PLATELET
Abs Immature Granulocytes: 0.02 10*3/uL (ref 0.00–0.07)
Basophils Absolute: 0 10*3/uL (ref 0.0–0.1)
Basophils Relative: 0 %
Eosinophils Absolute: 0 10*3/uL (ref 0.0–0.5)
Eosinophils Relative: 0 %
HCT: 40.1 % (ref 36.0–46.0)
Hemoglobin: 13.2 g/dL (ref 12.0–15.0)
Immature Granulocytes: 0 %
Lymphocytes Relative: 12 %
Lymphs Abs: 0.8 10*3/uL (ref 0.7–4.0)
MCH: 27.6 pg (ref 26.0–34.0)
MCHC: 32.9 g/dL (ref 30.0–36.0)
MCV: 83.9 fL (ref 80.0–100.0)
Monocytes Absolute: 0.7 10*3/uL (ref 0.1–1.0)
Monocytes Relative: 9 %
Neutro Abs: 5.6 10*3/uL (ref 1.7–7.7)
Neutrophils Relative %: 79 %
Platelets: 275 10*3/uL (ref 150–400)
RBC: 4.78 MIL/uL (ref 3.87–5.11)
RDW: 12.8 % (ref 11.5–15.5)
WBC: 7.1 10*3/uL (ref 4.0–10.5)
nRBC: 0 % (ref 0.0–0.2)

## 2022-07-02 LAB — RESP PANEL BY RT-PCR (RSV, FLU A&B, COVID)  RVPGX2
Influenza A by PCR: NEGATIVE
Influenza B by PCR: NEGATIVE
Resp Syncytial Virus by PCR: NEGATIVE
SARS Coronavirus 2 by RT PCR: POSITIVE — AB

## 2022-07-02 MED ORDER — NIRMATRELVIR/RITONAVIR (PAXLOVID)TABLET: 3.0000 | ORAL_TABLET | Freq: Two times a day (BID) | ORAL | 0 refills | Status: AC

## 2022-07-02 NOTE — ED Triage Notes (Signed)
Pt states she got sick last night with body aches- pt tested positive for covid this morning

## 2022-07-02 NOTE — ED Provider Notes (Signed)
[facility] Provider Note    Event Date/Time   First MD Initiated Contact with Patient 07/02/22 1011     (approximate)   History   Covid Positive   HPI  [name] is a 66 y.o. female who presents with 1 day of bodyaches, fatigue.  She did a home COVID test last night which was positive.  No shortness of breath, intermittent mild cough.     Physical Exam   Triage Vital Signs: ED Triage Vitals  Enc Vitals Group     BP 07/02/22 0944 130/70     Pulse Rate 07/02/22 0944 99     Resp 07/02/22 0944 18     Temp 07/02/22 0944 100.2 F (37.9 C)     Temp src --      SpO2 07/02/22 0944 95 %     Weight 07/02/22 0944 79.4 kg (175 lb)     Height 07/02/22 0944 1.651 m ('5\' 5"'$ )     Head Circumference --      Peak Flow --      Pain Score 07/02/22 0950 10     Pain Loc --      Pain Edu? --      Excl. in [place]? --     Most recent vital signs: Vitals:   07/02/22 0944  BP: 130/70  Pulse: 99  Resp: 18  Temp: 100.2 F (37.9 C)  SpO2: 95%     General: Awake, no distress.  CV:  Good peripheral perfusion.  Resp:  Normal effort.  Abd:  No distention.  Other:     ED Results / Procedures / Treatments   Labs (all labs ordered are listed, but only abnormal results are displayed) Labs Reviewed  RESP PANEL BY RT-PCR (RSV, FLU A&B, COVID)  [id number] - Abnormal; Notable for the following components:      Result Value   SARS Coronavirus 2 by RT PCR POSITIVE (*)    All other components within normal limits  BASIC METABOLIC PANEL - Abnormal; Notable for the following components:   Potassium 3.4 (*)    Glucose, Bld 119 (*)    All other components within normal limits  CBC WITH DIFFERENTIAL/PLATELET     EKG     RADIOLOGY X-ray viewed interpreted by me, no pneumonia    PROCEDURES:  Critical Care performed:   Procedures   MEDICATIONS ORDERED IN ED: Medications - No data to display   IMPRESSION / MDM / [place] / ED COURSE  I  reviewed the triage vital signs and the nursing notes. Patient's presentation is most consistent with acute illness / injury with system symptoms.   Patient presents with viral symptoms with positive COVID test at home.  This is likely the cause of her symptoms.  Lab work evaluated and is reassuring here, chest x-ray without evidence of pneumonia.  Oxygen saturation is normal.  Symptom onset within 72 hours, will prescribe Paxlovid, GFR is normal, outpatient follow-up recommended, no indication for admission.       FINAL CLINICAL IMPRESSION(S) / ED DIAGNOSES   Final diagnoses:  COVID-19     Rx / DC Orders   ED Discharge Orders          Ordered    nirmatrelvir/ritonavir EUA (PAXLOVID) 20 x 150 MG & 10 x '100MG'$  TABS  2 times daily        07/02/22 1037             Note:  This  document was prepared using Systems analyst and may include unintentional dictation errors.   Lavonia Drafts, MD 07/02/22 1046

## 2022-07-02 NOTE — ED Provider Triage Note (Signed)
Emergency Medicine Provider Triage Evaluation Note  [name] , a 66 y.o. female  was evaluated in triage.  Pt complains of headache, vomiting, body aches since yesterday. Took covid test today. No CP, sometimes SOB. Got 2 covid shots.  Review of Systems  Positive: N/v, aches, fever, cough Negative: cp  Physical Exam  There were no vitals taken for this visit. Gen:   Awake, no distress   Resp:  Normal effort, able to speak easily in complete sentences MSK:   Moves extremities without difficulty  Other:    Medical Decision Making  Medically screening exam initiated at 9:43 AM.  Appropriate orders placed.  [name] was informed that the remainder of the evaluation will be completed by another provider, this initial triage assessment does not replace that evaluation, and the importance of remaining in the ED until their evaluation is complete.     Marquette Old, PA-C 07/02/22 [phone]

## 2022-07-05 ENCOUNTER — Ambulatory Visit: Payer: Self-pay

## 2022-07-05 NOTE — Telephone Encounter (Signed)
Message from [place] sent at 07/05/2022  9:53 AM EST  Summary: nausea   Patient called in with nausea, asking for medicine to be sent to [place], [place] [facility] Phone: [phone] Fax: [phone]         Chief Complaint: nausea Symptoms: moderate Frequency: when Paxlovid was started Pertinent Negatives: Patient denies abdominal pain Disposition: '[]'$ ED /'[]'$ Urgent Care (no appt availability in office) / '[]'$ Appointment(In office/virtual)/ '[]'$  [place] [facility]/ '[]'$ Home Care/ '[]'$ Refused Recommended Disposition /'[]'$ [place] Mobile [facility]/ '[x]'$  Follow-up with PCP Additional Notes: pt asking for prescription for nausea. Please send to [place]  Reason for Disposition  Taking prescription medication that could cause nausea (e.g., narcotics/opiates, antibiotics, OCPs, many others)  Answer Assessment - Initial Assessment Questions 1. NAUSEA SEVERITY: "How bad is the nausea?" (e.g., mild, moderate, severe; dehydration, weight loss)   - MILD: loss of appetite without change in eating habits   - MODERATE: decreased oral intake without significant weight loss, dehydration, or malnutrition   - SEVERE: inadequate caloric or fluid intake, significant weight loss, symptoms of dehydration     moderate 2. ONSET: "When did the nausea begin?" Sat 3. VOMITING: "Any vomiting?" If Yes, ask: "How many times today?"     no 4. RECURRENT SYMPTOM: "Have you had nausea before?" If Yes, ask: "When was the last time?" "What happened that time?"     N/a 5. CAUSE: "What do you think is causing the nausea?"     paxlovid 6. PREGNANCY: "Is there any chance you are pregnant?" (e.g., unprotected intercourse, missed birth control pill, broken condom)     N/a  Protocols used: Nausea-A-AH

## 2022-07-08 NOTE — Telephone Encounter (Signed)
Able to reach pt, message from [place] read, pt verbalizes understanding. States the nausea is resolving "Somewhat" and  "Just need to get my strength up."

## 2022-07-08 NOTE — Telephone Encounter (Signed)
NA. LMTCB

## 2022-07-14 ENCOUNTER — Other Ambulatory Visit: Payer: Self-pay | Admitting: Family Medicine

## 2022-07-27 ENCOUNTER — Ambulatory Visit
Admission: RE | Admit: 2022-07-27 | Discharge: 2022-07-27 | Disposition: A | Discharge status: Home / Self Care | Payer: Medicare Other | Source: Ambulatory Visit | Attending: Family Medicine | Admitting: Family Medicine

## 2022-07-27 DIAGNOSIS — E559 Vitamin D deficiency, unspecified: Secondary | ICD-10-CM | POA: Insufficient documentation

## 2022-07-27 DIAGNOSIS — Z13820 Encounter for screening for osteoporosis: Secondary | ICD-10-CM | POA: Diagnosis not present

## 2022-07-27 DIAGNOSIS — R7303 Prediabetes: Secondary | ICD-10-CM | POA: Diagnosis not present

## 2022-07-27 DIAGNOSIS — M8589 Other specified disorders of bone density and structure, multiple sites: Secondary | ICD-10-CM | POA: Insufficient documentation

## 2022-07-27 DIAGNOSIS — Z780 Asymptomatic menopausal state: Secondary | ICD-10-CM | POA: Diagnosis not present

## 2022-07-27 NOTE — Progress Notes (Signed)
Osteopenia, decreased bone strength noted. Resulting T-score is -2.4 and osteoporosis is defined as T-score of -2.5. Strongly recommend use Vit D 800 IU and Calcium 1200 mg to assist regular exercise for bone health. Can repeat DEXA in 3 years if desired.

## 2022-07-28 ENCOUNTER — Ambulatory Visit (INDEPENDENT_AMBULATORY_CARE_PROVIDER_SITE_OTHER): Payer: Medicare Other | Admitting: Family Medicine

## 2022-07-28 ENCOUNTER — Encounter: Payer: Self-pay | Admitting: Family Medicine

## 2022-07-28 VITALS — BP 131/69 | HR 65 | Temp 98.4°F | Resp 16 | Ht 65.0 in | Wt 172.0 lb

## 2022-07-28 DIAGNOSIS — Z8616 Personal history of COVID-19: Secondary | ICD-10-CM | POA: Insufficient documentation

## 2022-07-28 DIAGNOSIS — G8929 Other chronic pain: Secondary | ICD-10-CM | POA: Diagnosis not present

## 2022-07-28 DIAGNOSIS — M8589 Other specified disorders of bone density and structure, multiple sites: Secondary | ICD-10-CM

## 2022-07-28 DIAGNOSIS — E876 Hypokalemia: Secondary | ICD-10-CM | POA: Insufficient documentation

## 2022-07-28 DIAGNOSIS — R7989 Other specified abnormal findings of blood chemistry: Secondary | ICD-10-CM | POA: Diagnosis not present

## 2022-07-28 DIAGNOSIS — M546 Pain in thoracic spine: Secondary | ICD-10-CM

## 2022-07-28 MED ORDER — CYCLOBENZAPRINE HCL 10 MG PO TABS: 5.0000 mg | ORAL_TABLET | Freq: Every day | ORAL | 0 refills | Status: DC

## 2022-07-28 NOTE — Assessment & Plan Note (Signed)
Chronic, stable without symptoms Repeat labs today

## 2022-07-28 NOTE — Progress Notes (Signed)
I,Joseline E Rosas,acting as a scribe for Gwyneth Sprout, FNP.,have documented all relevant documentation on the behalf of Gwyneth Sprout, FNP,as directed by  Gwyneth Sprout, FNP while in the presence of Gwyneth Sprout, FNP.   Established patient visit  Patient: [name]   DOB: 20-Dec-1955   67 y.o. Female  MRN: [id number] Visit Date: 07/28/2022  Today's healthcare provider: Gwyneth Sprout, FNP re Introduced to nurse practitioner role and practice setting.  All questions answered.  Discussed provider/patient relationship and expectations.  Chief Complaint  Patient presents with   Follow-up   Subjective    HPI  Follow up ER visit  Patient was seen in ER for COVID Positive on 07/02/2022. She was treated for Paxlovid.  She reports excellent compliance with treatment. She reports this condition is Improved.  Thyroid labs abnormal. Patient not on thyroid medicine. Lab Results  Component Value Date   TSH 0.417 (L) 05/19/2022    Medications: Outpatient Medications Prior to Visit  Medication Sig   albuterol (VENTOLIN HFA) 108 (90 Base) MCG/ACT inhaler INHALE 2 PUFFS INTO THE LUNGS EVERY 6 HOURS AS NEEDED FOR WHEEZING OR SHORTNESS OF BREATH   amLODipine (NORVASC) 10 MG tablet TAKE 1 TABLET(10 MG) BY MOUTH DAILY   atorvastatin (LIPITOR) 40 MG tablet TAKE 1 TABLET BY MOUTH DAILY   clopidogrel (PLAVIX) 75 MG tablet TAKE 1 TABLET(75 MG) BY MOUTH DAILY   fluticasone (FLONASE) 50 MCG/ACT nasal spray Place 2 sprays into both nostrils daily.   Omega-3 Fatty Acids (FISH OIL) 1200 MG CAPS Take 1 capsule by mouth daily.   pantoprazole (PROTONIX) 40 MG tablet Take 1 tablet (40 mg total) by mouth daily. MUST SCHEDULE OFFICE VISIT   potassium chloride (KLOR-CON) 10 MEQ tablet Take 10 mEq by mouth daily. M-F only   VITAMIN D PO Take 5,000 Units by mouth daily.   [DISCONTINUED] cyclobenzaprine (FLEXERIL) 10 MG tablet TAKE 1 TABLET(10 MG) BY MOUTH AT BEDTIME (Patient not taking: Reported on  05/19/2022)   No facility-administered medications prior to visit.    Review of Systems   Objective    BP 131/69 (BP Location: Right Arm, Patient Position: Sitting, Cuff Size: Normal)   Pulse 65   Temp 98.4 F (36.9 C) (Oral)   Resp 16   Ht '5\' 5"'$  (1.651 m)   Wt 172 lb (78 kg)   BMI 28.62 kg/m   Physical Exam Vitals and nursing note reviewed.  Constitutional:      General: She is not in acute distress.    Appearance: Normal appearance. She is overweight. She is not ill-appearing, toxic-appearing or diaphoretic.  HENT:     Head: Normocephalic and atraumatic.     Right Ear: Tympanic membrane, ear canal and external ear normal.     Left Ear: Tympanic membrane, ear canal and external ear normal.     Nose: Congestion and rhinorrhea present.     Comments: Continue to recommend OTC supportive therapies to assist post COVID nasal symptoms, ex flonase    Mouth/Throat:     Mouth: Mucous membranes are moist.     Pharynx: Oropharynx is clear. No posterior oropharyngeal erythema.  Eyes:     Extraocular Movements: Extraocular movements intact.     Conjunctiva/sclera: Conjunctivae normal.     Pupils: Pupils are equal, round, and reactive to light.  Cardiovascular:     Rate and Rhythm: Normal rate and regular rhythm.     Pulses: Normal pulses.     Heart  sounds: Normal heart sounds. No murmur heard.    No friction rub. No gallop.  Pulmonary:     Effort: Pulmonary effort is normal. No respiratory distress.     Breath sounds: Normal breath sounds. No stridor. No wheezing, rhonchi or rales.  Chest:     Chest wall: No tenderness.  Musculoskeletal:        General: No swelling, tenderness, deformity or signs of injury. Normal range of motion.     Right lower leg: No edema.     Left lower leg: No edema.  Skin:    General: Skin is warm and dry.     Capillary Refill: Capillary refill takes less than 2 seconds.     Coloration: Skin is not jaundiced or pale.     Findings: No bruising,  erythema, lesion or rash.  Neurological:     General: No focal deficit present.     Mental Status: She is alert and oriented to person, place, and time. Mental status is at baseline.     Cranial Nerves: No cranial nerve deficit.     Sensory: No sensory deficit.     Motor: No weakness.     Coordination: Coordination normal.  Psychiatric:        Mood and Affect: Mood normal.        Behavior: Behavior normal.        Thought Content: Thought content normal.        Judgment: Judgment normal.    No results found for any visits on 07/28/22.  Assessment & Plan     Problem List Items Addressed This Visit       Musculoskeletal and Integument   Osteopenia of multiple sites    Chronic, T score -2.4 Reviewed results and closeness to Osteoporosis  Pt notes she has not been as active; and plans to reinstate her gym membership and starting walking routine now that she is over COVID Had Vit D supplement bottle; confirmed adequate dose        Other   Chronic left-sided thoracic back pain    Chronic, exacerbated by coughing s/p COVID Additional flexeril called in to assist       Relevant Medications   cyclobenzaprine (FLEXERIL) 10 MG tablet   History of COVID-19 - Primary    Acute, improved; not 100% Fullness in both ears; recommend use of flonase to assist Normal exam otherwise      Hypokalemia    Acute on chronic, on supplementation Repeat labs      Relevant Orders   Basic Metabolic Panel (BMET)   Low TSH level    Chronic, stable without symptoms Repeat labs today      Relevant Orders   TSH   Return in about 3 months (around 10/27/2022) for chonic disease management, HTN management, T2DM management.     Vonna Kotyk, FNP, have reviewed all documentation for this visit. The documentation on 07/28/22 for the exam, diagnosis, procedures, and orders are all accurate and complete.  Gwyneth Sprout, [place] [phone] (phone) [phone]  (fax)  [place]

## 2022-07-28 NOTE — Assessment & Plan Note (Signed)
Chronic, T score -2.4 Reviewed results and closeness to Osteoporosis  Pt notes she has not been as active; and plans to reinstate her gym membership and starting walking routine now that she is over COVID Had Vit D supplement bottle; confirmed adequate dose

## 2022-07-28 NOTE — Assessment & Plan Note (Signed)
Acute, improved; not 100% Fullness in both ears; recommend use of flonase to assist Normal exam otherwise

## 2022-07-28 NOTE — Assessment & Plan Note (Signed)
Chronic, exacerbated by coughing s/p COVID Additional flexeril called in to assist

## 2022-07-28 NOTE — Assessment & Plan Note (Signed)
Acute on chronic, on supplementation Repeat labs

## 2022-07-29 LAB — BASIC METABOLIC PANEL
BUN/Creatinine Ratio: 10 — ABNORMAL LOW (ref 12–28)
BUN: 6 mg/dL — ABNORMAL LOW (ref 8–27)
CO2: 24 mmol/L (ref 20–29)
Calcium: 9.3 mg/dL (ref 8.7–10.3)
Chloride: 102 mmol/L (ref 96–106)
Creatinine, Ser: 0.6 mg/dL (ref 0.57–1.00)
Glucose: 88 mg/dL (ref 70–99)
Potassium: 4 mmol/L (ref 3.5–5.2)
Sodium: 141 mmol/L (ref 134–144)
eGFR: 99 mL/min/{1.73_m2} (ref >59)

## 2022-07-29 LAB — TSH: TSH: 0.702 u[IU]/mL (ref 0.450–4.500)

## 2022-07-29 NOTE — Progress Notes (Signed)
Labs have been reviewed and are normal and/or stable.  Please let us know if you have any questions.  Thank you, Gwyneth Sprout, [address] [place], [place] [address] [phone] (phone) [phone] (fax) [place]

## 2022-08-02 ENCOUNTER — Emergency Department: Payer: Medicare Other

## 2022-08-02 ENCOUNTER — Emergency Department
Admission: EM | Admit: 2022-08-02 | Discharge: 2022-08-02 | Disposition: A | Discharge status: Home / Self Care | Payer: Medicare Other | Attending: Emergency Medicine | Admitting: Emergency Medicine

## 2022-08-02 ENCOUNTER — Ambulatory Visit: Payer: Self-pay

## 2022-08-02 ENCOUNTER — Other Ambulatory Visit: Payer: Self-pay

## 2022-08-02 DIAGNOSIS — R109 Unspecified abdominal pain: Secondary | ICD-10-CM

## 2022-08-02 DIAGNOSIS — I10 Essential (primary) hypertension: Secondary | ICD-10-CM | POA: Insufficient documentation

## 2022-08-02 DIAGNOSIS — K7689 Other specified diseases of liver: Secondary | ICD-10-CM | POA: Diagnosis not present

## 2022-08-02 DIAGNOSIS — R1011 Right upper quadrant pain: Secondary | ICD-10-CM | POA: Diagnosis not present

## 2022-08-02 DIAGNOSIS — R079 Chest pain, unspecified: Secondary | ICD-10-CM | POA: Diagnosis not present

## 2022-08-02 LAB — CBC
HCT: 40.4 % (ref 36.0–46.0)
Hemoglobin: 13.2 g/dL (ref 12.0–15.0)
MCH: 28 pg (ref 26.0–34.0)
MCHC: 32.7 g/dL (ref 30.0–36.0)
MCV: 85.8 fL (ref 80.0–100.0)
Platelets: 356 10*3/uL (ref 150–400)
RBC: 4.71 MIL/uL (ref 3.87–5.11)
RDW: 13 % (ref 11.5–15.5)
WBC: 8.6 10*3/uL (ref 4.0–10.5)
nRBC: 0 % (ref 0.0–0.2)

## 2022-08-02 LAB — COMPREHENSIVE METABOLIC PANEL
ALT: 8 U/L (ref 0–44)
AST: 17 U/L (ref 15–41)
Albumin: 4 g/dL (ref 3.5–5.0)
Alkaline Phosphatase: 96 U/L (ref 38–126)
Anion gap: 9 (ref 5–15)
BUN: 9 mg/dL (ref 8–23)
CO2: 29 mmol/L (ref 22–32)
Calcium: 9.4 mg/dL (ref 8.9–10.3)
Chloride: 100 mmol/L (ref 98–111)
Creatinine, Ser: 0.62 mg/dL (ref 0.44–1.00)
GFR, Estimated: >60 mL/min (ref >60)
Glucose, Bld: 90 mg/dL (ref 70–99)
Potassium: 3.6 mmol/L (ref 3.5–5.1)
Sodium: 138 mmol/L (ref 135–145)
Total Bilirubin: 0.5 mg/dL (ref 0.3–1.2)
Total Protein: 7.8 g/dL (ref 6.5–8.1)

## 2022-08-02 LAB — URINALYSIS, ROUTINE W REFLEX MICROSCOPIC
Bacteria, UA: NONE SEEN
Bilirubin Urine: NEGATIVE
Glucose, UA: NEGATIVE mg/dL
Hgb urine dipstick: NEGATIVE
Ketones, ur: NEGATIVE mg/dL
Leukocytes,Ua: NEGATIVE
Nitrite: NEGATIVE
Protein, ur: 30 mg/dL — AB
Specific Gravity, Urine: 1.023 (ref 1.005–1.030)
pH: 5 (ref 5.0–8.0)

## 2022-08-02 LAB — TROPONIN I (HIGH SENSITIVITY): Troponin I (High Sensitivity): 3 ng/L (ref <18)

## 2022-08-02 LAB — LIPASE, BLOOD: Lipase: 33 U/L (ref 11–51)

## 2022-08-02 MED ORDER — IOHEXOL 300 MG/ML  SOLN
100.0000 mL | Freq: Once | INTRAMUSCULAR | Status: AC | PRN
  Administered 2022-08-02: 100 mL via INTRAVENOUS

## 2022-08-02 NOTE — ED Provider Notes (Signed)
Care assumed of patient from outgoing provider.  See their note for initial history, exam and plan.  Clinical Course as of 08/02/22 1515  Tue Aug 02, 2022  1515 Patient presents to the emergency department with 4 days of right upper quadrant abdominal pain.  Lab work reassuring.  CT abdomen and pelvis obtained and waiting for read.  Plan to follow-up on CT scan [SM]    Clinical Course User Index [SM] Nathaniel Man, MD     CT scan with hepatic cyst.  No signs of an infectious process.  Patient states that she will follow-up with her gastroenterologist and her primary care physician about these findings.  Given return precautions.   Nathaniel Man, MD 08/02/22 1728

## 2022-08-02 NOTE — Discharge Instructions (Signed)
You were seen in the emergency department and had a CT scan done that showed cyst on your liver.  Follow-up with your primary care physician and your GI doctor for further workup for the cyst on your liver.  Return to the emergency department if your symptoms worsen.

## 2022-08-02 NOTE — ED Provider Notes (Signed)
Care assumed of patient from outgoing provider.  See their note for initial history, exam and plan.  Clinical Course as of 08/02/22 1634  Tue Aug 02, 2022  1515 Patient presents to the emergency department with 4 days of right upper quadrant abdominal pain.  Lab work reassuring.  CT abdomen and pelvis obtained and waiting for read.  Plan to follow-up on CT scan [SM]    Clinical Course User Index [SM] Nathaniel Man, MD   CT scan with hepatic cyst.  Patient's pain has resolved.  Patient is followed with gastroenterology and a primary care provider.  States that she will follow-up with her GI doctor and her primary care physician for further workup for her past hepatic cyst.   Nathaniel Man, MD 08/02/22 1635

## 2022-08-02 NOTE — Telephone Encounter (Signed)
  Chief Complaint: right side of chest Symptoms: severe pain with deep breath, constant pain,  Frequency: Since Sat Pertinent Negatives: Patient denies dizziness, nausea, vomiting, sweating, fever, difficulty breathing, cough Disposition: '[x]'$ ED /'[]'$ Urgent Care (no appt availability in office) / '[]'$ Appointment(In office/virtual)/ '[]'$  [place] Virtual [facility]/ '[]'$ Home Care/ '[]'$ Refused Recommended Disposition /'[]'$ [place] [facility]/ '[]'$  Follow-up with PCP Additional Notes: to ED Reason for Disposition  Taking a deep breath makes pain worse  Answer Assessment - Initial Assessment Questions 1. LOCATION: "Where does it hurt?"       Right side of chest  2. RADIATION: "Does the pain go anywhere else?" (e.g., into neck, jaw, arms, back)     no 3. ONSET: "When did the chest pain begin?" (Minutes, hours or days)      Saturday  4. PATTERN: "Does the pain come and go, or has it been constant since it started?"  "Does it get worse with exertion?"      constant 5. DURATION: "How long does it last" (e.g., seconds, minutes, hours)     N/a 6. SEVERITY: "How bad is the pain?"  (e.g., Scale 1-10; mild, moderate, or severe)    - MILD (1-3): doesn't interfere with normal activities     - MODERATE (4-7): interferes with normal activities or awakens from sleep    - SEVERE (8-10): excruciating pain, unable to do any normal activities       Severe- sharp pain with deep breathing 7. CARDIAC RISK FACTORS: "Do you have any history of heart problems or risk factors for heart disease?" (e.g., angina, prior heart attack; diabetes, high blood pressure, high cholesterol, smoker, or strong family history of heart disease)     HTN 8. PULMONARY RISK FACTORS: "Do you have any history of lung disease?"  (e.g., blood clots in lung, asthma, emphysema, birth control pills)     no 9. CAUSE: "What do you think is causing the chest pain?"     unsure 10. OTHER SYMPTOMS: "Do you have any other symptoms?" (e.g., dizziness, nausea,  vomiting, sweating, fever, difficulty breathing, cough)       Hurts with deep breath, dry cough 11. PREGNANCY: "Is there any chance you are pregnant?" "When was your last menstrual period?"       N/a  Protocols used: Chest Pain-A-AH

## 2022-08-02 NOTE — ED Provider Notes (Addendum)
[facility] Provider Note    Event Date/Time   First MD Initiated Contact with Patient 08/02/22 1317     (approximate)   History   Abdominal Pain   HPI  [name] is a 67 y.o. female   Past medical history of GERD, hypertension, hyperlipidemia who presents to the emergency department with right-sided abdominal pain for the last 4 days.  Right upper quadrant/flank pain.  No urinary symptoms.  No nausea or vomiting or fever or chills.  Worse with deep breaths or twisting turning movements.  No inciting event like trauma.      No other acute medical complaints.  History was obtained via the patient. Reviewed external medical notes including a emergency department visit dated July 02, 2022 when she was diagnosed with COVID from positive home test and received Paxlovid prescription.     Physical Exam   Triage Vital Signs: ED Triage Vitals [08/02/22 1143]  Enc Vitals Group     BP (!) 143/73     Pulse Rate 66     Resp 17     Temp 98.3 F (36.8 C)     Temp Source Oral     SpO2 97 %     Weight      Height      Head Circumference      Peak Flow      Pain Score 10     Pain Loc      Pain Edu?      Excl. in [place]?     Most recent vital signs: Vitals:   08/02/22 1143 08/02/22 1322  BP: (!) 143/73 139/66  Pulse: 66 64  Resp: 17 18  Temp: 98.3 F (36.8 C) 98.3 F (36.8 C)  SpO2: 97% 98%    General: Awake, no distress.  CV:  Good peripheral perfusion.  Resp:  Normal effort.  Abd:  No distention.  Other:  Oriented pleasant comfortable appearing nontoxic, normal hemodynamics without fever.  Tenderness to palpation in the right flank, worse with movement as well.  No overlying signs of infection or trauma.  Remainder of abdominal exam soft and nontender no rigidity.   ED Results / Procedures / Treatments   Labs (all labs ordered are listed, but only abnormal results are displayed) Labs Reviewed  URINALYSIS, ROUTINE W REFLEX  MICROSCOPIC - Abnormal; Notable for the following components:      Result Value   Color, Urine AMBER (*)    APPearance CLOUDY (*)    Protein, ur 30 (*)    All other components within normal limits  LIPASE, BLOOD  COMPREHENSIVE METABOLIC PANEL  CBC  TROPONIN I (HIGH SENSITIVITY)     I reviewed labs and they are notable for urinalysis without bacteria or blood  EKG  ED ECG REPORT I, Lucillie Garfinkel, the attending physician, personally viewed and interpreted this ECG.   Date: 08/02/2022  EKG Time: 1452  Rate: 62  Rhythm: normal sinus rhythm  Axis: nl  Intervals:none  ST&T Change: No acute ischemic changes    RADIOLOGY I independently reviewed and interpreted chest x-ray and see no obvious focalities or pneumothorax   PROCEDURES:  Critical Care performed: No  Procedures   MEDICATIONS ORDERED IN ED: Medications  iohexol (OMNIPAQUE) 300 MG/ML solution 100 mL (100 mLs Intravenous Contrast Given 08/02/22 1415)    Consultants:  IMPRESSION / MDM / [place] / ED COURSE  I reviewed the triage vital signs and the nursing notes.  Differential diagnosis includes, but is not limited to, musculoskeletal strain, pyelonephritis or urinary tract infection, cholecystitis or biliary pathology, kidney stone, intra-abdominal infection like appendicitis, ACS, PE, pneumonia   MDM: This is pain in the right upper quadrant/flank pleuritic in nature and worse with certain movements, likely musculoskeletal with some tenderness to palpation along that region.  Assess for urinary pathology with a UA does not appear infected and no red blood cells.  Check intra-abdominal pathologies with a CT scan of the abdomen pelvis rule out obstruction infection or surgical pathologies.  Since this is in her upper abdomen pleuritic in nature would like to obtain EKG and troponins as well as a chest x-ray rule out pneumonia, myocarditis, ACS especially in the context of recent  COVID infection.  Workup thus far unremarkable initial troponin negative EKG nonischemic given 4 days of symptoms and very atypical for cardiac, defer second troponin.  Patient has been stable in the ED.  Pending CT abdomen and pelvis if negative for acute intra-abdominal pathologies, can home with conservative pain treatment for the most likely musculoskeletal strain.   Patient's presentation is most consistent with acute presentation with potential threat to life or bodily function.       FINAL CLINICAL IMPRESSION(S) / ED DIAGNOSES   Final diagnoses:  Right flank pain     Rx / DC Orders   ED Discharge Orders     None        Note:  This document was prepared using Dragon voice recognition software and may include unintentional dictation errors.    Lucillie Garfinkel, MD 08/02/22 1455    Lucillie Garfinkel, MD 08/02/22 [phone]

## 2022-08-02 NOTE — ED Provider Triage Note (Signed)
Emergency Medicine Provider Triage Evaluation Note  [name], a 67 y.o. female  was evaluated in triage.  Pt complains of right-sided abdominal pain that is been present for 4 days patient denies any associated nausea, vomiting, diarrhea patient does endorse pain is worse with deep breaths.   Review of Systems  Positive: Right abd pain Negative: FCS  Physical Exam  BP (!) 143/73 (BP Location: Right Arm)   Pulse 66   Temp 98.3 F (36.8 C) (Oral)   Resp 17   SpO2 97%  Gen:   Awake, no distress  NAD Resp:  Normal effort CTA MSK:   Moves extremities without difficulty  Other:    Medical Decision Making  Medically screening exam initiated at 11:54 AM.  Appropriate orders placed.  [name] was informed that the remainder of the evaluation will be completed by another provider, this initial triage assessment does not replace that evaluation, and the importance of remaining in the ED until their evaluation is complete.  Patient to the ED for evaluation of left-sided abdominal and flank pain.  Patient denies any associated NVD, or FCS.   Melvenia Needles, PA-C 08/02/22 1155

## 2022-08-02 NOTE — ED Triage Notes (Signed)
Pt presents to ED with /co of R sided pain that has been ongoing for 4 days. Pt denies N/V/D. Pt denies fevers or chills.

## 2022-08-03 ENCOUNTER — Other Ambulatory Visit: Payer: Self-pay | Admitting: Family Medicine

## 2022-08-03 ENCOUNTER — Telehealth: Payer: Self-pay

## 2022-08-03 DIAGNOSIS — K7689 Other specified diseases of liver: Secondary | ICD-10-CM | POA: Insufficient documentation

## 2022-08-03 NOTE — Telephone Encounter (Signed)
Copied from [place] [id number]. Topic: General - Other >> Aug 03, 2022 10:48 AM Sabas Sous wrote: Reason for CRM: Pt says she was instructed to contact her PCP and discuss her recent imaging results, please advise   Best contact: >> Aug 03, 2022 10:50 AM Sabas Sous wrote: Best contact: [phone]

## 2022-08-05 ENCOUNTER — Telehealth: Payer: Self-pay

## 2022-08-05 NOTE — Telephone Encounter (Signed)
Pt was seen ED and CT showed hepatic cysts--- Pt wanted to know if she needs to schedule an OV to be seen... please advise

## 2022-08-08 ENCOUNTER — Telehealth: Payer: Self-pay

## 2022-08-08 NOTE — Patient Outreach (Signed)
  Care Coordination TOC Note Transition Care Management Follow-up Telephone Call Date of discharge and from where: [facility] 08/03/22 How have you been since you were released from the hospital? "I am feeling much better" Any questions or concerns? No  Items Reviewed: Did the pt receive and understand the discharge instructions provided? Yes  Medications obtained and verified? Yes  Other? No  Any new allergies since your discharge? No  Dietary orders reviewed? No Do you have support at home? Yes   Home Care and Equipment/Supplies: Were home health services ordered? no If so, what is the name of the agency? N/a  Has the agency set up a time to come to the patient's home? not applicable Were any new equipment or medical supplies ordered?  No What is the name of the medical supply agency? N/A Were you able to get the supplies/equipment? not applicable Do you have any questions related to the use of the equipment or supplies? No  Functional Questionnaire: (I = Independent and D = Dependent) ADLs: I  Bathing/Dressing- I  Meal Prep- I  Eating- I  Maintaining continence- I  Transferring/Ambulation- I  Managing Meds- I  Follow up appointments reviewed:  PCP Hospital f/u appt confirmed? Yes  Scheduled to see Tally Joe, NP on 08/12/22 @ 3:20. [facility] f/u appt confirmed? No   Are transportation arrangements needed? No  If their condition worsens, is the pt aware to call PCP or go to the Emergency Dept.? Yes Was the patient provided with contact information for the PCP's office or ED? Yes Was to pt encouraged to call back with questions or concerns? Yes  SDOH assessments and interventions completed:   Yes SDOH Interventions Today    Flowsheet Row Most Recent Value  SDOH Interventions   Food Insecurity Interventions Intervention Not Indicated  Housing Interventions Intervention Not Indicated       Care Coordination Interventions:  No Care Coordination  interventions needed at this time.   Encounter Outcome:  Pt. Visit Completed

## 2022-08-09 NOTE — Telephone Encounter (Signed)
Left message on voicemail

## 2022-08-10 NOTE — Telephone Encounter (Signed)
Left message on voicemail.

## 2022-08-10 NOTE — Telephone Encounter (Signed)
Pt returning your call from yesterday. Please call pt back at [phone].

## 2022-08-11 NOTE — Telephone Encounter (Signed)
Spoke to pt and she is aware as instructed and expressed understanding

## 2022-08-12 ENCOUNTER — Ambulatory Visit: Payer: 59 | Admitting: Family Medicine

## 2022-08-15 ENCOUNTER — Ambulatory Visit (INDEPENDENT_AMBULATORY_CARE_PROVIDER_SITE_OTHER): Payer: 59 | Admitting: Family Medicine

## 2022-08-15 ENCOUNTER — Encounter: Payer: Self-pay | Admitting: Family Medicine

## 2022-08-15 VITALS — BP 133/73 | HR 83 | Temp 98.1°F | Wt 173.8 lb

## 2022-08-15 DIAGNOSIS — Z09 Encounter for follow-up examination after completed treatment for conditions other than malignant neoplasm: Secondary | ICD-10-CM | POA: Diagnosis not present

## 2022-08-15 DIAGNOSIS — K7689 Other specified diseases of liver: Secondary | ICD-10-CM | POA: Diagnosis not present

## 2022-08-15 DIAGNOSIS — K2100 Gastro-esophageal reflux disease with esophagitis, without bleeding: Secondary | ICD-10-CM | POA: Diagnosis not present

## 2022-08-15 MED ORDER — PANTOPRAZOLE SODIUM 40 MG PO TBEC: 40.0000 mg | DELAYED_RELEASE_TABLET | Freq: Every day | ORAL | 3 refills | Status: DC

## 2022-08-15 NOTE — Progress Notes (Signed)
I,Connie R Striblin,acting as a Education administrator for Gwyneth Sprout, FNP.,have documented all relevant documentation on the behalf of Gwyneth Sprout, FNP,as directed by  Gwyneth Sprout, FNP while in the presence of Gwyneth Sprout, FNP.  Established patient visit  Patient: [name]   DOB: [birth year]   67 y.o. Female  MRN: [id number] Visit Date: 08/15/2022  Today's healthcare provider: Gwyneth Sprout, FNP  Re Continue to recommend balanced, lower carb meals. Smaller meal size, adding snacks. Choosing water as drink of choice and increasing purposeful exercise.  Subjective    HPI  Follow up ER visit  Patient was admitted to [facility] on 1/09 and discharged on 1/09. She was treated for R flank pain/ hepatic cyst  Treatment for this included none Telephone follow up was done on 08/08/22 She reports good compliance with treatment. She reports this condition is resolved.  ---------------------------------------------------------------------------------------- Medications: Outpatient Medications Prior to Visit  Medication Sig   albuterol (VENTOLIN HFA) 108 (90 Base) MCG/ACT inhaler INHALE 2 PUFFS INTO THE LUNGS EVERY 6 HOURS AS NEEDED FOR WHEEZING OR SHORTNESS OF BREATH   amLODipine (NORVASC) 10 MG tablet TAKE 1 TABLET(10 MG) BY MOUTH DAILY   atorvastatin (LIPITOR) 40 MG tablet TAKE 1 TABLET BY MOUTH DAILY   clopidogrel (PLAVIX) 75 MG tablet TAKE 1 TABLET(75 MG) BY MOUTH DAILY   cyclobenzaprine (FLEXERIL) 10 MG tablet Take 0.5 tablets (5 mg total) by mouth at bedtime.   fluticasone (FLONASE) 50 MCG/ACT nasal spray Place 2 sprays into both nostrils daily.   Omega-3 Fatty Acids (FISH OIL) 1200 MG CAPS Take 1 capsule by mouth daily.   potassium chloride (KLOR-CON) 10 MEQ tablet Take 10 mEq by mouth daily. M-F only   VITAMIN D PO Take 5,000 Units by mouth daily.   [DISCONTINUED] pantoprazole (PROTONIX) 40 MG tablet Take 1 tablet (40 mg total) by mouth daily. MUST SCHEDULE OFFICE VISIT   No  facility-administered medications prior to visit.   Review of Systems    Objective    BP 133/73 (BP Location: Left Arm, Patient Position: Sitting, Cuff Size: Normal)   Pulse 83   Temp 98.1 F (36.7 C) (Oral)   Wt 173 lb 12.8 oz (78.8 kg)   SpO2 99%   BMI 28.92 kg/m   Physical Exam Vitals and nursing note reviewed.  Constitutional:      General: She is not in acute distress.    Appearance: Normal appearance. She is overweight. She is not ill-appearing, toxic-appearing or diaphoretic.  HENT:     Head: Normocephalic and atraumatic.  Cardiovascular:     Rate and Rhythm: Normal rate and regular rhythm.     Pulses: Normal pulses.     Heart sounds: Normal heart sounds. No murmur heard.    No friction rub. No gallop.  Pulmonary:     Effort: Pulmonary effort is normal. No respiratory distress.     Breath sounds: Normal breath sounds. No stridor. No wheezing, rhonchi or rales.  Chest:     Chest wall: No tenderness.  Abdominal:     General: Bowel sounds are normal. There is no distension.     Palpations: Abdomen is soft.     Tenderness: There is no abdominal tenderness. There is no guarding.  Musculoskeletal:        General: No swelling, tenderness, deformity or signs of injury. Normal range of motion.     Right lower leg: No edema.     Left lower leg: No edema.  Skin:    General: Skin is warm and dry.     Capillary Refill: Capillary refill takes less than 2 seconds.     Coloration: Skin is not jaundiced or pale.     Findings: No bruising, erythema, lesion or rash.  Neurological:     General: No focal deficit present.     Mental Status: She is alert and oriented to person, place, and time. Mental status is at baseline.     Cranial Nerves: No cranial nerve deficit.     Sensory: No sensory deficit.     Motor: No weakness.     Coordination: Coordination normal.  Psychiatric:        Mood and Affect: Mood normal.        Behavior: Behavior normal.        Thought Content: Thought  content normal.        Judgment: Judgment normal.     No results found for any visits on 08/15/22.  Assessment & Plan     Problem List Items Addressed This Visit       Digestive   Gastroesophageal reflux disease with esophagitis without hemorrhage    Chronic, stable Continue protonix 40 mg qd Denies current concerns or exacerbations       Hepatic cyst - Primary    Chronic, stable Has follow up with GI and referral into Hepatology Denies current concerns or symptoms Wishes to continue protonix 40 mg qd to assist with reflux symptoms        Other   Hospital discharge follow-up    Seen at [facility] for flank pain; found to have hepatic cyst Denies current pain or concerns       Return if symptoms worsen or fail to improve.     Vonna Kotyk, FNP, have reviewed all documentation for this visit. The documentation on 08/15/22 for the exam, diagnosis, procedures, and orders are all accurate and complete.  Gwyneth Sprout, [place] [phone] (phone) [phone] (fax)  [place]

## 2022-08-15 NOTE — Assessment & Plan Note (Signed)
Seen at [facility] for flank pain; found to have hepatic cyst Denies current pain or concerns

## 2022-08-15 NOTE — Assessment & Plan Note (Signed)
Chronic, stable Continue protonix 40 mg qd Denies current concerns or exacerbations

## 2022-08-15 NOTE — Assessment & Plan Note (Signed)
Chronic, stable Has follow up with GI and referral into Hepatology Denies current concerns or symptoms Wishes to continue protonix 40 mg qd to assist with reflux symptoms

## 2022-08-25 ENCOUNTER — Other Ambulatory Visit: Payer: Self-pay | Admitting: Family Medicine

## 2022-08-25 DIAGNOSIS — Z8673 Personal history of transient ischemic attack (TIA), and cerebral infarction without residual deficits: Secondary | ICD-10-CM

## 2022-08-31 ENCOUNTER — Telehealth: Payer: Self-pay

## 2022-08-31 NOTE — Telephone Encounter (Signed)
Attempted to reach patient regarding FL2 forms, LMTCB. [place] for [employer] to advise

## 2022-08-31 NOTE — Telephone Encounter (Signed)
Copied from [place] [phone]. Topic: General - Inquiry >> Aug 31, 2022 10:25 AM Ludger Nutting wrote: Patient called to get the status of her FL2 form. Please follow up with patient.

## 2022-09-01 ENCOUNTER — Telehealth: Payer: Self-pay

## 2022-09-01 NOTE — Telephone Encounter (Signed)
    RN Visit Note   09/01/22 Name: SOLYMAR GRACE MRN: [id number]      DOB: 29-Apr-1956  Subjective: AMBERMARIE HONEYMAN is a 67 y.o. year old female who is a primary care patient of Gwyneth Sprout, FNP. The patient was referred to the Chronic Care Management team for assistance with care management needs subsequent to provider initiation of CCM services and plan of care.      An unsuccessful telephone outreach was attempted today to contact the patient about Care Management needs and request for FL2.  Unable to leave a voice message.  PLAN: The care management will make additional outreach attempts.   Horris Latino RN Care Manager/Chronic Care Management [phone]

## 2022-09-02 ENCOUNTER — Telehealth: Payer: Self-pay

## 2022-09-02 NOTE — Telephone Encounter (Signed)
  Care Management   Visit Note  09/02/2022 Name: [name] MRN: [id number] DOB: 1956/01/05  Subjective: [name] is a 67 y.o. year old female who is a primary care patient of Jacky Kindle, FNP. The Care Management team was consulted for assistance.      Engaged with patient via telephone.   Brief outreach with [name] regarding request for FL2. Reports she does not intend to go to a facility. Reports being informed by her assigned Peer Support person that she requires an FL2 or similar documentation to receive additional community resources.  Her assigned Peer Support was not available at the time of the call to clarify exactly what is needed.  Agreed to discuss and follow up with the care team.  PLAN  Will follow up within the next week.  [facility]/Care Management [phone]

## 2022-09-06 ENCOUNTER — Telehealth: Payer: Self-pay

## 2022-09-06 NOTE — Telephone Encounter (Signed)
    RN Visit Note   Name: [name] MRN: [id number]      DOB: 09-Nov-1955  Subjective: [name] is a 67 y.o. year old female who is a primary care patient of Jacky Kindle, FNP.  Follow up regarding request for FL2. [name] provided contact information for her Peer Support-Lauren. Attempted to reach Lauren today regarding documentation request.   PLAN: Left a voice message for Lauren/Peer Support requesting a return call.   [facility]/Care Management [phone]

## 2022-09-08 ENCOUNTER — Telehealth: Payer: Self-pay

## 2022-09-09 ENCOUNTER — Other Ambulatory Visit: Payer: Self-pay | Admitting: Family Medicine

## 2022-09-09 ENCOUNTER — Telehealth: Payer: Self-pay | Admitting: Family Medicine

## 2022-09-09 NOTE — Telephone Encounter (Signed)
FLII dropped off for completion.  They have been trying to get this completed since Nov. 2023 and she said our office keeps "losing it"    Please contact her when the form is ready for pickup.  [phone]

## 2022-09-10 ENCOUNTER — Other Ambulatory Visit: Payer: Self-pay | Admitting: Family Medicine

## 2022-09-10 DIAGNOSIS — Z8673 Personal history of transient ischemic attack (TIA), and cerebral infarction without residual deficits: Secondary | ICD-10-CM

## 2022-09-13 ENCOUNTER — Other Ambulatory Visit: Payer: Self-pay | Admitting: Family Medicine

## 2022-10-19 ENCOUNTER — Telehealth (INDEPENDENT_AMBULATORY_CARE_PROVIDER_SITE_OTHER): Payer: 59 | Admitting: Gastroenterology

## 2022-10-19 DIAGNOSIS — K7689 Other specified diseases of liver: Secondary | ICD-10-CM | POA: Diagnosis not present

## 2022-10-19 DIAGNOSIS — K219 Gastro-esophageal reflux disease without esophagitis: Secondary | ICD-10-CM | POA: Diagnosis not present

## 2022-10-19 NOTE — Progress Notes (Signed)
Lindsay Lame, MD [address]  [place], [place] [address]  Main: [phone]  Fax: [phone]    Gastroenterology Virtual/Video Visit  Referring Provider:     Gwyneth Sprout, FNP Primary Care Physician:  Gwyneth Sprout, FNP Primary Gastroenterologist:  Dr.Raymund Manrique Allen Norris Reason for Consultation:     GERD        HPI:    Virtual Visit via Video Note Location of the patient: Home Location of provider: GERD Participating persons: The patient and myself.  I connected with [name] on 10/19/22 at 10:30 AM EDT by a video enabled telemedicine application and verified that I am speaking with the correct person using two identifiers.   I discussed the limitations of evaluation and management by telemedicine and the availability of in person appointments. The patient expressed understanding and agreed to proceed.  Verbal consent to proceed obtained.  History of Present Illness: [name] is a 67 y.o. female referred by Dr. Rollene Rotunda, Jaci Standard, [place]  for consultation & management of GERD.  This patient comes in after seeing me in the past in 2021 for colonoscopy with a tubular adenoma found at that time.  The patient was then recommended to have a repeat colonoscopy in 7 years.  The patient had contacted my office in January about a CT scan showing multiple benign appearing cysts of the liver which were asymptomatic.  The patient also contacted our office back in April of last year to report that her pantoprazole was working well and at that time obtain a refill. The patient was concerned about her cyst in her liver that was reported to be 6.5 cm.  As per up-to-date online and the algorithm for a simple liver cyst if it is over 4 cm and asymptomatic no further imaging or follow-up is needed.  The patient denies any abdominal pain weight loss fevers or chills.  She states that she just needs a refill of her medication.  Past Medical History:  Diagnosis Date   Allergy     Anemia    takes Ferrous Sulfate daily   Arthritis    GERD (gastroesophageal reflux disease)    takes OTC med   Heart murmur    Hyperlipidemia    Hypertension    takes Amlodipine daily   Low back pain    occasionally    Past Surgical History:  Procedure Laterality Date   ANTERIOR CERVICAL DECOMP/DISCECTOMY FUSION  10/19/2011   Procedure: ANTERIOR CERVICAL DECOMPRESSION/DISCECTOMY FUSION 1 LEVEL/HARDWARE REMOVAL;  Surgeon: Elaina Hoops, MD;  Location: [facility];  Service: Neurosurgery;  Laterality: N/A;  Cervical four - five  Anterior cervical decompression fusion with redo at six - seven  Rm 33   CARPAL TUNNEL RELEASE     COLONOSCOPY  08/2014   polyps   COLONOSCOPY WITH PROPOFOL N/A 02/04/2020   Procedure: COLONOSCOPY WITH PROPOFOL;  Surgeon: Lindsay Lame, MD;  Location: [facility];  Service: Endoscopy;  Laterality: N/A;   ESOPHAGOGASTRODUODENOSCOPY (EGD) WITH PROPOFOL N/A 06/23/2015   Procedure: ESOPHAGOGASTRODUODENOSCOPY (EGD) WITH PROPOFOL;  Surgeon: Lindsay Lame, MD;  Location: [facility] ENDOSCOPY;  Service: Endoscopy;  Laterality: N/A;   LIPOMA EXCISION Left 03/29/2022   Procedure: EXCISION LIPOMA, left upper extremity;  Surgeon: Olean Ree, MD;  Location: [facility];  Service: General;  Laterality: Left;   NECK SURGERY     POSTERIOR CERVICAL FUSION/FORAMINOTOMY N/A 08/26/2013   Procedure: C/4-5,C/5-6,C/6-7 Posterior Cervical Fusion w/lateral mass fixation;  Surgeon: Elaina Hoops,  MD;  Location: [facility];  Service: Neurosurgery;  Laterality: N/A;   TUBAL LIGATION      Prior to Admission medications   Medication Sig Start Date End Date Taking? Authorizing Provider  albuterol (VENTOLIN HFA) 108 (90 Base) MCG/ACT inhaler INHALE 2 PUFFS INTO THE LUNGS EVERY 6 HOURS AS NEEDED FOR WHEEZING OR SHORTNESS OF BREATH 06/01/22   Tally Joe T, FNP  amLODipine (NORVASC) 10 MG tablet TAKE 1 TABLET(10 MG) BY MOUTH DAILY 05/26/22   Tally Joe T, FNP  atorvastatin (LIPITOR) 40 MG tablet TAKE  1 TABLET BY MOUTH DAILY 05/20/22   Tally Joe T, FNP  clopidogrel (PLAVIX) 75 MG tablet TAKE 1 TABLET(75 MG) BY MOUTH DAILY 02/11/22   Gwyneth Sprout, FNP  cyclobenzaprine (FLEXERIL) 10 MG tablet Take 0.5 tablets (5 mg total) by mouth at bedtime. 07/28/22   Gwyneth Sprout, FNP  fluticasone (FLONASE) 50 MCG/ACT nasal spray Place 2 sprays into both nostrils daily. 02/08/22   Gwyneth Sprout, FNP  Omega-3 Fatty Acids (FISH OIL) 1200 MG CAPS Take 1 capsule by mouth daily.    [provider]  pantoprazole (PROTONIX) 40 MG tablet Take 1 tablet (40 mg total) by mouth daily. 08/15/22   Gwyneth Sprout, FNP  potassium chloride (KLOR-CON) 10 MEQ tablet Take 10 mEq by mouth daily. M-F only    [provider]  VITAMIN D PO Take 5,000 Units by mouth daily.    [provider]    Family History  Problem Relation Age of Onset   Hypertension Mother    Heart disease Mother    Kidney failure Sister    Breast cancer Neg Hx      Social History   Tobacco Use   Smoking status: Never   Smokeless tobacco: Never  Vaping Use   Vaping Use: Never used  Substance Use Topics   Alcohol use: No   Drug use: No    Allergies as of 10/19/2022 - Review Complete 08/15/2022  Allergen Reaction Noted   Aspirin Nausea Only 10/11/2011   Hydrochlorothiazide  01/06/2015   Ibuprofen Other (See Comments) 09/04/2017   Lisinopril  01/07/2015   Sulfa antibiotics Hives and Nausea Only 10/11/2011    Review of Systems:    All systems reviewed and negative except where noted in HPI.   Observations/Objective:  Labs: CBC    Component Value Date/Time   WBC 8.6 08/02/2022 1150   RBC 4.71 08/02/2022 1150   HGB 13.2 08/02/2022 1150   HGB 13.5 05/19/2022 0934   HCT 40.4 08/02/2022 1150   HCT 42.4 05/19/2022 0934   PLT 356 08/02/2022 1150   PLT 339 05/19/2022 0934   MCV 85.8 08/02/2022 1150   MCV 86 05/19/2022 0934   MCV 84 04/19/2012 0526   MCH 28.0 08/02/2022 1150   MCHC 32.7 08/02/2022 1150    RDW 13.0 08/02/2022 1150   RDW 13.6 05/19/2022 0934   RDW 13.2 04/19/2012 0526   LYMPHSABS 0.8 07/02/2022 0945   LYMPHSABS 2.9 05/19/2022 0934   MONOABS 0.7 07/02/2022 0945   EOSABS 0.0 07/02/2022 0945   EOSABS 0.1 05/19/2022 0934   BASOSABS 0.0 07/02/2022 0945   BASOSABS 0.0 05/19/2022 0934   CMP     Component Value Date/Time   NA 138 08/02/2022 1150   NA 141 07/28/2022 0939   NA 141 04/19/2012 0526   K 3.6 08/02/2022 1150   K 3.5 04/19/2012 0526   CL 100 08/02/2022 1150   CL 103 04/19/2012 0526  CO2 29 08/02/2022 1150   CO2 28 04/19/2012 0526   GLUCOSE 90 08/02/2022 1150   GLUCOSE 98 04/19/2012 0526   BUN 9 08/02/2022 1150   BUN 6 (L) 07/28/2022 0939   BUN 7 04/19/2012 0526   CREATININE 0.62 08/02/2022 1150   CREATININE 0.71 04/19/2012 0526   CALCIUM 9.4 08/02/2022 1150   CALCIUM 8.9 04/19/2012 0526   PROT 7.8 08/02/2022 1150   PROT 7.1 05/19/2022 0934   PROT 7.4 04/19/2012 0526   ALBUMIN 4.0 08/02/2022 1150   ALBUMIN 4.3 05/19/2022 0934   ALBUMIN 3.3 (L) 04/19/2012 0526   AST 17 08/02/2022 1150   AST 17 04/19/2012 0526   ALT 8 08/02/2022 1150   ALT 12 04/19/2012 0526   ALKPHOS 96 08/02/2022 1150   ALKPHOS 114 04/19/2012 0526   BILITOT 0.5 08/02/2022 1150   BILITOT 0.3 05/19/2022 0934   BILITOT 0.1 (L) 04/19/2012 0526   GFRNONAA >60 08/02/2022 1150   GFRNONAA >60 04/19/2012 0526   GFRAA 103 03/19/2020 1129   GFRAA >60 04/19/2012 0526    Imaging Studies: No results found.  Assessment and Plan:   [name] is a 66 y.o. y/o female has been referred for a history of a cyst seen on a imaging exam.  The cyst is over 4 cm but asymptomatic requiring no further workup such as surgical excision if it was symptomatic.  The patient will have her medication refilled for her PPI.  The patient does report that she had COVID recently but has been doing much better with that and has no further issues except some loss of taste.  The patient will follow-up as  needed.  Follow Up Instructions:  I discussed the assessment and treatment plan with the patient. The patient was provided an opportunity to ask questions and all were answered. The patient agreed with the plan and demonstrated an understanding of the instructions.   The patient was advised to call back or seek an in-person evaluation if the symptoms worsen or if the condition fails to improve as anticipated.  I provided 25 minutes of non-face-to-face time during this encounter including chart review In preparation for the encounter.   Lindsay Lame, MD  Speech recognition software was used to dictate the above note.

## 2023-02-13 ENCOUNTER — Other Ambulatory Visit: Payer: Self-pay | Admitting: Family Medicine

## 2023-02-13 DIAGNOSIS — E7800 Pure hypercholesterolemia, unspecified: Secondary | ICD-10-CM

## 2023-02-15 NOTE — Telephone Encounter (Signed)
Requested Prescriptions  Pending Prescriptions Disp Refills   atorvastatin (LIPITOR) 40 MG tablet [Pharmacy Med Name: Atorvastatin Calcium 40 MG Oral Tablet] 100 tablet 0    Sig: TAKE 1 TABLET BY MOUTH DAILY     Cardiovascular:  Antilipid - Statins Failed - 02/13/2023 10:37 PM      Failed - Lipid Panel in normal range within the last 12 months    Cholesterol, Total  Date Value Ref Range Status  05/19/2022 155 100 - 199 mg/dL Final   LDL Chol Calc (NIH)  Date Value Ref Range Status  05/19/2022 88 0 - 99 mg/dL Final   HDL  Date Value Ref Range Status  05/19/2022 46 >39 mg/dL Final   Triglycerides  Date Value Ref Range Status  05/19/2022 116 0 - 149 mg/dL Final         Passed - Patient is not pregnant      Passed - Valid encounter within last 12 months    Recent Outpatient Visits           6 months ago Hepatic cyst   [facility] Jacky Kindle, FNP   6 months ago History of COVID-19   [facility] Jacky Kindle, FNP   9 months ago Dysuria   [facility] [employer], [place], [place]   11 months ago Sorethroat   [facility] Merita Norton T, FNP   12 months ago Upper respiratory tract infection, unspecified type   [facility] Alfredia Ferguson, [place]

## 2023-03-29 ENCOUNTER — Other Ambulatory Visit: Payer: Self-pay | Admitting: Family Medicine

## 2023-03-29 DIAGNOSIS — I10 Essential (primary) hypertension: Secondary | ICD-10-CM

## 2023-04-05 ENCOUNTER — Telehealth: Payer: Self-pay | Admitting: Gastroenterology

## 2023-04-05 ENCOUNTER — Other Ambulatory Visit: Payer: Self-pay | Admitting: Family Medicine

## 2023-04-05 DIAGNOSIS — Z1231 Encounter for screening mammogram for malignant neoplasm of breast: Secondary | ICD-10-CM

## 2023-04-05 NOTE — Telephone Encounter (Signed)
Patient called back to speak with Marcelino Duster.

## 2023-04-06 ENCOUNTER — Encounter: Payer: Self-pay | Admitting: Family Medicine

## 2023-04-06 ENCOUNTER — Ambulatory Visit (INDEPENDENT_AMBULATORY_CARE_PROVIDER_SITE_OTHER): Payer: 59 | Admitting: Family Medicine

## 2023-04-06 VITALS — BP 127/67 | HR 62 | Ht 65.0 in | Wt 165.7 lb

## 2023-04-06 DIAGNOSIS — K7689 Other specified diseases of liver: Secondary | ICD-10-CM

## 2023-04-06 DIAGNOSIS — J301 Allergic rhinitis due to pollen: Secondary | ICD-10-CM

## 2023-04-06 DIAGNOSIS — E663 Overweight: Secondary | ICD-10-CM

## 2023-04-06 DIAGNOSIS — J4530 Mild persistent asthma, uncomplicated: Secondary | ICD-10-CM

## 2023-04-06 DIAGNOSIS — R7303 Prediabetes: Secondary | ICD-10-CM

## 2023-04-06 DIAGNOSIS — I10 Essential (primary) hypertension: Secondary | ICD-10-CM | POA: Diagnosis not present

## 2023-04-06 DIAGNOSIS — G8929 Other chronic pain: Secondary | ICD-10-CM

## 2023-04-06 DIAGNOSIS — Z6827 Body mass index (BMI) 27.0-27.9, adult: Secondary | ICD-10-CM

## 2023-04-06 DIAGNOSIS — M8589 Other specified disorders of bone density and structure, multiple sites: Secondary | ICD-10-CM

## 2023-04-06 DIAGNOSIS — M546 Pain in thoracic spine: Secondary | ICD-10-CM

## 2023-04-06 DIAGNOSIS — K2100 Gastro-esophageal reflux disease with esophagitis, without bleeding: Secondary | ICD-10-CM | POA: Diagnosis not present

## 2023-04-06 MED ORDER — FLUTICASONE PROPIONATE 50 MCG/ACT NA SUSP: 2.0000 | Freq: Every day | NASAL | 11 refills | Status: AC

## 2023-04-06 MED ORDER — ALBUTEROL SULFATE HFA 108 (90 BASE) MCG/ACT IN AERS
2.0000 | INHALATION_SPRAY | RESPIRATORY_TRACT | 11 refills | Status: DC | PRN
Start: 2023-04-06 — End: 2023-11-16

## 2023-04-06 MED ORDER — AMLODIPINE BESYLATE 10 MG PO TABS
10.0000 mg | ORAL_TABLET | Freq: Every day | ORAL | 3 refills | Status: DC
Start: 2023-04-06 — End: 2023-11-14

## 2023-04-06 MED ORDER — MONTELUKAST SODIUM 10 MG PO TABS
10.0000 mg | ORAL_TABLET | Freq: Every day | ORAL | 3 refills | Status: DC
Start: 2023-04-06 — End: 2023-11-27

## 2023-04-06 MED ORDER — CYCLOBENZAPRINE HCL 5 MG PO TABS
5.0000 mg | ORAL_TABLET | Freq: Every day | ORAL | 3 refills | Status: DC
Start: 2023-04-06 — End: 2024-03-29

## 2023-04-06 NOTE — Assessment & Plan Note (Signed)
Chronic, stable Continue protonix 40 mg

## 2023-04-06 NOTE — Assessment & Plan Note (Signed)
Chronic, stable Continue norvasc 10 mg daily Repeat CBC, CMP, TSH

## 2023-04-06 NOTE — Assessment & Plan Note (Signed)
Chronic, stable Continue low dose flexeril to assist

## 2023-04-06 NOTE — Assessment & Plan Note (Signed)
Chronic, stable No recent concerns Repeat CMP

## 2023-04-06 NOTE — Assessment & Plan Note (Signed)
Chronic, worsening Continue PRN albuterol Add singulair nightly

## 2023-04-06 NOTE — Patient Instructions (Signed)
The CDC recommends two doses of Shingrix (the new shingles vaccine) separated by 2 to 6 months for adults age 67 years and older. I recommend checking with your insurance plan regarding coverage for this vaccine.    

## 2023-04-06 NOTE — Assessment & Plan Note (Signed)
Chronic, stable Continue flonase

## 2023-04-06 NOTE — Assessment & Plan Note (Signed)
Chronic, stable Repeat labs for A1c Continue to recommend balanced, lower carb meals. Smaller meal size, adding snacks. Choosing water as drink of choice and increasing purposeful exercise.

## 2023-04-06 NOTE — Assessment & Plan Note (Signed)
Chronic, improved Body mass index is 27.57 kg/m. Discussed importance of healthy weight management Discussed diet and exercise

## 2023-04-06 NOTE — Progress Notes (Signed)
Established patient visit   Patient: [name]   DOB: 28-Apr-1956   67 y.o. Female  MRN: [id number] Visit Date: 04/06/2023  Today's healthcare provider: Jacky Kindle, FNP  Introduced to nurse practitioner role and practice setting.  All questions answered.  Discussed provider/patient relationship and expectations.  Subjective    HPI HPI     Medical Management of Chronic Issues    Additional comments: Follow up on medication, patient stated everything is going ok       Last edited by Rolly Salter, CMA on 04/06/2023  9:49 AM.      Medications: Outpatient Medications Prior to Visit  Medication Sig   amLODipine (NORVASC) 10 MG tablet TAKE 1 TABLET BY MOUTH DAILY   atorvastatin (LIPITOR) 40 MG tablet TAKE 1 TABLET BY MOUTH DAILY   cyclobenzaprine (FLEXERIL) 10 MG tablet Take 0.5 tablets (5 mg total) by mouth at bedtime.   fluticasone (FLONASE) 50 MCG/ACT nasal spray Place 2 sprays into both nostrils daily.   pantoprazole (PROTONIX) 40 MG tablet Take 1 tablet (40 mg total) by mouth daily.   [DISCONTINUED] albuterol (VENTOLIN HFA) 108 (90 Base) MCG/ACT inhaler INHALE 2 PUFFS INTO THE LUNGS EVERY 6 HOURS AS NEEDED FOR WHEEZING OR SHORTNESS OF BREATH   [DISCONTINUED] clopidogrel (PLAVIX) 75 MG tablet TAKE 1 TABLET(75 MG) BY MOUTH DAILY   [DISCONTINUED] Omega-3 Fatty Acids (FISH OIL) 1200 MG CAPS Take 1 capsule by mouth daily.   [DISCONTINUED] potassium chloride (KLOR-CON) 10 MEQ tablet Take 10 mEq by mouth daily. M-F only   [DISCONTINUED] VITAMIN D PO Take 5,000 Units by mouth daily.   No facility-administered medications prior to visit.     Objective    BP 127/67 (BP Location: Right Arm, Patient Position: Sitting, Cuff Size: Normal)   Pulse 62   Ht 5\' 5"  (1.651 m)   Wt 165 lb 11.2 oz (75.2 kg)   BMI 27.57 kg/m   Physical Exam Vitals and nursing note reviewed.  Constitutional:      General: She is not in acute distress.    Appearance: Normal appearance. She is  overweight. She is not ill-appearing, toxic-appearing or diaphoretic.  HENT:     Head: Normocephalic and atraumatic.  Cardiovascular:     Rate and Rhythm: Normal rate and regular rhythm.     Pulses: Normal pulses.     Heart sounds: Normal heart sounds. No murmur heard.    No friction rub. No gallop.  Pulmonary:     Effort: Pulmonary effort is normal. No respiratory distress.     Breath sounds: Normal breath sounds. No stridor. No wheezing, rhonchi or rales.  Chest:     Chest wall: No tenderness.  Musculoskeletal:        General: No swelling, tenderness, deformity or signs of injury. Normal range of motion.     Right lower leg: No edema.     Left lower leg: No edema.  Skin:    General: Skin is warm and dry.     Capillary Refill: Capillary refill takes less than 2 seconds.     Coloration: Skin is not jaundiced or pale.     Findings: No bruising, erythema, lesion or rash.  Neurological:     General: No focal deficit present.     Mental Status: She is alert and oriented to person, place, and time. Mental status is at baseline.     Cranial Nerves: No cranial nerve deficit.     Sensory: No sensory deficit.  Motor: No weakness.     Coordination: Coordination normal.  Psychiatric:        Mood and Affect: Mood normal.        Behavior: Behavior normal.        Thought Content: Thought content normal.        Judgment: Judgment normal.      No results found for any visits on 04/06/23.  Assessment & Plan     Problem List Items Addressed This Visit       Cardiovascular and Mediastinum   Primary hypertension - Primary    Chronic, stable Continue norvasc 10 mg daily Repeat CBC, CMP, TSH      Relevant Orders   CBC with Differential/Platelet   Comprehensive Metabolic Panel (CMET)   TSH   Lipid panel     Respiratory   Acute seasonal allergic rhinitis due to pollen    Chronic, stable Continue flonase       Mild persistent asthma without complication    Chronic,  worsening Continue PRN albuterol Add singulair nightly         Digestive   Gastroesophageal reflux disease with esophagitis without hemorrhage    Chronic, stable Continue protonix 40 mg        Hepatic cyst    Chronic, stable No recent concerns Repeat CMP        Musculoskeletal and Integument   Osteopenia of multiple sites    Chronic, stable Walking for exercise Previously on Vit D supplementation; repeat labs       Relevant Orders   Vitamin D (25 hydroxy)     Other   Chronic left-sided thoracic back pain    Chronic, stable Continue low dose flexeril to assist       Overweight with body mass index (BMI) of 27 to 27.9 in adult    Chronic, improved Body mass index is 27.57 kg/m. Discussed importance of healthy weight management Discussed diet and exercise       Prediabetes    Chronic, stable Repeat labs for A1c Continue to recommend balanced, lower carb meals. Smaller meal size, adding snacks. Choosing water as drink of choice and increasing purposeful exercise.       Relevant Orders   Hemoglobin A1c   Return in about 3 months (around 07/06/2023) for annual examination.     Leilani Merl, FNP, have reviewed all documentation for this visit. The documentation on 04/06/23 for the exam, diagnosis, procedures, and orders are all accurate and complete.  Jacky Kindle, FNP  [facility] [phone] (phone) [phone] (fax)  [facility]

## 2023-04-06 NOTE — Assessment & Plan Note (Signed)
Chronic, stable Walking for exercise Previously on Vit D supplementation; repeat labs

## 2023-04-07 LAB — HEMOGLOBIN A1C
Est. average glucose Bld gHb Est-mCnc: 134 mg/dL
Hgb A1c MFr Bld: 6.3 % — ABNORMAL HIGH (ref 4.8–5.6)

## 2023-04-07 LAB — COMPREHENSIVE METABOLIC PANEL
ALT: 6 IU/L (ref 0–32)
AST: 13 IU/L (ref 0–40)
Albumin: 4.4 g/dL (ref 3.9–4.9)
Alkaline Phosphatase: 106 IU/L (ref 44–121)
BUN/Creatinine Ratio: 14 (ref 12–28)
BUN: 10 mg/dL (ref 8–27)
Bilirubin Total: 0.3 mg/dL (ref 0.0–1.2)
CO2: 27 mmol/L (ref 20–29)
Calcium: 9.9 mg/dL (ref 8.7–10.3)
Chloride: 103 mmol/L (ref 96–106)
Creatinine, Ser: 0.73 mg/dL (ref 0.57–1.00)
Globulin, Total: 2.4 g/dL (ref 1.5–4.5)
Glucose: 89 mg/dL (ref 70–99)
Potassium: 3.8 mmol/L (ref 3.5–5.2)
Sodium: 144 mmol/L (ref 134–144)
Total Protein: 6.8 g/dL (ref 6.0–8.5)
eGFR: 91 mL/min/{1.73_m2} (ref >59)

## 2023-04-07 LAB — CBC WITH DIFFERENTIAL/PLATELET
Basophils Absolute: 0 10*3/uL (ref 0.0–0.2)
Basos: 1 %
EOS (ABSOLUTE): 0.1 10*3/uL (ref 0.0–0.4)
Eos: 1 %
Hematocrit: 43.6 % (ref 34.0–46.6)
Hemoglobin: 13.7 g/dL (ref 11.1–15.9)
Immature Grans (Abs): 0 10*3/uL (ref 0.0–0.1)
Immature Granulocytes: 0 %
Lymphocytes Absolute: 2.9 10*3/uL (ref 0.7–3.1)
Lymphs: 51 %
MCH: 27.7 pg (ref 26.6–33.0)
MCHC: 31.4 g/dL — ABNORMAL LOW (ref 31.5–35.7)
MCV: 88 fL (ref 79–97)
Monocytes Absolute: 0.4 10*3/uL (ref 0.1–0.9)
Monocytes: 7 %
Neutrophils Absolute: 2.3 10*3/uL (ref 1.4–7.0)
Neutrophils: 40 %
Platelets: 304 10*3/uL (ref 150–450)
RBC: 4.94 x10E6/uL (ref 3.77–5.28)
RDW: 13.2 % (ref 11.7–15.4)
WBC: 5.7 10*3/uL (ref 3.4–10.8)

## 2023-04-07 LAB — TSH: TSH: 0.982 u[IU]/mL (ref 0.450–4.500)

## 2023-04-07 LAB — LIPID PANEL
Chol/HDL Ratio: 3.2 ratio (ref 0.0–4.4)
Cholesterol, Total: 149 mg/dL (ref 100–199)
HDL: 46 mg/dL (ref >39)
LDL Chol Calc (NIH): 80 mg/dL (ref 0–99)
Triglycerides: 132 mg/dL (ref 0–149)
VLDL Cholesterol Cal: 23 mg/dL (ref 5–40)

## 2023-04-07 LAB — VITAMIN D 25 HYDROXY (VIT D DEFICIENCY, FRACTURES): Vit D, 25-Hydroxy: 43.7 ng/mL (ref 30.0–100.0)

## 2023-04-11 ENCOUNTER — Other Ambulatory Visit: Payer: Self-pay | Admitting: Family Medicine

## 2023-04-18 ENCOUNTER — Other Ambulatory Visit: Payer: Self-pay | Admitting: Family Medicine

## 2023-04-18 ENCOUNTER — Telehealth: Payer: Self-pay | Admitting: Family Medicine

## 2023-04-18 MED ORDER — METFORMIN HCL ER 750 MG PO TB24: 750.0000 mg | ORAL_TABLET | Freq: Two times a day (BID) | ORAL | 3 refills | Status: DC

## 2023-04-18 NOTE — Telephone Encounter (Signed)
Patient called in requesting provider prescribe Metformin 750mg  and have it sent to  [id number] Cheree Ditto, [place] - [address] AT [facility] OF [address] [facility] Phone: [phone]  Fax: [phone]

## 2023-04-19 ENCOUNTER — Ambulatory Visit: Payer: Self-pay | Admitting: *Deleted

## 2023-04-19 NOTE — Telephone Encounter (Signed)
  Chief Complaint: UTI symptoms Symptoms: Pressure, frequency, urgency, still feels like her bladder is full after urinating. Frequency: Started Monday.   She has tried OTC medications and drinking cranberry juice.    Pertinent Negatives: Patient denies flank pain, fever or blood in urine.   Has a history of UTIs. Disposition: [] ED /[] Urgent Care (no appt availability in office) / [x] Appointment(In office/virtual)/ []  [place] [facility]/ [] Home Care/ [] Refused Recommended Disposition /[]  Mobile Bus/ []  Follow-up with PCP Additional Notes: Appt made with Lindsay Norton, FNP for 04/20/2023 at 1:00.

## 2023-04-19 NOTE — Telephone Encounter (Signed)
Message from Brighton C sent at 04/19/2023  8:00 AM EDT  Summary: urinary discomfort / rx req   The patient has experienced urinary concerns (frequent urination, pressure and discomfort)  The patient has previously taken otc medication with little to no effect  The patient would like to be prescribed something to treat their discomfort  Please contact further when possible          Call History  Contact Date/Time Type Contact Phone/Fax User  04/19/2023 07:49 AM EDT Phone (Incoming) [name], [name] (Self) [phone] Judie Petit) Coley, Everette A   Reason for Disposition  Age > 50 years  Answer Assessment - Initial Assessment Questions 1. SEVERITY: "How bad is the pain?"  (e.g., Scale 1-10; mild, moderate, or severe)   - MILD (1-3): complains slightly about urination hurting   - MODERATE (4-7): interferes with normal activities     - SEVERE (8-10): excruciating, unwilling or unable to urinate because of the pain      Pressure, feel like I need to pee even after I pee. 2. FREQUENCY: "How many times have you had painful urination today?"      Yes 3. PATTERN: "Is pain present every time you urinate or just sometimes?"      Yes the pressure   No burning 4. ONSET: "When did the painful urination start?"      Started day before 5. FEVER: "Do you have a fever?" If Yes, ask: "What is your temperature, how was it measured, and when did it start?"     No 6. PAST UTI: "Have you had a urine infection before?" If Yes, ask: "When was the last time?" and "What happened that time?"      Yes    7. CAUSE: "What do you think is causing the painful urination?"  (e.g., UTI, scratch, Herpes sore)     UTI 8. OTHER SYMPTOMS: "Do you have any other symptoms?" (e.g., blood in urine, flank pain, genital sores, urgency, vaginal discharge)     Urgency, frequency, no fever no blood 9. PREGNANCY: "Is there any chance you are pregnant?" "When was your last menstrual period?"     N/A due to age  Protocols used:  Urination Pain - Female-A-AH

## 2023-04-19 NOTE — Telephone Encounter (Signed)
Patient advised. Verbalized understanding

## 2023-04-20 ENCOUNTER — Ambulatory Visit (INDEPENDENT_AMBULATORY_CARE_PROVIDER_SITE_OTHER): Payer: 59 | Admitting: Family Medicine

## 2023-04-20 ENCOUNTER — Encounter: Payer: Self-pay | Admitting: Family Medicine

## 2023-04-20 VITALS — BP 139/65 | HR 66 | Wt 165.7 lb

## 2023-04-20 DIAGNOSIS — R319 Hematuria, unspecified: Secondary | ICD-10-CM | POA: Insufficient documentation

## 2023-04-20 DIAGNOSIS — R8290 Unspecified abnormal findings in urine: Secondary | ICD-10-CM | POA: Insufficient documentation

## 2023-04-20 DIAGNOSIS — R3989 Other symptoms and signs involving the genitourinary system: Secondary | ICD-10-CM | POA: Diagnosis not present

## 2023-04-20 DIAGNOSIS — R350 Frequency of micturition: Secondary | ICD-10-CM | POA: Diagnosis not present

## 2023-04-20 DIAGNOSIS — D72829 Elevated white blood cell count, unspecified: Secondary | ICD-10-CM | POA: Insufficient documentation

## 2023-04-20 DIAGNOSIS — N8110 Cystocele, unspecified: Secondary | ICD-10-CM | POA: Insufficient documentation

## 2023-04-20 LAB — POCT URINALYSIS DIPSTICK
Bilirubin, UA: NEGATIVE
Glucose, UA: NEGATIVE
Ketones, UA: NEGATIVE
Nitrite, UA: POSITIVE
Protein, UA: POSITIVE — AB
Spec Grav, UA: 1.01 (ref 1.010–1.025)
Urobilinogen, UA: 0.2 E.U./dL
pH, UA: 6.5 (ref 5.0–8.0)

## 2023-04-20 MED ORDER — NITROFURANTOIN MONOHYD MACRO 100 MG PO CAPS
100.0000 mg | ORAL_CAPSULE | Freq: Two times a day (BID) | ORAL | 0 refills | Status: DC
Start: 2023-04-20 — End: 2023-06-26

## 2023-04-20 NOTE — Progress Notes (Signed)
Established patient visit   Patient: [name]   DOB: 14-Jan-1956   67 y.o. Female  MRN: [id number] Visit Date: 04/20/2023  Today's healthcare provider: Jacky Kindle, FNP  Introduced to nurse practitioner role and practice setting.  All questions answered.  Discussed provider/patient relationship and expectations.  Subjective    Urinary Tract Infection    HPI     Urinary Tract Infection   This is a new problem.  Recent episode started in the past 7 days.  The problem has been unchanged since onset.  Severity of the pain is mild.  The problem occurs every urination.  The patient  has not been recently treated for similar symptoms.  Abdominal Pain: Absent.  Back Pain: Absent.  Chills: Absent.  Cloudy malodorus urine: Absent.  Constipation: Absent.  Cramping: Absent.  Diarrhea: Present.  Discharge: Absent.  Fever: Absent.  Hematuria: Absent.  Nausea: Absent.  Vomiting: Absent. Additional comments: Frequency and pressure on bladder, urgency and still feels like her bladder is full after urinating. Patient has tried drinking cranberry juice and otc treatment (Azo) last taken yesterday      Last edited by Acey Lav, CMA on 04/20/2023  1:12 PM.      Medications: Outpatient Medications Prior to Visit  Medication Sig   albuterol (VENTOLIN HFA) 108 (90 Base) MCG/ACT inhaler Inhale 2 puffs into the lungs every 4 (four) hours as needed for wheezing or shortness of breath.   amLODipine (NORVASC) 10 MG tablet Take 1 tablet (10 mg total) by mouth daily. TAKE 1 TABLET BY MOUTH DAILY   atorvastatin (LIPITOR) 40 MG tablet TAKE 1 TABLET BY MOUTH DAILY   cyclobenzaprine (FLEXERIL) 5 MG tablet Take 1 tablet (5 mg total) by mouth at bedtime.   fluticasone (FLONASE) 50 MCG/ACT nasal spray Place 2 sprays into both nostrils daily.   metFORMIN (GLUCOPHAGE-XR) 750 MG 24 hr tablet Take 1 tablet (750 mg total) by mouth 2 (two) times daily before a meal.   montelukast (SINGULAIR) 10 MG tablet Take  1 tablet (10 mg total) by mouth at bedtime.   pantoprazole (PROTONIX) 40 MG tablet Take 1 tablet (40 mg total) by mouth daily.   No facility-administered medications prior to visit.     Objective    BP 139/65   Pulse 66   Wt 165 lb 11.2 oz (75.2 kg)   SpO2 100%   BMI 27.57 kg/m   Physical Exam Vitals and nursing note reviewed.  Constitutional:      General: She is not in acute distress.    Appearance: Normal appearance. She is overweight. She is not ill-appearing, toxic-appearing or diaphoretic.  HENT:     Head: Normocephalic and atraumatic.  Cardiovascular:     Rate and Rhythm: Normal rate and regular rhythm.     Pulses: Normal pulses.     Heart sounds: Normal heart sounds. No murmur heard.    No friction rub. No gallop.  Pulmonary:     Effort: Pulmonary effort is normal. No respiratory distress.     Breath sounds: Normal breath sounds. No stridor. No wheezing, rhonchi or rales.  Chest:     Chest wall: No tenderness.  Abdominal:     General: Bowel sounds are normal. There is no distension.     Palpations: Abdomen is soft.     Tenderness: There is no abdominal tenderness. There is no right CVA tenderness, left CVA tenderness, guarding or rebound.  Musculoskeletal:        General:  No swelling, tenderness, deformity or signs of injury. Normal range of motion.     Right lower leg: No edema.     Left lower leg: No edema.  Skin:    General: Skin is warm and dry.     Capillary Refill: Capillary refill takes less than 2 seconds.     Coloration: Skin is not jaundiced or pale.     Findings: No bruising, erythema, lesion or rash.  Neurological:     General: No focal deficit present.     Mental Status: She is alert and oriented to person, place, and time. Mental status is at baseline.     Cranial Nerves: No cranial nerve deficit.     Sensory: No sensory deficit.     Motor: No weakness.     Coordination: Coordination normal.  Psychiatric:        Mood and Affect: Mood normal.         Behavior: Behavior normal.        Thought Content: Thought content normal.        Judgment: Judgment normal.     Results for orders placed or performed in visit on 04/20/23  POCT Urinalysis Dipstick  Result Value Ref Range   Color, UA     Clarity, UA     Glucose, UA Negative Negative   Bilirubin, UA negative    Ketones, UA negative    Spec Grav, UA 1.010 1.010 - 1.025   Blood, UA moderate    pH, UA 6.5 5.0 - 8.0   Protein, UA Positive (A) Negative   Urobilinogen, UA 0.2 0.2 or 1.0 E.U./dL   Nitrite, UA positive    Leukocytes, UA Large (3+) (A) Negative   Appearance     Odor      Assessment & Plan     Problem List Items Addressed This Visit       Genitourinary   Bladder prolapse, female, acquired    Chronic with recurrent UTI x2 in last year      Relevant Orders   Ambulatory referral to Urogynecology     Other   Frequency of urination    Symptoms of UTI x4 days; in office UA positive; recommend urine micro with cx; start ABX      Relevant Medications   nitrofurantoin, macrocrystal-monohydrate, (MACROBID) 100 MG capsule   Other Relevant Orders   POCT Urinalysis Dipstick (Completed)   Urine Culture   Urinalysis, Routine w reflex microscopic   Ambulatory referral to Urogynecology   Hematuria   Relevant Medications   nitrofurantoin, macrocrystal-monohydrate, (MACROBID) 100 MG capsule   Other Relevant Orders   Urine Culture   Urinalysis, Routine w reflex microscopic   Ambulatory referral to Urogynecology   Leukocytosis   Relevant Orders   Urine Culture   Ambulatory referral to Urogynecology   Suspected UTI - Primary   Relevant Medications   nitrofurantoin, macrocrystal-monohydrate, (MACROBID) 100 MG capsule   Other Relevant Orders   POCT Urinalysis Dipstick (Completed)   Urinalysis, Routine w reflex microscopic   Ambulatory referral to Urogynecology   Return in about 11 weeks (around 07/06/2023) for chonic disease management.     Leilani Merl,  FNP, have reviewed all documentation for this visit. The documentation on 04/20/23 for the exam, diagnosis, procedures, and orders are all accurate and complete.  Jacky Kindle, FNP  [facility] [phone] (phone) [phone] (fax)  [facility]

## 2023-04-20 NOTE — Assessment & Plan Note (Signed)
Chronic with recurrent UTI x2 in last year

## 2023-04-20 NOTE — Assessment & Plan Note (Addendum)
Symptoms of UTI x4 days; in office UA positive; recommend urine micro with cx; start ABX

## 2023-04-21 LAB — URINALYSIS, ROUTINE W REFLEX MICROSCOPIC
Bilirubin, UA: NEGATIVE
Glucose, UA: NEGATIVE
Ketones, UA: NEGATIVE
Nitrite, UA: POSITIVE — AB
Specific Gravity, UA: 1.018 (ref 1.005–1.030)
Urobilinogen, Ur: 1 mg/dL (ref 0.2–1.0)
pH, UA: 6.5 (ref 5.0–7.5)

## 2023-04-21 LAB — MICROSCOPIC EXAMINATION
Bacteria, UA: NONE SEEN
Casts: NONE SEEN /[LPF]
WBC, UA: >30 /[HPF] — AB (ref 0–5)

## 2023-04-21 LAB — SPECIMEN STATUS REPORT

## 2023-04-23 LAB — URINE CULTURE

## 2023-04-23 LAB — SPECIMEN STATUS REPORT

## 2023-04-25 ENCOUNTER — Other Ambulatory Visit: Payer: Self-pay | Admitting: Family Medicine

## 2023-04-25 DIAGNOSIS — G8929 Other chronic pain: Secondary | ICD-10-CM

## 2023-04-25 DIAGNOSIS — E7800 Pure hypercholesterolemia, unspecified: Secondary | ICD-10-CM

## 2023-04-25 NOTE — Telephone Encounter (Signed)
Requested Prescriptions  Pending Prescriptions Disp Refills   atorvastatin (LIPITOR) 40 MG tablet [Pharmacy Med Name: Atorvastatin Calcium 40 MG Oral Tablet] 100 tablet 2    Sig: TAKE 1 TABLET BY MOUTH DAILY     Cardiovascular:  Antilipid - Statins Failed - 04/25/2023  4:45 AM      Failed - Lipid Panel in normal range within the last 12 months    Cholesterol, Total  Date Value Ref Range Status  04/06/2023 149 100 - 199 mg/dL Final   LDL Chol Calc (NIH)  Date Value Ref Range Status  04/06/2023 80 0 - 99 mg/dL Final   HDL  Date Value Ref Range Status  04/06/2023 46 >39 mg/dL Final   Triglycerides  Date Value Ref Range Status  04/06/2023 132 0 - 149 mg/dL Final         Passed - Patient is not pregnant      Passed - Valid encounter within last 12 months    Recent Outpatient Visits           5 days ago Suspected UTI   [facility] Jacky Kindle, FNP   2 weeks ago Primary hypertension   [facility] Jacky Kindle, FNP   8 months ago Hepatic cyst   [facility] Jacky Kindle, FNP   9 months ago History of COVID-19   [facility] Jacky Kindle, FNP   12 months ago Dysuria   [facility] [place], [place], PA-C       Future Appointments             In 2 weeks Loleta Chance, MD [facility] at [facility], [facility]   In 2 months Jacky Kindle, FNP [facility], [facility]

## 2023-04-25 NOTE — Telephone Encounter (Signed)
Request was refilled 04/06/23.  Requested Prescriptions  Pending Prescriptions Disp Refills   cyclobenzaprine (FLEXERIL) 10 MG tablet [Pharmacy Med Name: CYCLOBENZAPRINE 10MG  TABLETS] 90 tablet 0    Sig: TAKE 1/2 TABLET BY MOUTH AT BEDTIME     Not Delegated - Analgesics:  Muscle Relaxants Failed - 04/25/2023  7:09 AM      Failed - This refill cannot be delegated      Passed - Valid encounter within last 6 months    Recent Outpatient Visits           5 days ago Suspected UTI   [facility] Jacky Kindle, FNP   2 weeks ago Primary hypertension   [facility] Jacky Kindle, FNP   8 months ago Hepatic cyst   [facility] Jacky Kindle, FNP   9 months ago History of COVID-19   [facility] Jacky Kindle, FNP   12 months ago Dysuria   [facility] [place], [place], PA-C       Future Appointments             In 2 weeks Loleta Chance, MD [facility] Urogynecology at [facility], [facility]   In 2 months Jacky Kindle, FNP [facility]

## 2023-05-04 NOTE — Telephone Encounter (Signed)
  Care Management   Visit Note   Name: [name] MRN: [id number] DOB: [birth year]  Subjective: [name] is a 67 y.o. year old female who is a primary care patient of Jacky Kindle, FNP. The Care Management team was consulted for assistance.      Follow up with [name]. Informed that Leotis Shames has not returned call to the clinic or care team. [name] denies urgent care needs. Agreed to follow up with clinic when Lauren clarifies the documents required for additional resources via Peer Support.   PLAN [name] will call the clinic as needed for assistance.  [facility]/Care Management [phone]

## 2023-05-08 ENCOUNTER — Ambulatory Visit
Admission: RE | Admit: 2023-05-08 | Discharge: 2023-05-08 | Disposition: A | Discharge status: Home / Self Care | Payer: 59 | Source: Ambulatory Visit | Attending: Family Medicine | Admitting: Family Medicine

## 2023-05-08 DIAGNOSIS — Z1231 Encounter for screening mammogram for malignant neoplasm of breast: Secondary | ICD-10-CM | POA: Insufficient documentation

## 2023-05-12 ENCOUNTER — Ambulatory Visit: Payer: Medicaid Other | Admitting: Obstetrics

## 2023-05-12 NOTE — Progress Notes (Deleted)
New Patient Evaluation and Consultation  Referring Provider: Jacky Kindle, FNP PCP: Jacky Kindle, FNP Date of Service: 05/12/2023  SUBJECTIVE Chief Complaint: No chief complaint on file.  History of Present Illness: [name] is a 67 y.o. Black or African-American female seen in consultation at the request of Merita Norton, FNP for evaluation of pelvic organ prolapse and recurrent UTI.    Evaluation on 04/20/23 with urinary frequency, bladder pressure, urgency, sensation of incomplete emptying, and diarrhea with no relief after cranberry juice and Azo. UA + heme/protein/leuk. Rx macrobid with culture 24-50K pansensitive E. Coli  Office evaluation 04/26/22 with dysuria, urinary frequency/urgency and pelvic pressure after yeast infection treated with diflucan following antibiotics for URI. Tried Azo without relief. Rx Keflex.  HbA1C 6.3 on 04/06/23  ***Review of records significant for: ***  Urinary Symptoms: {urine leakage?:24754} Leaks *** time(s) per {days/wks/mos/yrs:310907}.  Pad use: {NUMBERS 1-10:18281} {pad option:24752} per day.   Patient {ACTION; IS/IS ZOX:09604540} bothered by UI symptoms.  Day time voids ***.  Nocturia: *** times per night to void. Voiding dysfunction:  {empties:24755} bladder well.  Patient {DOES NOT does:27190::"does not"} use a catheter to empty bladder.  When urinating, patient feels {urine symptoms:24756} Drinks: *** per day  UTIs: {NUMBERS 1-10:18281} UTI's in the last year.   {ACTIONS;DENIES/REPORTS:21021675::"Denies"} history of {urologic concerns:24757} No results found for the last 90 days.   Pelvic Organ Prolapse Symptoms:                  Patient {denies/ admits to:24761} a feeling of a bulge the vaginal area. It has been present for {NUMBER 1-10:22536} {days/wks/mos/yrs:310907}.  Patient {denies/ admits to:24761} seeing a bulge.  This bulge {ACTION; IS/IS JWJ:19147829} bothersome.  Bowel Symptom: Bowel movements: *** time(s) per  {Time; day/week/month:13537} Stool consistency: {stool consistency:24758} Straining: {yes/no:19897}.  Splinting: {yes/no:19897}.  Incomplete evacuation: {yes/no:19897}.  Patient {denies/ admits to:24761} accidental bowel leakage / fecal incontinence  Occurs: *** time(s) per {Time; day/week/month:13537}  Consistency with leakage: {stool consistency:24758} Bowel regimen: {bowel regimen:24759} Last colonoscopy: Date ***, Results *** [facility] Colonoscopy          Colonoscopy (Every 7 Years) Next due on 02/04/2027    02/04/2020  Surgical Procedure: COLONOSCOPY WITH PROPOFOL   Only the first 1 history entries have been loaded, but more history exists.            Sexual Function Sexually active: {yes/no:19897}.  Sexual orientation: {Sexual Orientation:[phone]} Pain with sex: {pain with sex:24762}  Pelvic Pain {denies/ admits to:24761} pelvic pain Location: *** Pain occurs: *** Prior pain treatment: *** Improved by: *** Worsened by: ***   Past Medical History:  Past Medical History:  Diagnosis Date   Allergy    Anemia    takes Ferrous Sulfate daily   Arthritis    GERD (gastroesophageal reflux disease)    takes OTC med   Heart murmur    Hyperlipidemia    Hypertension    takes Amlodipine daily   Low back pain    occasionally     Past Surgical History:   Past Surgical History:  Procedure Laterality Date   ANTERIOR CERVICAL DECOMP/DISCECTOMY FUSION  10/19/2011   Procedure: ANTERIOR CERVICAL DECOMPRESSION/DISCECTOMY FUSION 1 LEVEL/HARDWARE REMOVAL;  Surgeon: Mariam Dollar, MD;  Location: [facility];  Service: Neurosurgery;  Laterality: N/A;  Cervical four - five  Anterior cervical decompression fusion with redo at six - seven  Rm 33   CARPAL TUNNEL RELEASE     COLONOSCOPY  08/2014   polyps  COLONOSCOPY WITH PROPOFOL N/A 02/04/2020   Procedure: COLONOSCOPY WITH PROPOFOL;  Surgeon: Midge Minium, MD;  Location: [facility] ENDOSCOPY;  Service: Endoscopy;  Laterality: N/A;    ESOPHAGOGASTRODUODENOSCOPY (EGD) WITH PROPOFOL N/A 06/23/2015   Procedure: ESOPHAGOGASTRODUODENOSCOPY (EGD) WITH PROPOFOL;  Surgeon: Midge Minium, MD;  Location: [facility] ENDOSCOPY;  Service: Endoscopy;  Laterality: N/A;   LIPOMA EXCISION Left 03/29/2022   Procedure: EXCISION LIPOMA, left upper extremity;  Surgeon: Henrene Dodge, MD;  Location: [facility] ORS;  Service: General;  Laterality: Left;   NECK SURGERY     POSTERIOR CERVICAL FUSION/FORAMINOTOMY N/A 08/26/2013   Procedure: C/4-5,C/5-6,C/6-7 Posterior Cervical Fusion w/lateral mass fixation;  Surgeon: Mariam Dollar, MD;  Location: [facility];  Service: Neurosurgery;  Laterality: N/A;   TUBAL LIGATION       Past OB/GYN History: OB History  Gravida Para Term Preterm AB Living  4 4          SAB IAB Ectopic Multiple Live Births               # Outcome Date GA Lbr Len/2nd Weight Sex Type Anes PTL Lv  4 Para           3 Para           2 Para           1 Para             Vaginal deliveries: ***,  Forceps/ Vacuum deliveries: ***, Cesarean section: *** Menopausal: {menopausal:24763} Contraception: ***. Last pap smear was ***.  Any history of abnormal pap smears: {yes/no:19897}. No results found for: "DIAGPAP", "HPVHIGH", "ADEQPAP"  Medications: Patient has a current medication list which includes the following prescription(s): albuterol, amlodipine, atorvastatin, cyclobenzaprine, fluticasone, metformin, montelukast, nitrofurantoin (macrocrystal-monohydrate), and pantoprazole.   Allergies: Patient is allergic to aspirin, hydrochlorothiazide, ibuprofen, lisinopril, and sulfa antibiotics.   Social History:  Social History   Tobacco Use   Smoking status: Never   Smokeless tobacco: Never  Vaping Use   Vaping status: Never Used  Substance Use Topics   Alcohol use: No   Drug use: No    Relationship status: {relationship status:24764} Patient lives with ***.   Patient {ACTION; IS/IS OZD:66440347} employed ***. Regular exercise:  {Yes/No:304960894} History of abuse: {Yes/No:304960894}  Family History:   Family History  Problem Relation Age of Onset   Hypertension Mother    Heart disease Mother    Kidney failure Sister    Breast cancer Neg Hx      Review of Systems: ROS   OBJECTIVE Physical Exam: There were no vitals filed for this visit.  Physical Exam   GU / Detailed Urogynecologic Evaluation:  Pelvic Exam: Normal external female genitalia; Bartholin's and Skene's glands normal in appearance; urethral meatus normal in appearance, no urethral masses or discharge.   CST: {gen negative/positive:315881}  Reflexes: bulbocavernosis {DESC; PRESENT/NOT PRESENT:21021351}, anocutaneous {DESC; PRESENT/NOT PRESENT:21021351} ***bilaterally.  Speculum exam reveals normal vaginal mucosa {With/Without:20273} atrophy. Cervix {exam; gyn cervix:30847}. Uterus {exam; pelvic uterus:30849}. Adnexa {exam; adnexa:12223}.    s/p hysterectomy: Speculum exam reveals normal vaginal mucosa {With/Without:20273}  atrophy and normal vaginal cuff.  Adnexa {exam; adnexa:12223}.    With apex supported, anterior compartment defect was {reduced:24765}  Pelvic floor strength {Roman # I-V:19040}/V, puborectalis {Roman # I-V:19040}/V external anal sphincter {Roman # I-V:19040}/V  Pelvic floor musculature: Right levator {Tender/Non-tender:20250}, Right obturator {Tender/Non-tender:20250}, Left levator {Tender/Non-tender:20250}, Left obturator {Tender/Non-tender:20250}  POP-Q:   POP-Q  Aa                                               Ba                                                 C                                                Gh                                               Pb                                               tvl                                                Ap                                               Bp                                                  D      Rectal Exam:  Normal sphincter tone, {rectocele:24766} distal rectocele, enterocoele {DESC; PRESENT/NOT PRESENT:21021351}, no rectal masses, {sign of:24767} dyssynergia when asking the patient to bear down.  Post-Void Residual (PVR) by Bladder Scan: In order to evaluate bladder emptying, we discussed obtaining a postvoid residual and patient agreed to this procedure.  Procedure: The ultrasound unit was placed on the patient's abdomen in the suprapubic region after the patient had voided.      Laboratory Results: Lab Results  Component Value Date   COLORU Yellow 04/20/2023   CLARITYU cloudy 04/26/2022   GLUCOSEUR Negative 04/20/2023   BILIRUBINUR negative 04/20/2023   KETONESU negative 04/20/2023   SPECGRAV 1.010 04/20/2023   RBCUR moderate 04/20/2023   PHUR 6.5 04/20/2023   PROTEINUR Positive (A) 04/20/2023   UROBILINOGEN 0.2 04/20/2023   LEUKOCYTESUR Large (3+) (A) 04/20/2023    Lab Results  Component Value Date   CREATININE 0.73 04/06/2023   CREATININE 0.62 08/02/2022   CREATININE 0.60 07/28/2022    Lab Results  Component Value Date   HGBA1C 6.3 (H) 04/06/2023    Lab Results  Component Value Date   HGB 13.7 04/06/2023     ASSESSMENT AND PLAN Ms. [name] is a 67 y.o. with: No diagnosis  found.    Loleta Chance, MD

## 2023-05-23 ENCOUNTER — Ambulatory Visit (INDEPENDENT_AMBULATORY_CARE_PROVIDER_SITE_OTHER): Payer: 59

## 2023-05-23 ENCOUNTER — Telehealth: Payer: Self-pay

## 2023-05-23 DIAGNOSIS — Z0000 Encounter for general adult medical examination without abnormal findings: Secondary | ICD-10-CM | POA: Diagnosis not present

## 2023-05-23 NOTE — Telephone Encounter (Signed)
Copied from CRM [id number]. Topic: Appointment Scheduling - Scheduling Inquiry for Clinic >> May 23, 2023  4:07 PM Franchot Heidelberg wrote: Reason for CRM: Pt returned missed call from office

## 2023-05-23 NOTE — Progress Notes (Signed)
Subjective:   [name] is a 67 y.o. female who presents for Medicare Annual (Subsequent) preventive examination.  Visit Complete: Virtual I connected with  [name] on 05/23/23 by a audio enabled telemedicine application and verified that I am speaking with the correct person using two identifiers.  Patient Location: Home  Provider Location: Office/Clinic  I discussed the limitations of evaluation and management by telemedicine. The patient expressed understanding and agreed to proceed.  Vital Signs: Because this visit was a virtual/telehealth visit, some criteria may be missing or patient reported. Any vitals not documented were not able to be obtained and vitals that have been documented are patient reported.   Cardiac Risk Factors include: advanced age (>69men, >56 women);hypertension     Objective:    There were no vitals filed for this visit. There is no height or weight on file to calculate BMI.     05/23/2023    4:07 PM 07/02/2022    9:50 AM 05/17/2022   11:08 AM 03/29/2022    6:25 AM 03/21/2022    9:34 AM 08/18/2021   10:02 AM 07/27/2021    7:55 AM  Advanced Directives  Does Patient Have a Medical Advance Directive? No No No No No No No  Would patient like information on creating a medical advance directive? No - Patient declined  No - Patient declined   Yes (MAU/Ambulatory/Procedural Areas - Information given) No - Patient declined    Current Medications (verified) Outpatient Encounter Medications as of 05/23/2023  Medication Sig   albuterol (VENTOLIN HFA) 108 (90 Base) MCG/ACT inhaler Inhale 2 puffs into the lungs every 4 (four) hours as needed for wheezing or shortness of breath.   amLODipine (NORVASC) 10 MG tablet Take 1 tablet (10 mg total) by mouth daily. TAKE 1 TABLET BY MOUTH DAILY   atorvastatin (LIPITOR) 40 MG tablet TAKE 1 TABLET BY MOUTH DAILY   cyclobenzaprine (FLEXERIL) 5 MG tablet Take 1 tablet (5 mg total) by mouth at bedtime.    fluticasone (FLONASE) 50 MCG/ACT nasal spray Place 2 sprays into both nostrils daily.   metFORMIN (GLUCOPHAGE-XR) 750 MG 24 hr tablet Take 1 tablet (750 mg total) by mouth 2 (two) times daily before a meal.   montelukast (SINGULAIR) 10 MG tablet Take 1 tablet (10 mg total) by mouth at bedtime.   pantoprazole (PROTONIX) 40 MG tablet Take 1 tablet (40 mg total) by mouth daily.   nitrofurantoin, macrocrystal-monohydrate, (MACROBID) 100 MG capsule Take 1 capsule (100 mg total) by mouth 2 (two) times daily. (Patient not taking: Reported on 05/23/2023)   No facility-administered encounter medications on file as of 05/23/2023.    Allergies (verified) Aspirin, Hydrochlorothiazide, Ibuprofen, Lisinopril, and Sulfa antibiotics   History: Past Medical History:  Diagnosis Date   Allergy    Anemia    takes Ferrous Sulfate daily   Arthritis    GERD (gastroesophageal reflux disease)    takes OTC med   Heart murmur    Hyperlipidemia    Hypertension    takes Amlodipine daily   Low back pain    occasionally   Past Surgical History:  Procedure Laterality Date   ANTERIOR CERVICAL DECOMP/DISCECTOMY FUSION  10/19/2011   Procedure: ANTERIOR CERVICAL DECOMPRESSION/DISCECTOMY FUSION 1 LEVEL/HARDWARE REMOVAL;  Surgeon: Mariam Dollar, MD;  Location: [facility];  Service: Neurosurgery;  Laterality: N/A;  Cervical four - five  Anterior cervical decompression fusion with redo at six - seven  Rm 33   CARPAL TUNNEL RELEASE  COLONOSCOPY  08/2014   polyps   COLONOSCOPY WITH PROPOFOL N/A 02/04/2020   Procedure: COLONOSCOPY WITH PROPOFOL;  Surgeon: Midge Minium, MD;  Location: [facility] ENDOSCOPY;  Service: Endoscopy;  Laterality: N/A;   ESOPHAGOGASTRODUODENOSCOPY (EGD) WITH PROPOFOL N/A 06/23/2015   Procedure: ESOPHAGOGASTRODUODENOSCOPY (EGD) WITH PROPOFOL;  Surgeon: Midge Minium, MD;  Location: [facility] ENDOSCOPY;  Service: Endoscopy;  Laterality: N/A;   LIPOMA EXCISION Left 03/29/2022   Procedure: EXCISION LIPOMA,  left upper extremity;  Surgeon: Henrene Dodge, MD;  Location: [facility] ORS;  Service: General;  Laterality: Left;   NECK SURGERY     POSTERIOR CERVICAL FUSION/FORAMINOTOMY N/A 08/26/2013   Procedure: C/4-5,C/5-6,C/6-7 Posterior Cervical Fusion w/lateral mass fixation;  Surgeon: Mariam Dollar, MD;  Location: [facility];  Service: Neurosurgery;  Laterality: N/A;   TUBAL LIGATION     Family History  Problem Relation Age of Onset   Hypertension Mother    Heart disease Mother    Kidney failure Sister    Breast cancer Neg Hx    Social History   Socioeconomic History   Marital status: Single    Spouse name: Not on file   Number of children: 3   Years of education: Not on file   Highest education level: Some college, no degree  Occupational History   Occupation: disable  Tobacco Use   Smoking status: Never   Smokeless tobacco: Never  Vaping Use   Vaping status: Never Used  Substance and Sexual Activity   Alcohol use: No   Drug use: No   Sexual activity: Not on file  Other Topics Concern   Not on file  Social History Narrative   Not on file   Social Determinants of Health   Financial Resource Strain: Low Risk  (05/23/2023)   Overall Financial Resource Strain (CARDIA)    Difficulty of Paying Living Expenses: Not hard at all  Food Insecurity: No Food Insecurity (05/23/2023)   Hunger Vital Sign    Worried About Running Out of Food in the Last Year: Never true    Ran Out of Food in the Last Year: Never true  Transportation Needs: No Transportation Needs (05/23/2023)   PRAPARE - Administrator, Civil Service (Medical): No    Lack of Transportation (Non-Medical): No  Physical Activity: Sufficiently Active (05/23/2023)   Exercise Vital Sign    Days of Exercise per Week: 3 days    Minutes of Exercise per Session: 90 min  Stress: No Stress Concern Present (05/23/2023)   [employer] of Occupational Health - Occupational Stress Questionnaire    Feeling of Stress : Not  at all  Social Connections: Socially Isolated (05/23/2023)   Social Connection and Isolation Panel [NHANES]    Frequency of Communication with Friends and Family: Twice a week    Frequency of Social Gatherings with Friends and Family: Never    Attends Religious Services: Never    Database administrator or Organizations: No    Attends Engineer, structural: Never    Marital Status: Never married    Tobacco Counseling Counseling given: Not Answered   Clinical Intake:  Pre-visit preparation completed: Yes  Pain : No/denies pain     Nutritional Status: BMI 25 -29 Overweight Nutritional Risks: None Diabetes: Yes CBG done?: No Did pt. bring in CBG monitor from home?: No  How often do you need to have someone help you when you read instructions, pamphlets, or other written materials from your doctor or pharmacy?: 1 - Never  Interpreter Needed?: No  Information entered by :: Kennedy Bucker, LPN   Activities of Daily Living    05/23/2023    4:08 PM 07/28/2022    9:13 AM  In your present state of health, do you have any difficulty performing the following activities:  Hearing? 0 0  Vision? 0 0  Difficulty concentrating or making decisions? 0 0  Walking or climbing stairs? 0 1  Dressing or bathing? 0 0  Doing errands, shopping? 0 0  Preparing Food and eating ? N   Using the Toilet? N   In the past six months, have you accidently leaked urine? N   Do you have problems with loss of bowel control? N   Managing your Medications? N   Managing your Finances? N   Housekeeping or managing your Housekeeping? N     Patient Care Team: Jacky Kindle, FNP as PCP - General (Family Medicine) Midge Minium, MD as Consulting Physician (Gastroenterology) Donalee Citrin, MD as Consulting Physician (Neurosurgery)  Indicate any recent Medical Services you may have received from other than [facility] providers in the past year (date may be approximate).     Assessment:   This is a routine  wellness examination for Nicolasa.  Hearing/Vision screen Hearing Screening - Comments:: No aids Vision Screening - Comments:: Read[facility]- [facility] in [place]   Goals Addressed             This Visit's Progress    Cut out extra servings         Depression Screen    05/23/2023    4:05 PM 04/06/2023    9:52 AM 07/28/2022    9:12 AM 05/17/2022   11:05 AM 02/16/2022   11:33 AM 08/09/2021    1:55 PM 03/25/2021   10:57 AM  PHQ 2/9 Scores  PHQ - 2 Score 0 0 0 0 2 1 1   PHQ- 9 Score 0 1 1 0 7 2 1     Fall Risk    05/23/2023    4:07 PM 07/28/2022    9:12 AM 05/17/2022   11:08 AM 05/15/2022    1:22 PM 04/20/2022    1:19 PM  Fall Risk   Falls in the past year? 0 0 0 0 0  Number falls in past yr: 0 0 0    Injury with Fall? 0 0 0    Risk for fall due to : No Fall Risks No Fall Risks No Fall Risks    Follow up Falls prevention discussed;Falls evaluation completed  Falls prevention discussed;Falls evaluation completed      MEDICARE RISK AT HOME: Medicare Risk at Home Any stairs in or around the home?: No If so, are there any without handrails?: No Home free of loose throw rugs in walkways, pet beds, electrical cords, etc?: Yes Adequate lighting in your home to reduce risk of falls?: Yes Life alert?: No Use of a cane, walker or w/c?: No Grab bars in the bathroom?: Yes Shower chair or bench in shower?: No Elevated toilet seat or a handicapped toilet?: No  TIMED UP AND GO:  Was the test performed?  No    Cognitive Function:        05/23/2023    4:13 PM 05/23/2023    4:09 PM 05/17/2022   11:09 AM 03/25/2021   10:50 AM 03/19/2020   10:12 AM  6CIT Screen  What Year? 0 points 0 points 0 points 0 points 0 points  What month? 0 points 0 points 0  points 0 points 0 points  What time? 0 points 0 points 0 points 0 points 0 points  Count back from 20 0 points 0 points 0 points 0 points 0 points  Months in reverse 0 points 0 points 0 points 0 points 2 points  Repeat phrase 0 points  0  points 0 points 0 points  Total Score 0 points  0 points 0 points 2 points    Immunizations Immunization History  Administered Date(s) Administered   Influenza-Unspecified 03/07/2017, 06/14/2018   MMR 09/09/1987   MODERNA SARS COV-2 Pediatric Vaccination 6mos to <39yrs 07/28/2020, 08/25/2020   PNEUMOCOCCAL CONJUGATE-20 05/19/2022   Tdap 09/30/2008, 01/02/2019   Zoster Recombinant(Shingrix) 06/27/2019    TDAP status: Up to date  Flu Vaccine status: Declined, Education has been provided regarding the importance of this vaccine but patient still declined. Advised may receive this vaccine at local pharmacy or Health Dept. Aware to provide a copy of the vaccination record if obtained from local pharmacy or Health Dept. Verbalized acceptance and understanding.  Pneumococcal vaccine status: Up to date  Covid-19 vaccine status: Completed vaccines  Qualifies for Shingles Vaccine? Yes   Zostavax completed Yes   Shingrix Completed?: No.    Education has been provided regarding the importance of this vaccine. Patient has been advised to call insurance company to determine out of pocket expense if they have not yet received this vaccine. Advised may also receive vaccine at local pharmacy or Health Dept. Verbalized acceptance and understanding.  Screening Tests Health Maintenance  Topic Date Due   Zoster Vaccines- Shingrix (2 of 2) 08/22/2019   COVID-19 Vaccine (1) 09/22/2020   INFLUENZA VACCINE  10/23/2023 (Originally 02/23/2023)   MAMMOGRAM  05/07/2024   Medicare Annual Wellness (AWV)  05/22/2024   Colonoscopy  02/04/2027   DTaP/Tdap/Td (3 - Td or Tdap) 01/01/2029   Pneumonia Vaccine 59+ Years old  Completed   DEXA SCAN  Completed   Hepatitis C Screening  Completed   HPV VACCINES  Aged Out  Had 1st Shingrix  Health Maintenance  Health Maintenance Due  Topic Date Due   Zoster Vaccines- Shingrix (2 of 2) 08/22/2019   COVID-19 Vaccine (1) 09/22/2020    Colorectal cancer screening:  Type of screening: Colonoscopy. Completed 02/04/20. Repeat every 7 years  Mammogram status: Completed 05/08/23. Repeat every year  Bone Density status: Completed 07/27/22. Results reflect: Bone density results: OSTEOPENIA. Repeat every 5 years.  Lung Cancer Screening: (Low Dose CT Chest recommended if Age 79-80 years, 20 pack-year currently smoking OR have quit w/in 15years.) does not qualify.     Additional Screening:  Hepatitis C Screening: does qualify; Completed 12/16/15  Vision Screening: Recommended annual ophthalmology exams for early detection of glaucoma and other disorders of the eye. Is the patient up to date with their annual eye exam?  Yes  Who is the provider or what is the name of the office in which the patient attends annual eye exams? [facility] in [place] If pt is not established with a provider, would they like to be referred to a provider to establish care? No .   Dental Screening: Recommended annual dental exams for proper oral hygiene   Community Resource Referral / Chronic Care Management: CRR required this visit?  No   CCM required this visit?  No     Plan:     I have personally reviewed and noted the following in the patient's chart:   Medical and social history Use of alcohol, tobacco or  illicit drugs  Current medications and supplements including opioid prescriptions. Patient is not currently taking opioid prescriptions. Functional ability and status Nutritional status Physical activity Advanced directives List of other physicians Hospitalizations, surgeries, and ER visits in previous 12 months Vitals Screenings to include cognitive, depression, and falls Referrals and appointments  In addition, I have reviewed and discussed with patient certain preventive protocols, quality metrics, and best practice recommendations. A written personalized care plan for preventive services as well as general preventive health recommendations were provided to  patient.     Hal Hope, LPN   16/04/[id number]   After Visit Summary: (MyChart) Due to this being a telephonic visit, the after visit summary with patients personalized plan was offered to patient via MyChart   Nurse Notes: none

## 2023-05-23 NOTE — Patient Instructions (Addendum)
[name] , Thank you for taking time to come for your Medicare Wellness Visit. I appreciate your ongoing commitment to your health goals. Please review the following plan we discussed and let me know if I can assist you in the future.   Referrals/Orders/Follow-Ups/Clinician Recommendations: none  This is a list of the screening recommended for you and due dates:  Health Maintenance  Topic Date Due   Zoster (Shingles) Vaccine (2 of 2) 08/22/2019   COVID-19 Vaccine (1) 09/22/2020   Flu Shot  10/23/2023*   Mammogram  05/07/2024   Medicare Annual Wellness Visit  05/22/2024   Colon Cancer Screening  02/04/2027   DTaP/Tdap/Td vaccine (3 - Td or Tdap) 01/01/2029   Pneumonia Vaccine  Completed   DEXA scan (bone density measurement)  Completed   Hepatitis C Screening  Completed   HPV Vaccine  Aged Out  *Topic was postponed. The date shown is not the original due date.    Advanced directives: (ACP Link)Information on Advanced Care Planning can be found at [facility] of [facility] Directives Advance Health Care Directives ([email or web address]   Next Medicare Annual Wellness Visit scheduled for next year: Yes    05/28/24 @ 3:30 pm by video

## 2023-05-23 NOTE — Telephone Encounter (Signed)
Patient was called by [facility]

## 2023-06-02 ENCOUNTER — Ambulatory Visit: Payer: 59 | Admitting: Obstetrics

## 2023-06-04 ENCOUNTER — Other Ambulatory Visit: Payer: Self-pay | Admitting: Family Medicine

## 2023-06-26 ENCOUNTER — Ambulatory Visit (INDEPENDENT_AMBULATORY_CARE_PROVIDER_SITE_OTHER): Payer: 59 | Admitting: Obstetrics

## 2023-06-26 ENCOUNTER — Encounter: Payer: Self-pay | Admitting: Obstetrics

## 2023-06-26 ENCOUNTER — Other Ambulatory Visit: Payer: Self-pay

## 2023-06-26 ENCOUNTER — Other Ambulatory Visit (HOSPITAL_COMMUNITY)
Admission: RE | Admit: 2023-06-26 | Discharge: 2023-06-26 | Disposition: A | Discharge status: Home / Self Care | Payer: 59 | Source: Ambulatory Visit | Attending: Family Medicine | Admitting: Family Medicine

## 2023-06-26 VITALS — BP 126/75 | HR 60 | Ht 64.57 in | Wt 164.2 lb

## 2023-06-26 DIAGNOSIS — R350 Frequency of micturition: Secondary | ICD-10-CM

## 2023-06-26 DIAGNOSIS — R319 Hematuria, unspecified: Secondary | ICD-10-CM | POA: Insufficient documentation

## 2023-06-26 DIAGNOSIS — R351 Nocturia: Secondary | ICD-10-CM

## 2023-06-26 DIAGNOSIS — Z87440 Personal history of urinary (tract) infections: Secondary | ICD-10-CM | POA: Diagnosis not present

## 2023-06-26 DIAGNOSIS — N8110 Cystocele, unspecified: Secondary | ICD-10-CM

## 2023-06-26 DIAGNOSIS — N393 Stress incontinence (female) (male): Secondary | ICD-10-CM | POA: Insufficient documentation

## 2023-06-26 DIAGNOSIS — R8290 Unspecified abnormal findings in urine: Secondary | ICD-10-CM | POA: Diagnosis not present

## 2023-06-26 LAB — POCT URINALYSIS DIPSTICK
Bilirubin, UA: NEGATIVE
Glucose, UA: NEGATIVE
Ketones, UA: NEGATIVE
Leukocytes, UA: NEGATIVE
Nitrite, UA: NEGATIVE
Protein, UA: NEGATIVE
Spec Grav, UA: 1.025 (ref 1.010–1.025)
Urobilinogen, UA: 0.2 U/dL — AB
pH, UA: 6.5 (ref 5.0–8.0)

## 2023-06-26 LAB — URINALYSIS, ROUTINE W REFLEX MICROSCOPIC
Bilirubin Urine: NEGATIVE
Glucose, UA: NEGATIVE mg/dL
Hgb urine dipstick: NEGATIVE
Ketones, ur: NEGATIVE mg/dL
Leukocytes,Ua: NEGATIVE
Nitrite: NEGATIVE
Protein, ur: NEGATIVE mg/dL
Specific Gravity, Urine: 1.023 (ref 1.005–1.030)
pH: 6 (ref 5.0–8.0)

## 2023-06-26 MED ORDER — ESTROGENS CONJUGATED 0.625 MG/GM VA CREA
1.0000 | TOPICAL_CREAM | VAGINAL | 2 refills | Status: DC
Start: 2023-06-26 — End: 2024-05-29

## 2023-06-26 MED ORDER — ESTROGENS CONJUGATED 0.625 MG/GM VA CREA
1.0000 | TOPICAL_CREAM | VAGINAL | 0 refills | Status: DC
Start: 2023-06-26 — End: 2023-11-14

## 2023-06-26 NOTE — Patient Instructions (Addendum)
For treatment of recurrent urinary tract infections, we discussed management of recurrent UTIs including prophylaxis with a daily low dose antibiotic, transvaginal estrogen therapy, D-mannose, and cranberry supplements.  We discussed the role of diagnostic testing such as cystoscopy and upper tract imaging.     For vaginal atrophy (thinning of the vaginal tissue that can cause dryness and burning) and UTI prevention we discussed estrogen replacement in the form of vaginal cream.   Start vaginal estrogen therapy nightly for two weeks then 2 times weekly at night. This can be placed with your finger or an applicator inside the vagina and around the urethra.  Please let us know if the prescription is too expensive and we can look for alternative options.   Is vaginal estrogen therapy safe for me? Vaginal estrogen preparations act on the vaginal skin, and only a very tiny amount is absorbed into the bloodstream (0.01%).  They work in a similar way to hand or face cream.  There is minimal absorption and they are therefore perfectly safe. If you have had breast cancer and have persistent troublesome symptoms which aren't settling with vaginal moisturisers and lubricants, local estrogen treatment may be a possibility, but consultation with your oncologist should take place first.   For night time frequency: - avoid fluid intake after 6pm and reduce pepsi intake  You have a stage 2 (out of 4) prolapse.  We discussed the fact that it is not life threatening but there are several treatment options. For treatment of pelvic organ prolapse, we discussed options for management including expectant management, conservative management, and surgical management, such as Kegels, a pessary, pelvic floor physical therapy, and specific surgical procedures.

## 2023-06-26 NOTE — Assessment & Plan Note (Addendum)
-   one positive urine culture on 04/20/23 with 24-50K pansensitive E. Coli.  - reviewed definition of rUTI with 2 symptomatic culture proven UTI in 6 months or 3 in 1 year. - For treatment of recurrent urinary tract infections, we discussed management of recurrent UTIs including prophylaxis with a daily low dose antibiotic, transvaginal estrogen therapy, D-mannose, and cranberry supplements.  We discussed the role of diagnostic testing such as cystoscopy and upper tract imaging.   - samples and Rx premarin to start vaginal estrogen - encouraged to start probiotics - encouraged avoidance of antibiotic use due to risk of antibiotic resistant uropathogens

## 2023-06-26 NOTE — Progress Notes (Signed)
New Patient Evaluation and Consultation  Referring Provider: Jacky Kindle, FNP PCP: Jacky Kindle, FNP Date of Service: 06/26/2023  SUBJECTIVE Chief Complaint: New Patient (Initial Visit) [name] is a 67 y.o. female here today for chronic UTIs.)  History of Present Illness: [name] is a 67 y.o. Black or African-American female seen in consultation at the request of NP Suzie Portela for evaluation of prolapse and recurrent UTI.    Evaluation on 04/20/23 with urinary frequency, bladder pressure, urgency, sensation of incomplete emptying, and diarrhea with no relief after cranberry juice and Azo. UA + heme/protein/leuk. Rx macrobid with culture 24-50K pansensitive E. Coli. Symptoms resolved after antibiotics Office evaluation 04/26/22 with dysuria, urinary frequency/urgency and pelvic pressure after yeast infection treated with diflucan following antibiotics for URI. Tried Azo without relief. Rx Keflex.  HbA1C 6.3 on 04/06/23  Review of records significant for: Pre-DM on metformin  Urinary Symptoms: Does not leak urine.  However reports urgency leakage 1x/month when she drinks Pepsi  Day time voids 3.  Nocturia: 3 times per night to void when she drinks sodas, 1-2x/night when she does not drink pepsi and difficulty sleeping Voiding dysfunction:  empties bladder well.  Patient does not use a catheter to empty bladder.  When urinating, patient feels she has no difficulties Drinks: 32oz per day, 16oz x 3 Pepsi  UTIs: 3 UTI's in the last year.   Denies history of blood in urine, bladder stones, pyelonephritis, bladder cancer, and kidney cancer. Reports possible kidney stone history that passed spontaneously around 1 year ago, denies evaluation however reported dysuria with suprapubic abdominal pain that resolved spontaneously after 1 week.    Pelvic Organ Prolapse Symptoms:                  Patient Denies a feeling of a bulge the vaginal area. Patient Denies seeing a bulge.    Bowel Symptom: Bowel movements: 2 time(s) per day Stool consistency: soft  Straining: no.  Splinting: no.  Incomplete evacuation: no.  Patient Denies accidental bowel leakage / fecal incontinence Bowel regimen: none Last colonoscopy: Results - One 4 mm polyp in the descending colon, removed with a cold snare. Resected and retrieved. - Non- bleeding internal hemorrhoids. HM Colonoscopy          Colonoscopy (Every 7 Years) Next due on 02/04/2027    02/04/2020  Surgical Procedure: COLONOSCOPY WITH PROPOFOL   Only the first 1 history entries have been loaded, but more history exists.            Sexual Function Sexually active: yes.  Sexual orientation: Straight Pain with sex: No  Pelvic Pain Denies pelvic pain   Past Medical History:  Past Medical History:  Diagnosis Date   Allergy    Anemia    takes Ferrous Sulfate daily   Arthritis    GERD (gastroesophageal reflux disease)    takes OTC med   Heart murmur    Hyperlipidemia    Hypertension    takes Amlodipine daily   Low back pain    occasionally     Past Surgical History:   Past Surgical History:  Procedure Laterality Date   ANTERIOR CERVICAL DECOMP/DISCECTOMY FUSION  10/19/2011   Procedure: ANTERIOR CERVICAL DECOMPRESSION/DISCECTOMY FUSION 1 LEVEL/HARDWARE REMOVAL;  Surgeon: Mariam Dollar, MD;  Location: [facility];  Service: Neurosurgery;  Laterality: N/A;  Cervical four - five  Anterior cervical decompression fusion with redo at six - seven  Rm 33   CARPAL TUNNEL  RELEASE     COLONOSCOPY  08/2014   polyps   COLONOSCOPY WITH PROPOFOL N/A 02/04/2020   Procedure: COLONOSCOPY WITH PROPOFOL;  Surgeon: Midge Minium, MD;  Location: [facility] END[facility];  Service: Endoscopy;  Laterality: N/A;   ESOPHAGOGASTRODUODENOSCOPY (EGD) WITH PROPOFOL N/A 06/23/2015   Procedure: ESOPHAGOGASTRODUODENOSCOPY (EGD) WITH PROPOFOL;  Surgeon: Midge Minium, MD;  Location: [facility] ENDOSCOPY;  Service: Endoscopy;  Laterality: N/A;   LIPOMA  EXCISION Left 03/29/2022   Procedure: EXCISION LIPOMA, left upper extremity;  Surgeon: Henrene Dodge, MD;  Location: [facility] ORS;  Service: General;  Laterality: Left;   NECK SURGERY     POSTERIOR CERVICAL FUSION/FORAMINOTOMY N/A 08/26/2013   Procedure: C/4-5,C/5-6,C/6-7 Posterior Cervical Fusion w/lateral mass fixation;  Surgeon: Mariam Dollar, MD;  Location: [facility];  Service: Neurosurgery;  Laterality: N/A;   TUBAL LIGATION       Past OB/GYN History: OB History  Gravida Para Term Preterm AB Living  4 4 3     4   SAB IAB Ectopic Multiple Live Births          4    # Outcome Date GA Lbr Len/2nd Weight Sex Type Anes PTL Lv  4 Term      Vag-Spont   LIV  3 Term      Vag-Spont   LIV  2 Para      Vag-Spont   LIV  1 Term      Vag-Spont   LIV    Vaginal deliveries: 4, Largest infant 7lbs Forceps/ Vacuum deliveries: 0, Cesarean section: 0 Menopausal: Yes, at age 89, Denies vaginal bleeding since menopause Contraception: n/a. Last pap smear was 12/10/19.  Any history of abnormal pap smears: no. No results found for: "DIAGPAP", "HPVHIGH", "ADEQPAP"  Medications: Patient has a current medication list which includes the following prescription(s): albuterol, amlodipine, atorvastatin, calcium, cholecalciferol, conjugated estrogens, conjugated estrogens, cyclobenzaprine, fish oil-cholecalciferol, fluticasone, metformin, montelukast, pantoprazole, and simethicone.   Allergies: Patient is allergic to aspirin, hydrochlorothiazide, ibuprofen, lisinopril, and sulfa antibiotics.   Social History:  Social History   Tobacco Use   Smoking status: Never   Smokeless tobacco: Never  Vaping Use   Vaping status: Never Used  Substance Use Topics   Alcohol use: No   Drug use: No    Relationship status: long-term partner Patient lives by herself.   Patient is not employed. Regular exercise: Yes: walking History of abuse: No  Family History:   Family History  Problem Relation Age of Onset    Hypertension Mother    Heart disease Mother    Kidney failure Sister    Breast cancer Neg Hx      Review of Systems: Review of Systems  Constitutional:  Negative for fever, malaise/fatigue and weight loss.  Respiratory:  Negative for cough, shortness of breath and wheezing.   Cardiovascular:  Negative for chest pain, palpitations and leg swelling.  Gastrointestinal:  Negative for abdominal pain and blood in stool.  Genitourinary:  Positive for frequency (night time). Negative for dysuria, hematuria and urgency.  Skin:  Negative for rash.  Neurological:  Negative for dizziness, weakness and headaches.  Endo/Heme/Allergies:  Bruises/bleeds easily.  Psychiatric/Behavioral:  Negative for depression. The patient is not nervous/anxious.      OBJECTIVE Physical Exam: Vitals:   06/26/23 0928  BP: 126/75  Pulse: 60  Weight: 164 lb 3.2 oz (74.5 kg)  Height: 5' 4.57" (1.64 m)    Physical Exam Constitutional:      General: She is not in acute distress.  Appearance: Normal appearance.  Genitourinary:     Bladder and urethral meatus normal.     No lesions in the vagina.     Right Labia: No rash, tenderness, lesions, skin changes or Bartholin's cyst.    Left Labia: No tenderness, lesions, skin changes, Bartholin's cyst or rash.    No vaginal discharge, erythema, tenderness, bleeding, ulceration or granulation tissue.     Anterior vaginal prolapse present.     Right Adnexa: not tender, not full and no mass present.    Left Adnexa: not tender, not full and no mass present.    No cervical motion tenderness, discharge, friability, lesion, polyp or nabothian cyst.     Uterus is not enlarged, fixed, tender, irregular or prolapsed.     No uterine mass detected.    Urethral meatus caruncle not present.    Urethral stress urinary incontinence with cough stress test present.     No urethral prolapse, tenderness, mass, hypermobility or discharge present.     Bladder is not tender, urgency  on palpation not present and masses not present.      Pelvic Floor: Levator muscle strength is 5/5.    Levator ani not tender, obturator internus not tender, no asymmetrical contractions present and no pelvic spasms present.    Anal wink present and BC reflex present. Cardiovascular:     Rate and Rhythm: Normal rate.  Pulmonary:     Effort: Pulmonary effort is normal. No respiratory distress.  Abdominal:     General: There is no distension.     Palpations: There is no mass.     Tenderness: There is no abdominal tenderness.     Hernia: No hernia is present.    Neurological:     Mental Status: She is alert.  Vitals reviewed. Exam conducted with a chaperone present.      POP-Q:   POP-Q  -1                                            Aa   -1                                           Ba  -6                                              C   2                                            Gh  3                                            Pb  8  tvl   -2                                            Ap  -2                                            Bp  -7                                              D    Post-Void Residual (PVR) by Bladder Scan: In order to evaluate bladder emptying, we discussed obtaining a postvoid residual and patient agreed to this procedure.  Procedure: The ultrasound unit was placed on the patient's abdomen in the suprapubic region after the patient had voided.    Post Void Residual - 06/26/23 0935       Post Void Residual   Post Void Residual 24 mL              Laboratory Results: Lab Results  Component Value Date   COLORU Dark yellow 06/26/2023   CLARITYU Clear 06/26/2023   GLUCOSEUR Negative 06/26/2023   BILIRUBINUR Negative 06/26/2023   KETONESU Negative 06/26/2023   SPECGRAV 1.025 06/26/2023   RBCUR Trace 06/26/2023   PHUR 6.5 06/26/2023   PROTEINUR Negative 06/26/2023   UROBILINOGEN 0.2  (A) 06/26/2023   LEUKOCYTESUR Negative 06/26/2023    Lab Results  Component Value Date   CREATININE 0.73 04/06/2023   CREATININE 0.62 08/02/2022   CREATININE 0.60 07/28/2022    Lab Results  Component Value Date   HGBA1C 6.3 (H) 04/06/2023    Lab Results  Component Value Date   HGB 13.7 04/06/2023     ASSESSMENT AND PLAN Ms. Coaker is a 67 y.o. with:  1. Nocturia   2. History of recurrent UTI (urinary tract infection)   3. Pelvic organ prolapse quantification stage 2 cystocele   4. Abnormal urinalysis   5. SUI (stress urinary incontinence, female)     Nocturia Assessment & Plan: - reduces with caffeine restriction, encouraged to cutdown - avoid fluid intake after 6pm -We discussed the symptoms of overactive bladder (OAB), which include urinary urgency, urinary frequency, nocturia, with or without urge incontinence.  While we do not know the exact etiology of OAB, several treatment options exist. We discussed management including behavioral therapy (decreasing bladder irritants, urge suppression strategies, timed voids, bladder retraining), physical therapy, medication; for refractory cases posterior tibial nerve stimulation, sacral neuromodulation, and intravesical botulinum toxin injection.  For anticholinergic medications, we discussed the potential side effects of anticholinergics including dry eyes, dry mouth, constipation, cognitive impairment and urinary retention. For Beta-3 agonist medication, we discussed the potential side effect of elevated blood pressure which is more likely to occur in individuals with uncontrolled hypertension. - will reassess after bladder training, Kegel exercises and caffeine reduction  Orders: -     POCT urinalysis dipstick  History of recurrent UTI (urinary tract infection) Assessment & Plan: - one positive urine culture on 04/20/23 with 24-50K pansensitive E. Coli.  - reviewed definition of rUTI with 2 symptomatic culture proven UTI  in 6 months or 3 in 1 year. -  For treatment of recurrent urinary tract infections, we discussed management of recurrent UTIs including prophylaxis with a daily low dose antibiotic, transvaginal estrogen therapy, D-mannose, and cranberry supplements.  We discussed the role of diagnostic testing such as cystoscopy and upper tract imaging.   - samples and Rx premarin to start vaginal estrogen - encouraged to start probiotics - encouraged avoidance of antibiotic use due to risk of antibiotic resistant uropathogens  Orders: -     Estrogens Conjugated; Place 1 Applicatorful vaginally 2 (two) times a week. Place 0.5g nightly for two weeks then twice a week after  Dispense: 4 g; Refill: 0 -     Estrogens Conjugated; Place 1 Applicatorful vaginally 2 (two) times a week. Place 0.5g nightly for two weeks then twice a week after  Dispense: 30 g; Refill: 2  Pelvic organ prolapse quantification stage 2 cystocele Assessment & Plan: - asymptomatic  - For treatment of pelvic organ prolapse, we discussed options for management including expectant management, conservative management, and surgical management, such as Kegels, a pessary, pelvic floor physical therapy, and specific surgical procedures.    Abnormal urinalysis Assessment & Plan: - POCT UA + trace heme - pt denies blood in urine - pending UA microscopy, repeat with catheterized sample if positive - For workup of microscopic hematuria, will assess the upper and lower GU tract with CT urogram and cystoscopy if abnormal    Orders: -     Urine Microscopic; Future  SUI (stress urinary incontinence, female) Assessment & Plan: - minimal bother per patient, + leakage with valsalva, bladder scan 24mL - For treatment of stress urinary incontinence,  non-surgical options include expectant management, weight loss, physical therapy, as well as a pessary.  Surgical options include a midurethral sling, Burch urethropexy, and transurethral injection of a  bulking agent. - expectant mgmt at this time     Time spent: I spent 53 minutes dedicated to the care of this patient on the date of this encounter to include pre-visit review of records, face-to-face time with the patient discussing recurrent UTI, pelvic organ prolapse, abnormal urinalysis, nocturia, and stress urinary incontinence, and post visit documentation and ordering medication/ testing.    Loleta Chance, MD

## 2023-06-26 NOTE — Assessment & Plan Note (Signed)
-   POCT UA + trace heme - pt denies blood in urine - pending UA microscopy, repeat with catheterized sample if positive - For workup of microscopic hematuria, will assess the upper and lower GU tract with CT urogram and cystoscopy if abnormal

## 2023-06-26 NOTE — Assessment & Plan Note (Addendum)
-   reduces with caffeine restriction, encouraged to cutdown - avoid fluid intake after 6pm -We discussed the symptoms of overactive bladder (OAB), which include urinary urgency, urinary frequency, nocturia, with or without urge incontinence.  While we do not know the exact etiology of OAB, several treatment options exist. We discussed management including behavioral therapy (decreasing bladder irritants, urge suppression strategies, timed voids, bladder retraining), physical therapy, medication; for refractory cases posterior tibial nerve stimulation, sacral neuromodulation, and intravesical botulinum toxin injection.  For anticholinergic medications, we discussed the potential side effects of anticholinergics including dry eyes, dry mouth, constipation, cognitive impairment and urinary retention. For Beta-3 agonist medication, we discussed the potential side effect of elevated blood pressure which is more likely to occur in individuals with uncontrolled hypertension. - will reassess after bladder training, Kegel exercises and caffeine reduction

## 2023-06-26 NOTE — Addendum Note (Signed)
Addended by: Salina April on: 06/26/2023 03:34 PM   Modules accepted: Orders

## 2023-06-26 NOTE — Assessment & Plan Note (Signed)
-   asymptomatic  - For treatment of pelvic organ prolapse, we discussed options for management including expectant management, conservative management, and surgical management, such as Kegels, a pessary, pelvic floor physical therapy, and specific surgical procedures.

## 2023-06-26 NOTE — Assessment & Plan Note (Addendum)
-   minimal bother per patient, + leakage with valsalva, bladder scan 24mL - For treatment of stress urinary incontinence,  non-surgical options include expectant management, weight loss, physical therapy, as well as a pessary.  Surgical options include a midurethral sling, Burch urethropexy, and transurethral injection of a bulking agent. - expectant mgmt at this time

## 2023-07-06 ENCOUNTER — Other Ambulatory Visit: Payer: Self-pay | Admitting: Family Medicine

## 2023-07-06 ENCOUNTER — Ambulatory Visit (INDEPENDENT_AMBULATORY_CARE_PROVIDER_SITE_OTHER): Payer: 59 | Admitting: Family Medicine

## 2023-07-06 VITALS — BP 134/90 | Ht 64.75 in | Wt 168.0 lb

## 2023-07-06 DIAGNOSIS — I10 Essential (primary) hypertension: Secondary | ICD-10-CM

## 2023-07-06 DIAGNOSIS — R7303 Prediabetes: Secondary | ICD-10-CM

## 2023-07-06 NOTE — Assessment & Plan Note (Signed)
Chronic, with increase from 10/23 to 9/24 Continue to recommend balanced, lower carb meals. Smaller meal size, adding snacks. Choosing water as drink of choice and increasing purposeful exercise. Repeat A1c Continues on metformin BID at 750 mg xr Has made dietary changes

## 2023-07-06 NOTE — Assessment & Plan Note (Signed)
Chronic, with borderline elevation today Encouraged to continue to follow low fat, low sodium diet and continue regular exercise to assist Continue daily Norvasc 10 mg  Consider start of coreg BID to further assist

## 2023-07-06 NOTE — Progress Notes (Signed)
Established patient visit   Patient: [name]   DOB: [birth year]   67 y.o. Female  MRN: [id number] Visit Date: 07/06/2023  Today's healthcare provider: Jacky Kindle, FNP  Introduced to nurse practitioner role and practice setting.  All questions answered.  Discussed provider/patient relationship and expectations.  Subjective    HPI   Presents for follow up on pre-diabetes. Reports working on eating less sugar to assist with disease process.  Medications: Outpatient Medications Prior to Visit  Medication Sig   albuterol (VENTOLIN HFA) 108 (90 Base) MCG/ACT inhaler Inhale 2 puffs into the lungs every 4 (four) hours as needed for wheezing or shortness of breath.   amLODipine (NORVASC) 10 MG tablet Take 1 tablet (10 mg total) by mouth daily. TAKE 1 TABLET BY MOUTH DAILY   atorvastatin (LIPITOR) 40 MG tablet TAKE 1 TABLET BY MOUTH DAILY   CALCIUM PO    Cholecalciferol (VITAMIN D-3 PO)    conjugated estrogens (PREMARIN) vaginal cream Place 1 Applicatorful vaginally 2 (two) times a week. Place 0.5g nightly for two weeks then twice a week after   conjugated estrogens (PREMARIN) vaginal cream Place 1 Applicatorful vaginally 2 (two) times a week. Place 0.5g nightly for two weeks then twice a week after   cyclobenzaprine (FLEXERIL) 5 MG tablet Take 1 tablet (5 mg total) by mouth at bedtime.   Fish Oil-Cholecalciferol (FISH OIL + D3 PO)    fluticasone (FLONASE) 50 MCG/ACT nasal spray Place 2 sprays into both nostrils daily.   metFORMIN (GLUCOPHAGE-XR) 750 MG 24 hr tablet Take 1 tablet (750 mg total) by mouth 2 (two) times daily before a meal.   montelukast (SINGULAIR) 10 MG tablet Take 1 tablet (10 mg total) by mouth at bedtime.   pantoprazole (PROTONIX) 40 MG tablet TAKE 1 TABLET BY MOUTH DAILY   Simethicone 125 MG CAPS    No facility-administered medications prior to visit.    Review of Systems Last hemoglobin A1c Lab Results  Component Value Date   HGBA1C 6.3 (H)  04/06/2023     Objective    BP (!) 134/90 (BP Location: Left Arm, Patient Position: Sitting, Cuff Size: Normal)   Ht 5' 4.75" (1.645 m)   Wt 168 lb (76.2 kg)   SpO2 97%   BMI 28.17 kg/m   Physical Exam Vitals and nursing note reviewed.  Constitutional:      General: She is not in acute distress.    Appearance: Normal appearance. She is overweight. She is not ill-appearing, toxic-appearing or diaphoretic.  HENT:     Head: Normocephalic and atraumatic.  Cardiovascular:     Rate and Rhythm: Normal rate and regular rhythm.     Pulses: Normal pulses.     Heart sounds: Normal heart sounds. No murmur heard.    No friction rub. No gallop.  Pulmonary:     Effort: Pulmonary effort is normal. No respiratory distress.     Breath sounds: Normal breath sounds. No stridor. No wheezing, rhonchi or rales.  Chest:     Chest wall: No tenderness.  Musculoskeletal:        General: No swelling, tenderness, deformity or signs of injury. Normal range of motion.     Right lower leg: No edema.     Left lower leg: No edema.  Skin:    General: Skin is warm and dry.     Capillary Refill: Capillary refill takes less than 2 seconds.     Coloration: Skin is not jaundiced or  pale.     Findings: No bruising, erythema, lesion or rash.  Neurological:     General: No focal deficit present.     Mental Status: She is alert and oriented to person, place, and time. Mental status is at baseline.     Cranial Nerves: No cranial nerve deficit.     Sensory: No sensory deficit.     Motor: No weakness.     Coordination: Coordination normal.  Psychiatric:        Mood and Affect: Mood normal.        Behavior: Behavior normal.        Thought Content: Thought content normal.        Judgment: Judgment normal.     No results found for any visits on 07/06/23.  Assessment & Plan     Problem List Items Addressed This Visit       Cardiovascular and Mediastinum   Primary hypertension - Primary   Chronic, with  borderline elevation today Encouraged to continue to follow low fat, low sodium diet and continue regular exercise to assist Continue daily Norvasc 10 mg  Consider start of coreg BID to further assist        Other   Prediabetes   Chronic, with increase from 10/23 to 9/24 Continue to recommend balanced, lower carb meals. Smaller meal size, adding snacks. Choosing water as drink of choice and increasing purposeful exercise. Repeat A1c Continues on metformin BID at 750 mg xr Has made dietary changes      Relevant Orders   Hemoglobin A1c   Return in about 3 months (around 10/04/2023) for T2DM management.     Leilani Merl, FNP, have reviewed all documentation for this visit. The documentation on 07/06/23 for the exam, diagnosis, procedures, and orders are all accurate and complete.  Jacky Kindle, FNP  [facility] [phone] (phone) [phone] (fax)  [facility]

## 2023-07-07 LAB — HEMOGLOBIN A1C
Est. average glucose Bld gHb Est-mCnc: 131 mg/dL
Hgb A1c MFr Bld: 6.2 % — ABNORMAL HIGH (ref 4.8–5.6)

## 2023-07-09 ENCOUNTER — Other Ambulatory Visit: Payer: Self-pay | Admitting: Family Medicine

## 2023-07-11 NOTE — Telephone Encounter (Signed)
Requested medication (s) are due for refill today:   Requested medication (s) are on the active medication list: No  Last refill:  ?  Future visit scheduled: Yes  Notes to clinic:  Not on medication list.    Requested Prescriptions  Pending Prescriptions Disp Refills   clopidogrel (PLAVIX) 75 MG tablet [Pharmacy Med Name: Clopidogrel Bisulfate 75 MG Oral Tablet] 80 tablet 3    Sig: TAKE 1 TABLET BY MOUTH DAILY     Hematology: Antiplatelets - clopidogrel Passed - 07/11/2023  8:23 AM      Passed - HCT in normal range and within 180 days    Hematocrit  Date Value Ref Range Status  04/06/2023 43.6 34.0 - 46.6 % Final         Passed - HGB in normal range and within 180 days    Hemoglobin  Date Value Ref Range Status  04/06/2023 13.7 11.1 - 15.9 g/dL Final         Passed - PLT in normal range and within 180 days    Platelets  Date Value Ref Range Status  04/06/2023 304 150 - 450 x10E3/uL Final         Passed - Cr in normal range and within 360 days    Creatinine  Date Value Ref Range Status  04/19/2012 0.71 0.60 - 1.30 mg/dL Final   Creatinine, Ser  Date Value Ref Range Status  04/06/2023 0.73 0.57 - 1.00 mg/dL Final         Passed - Valid encounter within last 6 months    Recent Outpatient Visits           5 days ago Primary hypertension   [facility] Jacky Kindle, FNP   2 months ago Suspected UTI   [facility] Jacky Kindle, FNP   3 months ago Primary hypertension   [facility] Jacky Kindle, FNP   11 months ago Hepatic cyst   [facility] Jacky Kindle, FNP   11 months ago History of COVID-19   [facility] Jacky Kindle, [place]

## 2023-07-11 NOTE — Telephone Encounter (Signed)
Medication is not on the list. Please advise

## 2023-08-09 ENCOUNTER — Encounter: Payer: Self-pay | Admitting: Family Medicine

## 2023-08-09 ENCOUNTER — Ambulatory Visit (INDEPENDENT_AMBULATORY_CARE_PROVIDER_SITE_OTHER): Payer: 59 | Admitting: Family Medicine

## 2023-08-09 VITALS — BP 127/65 | HR 71 | Temp 98.2°F | Ht 64.75 in | Wt 162.0 lb

## 2023-08-09 DIAGNOSIS — R051 Acute cough: Secondary | ICD-10-CM

## 2023-08-09 DIAGNOSIS — H1013 Acute atopic conjunctivitis, bilateral: Secondary | ICD-10-CM | POA: Diagnosis not present

## 2023-08-09 DIAGNOSIS — Z77098 Contact with and (suspected) exposure to other hazardous, chiefly nonmedicinal, chemicals: Secondary | ICD-10-CM

## 2023-08-09 MED ORDER — LORATADINE 10 MG PO TABS: 10.0000 mg | ORAL_TABLET | Freq: Every day | ORAL | 0 refills | Status: DC

## 2023-08-09 MED ORDER — CARBOXYMETHYLCELLUL-GLYCERIN 0.5-0.9 % OP SOLN
1.0000 [drp] | Freq: Four times a day (QID) | OPHTHALMIC | 1 refills | Status: DC | PRN
Start: 2023-08-09 — End: 2023-09-07

## 2023-08-09 MED ORDER — AZELASTINE HCL 0.05 % OP SOLN: 1.0000 [drp] | Freq: Two times a day (BID) | OPHTHALMIC | 12 refills | Status: AC

## 2023-08-09 NOTE — Progress Notes (Signed)
Established patient visit   Patient: [name]   DOB: [birth year]   68 y.o. Female  MRN: [id number] Visit Date: 08/09/2023  Today's healthcare provider: Sherlyn Hay, DO   Chief Complaint  Patient presents with   Itchy Eye   Cough    Started this weekend, been drinking herbal tea   Subjective    Cough Associated symptoms include eye redness and a sore throat (irritated). Pertinent negatives include no chest pain, chills, ear pain, fever, headaches, postnasal drip, rhinorrhea or shortness of breath.   Miss Friede, a patient with a history of hypertension and treatment with plavix, presents with a cough that started over the weekend. The patient has been managing the cough with herbal tea. Concurrently, she has been experiencing itchy, red, and irritated eyes, which have been producing a hard, crusty discharge, particularly upon waking. The patient has been using an over-the-counter allergy eye drop, but reports that it seems to exacerbate the redness and irritation.  The patient also reports an irritated throat, which she describes as not sore, but rather a sensation of irritation. She has been managing this with hot tea. Additionally, she notes some irritation in one ear, but denies any pain.  The patient recalls cleaning her oven with a homemade solution of water, dish detergent, lemon juice, and vinegar the Monday one week prior. She reports that the onset of her symptoms occurred the following evening. Despite feeling unwell, the patient has been able to maintain her usual activities and appetite.  The patient has been taking Tylenol for symptom management, as she is unable to take ibuprofen due to her blood thinner medication. She has also been using her prescribed Flonase and inhaler for breathing. The patient denies any changes in vision or any worsening of her reflux symptoms. She also denies any fever, chills, or productive cough, but notes that the cough is more  bothersome at certain times of the day and seems to improve when she goes outside.     Medications: Outpatient Medications Prior to Visit  Medication Sig   albuterol (VENTOLIN HFA) 108 (90 Base) MCG/ACT inhaler Inhale 2 puffs into the lungs every 4 (four) hours as needed for wheezing or shortness of breath.   amLODipine (NORVASC) 10 MG tablet Take 1 tablet (10 mg total) by mouth daily. TAKE 1 TABLET BY MOUTH DAILY   atorvastatin (LIPITOR) 40 MG tablet TAKE 1 TABLET BY MOUTH DAILY   CALCIUM PO    Cholecalciferol (VITAMIN D-3 PO)    clopidogrel (PLAVIX) 75 MG tablet TAKE 1 TABLET BY MOUTH DAILY   conjugated estrogens (PREMARIN) vaginal cream Place 1 Applicatorful vaginally 2 (two) times a week. Place 0.5g nightly for two weeks then twice a week after   conjugated estrogens (PREMARIN) vaginal cream Place 1 Applicatorful vaginally 2 (two) times a week. Place 0.5g nightly for two weeks then twice a week after   cyclobenzaprine (FLEXERIL) 5 MG tablet Take 1 tablet (5 mg total) by mouth at bedtime.   Fish Oil-Cholecalciferol (FISH OIL + D3 PO)    fluticasone (FLONASE) 50 MCG/ACT nasal spray Place 2 sprays into both nostrils daily.   metFORMIN (GLUCOPHAGE-XR) 750 MG 24 hr tablet Take 1 tablet (750 mg total) by mouth 2 (two) times daily before a meal.   montelukast (SINGULAIR) 10 MG tablet Take 1 tablet (10 mg total) by mouth at bedtime.   pantoprazole (PROTONIX) 40 MG tablet TAKE 1 TABLET BY MOUTH DAILY   Simethicone 125 MG  CAPS    No facility-administered medications prior to visit.    Review of Systems  Constitutional:  Negative for chills, fatigue and fever.  HENT:  Positive for sore throat (irritated). Negative for congestion, ear pain, hearing loss, postnasal drip, rhinorrhea, sinus pressure, sinus pain and trouble swallowing.   Eyes:  Positive for discharge (crusting in the morning), redness and itching. Negative for photophobia, pain and visual disturbance.  Respiratory:  Positive for  cough. Negative for shortness of breath.   Cardiovascular:  Negative for chest pain.  Neurological:  Negative for headaches.        Objective    BP 127/65   Pulse 71   Temp 98.2 F (36.8 C)   Ht 5' 4.75" (1.645 m)   Wt 162 lb (73.5 kg)   BMI 27.17 kg/m     Physical Exam Constitutional:      Appearance: Normal appearance.  HENT:     Head: Normocephalic and atraumatic.     Right Ear: Hearing, ear canal and external ear normal. A middle ear effusion (clear) is present. There is no impacted cerumen. No hemotympanum. Tympanic membrane is bulging (mild). Tympanic membrane is not injected, scarred, perforated, erythematous or retracted.     Left Ear: Hearing, tympanic membrane, ear canal and external ear normal.  No middle ear effusion. There is no impacted cerumen. No hemotympanum. Tympanic membrane is not injected, scarred, perforated, erythematous, retracted or bulging.     Nose: No congestion or rhinorrhea.     Mouth/Throat:     Mouth: Mucous membranes are moist.     Pharynx: Oropharynx is clear. No oropharyngeal exudate or posterior oropharyngeal erythema.  Eyes:     General: Vision grossly intact. Gaze aligned appropriately. No allergic shiner, visual field deficit or scleral icterus.       Right eye: No foreign body, discharge or hordeolum.        Left eye: No foreign body, discharge or hordeolum.     Extraocular Movements: Extraocular movements intact.     Conjunctiva/sclera:     Right eye: Right conjunctiva is injected (mild). No chemosis, exudate or hemorrhage.    Left eye: Left conjunctiva is injected (mild). No chemosis, exudate or hemorrhage. Cardiovascular:     Rate and Rhythm: Normal rate and regular rhythm.     Pulses: Normal pulses.     Heart sounds: Normal heart sounds.  Pulmonary:     Effort: Pulmonary effort is normal. No respiratory distress.     Breath sounds: Normal breath sounds.  Musculoskeletal:     Right lower leg: No edema.     Left lower leg: No  edema.  Skin:    General: Skin is warm and dry.  Neurological:     Mental Status: She is alert and oriented to person, place, and time. Mental status is at baseline.  Psychiatric:        Mood and Affect: Mood normal.        Behavior: Behavior normal.      No results found for any visits on 08/09/23.  Assessment & Plan    Allergic conjunctivitis of both eyes -     Azelastine HCl; Place 1 drop into both eyes 2 (two) times daily. For up to 8 weeks  Dispense: 6 mL; Refill: 12 -     Loratadine; Take 1 tablet (10 mg total) by mouth daily.  Dispense: 30 tablet; Refill: 0 -     Carboxymethylcellul-Glycerin; Place 1 drop into both eyes 4 (four) times daily  as needed for dry eyes. Apply five minutes or more apart from other eye drops.  Dispense: 15 mL; Refill: 1  Acute cough  Exposure to chemical irritant -     Carboxymethylcellul-Glycerin; Place 1 drop into both eyes 4 (four) times daily as needed for dry eyes. Apply five minutes or more apart from other eye drops.  Dispense: 15 mL; Refill: 1   Allergic conjunctivitis of both eyes Exposure to chemical irritant Likely due to exposure to a homemade cleaning solution containing water, dish detergent, lemon juice, and vinegar. Symptoms include itchy, red, watery eyes with crusting, and an irritated throat. No significant changes in vision. No signs of infection in the eyes or throat. Eardrum slightly swollen but no signs of infection. Claritin and azelastine expected to reduce symptoms within a few days. - Prescribe azelastine eye drops - Prescribe Claritin (loratadine) for one month - Advise use of humidifier - Continue using albuterol inhaler and Flonase - Monitor ear for worsening symptoms or pain, consider antibiotics if necessary - Encourage continued fluid intake  Acute Cough Started after exposure to cleaning solution. Mostly dry, worse at night. No fever or chills. Likely related to chemical irritation. Should resolve as irritation  decreases. - Continue using albuterol inhaler - Advise use of humidifier - Monitor symptoms and report if no improvement   Return if symptoms worsen or fail to improve.      I discussed the assessment and treatment plan with the patient  The patient was provided an opportunity to ask questions and all were answered. The patient agreed with the plan and demonstrated an understanding of the instructions.   The patient was advised to call back or seek an in-person evaluation if the symptoms worsen or if the condition fails to improve as anticipated.    Sherlyn Hay, DO  [facility] [phone] (phone) [phone] (fax)  [facility]

## 2023-08-09 NOTE — Patient Instructions (Addendum)
 May use over-the-counter sore throat lozenges for throat discomfort, such cepacol, containing benzocaine for numbing.  - Sprays or lozenges containing the ingredient menthol  or phenol are also reasonable options

## 2023-08-16 ENCOUNTER — Encounter: Payer: Self-pay | Admitting: Family Medicine

## 2023-08-16 ENCOUNTER — Telehealth: Payer: Self-pay

## 2023-08-16 NOTE — Telephone Encounter (Signed)
 Copied from CRM [id number]. Topic: General - Other >> Aug 15, 2023 12:29 PM Macon Large wrote: Reason for CRM: Pt stated that she can not see the after visit summary from her appt on 08/09/23 on [facility]. Pt requests that the information be listed in [facility] so she is able to view it. Cb# [phone]

## 2023-08-20 ENCOUNTER — Other Ambulatory Visit: Payer: Self-pay

## 2023-08-20 DIAGNOSIS — R112 Nausea with vomiting, unspecified: Secondary | ICD-10-CM | POA: Insufficient documentation

## 2023-08-20 DIAGNOSIS — Z20822 Contact with and (suspected) exposure to covid-19: Secondary | ICD-10-CM | POA: Insufficient documentation

## 2023-08-20 DIAGNOSIS — R110 Nausea: Secondary | ICD-10-CM | POA: Diagnosis not present

## 2023-08-20 DIAGNOSIS — I10 Essential (primary) hypertension: Secondary | ICD-10-CM | POA: Insufficient documentation

## 2023-08-20 DIAGNOSIS — R109 Unspecified abdominal pain: Secondary | ICD-10-CM | POA: Insufficient documentation

## 2023-08-20 DIAGNOSIS — R197 Diarrhea, unspecified: Secondary | ICD-10-CM | POA: Insufficient documentation

## 2023-08-20 DIAGNOSIS — R509 Fever, unspecified: Secondary | ICD-10-CM | POA: Diagnosis not present

## 2023-08-20 LAB — COMPREHENSIVE METABOLIC PANEL
ALT: 7 U/L (ref 0–44)
AST: 16 U/L (ref 15–41)
Albumin: 3.9 g/dL (ref 3.5–5.0)
Alkaline Phosphatase: 71 U/L (ref 38–126)
Anion gap: 14 (ref 5–15)
BUN: 11 mg/dL (ref 8–23)
CO2: 22 mmol/L (ref 22–32)
Calcium: 9.1 mg/dL (ref 8.9–10.3)
Chloride: 101 mmol/L (ref 98–111)
Creatinine, Ser: 0.71 mg/dL (ref 0.44–1.00)
GFR, Estimated: >60 mL/min (ref >60)
Glucose, Bld: 124 mg/dL — ABNORMAL HIGH (ref 70–99)
Potassium: 3.3 mmol/L — ABNORMAL LOW (ref 3.5–5.1)
Sodium: 137 mmol/L (ref 135–145)
Total Bilirubin: 0.6 mg/dL (ref 0.0–1.2)
Total Protein: 7.4 g/dL (ref 6.5–8.1)

## 2023-08-20 LAB — CBC
HCT: 41.4 % (ref 36.0–46.0)
Hemoglobin: 14 g/dL (ref 12.0–15.0)
MCH: 28.3 pg (ref 26.0–34.0)
MCHC: 33.8 g/dL (ref 30.0–36.0)
MCV: 83.6 fL (ref 80.0–100.0)
Platelets: 318 10*3/uL (ref 150–400)
RBC: 4.95 MIL/uL (ref 3.87–5.11)
RDW: 13.1 % (ref 11.5–15.5)
WBC: 10.4 10*3/uL (ref 4.0–10.5)
nRBC: 0 % (ref 0.0–0.2)

## 2023-08-20 LAB — RESP PANEL BY RT-PCR (RSV, FLU A&B, COVID)  RVPGX2
Influenza A by PCR: NEGATIVE
Influenza B by PCR: NEGATIVE
Resp Syncytial Virus by PCR: NEGATIVE
SARS Coronavirus 2 by RT PCR: NEGATIVE

## 2023-08-20 LAB — LIPASE, BLOOD: Lipase: 24 U/L (ref 11–51)

## 2023-08-20 MED ORDER — ONDANSETRON 4 MG PO TBDP
4.0000 mg | ORAL_TABLET | Freq: Once | ORAL | Status: AC | PRN
  Administered 2023-08-20: 4 mg via ORAL
  Filled 2023-08-20: qty 1

## 2023-08-20 NOTE — ED Triage Notes (Signed)
First nurse note:   Pt presents via EMS from home c/o N/V/D and abd cramping starting today.  Reports sick contact with norovirus in home.   BP 137/72 hr 67 100% temp 100.0 oral

## 2023-08-20 NOTE — ED Triage Notes (Signed)
Pt reports central abdominal pain that started today. She reports she has not vomited but feels nauseous. One episode of diarrhea this morning.

## 2023-08-21 ENCOUNTER — Emergency Department
Admission: EM | Admit: 2023-08-21 | Discharge: 2023-08-21 | Disposition: A | Discharge status: Home / Self Care | Payer: 59 | Attending: Emergency Medicine | Admitting: Emergency Medicine

## 2023-08-21 DIAGNOSIS — R112 Nausea with vomiting, unspecified: Secondary | ICD-10-CM

## 2023-08-21 MED ORDER — ONDANSETRON HCL 4 MG PO TABS: 4.0000 mg | ORAL_TABLET | Freq: Three times a day (TID) | ORAL | 0 refills | Status: AC | PRN

## 2023-08-21 NOTE — Discharge Instructions (Addendum)
Take Zofran as needed for nausea Drink plenty of fluids to stay well-hydrated.  Find Pedialyte or similar electrolyte rehydration formulas at your local pharmacy. Thank you for choosing [place] for your health care today!  Please see your primary doctor this week for a follow up appointment.   If you have any new, worsening, or unexpected symptoms call your doctor right away or come back to the emergency department for reevaluation.  It was my pleasure to care for you today.   Daneil Dan Modesto Charon, MD

## 2023-08-21 NOTE — ED Provider Notes (Signed)
[facility] Provider Note    Event Date/Time   First MD Initiated Contact with Patient 08/21/23 [phone]     (approximate)   History   Abdominal Pain   HPI  [name] is a 68 y.o. female   Past medical history of GERD hypertension hyperlipidemia presents to the Emergency Department with 1 day of nausea and diarrhea.  Family member with the same symptoms.  No fevers or chills.  Mild abdominal cramping pain intermittently.  No urinary symptoms.  No GI bleeding.  No other acute medical complaints.       Physical Exam   Triage Vital Signs: ED Triage Vitals  Encounter Vitals Group     BP 08/20/23 2152 113/65     Systolic BP Percentile --      Diastolic BP Percentile --      Pulse Rate 08/20/23 2152 70     Resp 08/20/23 2152 20     Temp 08/20/23 2152 98.1 F (36.7 C)     Temp Source 08/20/23 2152 Oral     SpO2 08/20/23 2152 96 %     Weight 08/20/23 2151 162 lb (73.5 kg)     Height 08/20/23 2151 5' 4.75" (1.645 m)     Head Circumference --      Peak Flow --      Pain Score 08/20/23 2151 10     Pain Loc --      Pain Education --      Exclude from Growth Chart --     Most recent vital signs: Vitals:   08/20/23 2152 08/21/23 0138  BP: 113/65 122/62  Pulse: 70 70  Resp: 20 16  Temp: 98.1 F (36.7 C) 98.4 F (36.9 C)  SpO2: 96% 95%    General: Awake, no distress.  CV:  Good peripheral perfusion.  Resp:  Normal effort.  Abd:  No distention.  Other:  Pleasant woman in no acute distress with normal vital signs and afebrile.  Nontoxic appearance.  Soft nontender abdomen to deep palpation in all quadrants without rigidity or guarding.  Negative Murphy sign.  Negative rebound.   ED Results / Procedures / Treatments   Labs (all labs ordered are listed, but only abnormal results are displayed) Labs Reviewed  COMPREHENSIVE METABOLIC PANEL - Abnormal; Notable for the following components:      Result Value   Potassium 3.3 (*)     Glucose, Bld 124 (*)    All other components within normal limits  RESP PANEL BY RT-PCR (RSV, FLU A&B, COVID)  RVPGX2  LIPASE, BLOOD  CBC     I ordered and reviewed the above labs they are notable for potassium slightly low at 3.3.  White blood cell count is normal.  Viral panel is negative.      PROCEDURES:  Critical Care performed: No  Procedures   MEDICATIONS ORDERED IN ED: Medications  ondansetron (ZOFRAN-ODT) disintegrating tablet 4 mg (4 mg Oral Given 08/20/23 2207)   IMPRESSION / MDM / ASSESSMENT AND PLAN / ED COURSE  I reviewed the triage vital signs and the nursing notes.                                Patient's presentation is most consistent with acute presentation with potential threat to life or bodily function.  Differential diagnosis includes, but is not limited to, viral gastroenteritis, colitis, dehydration or electrolyte abnormality, considered but less  likely intra-abdominal surgical infection or obstruction    MDM: Healthy woman with 1 day of symptoms most resembling viral gastroenteritis with known sick contact with the same.  Check labs but no signs of renal dysfunction or electrolyte abnormalities and this otherwise very well-appearing woman who symptoms have subsided with time and her nausea has subsided with Zofran.  I considered abdominal surgical pathologies that appendicitis cholecystitis obstruction colitis abscesses but I think less likely given her very benign abdominal exam.  We discussed the utility of CT imaging but given low clinical suspicion and shared decision making we will defer advanced imaging at this time and instead focus on supportive care for her symptoms.  Will discharge with Zofran, hydrate at home, anticipatory guidance was given to the patient she will return with any new or worsening symptoms otherwise she will follow-up with PMD.       FINAL CLINICAL IMPRESSION(S) / ED DIAGNOSES   Final diagnoses:  Nausea vomiting and  diarrhea     Rx / DC Orders   ED Discharge Orders          Ordered    ondansetron (ZOFRAN) 4 MG tablet  Every 8 hours PRN        08/21/23 0550             Note:  This document was prepared using Dragon voice recognition software and may include unintentional dictation errors.    Pilar Jarvis, MD 08/21/23 [phone]

## 2023-08-29 ENCOUNTER — Telehealth: Payer: Self-pay

## 2023-08-29 NOTE — Progress Notes (Signed)
 Transition Care Management Unsuccessful Follow-up Telephone Call  Date of discharge and from where:  08/21/2023 [facility]  Attempts:  1st Attempt  Reason for unsuccessful TCM follow-up call:  Left voice message  Lashundra Shiveley [facility]  [facility] Guide Direct Dial: [phone]  Fax: [phone] Website: [email or web address]

## 2023-08-30 ENCOUNTER — Telehealth: Payer: Self-pay

## 2023-08-30 NOTE — Progress Notes (Signed)
 Transition Care Management Follow-up Telephone Call Date of discharge and from where: 08/21/2023 [facility] How have you been since you were released from the hospital? Patient stated she is feeling a lot better. Any questions or concerns? No  Items Reviewed: Did the pt receive and understand the discharge instructions provided? Yes  Medications obtained and verified? Yes  Other? No  Any new allergies since your discharge? No  Dietary orders reviewed? Yes Do you have support at home? Yes   Follow up appointments reviewed:  PCP Hospital f/u appt confirmed? Yes  Scheduled to see Curtis Boom, NP on 09/07/2023 @ [facility] f/u appt confirmed? Yes  Scheduled to see Hoi T. Guadlupe, MD on 10/06/2023 @ [facility]. Are transportation arrangements needed? No  If their condition worsens, is the pt aware to call PCP or go to the Emergency Dept.? Yes Was the patient provided with contact information for the PCP's office or ED? Yes Was to pt encouraged to call back with questions or concerns? No   Kenadee Gates [facility]  [facility] Guide Direct Dial: [phone]  Fax: [phone] Website: [email or web address]

## 2023-08-31 ENCOUNTER — Inpatient Hospital Stay: Payer: 59 | Admitting: Family Medicine

## 2023-08-31 ENCOUNTER — Ambulatory Visit: Payer: 59 | Admitting: Family Medicine

## 2023-09-07 ENCOUNTER — Ambulatory Visit: Payer: 59 | Admitting: Family Medicine

## 2023-09-07 ENCOUNTER — Ambulatory Visit
Admission: RE | Admit: 2023-09-07 | Discharge: 2023-09-07 | Disposition: A | Discharge status: Home / Self Care | Payer: 59 | Source: Ambulatory Visit | Attending: Family Medicine

## 2023-09-07 ENCOUNTER — Ambulatory Visit
Admission: RE | Admit: 2023-09-07 | Discharge: 2023-09-07 | Disposition: A | Discharge status: Home / Self Care | Payer: 59 | Source: Ambulatory Visit | Attending: Family Medicine | Admitting: Family Medicine

## 2023-09-07 ENCOUNTER — Encounter: Payer: Self-pay | Admitting: Family Medicine

## 2023-09-07 ENCOUNTER — Inpatient Hospital Stay: Payer: 59 | Admitting: Family Medicine

## 2023-09-07 ENCOUNTER — Ambulatory Visit
Admission: RE | Admit: 2023-09-07 | Discharge: 2023-09-07 | Disposition: A | Discharge status: Home / Self Care | Payer: 59 | Attending: Family Medicine | Admitting: Family Medicine

## 2023-09-07 VITALS — BP 140/79 | HR 83 | Ht 65.0 in | Wt 163.9 lb

## 2023-09-07 DIAGNOSIS — Z130 Encounter for screening for diseases of the blood and blood-forming organs and certain disorders involving the immune mechanism: Secondary | ICD-10-CM

## 2023-09-07 DIAGNOSIS — E7800 Pure hypercholesterolemia, unspecified: Secondary | ICD-10-CM

## 2023-09-07 DIAGNOSIS — M542 Cervicalgia: Secondary | ICD-10-CM | POA: Diagnosis present

## 2023-09-07 DIAGNOSIS — R7303 Prediabetes: Secondary | ICD-10-CM | POA: Diagnosis not present

## 2023-09-07 DIAGNOSIS — I10 Essential (primary) hypertension: Secondary | ICD-10-CM | POA: Diagnosis not present

## 2023-09-07 DIAGNOSIS — Z87898 Personal history of other specified conditions: Secondary | ICD-10-CM | POA: Insufficient documentation

## 2023-09-07 NOTE — Assessment & Plan Note (Addendum)
Last A1C was 6.3, indicating good control. -Check A1C in March 2025. -Continue metformin 750 mg BID -Follow-up in 4 months if A1C remains controlled. -Continue small frequent meals, increase veggies, fruits, proteins, water as drink of choice.

## 2023-09-07 NOTE — Assessment & Plan Note (Addendum)
Pain and limited range of motion in the neck for the past two weeks.  History of cervical disc surgeries, per pt more than 10 years ago No current concerning neurological symptoms (weakness, numbness, tingling, strength changes).  Muscular tension noted on examination. Appears at this time muscular in nature -Order neck X-ray to assess for any abnormalities or cervical concerns -Refer to physical therapy for neck strengthening and stretching exercises. -Advise to continue with Tylenol and Flexeril as needed for pain control. -Continue heat and ice, at home rest and stretch -If pain persists or worsens, recommend follow-up with neurosurgeon. -discussed emergent symptoms and when to present to ED

## 2023-09-07 NOTE — Progress Notes (Signed)
Established Patient Office Visit  Introduced to nurse practitioner role and practice setting.  All questions answered.  Discussed provider/patient relationship and expectations.   Subjective   Patient ID: [name], female    DOB: May 12, 1956  Age: 68 y.o. MRN: [id number]  Chief Complaint  Patient presents with   Hospitalization Follow-up    Patient seen at [facility] on 08/21/23 due to nausea, vomiting and diarrhea. She reports feeling great and back to her normal self except for pain in her neck. Patient reports neck was really sore about a week ago and could not turn complelty and had pain in shoulder blade but started using muscle relaxant at night with pain patches. Soreness lingering from that. Patient would like to see about MRI to make sure nothing severe going on. Reports hx of surgery    [name] is a 68 year old female with a history of cervical disc surgeries who presents with neck pain and coordination issues.  She was seen in ED on 08/20/23 for nausea and vomiting - was viral in nature. No other concerns, symptoms resolved.   Neck Pain - The neck pain is described as soreness that limits her ability to turn her head fully. The pain is located in the neck and radiates to the shoulder blade, although the shoulder blade pain has resolved. The neck pain is not as severe as it was initially but still persists, especially when turning her neck. She rates the pain as a 6 to 7 out of 10, with the worst pain occurring about a week ago. No numbness or tingling in her hands and full strength in her upper extremities and lower extremities  She has a history of two cervical disc surgeries and one post cervical disc surgery due to disc issues pressing against her nerves, which previously caused walking difficulties, more than 10 years ago. She mentions that she is currently able to walk without issues, suggesting that the current symptoms are different from her past experiences. She has  been using patches at home to alleviate the neck pain, which has provided some relief.  Her medication regimen includes atorvastatin for cholesterol, a nasal spray, daily calcium and vitamin D supplements, Premarin cream for recurrent infections, fish oil, Zyrtec for allergies, Plavix following a mini-stroke, montelukast for allergies, metformin for diabetes, Protonix for gastric reflux, and amlodipine for blood pressure. She takes her medications at night.         08/09/2023    1:23 PM 05/23/2023    4:05 PM 04/06/2023    9:52 AM  Depression screen PHQ 2/9  Decreased Interest 0 0 0  Down, Depressed, Hopeless 0 0 0  PHQ - 2 Score 0 0 0  Altered sleeping  0 0  Tired, decreased energy  0 1  Change in appetite  0 0  Feeling bad or failure about yourself   0 0  Trouble concentrating  0 0  Moving slowly or fidgety/restless  0 0  Suicidal thoughts  0 0  PHQ-9 Score  0 1       08/09/2023    1:23 PM 04/06/2023    9:52 AM  GAD 7 : Generalized Anxiety Score  Nervous, Anxious, on Edge 0 0  Control/stop worrying 0 0  Worry too much - different things 0 0  Trouble relaxing 0 0  Restless 0 0  Easily annoyed or irritable 0 1  Afraid - awful might happen 0 0  Total GAD 7 Score 0 1  Review of Systems  All other systems reviewed and are negative.   Negative unless indicated in HPI   Objective:     BP (!) 140/79 (BP Location: Left Arm, Patient Position: Sitting, Cuff Size: Normal)   Pulse 83   Ht 5\' 5"  (1.651 m)   Wt 163 lb 14.4 oz (74.3 kg)   SpO2 98%   BMI 27.27 kg/m    Physical Exam Constitutional:      General: She is not in acute distress.    Appearance: Normal appearance. She is not toxic-appearing or diaphoretic.  HENT:     Head: Normocephalic.     Nose: Nose normal.     Mouth/Throat:     Mouth: Mucous membranes are moist.  Eyes:     Extraocular Movements: Extraocular movements intact.  Cardiovascular:     Rate and Rhythm: Normal rate and regular rhythm.      Pulses: Normal pulses.     Heart sounds: Normal heart sounds. No murmur heard.    No friction rub. No gallop.  Pulmonary:     Effort: No respiratory distress.     Breath sounds: No stridor. No wheezing, rhonchi or rales.  Chest:     Chest wall: No tenderness.  Musculoskeletal:        General: Tenderness present.     Cervical back: Spasms, tenderness and bony tenderness present. No swelling, edema, deformity, erythema, signs of trauma, lacerations, rigidity, torticollis or crepitus. Pain with movement present. Normal range of motion.     Thoracic back: Normal.     Lumbar back: Normal.     Right lower leg: No edema.     Left lower leg: No edema.  Lymphadenopathy:     Cervical: No cervical adenopathy.  Skin:    General: Skin is warm and dry.     Capillary Refill: Capillary refill takes less than 2 seconds.  Neurological:     General: No focal deficit present.     Mental Status: She is alert and oriented to person, place, and time. Mental status is at baseline.     Cranial Nerves: No cranial nerve deficit.     Sensory: No sensory deficit.     Motor: No weakness.     Coordination: Coordination normal.     Gait: Gait normal.  Psychiatric:        Mood and Affect: Mood normal.        Behavior: Behavior normal.        Thought Content: Thought content normal.        Judgment: Judgment normal.      No results found for any visits on 09/07/23.    The ASCVD Risk score (Arnett DK, et al., 2019) failed to calculate for the following reasons:   Risk score cannot be calculated because patient has a medical history suggesting prior/existing ASCVD    Assessment & Plan:  Cervical pain (neck) Assessment & Plan: Pain and limited range of motion in the neck for the past two weeks.  History of cervical disc surgeries, per pt more than 10 years ago No current concerning neurological symptoms (weakness, numbness, tingling, strength changes).  Muscular tension noted on examination. Appears  at this time muscular in nature -Order neck X-ray to assess for any abnormalities or cervical concerns -Refer to physical therapy for neck strengthening and stretching exercises. -Advise to continue with Tylenol and Flexeril as needed for pain control. -Continue heat and ice, at home rest and stretch -If pain persists or worsens, recommend  follow-up with neurosurgeon. -discussed emergent symptoms and when to present to ED  Orders: -     Ambulatory referral to Physical Therapy -     DG Cervical Spine Complete; Future  Primary hypertension Assessment & Plan: Chronic, with borderline elevation today GOAL<130/80 Encouraged to continue to follow low fat, low sodium diet and continue regular exercise to assist Continue daily Norvasc 10 mg  Consider second agent at next visit if SBP remains above 130 Monitor at home with upper arm cuff CMP ordered for future     Orders: -     Comprehensive metabolic panel; Future -     Lipid panel; Future  Prediabetes Assessment & Plan: Last A1C was 6.3, indicating good control. -Check A1C in March 2025. -Continue metformin 750 mg BID -Follow-up in 4 months if A1C remains controlled. -Continue small frequent meals, increase veggies, fruits, proteins, water as drink of choice.  Orders: -     Hemoglobin A1c; Future  Screening for deficiency anemia -     CBC with Differential/Platelet; Future  Hypercholesteremia Assessment & Plan: Chronic Will check fasting lipid- future labs ordered for March 2025 On atorvastatin 40 mg for cholesterol management. -Continue current regimen.    History of nausea and vomiting Assessment & Plan: Recent visit to ED on 08/20/23 for nausea and vomiting Ruled out as viral infection Symptoms since resolved No concerns or issues today     Return in about 4 months (around 01/05/2024) for BP and DM check in .   I, Sallee Provencal, FNP, have reviewed all documentation for this visit. The documentation on  09/07/23 for the exam, diagnosis, procedures, and orders are all accurate and complete.   Sallee Provencal, FNP

## 2023-09-07 NOTE — Assessment & Plan Note (Signed)
Recent visit to ED on 08/20/23 for nausea and vomiting Ruled out as viral infection Symptoms since resolved No concerns or issues today

## 2023-09-07 NOTE — Assessment & Plan Note (Addendum)
Chronic, with borderline elevation today GOAL<130/80 Encouraged to continue to follow low fat, low sodium diet and continue regular exercise to assist Continue daily Norvasc 10 mg  Consider second agent at next visit if SBP remains above 130 Monitor at home with upper arm cuff CMP ordered for future

## 2023-09-07 NOTE — Assessment & Plan Note (Addendum)
Chronic Will check fasting lipid- future labs ordered for March 2025 On atorvastatin 40 mg for cholesterol management. -Continue current regimen.

## 2023-09-26 ENCOUNTER — Encounter: Payer: Self-pay | Admitting: Family Medicine

## 2023-10-03 ENCOUNTER — Telehealth: Payer: Self-pay

## 2023-10-03 NOTE — Telephone Encounter (Signed)
 Copied from CRM [id number]. Topic: General - Other >> Oct 03, 2023  9:20 AM [name] wrote: Reason for CRM: The patient is calling to request completion of an FL2 form for the patient's DSS caseworker for their special assistance coverage   Please contact the patient when the form is ready

## 2023-10-04 ENCOUNTER — Ambulatory Visit: Admitting: Family Medicine

## 2023-10-04 ENCOUNTER — Encounter: Payer: Self-pay | Admitting: Family Medicine

## 2023-10-04 VITALS — BP 123/66 | HR 65 | Ht 65.0 in | Wt 162.0 lb

## 2023-10-04 DIAGNOSIS — I10 Essential (primary) hypertension: Secondary | ICD-10-CM

## 2023-10-04 DIAGNOSIS — M546 Pain in thoracic spine: Secondary | ICD-10-CM

## 2023-10-04 DIAGNOSIS — G8929 Other chronic pain: Secondary | ICD-10-CM

## 2023-10-04 DIAGNOSIS — M542 Cervicalgia: Secondary | ICD-10-CM | POA: Diagnosis not present

## 2023-10-04 NOTE — Progress Notes (Signed)
 Established patient visit   Patient: [name]   DOB: October 31, 1955   68 y.o. Female  MRN: [id number] Visit Date: 10/04/2023  Today's healthcare provider: Ronnald Ramp, MD   Chief Complaint  Patient presents with   Other   FL2 forms     Discuss forms    Subjective     HPI     FL2 forms     Additional comments: Discuss forms       Last edited by Thedora Hinders, CMA on 10/04/2023  1:55 PM.       Discussed the use of AI scribe software for clinical note transcription with the patient, who gave verbal consent to proceed.  History of Present Illness   The patient is a 68 year old with chronic back pain and hypertension who presents for adult care home FL2 form completion.  She is here for the completion of an FL2 form required for special assistance from social services. The form is outdated and needs renewal to continue receiving financial assistance, which helps with her bills and food stamps. She is independent in her daily activities and does not require in-home care for tasks such as eating, bathing, or dressing.  She has chronic back pain and has undergone two cervical disc surgeries and one post-cervical disc surgery. Recently, she experienced increased neck pain starting last month, with soreness and difficulty turning her head. Imaging studies were performed, and she is awaiting further evaluation by her neurologist. She continues to experience pain on both sides of her neck.  She is on disability and mentions financial constraints impacting her ability to cover bills. This financial assistance is crucial for her daily living expenses.  She notes a cataract diagnosis, with a follow-up appointment scheduled for next month. She mentions breaking her glasses, which has been an inconvenience, but does not report any issues with sight, hearing, or speech aside from the cataract.  She has had recent lab work done in September, with no significant  findings reported. She is aware of an upcoming A1c test that needs to be completed by tomorrow.         Past Medical History:  Diagnosis Date   Allergy    Anemia    takes Ferrous Sulfate daily   Arthritis    GERD (gastroesophageal reflux disease)    takes OTC med   Heart murmur    Hyperlipidemia    Hypertension    takes Amlodipine daily   Low back pain    occasionally    Medications: Outpatient Medications Prior to Visit  Medication Sig   albuterol (VENTOLIN HFA) 108 (90 Base) MCG/ACT inhaler Inhale 2 puffs into the lungs every 4 (four) hours as needed for wheezing or shortness of breath.   amLODipine (NORVASC) 10 MG tablet Take 1 tablet (10 mg total) by mouth daily. TAKE 1 TABLET BY MOUTH DAILY   atorvastatin (LIPITOR) 40 MG tablet TAKE 1 TABLET BY MOUTH DAILY   azelastine (OPTIVAR) 0.05 % ophthalmic solution Place 1 drop into both eyes 2 (two) times daily. For up to 8 weeks   CALCIUM PO    Cholecalciferol (VITAMIN D-3 PO)    clopidogrel (PLAVIX) 75 MG tablet TAKE 1 TABLET BY MOUTH DAILY   conjugated estrogens (PREMARIN) vaginal cream Place 1 Applicatorful vaginally 2 (two) times a week. Place 0.5g nightly for two weeks then twice a week after   conjugated estrogens (PREMARIN) vaginal cream Place 1 Applicatorful vaginally 2 (two) times a  week. Place 0.5g nightly for two weeks then twice a week after   cyclobenzaprine (FLEXERIL) 5 MG tablet Take 1 tablet (5 mg total) by mouth at bedtime.   Fish Oil-Cholecalciferol (FISH OIL + D3 PO)    fluticasone (FLONASE) 50 MCG/ACT nasal spray Place 2 sprays into both nostrils daily.   loratadine (CLARITIN) 10 MG tablet Take 1 tablet (10 mg total) by mouth daily.   metFORMIN (GLUCOPHAGE-XR) 750 MG 24 hr tablet Take 1 tablet (750 mg total) by mouth 2 (two) times daily before a meal.   montelukast (SINGULAIR) 10 MG tablet Take 1 tablet (10 mg total) by mouth at bedtime.   ondansetron (ZOFRAN) 4 MG tablet Take 1 tablet (4 mg total) by mouth  every 8 (eight) hours as needed for nausea or vomiting.   pantoprazole (PROTONIX) 40 MG tablet TAKE 1 TABLET BY MOUTH DAILY   Simethicone 125 MG CAPS    No facility-administered medications prior to visit.    Review of Systems  Last CBC Lab Results  Component Value Date   WBC 10.4 08/20/2023   HGB 14.0 08/20/2023   HCT 41.4 08/20/2023   MCV 83.6 08/20/2023   MCH 28.3 08/20/2023   RDW 13.1 08/20/2023   PLT 318 08/20/2023   Last metabolic panel Lab Results  Component Value Date   GLUCOSE 124 (H) 08/20/2023   NA 137 08/20/2023   K 3.3 (L) 08/20/2023   CL 101 08/20/2023   CO2 22 08/20/2023   BUN 11 08/20/2023   CREATININE 0.71 08/20/2023   GFRNONAA >60 08/20/2023   CALCIUM 9.1 08/20/2023   PROT 7.4 08/20/2023   ALBUMIN 3.9 08/20/2023   LABGLOB 2.4 04/06/2023   AGRATIO 1.5 05/19/2022   BILITOT 0.6 08/20/2023   ALKPHOS 71 08/20/2023   AST 16 08/20/2023   ALT 7 08/20/2023   ANIONGAP 14 08/20/2023   Last lipids Lab Results  Component Value Date   CHOL 149 04/06/2023   HDL 46 04/06/2023   LDLCALC 80 04/06/2023   TRIG 132 04/06/2023   CHOLHDL 3.2 04/06/2023   Last hemoglobin A1c Lab Results  Component Value Date   HGBA1C 6.2 (H) 07/06/2023   Last thyroid functions Lab Results  Component Value Date   TSH 0.982 04/06/2023   Last vitamin D Lab Results  Component Value Date   VD25OH 43.7 04/06/2023   Last vitamin B12 and Folate No results found for: "VITAMINB12", "FOLATE"      Objective    BP 123/66   Pulse 65   Ht 5\' 5"  (1.651 m)   Wt 162 lb (73.5 kg)   SpO2 99%   BMI 26.96 kg/m  BP Readings from Last 3 Encounters:  10/04/23 123/66  09/07/23 (!) 140/79  08/21/23 122/62   Wt Readings from Last 3 Encounters:  10/04/23 162 lb (73.5 kg)  09/07/23 163 lb 14.4 oz (74.3 kg)  08/20/23 162 lb (73.5 kg)        Physical Exam  Physical Exam       General: Alert, no acute distress Cardio: Normal S1 and S2, RRR, no r/m/g Pulm: CTAB, normal work  of breathing   No results found for any visits on 10/04/23.  Assessment & Plan     Problem List Items Addressed This Visit   None      Chronic Low Back Pain Chronic low back pain persists, contributing to her need for special assistance. She has undergone two cervical disc surgeries and one post cervical disc surgery. Neck pain, which  began last month, limits her ability to turn her head due to bilateral pain. Imaging was performed, and further evaluation by her neurosurgeon is advised. - Refer to neurosurgeon Dr. Donalee Citrin in [place] for further evaluation and management of neck pain.  Cataracts Diagnosed with cataracts, currently monitored by her ophthalmologist. She has an appointment next month. She reports broken glasses requiring replacement. - Attend scheduled ophthalmology appointment next month. - Obtain new glasses.  Hypertension Hypertension is a factor in her need for special assistance and is being monitored as part of her chronic conditions.  Hyperlipidemia Hyperlipidemia is a factor in her need for special assistance and is being monitored as part of her chronic conditions.  General Health Maintenance Routine health maintenance includes monitoring A1c levels. She has a MyChart notification to complete A1c testing by October 05, 2023. - Complete A1c testing by October 05, 2023. Completed FL2 form for patient to continue   Follow-up Next physical wellness visit is due in June. Recent labs from September showed no significant issues. - Schedule and attend physical wellness visit in June.       Completed FL2 form during visit  Copy made to be scanned into chart  Medication list attached to form that was provided to patient   No follow-ups on file.         Ronnald Ramp, MD  [facility] [phone] (phone) [phone] (fax)  [facility]

## 2023-10-04 NOTE — Telephone Encounter (Signed)
 Can someone please reach out to offer this pt a sooner appt to discuss form per Dr Roxan Hockey

## 2023-10-04 NOTE — Telephone Encounter (Signed)
 Patient scheduled appointment for today

## 2023-10-04 NOTE — Telephone Encounter (Signed)
 Please contact patient to reschedule for closer appointment to review history and paperwork and for patient to meet new physician

## 2023-10-04 NOTE — Telephone Encounter (Signed)
 Tried again with no answer

## 2023-10-04 NOTE — Telephone Encounter (Signed)
 Called pt and no answer and no vm set up.  If pt returns call please schedule appt.

## 2023-10-06 ENCOUNTER — Encounter: Payer: Self-pay | Admitting: Obstetrics

## 2023-10-06 ENCOUNTER — Ambulatory Visit: Payer: 59 | Admitting: Obstetrics

## 2023-10-06 VITALS — BP 117/65 | HR 66

## 2023-10-06 DIAGNOSIS — Z87440 Personal history of urinary (tract) infections: Secondary | ICD-10-CM

## 2023-10-06 DIAGNOSIS — N393 Stress incontinence (female) (male): Secondary | ICD-10-CM

## 2023-10-06 DIAGNOSIS — N8110 Cystocele, unspecified: Secondary | ICD-10-CM

## 2023-10-06 DIAGNOSIS — R351 Nocturia: Secondary | ICD-10-CM

## 2023-10-06 DIAGNOSIS — R8290 Unspecified abnormal findings in urine: Secondary | ICD-10-CM | POA: Diagnosis not present

## 2023-10-06 NOTE — Assessment & Plan Note (Signed)
-   06/26/23 POCT UA + trace heme - pt denies blood in urine - negative UA microscopy

## 2023-10-06 NOTE — Progress Notes (Signed)
 [facility] Return Visit  SUBJECTIVE  History of Present Illness: [name] is a 68 y.o. female seen in follow-up for history of recurrent UTI, stage II pelvic organ prolapse, stress urinary incontinence, nocturia. Plan at last visit was bladder training, Kegel exercises, caffeine reduction, Premarin, probiotics.   Using vaginal estrogen 1g twice a week.  Denies antibiotic use for UTI or discharge or bleeding.   Resolution of leakage since reduction of caffeine, reduced to small sips of pepsi, uses 16oz bottle every 2-3 days.  Voids 2x/night down from 3x/night.    Past Medical History: Patient  has a past medical history of Allergy, Anemia, Arthritis, GERD (gastroesophageal reflux disease), Heart murmur, Hyperlipidemia, Hypertension, and Low back pain.   Past Surgical History: She  has a past surgical history that includes Neck surgery; Tubal ligation; Anterior cervical decomp/discectomy fusion (10/19/2011); Posterior cervical fusion/foraminotomy (N/A, 08/26/2013); Colonoscopy (08/2014); Esophagogastroduodenoscopy (egd) with propofol (N/A, 06/23/2015); Carpal tunnel release; Colonoscopy with propofol (N/A, 02/04/2020); and Lipoma excision (Left, 03/29/2022).   Medications: She has a current medication list which includes the following prescription(s): albuterol, amlodipine, atorvastatin, azelastine, calcium, cholecalciferol, clopidogrel, conjugated estrogens, conjugated estrogens, cyclobenzaprine, fish oil-cholecalciferol, fluticasone, loratadine, metformin, montelukast, ondansetron, pantoprazole, and simethicone.   Allergies: Patient is allergic to aspirin, hydrochlorothiazide, ibuprofen, lisinopril, and sulfa antibiotics.   Social History: Patient  reports that she has never smoked. She has never used smokeless tobacco. She reports that she does not drink alcohol and does not use drugs.     OBJECTIVE     Physical Exam: Vitals:   10/06/23 1030  BP: 117/65  Pulse:  66   Gen: No apparent distress, A&O x 3.  Detailed Urogynecologic Evaluation:  Deferred.      No data to display             ASSESSMENT AND PLAN    [name] is a 68 y.o. with:  1. History of recurrent UTI (urinary tract infection)   2. Nocturia   3. Pelvic organ prolapse quantification stage 2 cystocele   4. SUI (stress urinary incontinence, female)   5. Abnormal urinalysis     History of recurrent UTI (urinary tract infection) Assessment & Plan: - one positive urine culture on 04/20/23 with 24-50K pansensitive E. Coli.  - reviewed definition of rUTI with 2 symptomatic culture proven UTI in 6 months or 3 in 1 year. - For treatment of recurrent urinary tract infections, we discussed management of recurrent UTIs including prophylaxis with a daily low dose antibiotic, transvaginal estrogen therapy, D-mannose, and cranberry supplements.  We discussed the role of diagnostic testing such as cystoscopy and upper tract imaging.   - continue vaginal premarin 1g twice a week - encouraged avoidance of antibiotic use due to risk of antibiotic resistant uropathogens and return to office if she experiences change in urinary symptoms   Nocturia Assessment & Plan: - improved with caffeine restriction, encouraged to continue to limit use - avoid fluid intake after 6pm - We discussed the symptoms of overactive bladder (OAB), which include urinary urgency, urinary frequency, nocturia, with or without urge incontinence.  While we do not know the exact etiology of OAB, several treatment options exist. We discussed management including behavioral therapy (decreasing bladder irritants, urge suppression strategies, timed voids, bladder retraining), physical therapy, medication; for refractory cases posterior tibial nerve stimulation, sacral neuromodulation, and intravesical botulinum toxin injection.  For anticholinergic medications, we discussed the potential side effects of anticholinergics  including dry eyes, dry mouth, constipation, cognitive impairment  and urinary retention. For Beta-3 agonist medication, we discussed the potential side effect of elevated blood pressure which is more likely to occur in individuals with uncontrolled hypertension. - continue to encourage bladder training, Kegel exercises and caffeine reduction - pt desires to continue expectant management at this time due to relief   Pelvic organ prolapse quantification stage 2 cystocele Assessment & Plan: - asymptomatic with no change in symptoms - For treatment of pelvic organ prolapse, we discussed options for management including expectant management, conservative management, and surgical management, such as Kegels, a pessary, pelvic floor physical therapy, and specific surgical procedures.    SUI (stress urinary incontinence, female) Assessment & Plan: - previously minimal bother per patient and denies leakage - + leakage with valsalva, prior bladder scan 24mL - For treatment of stress urinary incontinence,  non-surgical options include expectant management, weight loss, physical therapy, as well as a pessary.  Surgical options include a midurethral sling, Burch urethropexy, and transurethral injection of a bulking agent. - pt desires expectant mgmt at this time and continue vaginal estrogen   Abnormal urinalysis Assessment & Plan: - 06/26/23 POCT UA + trace heme - pt denies blood in urine - negative UA microscopy     Lindsay Chance, MD

## 2023-10-06 NOTE — Assessment & Plan Note (Signed)
-   one positive urine culture on 04/20/23 with 24-50K pansensitive E. Coli.  - reviewed definition of rUTI with 2 symptomatic culture proven UTI in 6 months or 3 in 1 year. - For treatment of recurrent urinary tract infections, we discussed management of recurrent UTIs including prophylaxis with a daily low dose antibiotic, transvaginal estrogen therapy, D-mannose, and cranberry supplements.  We discussed the role of diagnostic testing such as cystoscopy and upper tract imaging.   - continue vaginal premarin 1g twice a week - encouraged avoidance of antibiotic use due to risk of antibiotic resistant uropathogens and return to office if she experiences change in urinary symptoms

## 2023-10-06 NOTE — Assessment & Plan Note (Signed)
-   previously minimal bother per patient and denies leakage - + leakage with valsalva, prior bladder scan 24mL - For treatment of stress urinary incontinence,  non-surgical options include expectant management, weight loss, physical therapy, as well as a pessary.  Surgical options include a midurethral sling, Burch urethropexy, and transurethral injection of a bulking agent. - pt desires expectant mgmt at this time and continue vaginal estrogen

## 2023-10-06 NOTE — Assessment & Plan Note (Signed)
-   improved with caffeine restriction, encouraged to continue to limit use - avoid fluid intake after 6pm - We discussed the symptoms of overactive bladder (OAB), which include urinary urgency, urinary frequency, nocturia, with or without urge incontinence.  While we do not know the exact etiology of OAB, several treatment options exist. We discussed management including behavioral therapy (decreasing bladder irritants, urge suppression strategies, timed voids, bladder retraining), physical therapy, medication; for refractory cases posterior tibial nerve stimulation, sacral neuromodulation, and intravesical botulinum toxin injection.  For anticholinergic medications, we discussed the potential side effects of anticholinergics including dry eyes, dry mouth, constipation, cognitive impairment and urinary retention. For Beta-3 agonist medication, we discussed the potential side effect of elevated blood pressure which is more likely to occur in individuals with uncontrolled hypertension. - continue to encourage bladder training, Kegel exercises and caffeine reduction - pt desires to continue expectant management at this time due to relief

## 2023-10-06 NOTE — Patient Instructions (Signed)
 Good work!   Continue to monitor your caffeine use.   Continue vaginal estrogen 1g twice a week.   Please return if you experience any change in vaginal or urinary symptoms.

## 2023-10-06 NOTE — Assessment & Plan Note (Signed)
-   asymptomatic with no change in symptoms - For treatment of pelvic organ prolapse, we discussed options for management including expectant management, conservative management, and surgical management, such as Kegels, a pessary, pelvic floor physical therapy, and specific surgical procedures.

## 2023-11-08 ENCOUNTER — Ambulatory Visit: Admitting: Family Medicine

## 2023-11-14 ENCOUNTER — Encounter: Payer: Self-pay | Admitting: Family Medicine

## 2023-11-14 ENCOUNTER — Ambulatory Visit (INDEPENDENT_AMBULATORY_CARE_PROVIDER_SITE_OTHER): Admitting: Family Medicine

## 2023-11-14 VITALS — BP 133/60 | HR 56 | Ht 65.0 in | Wt 162.0 lb

## 2023-11-14 DIAGNOSIS — R42 Dizziness and giddiness: Secondary | ICD-10-CM | POA: Diagnosis not present

## 2023-11-14 MED ORDER — AMLODIPINE BESYLATE 5 MG PO TABS: 5.0000 mg | ORAL_TABLET | Freq: Every day | ORAL | 0 refills | Status: DC

## 2023-11-14 NOTE — Progress Notes (Signed)
 ACUTE PATIENT VISIT    Patient: [name]   DOB: 1955-10-19   68 y.o. Female  MRN: [id number] Visit Date: 11/14/2023  Today's healthcare provider: Mimi Alt, MD   PCP: Mimi Alt, MD   Chief Complaint  Patient presents with   Dizziness    For the last week when she bend down and stand back up she feels dizzy     Subjective     HPI     Dizziness    Additional comments: For the last week when she bend down and stand back up she feels dizzy       Last edited by Bart Lieu, CMA on 11/14/2023 10:16 AM.       Discussed the use of AI scribe software for clinical note transcription with the patient, who gave verbal consent to proceed.  History of Present Illness          Discussed the use of AI scribe software for clinical note transcription with the patient, who gave verbal consent to proceed.  History of Present Illness She is a 68 year old female who presents with dizziness upon standing or bending over.  She experiences dizziness when standing up or bending over, particularly when reaching down to get something and then standing back up. The dizziness is described as a sensation of weakness and loss of balance, lasting about a minute. These episodes do not occur when transitioning from lying to sitting.  She denies any new teas, supplements, or caffeine intake that could contribute to her symptoms. Her current medication includes amlodipine  10 mg for blood pressure, which is usually around 130s/80s at home, although she does not check it regularly.  She mentions a history of ear pain and a sore in her ear, which she associates with the onset of dizziness. No changes in hearing, headaches, or sinus problems are reported. She also experiences phlegm in her throat, particularly when lying down, but no nasal congestion.  Recent lab work from January showed no anemia, with a hemoglobin level of 14, but a slightly low potassium  level of 3.3. Her cholesterol levels were normal, and her A1c was in the prediabetes range at 6.2. Thyroid  function was normal as of September.   She denies presence of coughing, SOB.     Past Medical History:  Diagnosis Date   Allergy    Anemia    takes Ferrous Sulfate  daily   Arthritis    GERD (gastroesophageal reflux disease)    takes OTC med   Heart murmur    Hyperlipidemia    Hypertension    takes Amlodipine  daily   Low back pain    occasionally    Medications: Outpatient Medications Prior to Visit  Medication Sig   albuterol  (VENTOLIN  HFA) 108 (90 Base) MCG/ACT inhaler Inhale 2 puffs into the lungs every 4 (four) hours as needed for wheezing or shortness of breath.   amLODipine  (NORVASC ) 10 MG tablet Take 1 tablet (10 mg total) by mouth daily. TAKE 1 TABLET BY MOUTH DAILY   atorvastatin  (LIPITOR) 40 MG tablet TAKE 1 TABLET BY MOUTH DAILY   azelastine  (OPTIVAR ) 0.05 % ophthalmic solution Place 1 drop into both eyes 2 (two) times daily. For up to 8 weeks   CALCIUM  PO    clopidogrel  (PLAVIX ) 75 MG tablet TAKE 1 TABLET BY MOUTH DAILY   conjugated estrogens  (PREMARIN ) vaginal cream Place 1 Applicatorful vaginally 2 (two) times a week. Place 0.5g nightly for two weeks then twice a  week after   cyclobenzaprine  (FLEXERIL ) 5 MG tablet Take 1 tablet (5 mg total) by mouth at bedtime.   Fish Oil-Cholecalciferol  (FISH OIL + D3 PO)    fluticasone  (FLONASE ) 50 MCG/ACT nasal spray Place 2 sprays into both nostrils daily.   metFORMIN  (GLUCOPHAGE -XR) 750 MG 24 hr tablet Take 1 tablet (750 mg total) by mouth 2 (two) times daily before a meal.   montelukast  (SINGULAIR ) 10 MG tablet Take 1 tablet (10 mg total) by mouth at bedtime.   ondansetron  (ZOFRAN ) 4 MG tablet Take 1 tablet (4 mg total) by mouth every 8 (eight) hours as needed for nausea or vomiting.   pantoprazole  (PROTONIX ) 40 MG tablet TAKE 1 TABLET BY MOUTH DAILY   Cholecalciferol  (VITAMIN D -3 PO)    conjugated estrogens  (PREMARIN )  vaginal cream Place 1 Applicatorful vaginally 2 (two) times a week. Place 0.5g nightly for two weeks then twice a week after   loratadine  (CLARITIN ) 10 MG tablet Take 1 tablet (10 mg total) by mouth daily.   Simethicone  125 MG CAPS    No facility-administered medications prior to visit.    Review of Systems  Last CBC Lab Results  Component Value Date   WBC 10.4 08/20/2023   HGB 14.0 08/20/2023   HCT 41.4 08/20/2023   MCV 83.6 08/20/2023   MCH 28.3 08/20/2023   RDW 13.1 08/20/2023   PLT 318 08/20/2023   Last metabolic panel Lab Results  Component Value Date   GLUCOSE 124 (H) 08/20/2023   NA 137 08/20/2023   K 3.3 (L) 08/20/2023   CL 101 08/20/2023   CO2 22 08/20/2023   BUN 11 08/20/2023   CREATININE 0.71 08/20/2023   GFRNONAA >60 08/20/2023   CALCIUM  9.1 08/20/2023   PROT 7.4 08/20/2023   ALBUMIN 3.9 08/20/2023   LABGLOB 2.4 04/06/2023   AGRATIO 1.5 05/19/2022   BILITOT 0.6 08/20/2023   ALKPHOS 71 08/20/2023   AST 16 08/20/2023   ALT 7 08/20/2023   ANIONGAP 14 08/20/2023   Last lipids Lab Results  Component Value Date   CHOL 149 04/06/2023   HDL 46 04/06/2023   LDLCALC 80 04/06/2023   TRIG 132 04/06/2023   CHOLHDL 3.2 04/06/2023   Last hemoglobin A1c Lab Results  Component Value Date   HGBA1C 6.2 (H) 07/06/2023   Last thyroid  functions Lab Results  Component Value Date   TSH 0.982 04/06/2023   Last vitamin D  Lab Results  Component Value Date   VD25OH 43.7 04/06/2023   Last vitamin B12 and Folate No results found for: "VITAMINB12", "FOLATE"      Objective    BP 133/60   Pulse (!) 56   Ht 5\' 5"  (1.651 m)   Wt 162 lb (73.5 kg)   SpO2 99%   BMI 26.96 kg/m   BP Readings from Last 3 Encounters:  11/14/23 133/60  10/06/23 117/65  10/04/23 123/66   Wt Readings from Last 3 Encounters:  11/14/23 162 lb (73.5 kg)  10/04/23 162 lb (73.5 kg)  09/07/23 163 lb 14.4 oz (74.3 kg)        Physical Exam  Physical Exam CHEST: Lungs clear to  auscultation, no wheezing. CARDIOVASCULAR: Regular rate and rhythm, no murmurs. NEUROLOGICAL: Cranial nerves II-XII intact. Normal strength in both arms.    No results found for any visits on 11/14/23.  Assessment & Plan     Problem List Items Addressed This Visit   None Visit Diagnoses       Dizziness    -  Primary   Relevant Orders   US  Carotid Duplex Bilateral       Assessment & Plan Dizziness New onset Dizziness upon standing or bending over, described as feeling weak and losing balance. Episodes last about a minute. No associated headaches or changes in hearing. Possible disequilibrium due to inner ear issues or carotid artery stenosis. No signs of vertigo or stroke based on neurological exam. - Order carotid ultrasound to assess for stenosis or blockage - Reduce amlodipine  dosage to 5 mg and monitor blood pressure - Instruct to monitor for symptoms such as weakness on one side or tingling and seek immediate care if she occurs - ordered updated CMP, TSH, CBC B12 and folate levels  - Schedule follow-up in one month     No follow-ups on file.       Mimi Alt, MD  [facility] [phone] (phone) [phone] (fax)  [facility]

## 2023-11-15 LAB — CBC
Hematocrit: 42.8 % (ref 34.0–46.6)
Hemoglobin: 13.6 g/dL (ref 11.1–15.9)
MCH: 28 pg (ref 26.6–33.0)
MCHC: 31.8 g/dL (ref 31.5–35.7)
MCV: 88 fL (ref 79–97)
Platelets: 329 10*3/uL (ref 150–450)
RBC: 4.86 x10E6/uL (ref 3.77–5.28)
RDW: 13.7 % (ref 11.7–15.4)
WBC: 7.4 10*3/uL (ref 3.4–10.8)

## 2023-11-15 LAB — CMP14+EGFR
ALT: 8 IU/L (ref 0–32)
AST: 13 IU/L (ref 0–40)
Albumin: 4.4 g/dL (ref 3.9–4.9)
Alkaline Phosphatase: 97 IU/L (ref 44–121)
BUN/Creatinine Ratio: 16 (ref 12–28)
BUN: 8 mg/dL (ref 8–27)
Bilirubin Total: 0.4 mg/dL (ref 0.0–1.2)
CO2: 27 mmol/L (ref 20–29)
Calcium: 9.7 mg/dL (ref 8.7–10.3)
Chloride: 99 mmol/L (ref 96–106)
Creatinine, Ser: 0.5 mg/dL — ABNORMAL LOW (ref 0.57–1.00)
Globulin, Total: 2.8 g/dL (ref 1.5–4.5)
Glucose: 94 mg/dL (ref 70–99)
Potassium: 3.8 mmol/L (ref 3.5–5.2)
Sodium: 142 mmol/L (ref 134–144)
Total Protein: 7.2 g/dL (ref 6.0–8.5)
eGFR: 103 mL/min/{1.73_m2} (ref >59)

## 2023-11-15 LAB — TSH+T4F+T3FREE
Free T4: 1.06 ng/dL (ref 0.82–1.77)
T3, Free: 3 pg/mL (ref 2.0–4.4)
TSH: 0.515 u[IU]/mL (ref 0.450–4.500)

## 2023-11-15 LAB — FOLATE: Folate: 7.8 ng/mL (ref >3.0)

## 2023-11-15 LAB — VITAMIN B12: Vitamin B-12: 444 pg/mL (ref 232–1245)

## 2023-11-16 ENCOUNTER — Telehealth: Payer: Self-pay | Admitting: Family Medicine

## 2023-11-16 DIAGNOSIS — J4530 Mild persistent asthma, uncomplicated: Secondary | ICD-10-CM

## 2023-11-16 MED ORDER — ALBUTEROL SULFATE HFA 108 (90 BASE) MCG/ACT IN AERS
1.0000 | INHALATION_SPRAY | Freq: Four times a day (QID) | RESPIRATORY_TRACT | 6 refills | Status: DC | PRN
Start: 2023-11-16 — End: 2023-12-04

## 2023-11-16 NOTE — Telephone Encounter (Signed)
 Albuterol  inhaler 18g with 6 refills sent to pharmacy, [phone] [facility]

## 2023-11-16 NOTE — Addendum Note (Signed)
 Addended by: Bart Lieu on: 11/16/2023 11:00 AM   Modules accepted: Orders

## 2023-11-16 NOTE — Telephone Encounter (Signed)
 [facility] is requesting refill albuterol  (VENTOLIN  HFA) 108 (90 Base) MCG/ACT inhaler  Please advise

## 2023-11-16 NOTE — Addendum Note (Signed)
 Addended by: SIMMONS-ROBINSON, Sohum Delillo L on: 11/16/2023 11:50 AM   Modules accepted: Orders

## 2023-11-16 NOTE — Telephone Encounter (Signed)
 Called and spoke to the pharmacy there was a mix up, a new script needs to be sent the ventolin  hfa is not covered she stated you can send the generic brand like pro air

## 2023-11-17 ENCOUNTER — Encounter: Payer: Self-pay | Admitting: Family Medicine

## 2023-11-22 ENCOUNTER — Ambulatory Visit
Admission: RE | Admit: 2023-11-22 | Discharge: 2023-11-22 | Disposition: A | Discharge status: Home / Self Care | Source: Ambulatory Visit | Attending: Family Medicine | Admitting: Family Medicine

## 2023-11-22 DIAGNOSIS — R42 Dizziness and giddiness: Secondary | ICD-10-CM | POA: Insufficient documentation

## 2023-11-24 ENCOUNTER — Encounter: Payer: Self-pay | Admitting: Family Medicine

## 2023-11-27 ENCOUNTER — Telehealth: Payer: Self-pay | Admitting: Family Medicine

## 2023-11-27 DIAGNOSIS — J4530 Mild persistent asthma, uncomplicated: Secondary | ICD-10-CM

## 2023-11-27 MED ORDER — MONTELUKAST SODIUM 10 MG PO TABS: 10.0000 mg | ORAL_TABLET | Freq: Every day | ORAL | 3 refills | Status: DC

## 2023-11-27 NOTE — Telephone Encounter (Signed)
 [facility] is requesting refill montelukast  (SINGULAIR ) 10 MG tablet   Please advise

## 2023-11-27 NOTE — Telephone Encounter (Signed)
 Rx has been sent to the pharmacy

## 2023-12-04 ENCOUNTER — Other Ambulatory Visit: Payer: Self-pay

## 2023-12-04 ENCOUNTER — Telehealth: Payer: Self-pay | Admitting: Family Medicine

## 2023-12-04 DIAGNOSIS — J4530 Mild persistent asthma, uncomplicated: Secondary | ICD-10-CM

## 2023-12-04 MED ORDER — ALBUTEROL SULFATE HFA 108 (90 BASE) MCG/ACT IN AERS: 1.0000 | INHALATION_SPRAY | Freq: Four times a day (QID) | RESPIRATORY_TRACT | 6 refills | Status: DC | PRN

## 2023-12-04 MED ORDER — ALBUTEROL SULFATE HFA 108 (90 BASE) MCG/ACT IN AERS
1.0000 | INHALATION_SPRAY | Freq: Four times a day (QID) | RESPIRATORY_TRACT | 6 refills | Status: DC | PRN
Start: 2023-12-04 — End: 2023-12-04

## 2023-12-04 NOTE — Addendum Note (Signed)
 Addended by: Bart Lieu on: 12/04/2023 11:45 AM   Modules accepted: Orders

## 2023-12-04 NOTE — Telephone Encounter (Signed)
 [facility] pharmacy faxed refill request for the following medications:  albuterol  (VENTOLIN  HFA) 108 (90 Base) MCG/ACT (PA)inhaler   Please advise

## 2023-12-05 ENCOUNTER — Telehealth: Payer: Self-pay | Admitting: Family Medicine

## 2023-12-05 NOTE — Telephone Encounter (Signed)
 [facility] is requesting refill albuterol  (VENTOLIN  HFA) 108 (90 Base) MCG/ACT inhaler  Please advise

## 2023-12-05 NOTE — Telephone Encounter (Signed)
 Rx has been sent to the pharmacy 5/12

## 2023-12-06 NOTE — Telephone Encounter (Signed)
 Please see the pt Bp readings

## 2023-12-13 NOTE — Telephone Encounter (Signed)
 Called and spoke to the pt, please disregard any more request for [facility] for this specific medication she wanted it to go [facility] because she needed it now and didn't want to wait for it to be mailed.

## 2023-12-13 NOTE — Telephone Encounter (Signed)
 Received a urgent fax from [facility] requesting this medication again.  Looks like it was sent to [facility].  Please send to correct pharmacy.

## 2023-12-19 ENCOUNTER — Telehealth: Payer: Self-pay | Admitting: Family Medicine

## 2023-12-19 NOTE — Telephone Encounter (Signed)
 [facility] is requesting refill montelukast  (SINGULAIR ) 10 MG tablet   Please advise

## 2023-12-21 ENCOUNTER — Telehealth: Payer: Self-pay | Admitting: Family Medicine

## 2023-12-21 DIAGNOSIS — E7800 Pure hypercholesterolemia, unspecified: Secondary | ICD-10-CM

## 2023-12-21 MED ORDER — ATORVASTATIN CALCIUM 40 MG PO TABS: 40.0000 mg | ORAL_TABLET | Freq: Every day | ORAL | 2 refills | Status: DC

## 2023-12-21 NOTE — Telephone Encounter (Signed)
 [facility] faxed refill request for the following medications:   atorvastatin  (LIPITOR) 40 MG tablet    amLODipine  (NORVASC ) 10 MG tablet   Pharmacy is requesting amLODipine  (NORVASC ) 10 MG tablet.  Not on current med list.  Looks like Dr. Verdia Glad reduced to amLODipine  (NORVASC ) 5 MG tablet at last ov.  Please advise.

## 2023-12-21 NOTE — Addendum Note (Signed)
 Addended by: Darrow End on: 12/21/2023 11:07 AM   Modules accepted: Orders

## 2023-12-21 NOTE — Telephone Encounter (Signed)
 Needs t ocontinue amlodipine  5mg  daily per PCP. Should have enough to get to her next appt with PCP

## 2023-12-25 ENCOUNTER — Telehealth: Payer: Self-pay | Admitting: Family Medicine

## 2023-12-25 NOTE — Telephone Encounter (Signed)
 [facility] is requesting refill amLODipine  (NORVASC ) 5 MG tablet   & atorvastatin  (LIPITOR) 40 MG tablet   Please advise

## 2024-01-05 ENCOUNTER — Encounter: Payer: Self-pay | Admitting: Family Medicine

## 2024-01-05 ENCOUNTER — Ambulatory Visit (INDEPENDENT_AMBULATORY_CARE_PROVIDER_SITE_OTHER): Payer: 59 | Admitting: Family Medicine

## 2024-01-05 VITALS — BP 135/78 | HR 88 | Ht 65.0 in | Wt 164.0 lb

## 2024-01-05 DIAGNOSIS — J4530 Mild persistent asthma, uncomplicated: Secondary | ICD-10-CM | POA: Diagnosis not present

## 2024-01-05 DIAGNOSIS — E7800 Pure hypercholesterolemia, unspecified: Secondary | ICD-10-CM | POA: Diagnosis not present

## 2024-01-05 DIAGNOSIS — M5441 Lumbago with sciatica, right side: Secondary | ICD-10-CM

## 2024-01-05 DIAGNOSIS — M546 Pain in thoracic spine: Secondary | ICD-10-CM | POA: Diagnosis not present

## 2024-01-05 DIAGNOSIS — R7303 Prediabetes: Secondary | ICD-10-CM | POA: Diagnosis not present

## 2024-01-05 DIAGNOSIS — K2100 Gastro-esophageal reflux disease with esophagitis, without bleeding: Secondary | ICD-10-CM | POA: Diagnosis not present

## 2024-01-05 DIAGNOSIS — G8929 Other chronic pain: Secondary | ICD-10-CM | POA: Diagnosis not present

## 2024-01-05 DIAGNOSIS — I10 Essential (primary) hypertension: Secondary | ICD-10-CM

## 2024-01-05 MED ORDER — TIZANIDINE HCL 4 MG PO TABS: 4.0000 mg | ORAL_TABLET | Freq: Three times a day (TID) | ORAL | 2 refills | Status: AC | PRN

## 2024-01-05 NOTE — Progress Notes (Signed)
 "     Established patient visit   Patient: [name]   DOB: 1955-12-28   67 y.o. Female  MRN: [id number] Visit Date: 01/05/2024  Today's healthcare provider: Rockie Agent, MD   Chief Complaint  Patient presents with   Diabetes   Hypertension   Subjective       Discussed the use of AI scribe software for clinical note transcription with the patient, who gave verbal consent to proceed.  History of Present Illness [name] is a 68 year old female who presents for routine follow-up and management of her chronic conditions.  Her diabetes is well-controlled with an A1c of 6.2. She is currently taking metformin  750 mg twice a day. She has not had a physical this year but has one scheduled for October.  Her blood pressure is stable, and she is taking amlodipine  5 mg daily. She also takes Lipitor 40 mg daily for cholesterol management and Plavix  75 mg daily.  She experiences sciatica, which has occurred twice, with the most recent episode two weeks ago. The pain radiates from her back down her leg, making it difficult to walk or move. She describes the pain as severe, stating 'if I lay down, it hurt. If I get up, it hurt.' The last episode before this was about a year ago. She has a history of back pain on the left side. No current back pain during the visit.  She is taking Protonix  40 mg once a day for reflux and Singulair  10 mg daily for allergies. She uses an therapist, occupational for her chronic medications and [facility] for new prescriptions.  She describes an incident where she experienced pain while walking to a farmer's market with her grandchild, which was triggered by standing for an extended period. She notes that the pain can be triggered by standing for extended periods. No shooting or electric-like pain, only experiencing stretching sensations.     Past Medical History:  Diagnosis Date   Allergy    Anemia    takes Ferrous Sulfate  daily   Arthritis     GERD (gastroesophageal reflux disease)    takes OTC med   Heart murmur    Hyperlipidemia    Hypertension    takes Amlodipine  daily   Low back pain    occasionally    Medications: Outpatient Medications Prior to Visit  Medication Sig   albuterol  (VENTOLIN  HFA) 108 (90 Base) MCG/ACT inhaler Inhale 1-2 puffs into the lungs every 6 (six) hours as needed for wheezing or shortness of breath.   amLODipine  (NORVASC ) 5 MG tablet Take 1 tablet (5 mg total) by mouth daily.   atorvastatin  (LIPITOR) 40 MG tablet Take 1 tablet (40 mg total) by mouth daily.   azelastine  (OPTIVAR ) 0.05 % ophthalmic solution Place 1 drop into both eyes 2 (two) times daily. For up to 8 weeks   CALCIUM  PO    clopidogrel  (PLAVIX ) 75 MG tablet TAKE 1 TABLET BY MOUTH DAILY   conjugated estrogens  (PREMARIN ) vaginal cream Place 1 Applicatorful vaginally 2 (two) times a week. Place 0.5g nightly for two weeks then twice a week after   cyclobenzaprine  (FLEXERIL ) 5 MG tablet Take 1 tablet (5 mg total) by mouth at bedtime.   Fish Oil-Cholecalciferol  (FISH OIL + D3 PO)    fluticasone  (FLONASE ) 50 MCG/ACT nasal spray Place 2 sprays into both nostrils daily.   loratadine  (CLARITIN ) 10 MG tablet Take 1 tablet (10 mg total) by mouth daily.   metFORMIN  (GLUCOPHAGE -XR) 750 MG  24 hr tablet Take 1 tablet (750 mg total) by mouth 2 (two) times daily before a meal.   montelukast  (SINGULAIR ) 10 MG tablet Take 1 tablet (10 mg total) by mouth at bedtime.   ondansetron  (ZOFRAN ) 4 MG tablet Take 1 tablet (4 mg total) by mouth every 8 (eight) hours as needed for nausea or vomiting.   pantoprazole  (PROTONIX ) 40 MG tablet TAKE 1 TABLET BY MOUTH DAILY   Simethicone  125 MG CAPS    No facility-administered medications prior to visit.    Review of Systems  Last metabolic panel Lab Results  Component Value Date   GLUCOSE 94 11/14/2023   NA 142 11/14/2023   K 3.8 11/14/2023   CL 99 11/14/2023   CO2 27 11/14/2023   BUN 8 11/14/2023   CREATININE  0.50 (L) 11/14/2023   EGFR 103 11/14/2023   CALCIUM  9.7 11/14/2023   PROT 7.2 11/14/2023   ALBUMIN 4.4 11/14/2023   LABGLOB 2.8 11/14/2023   AGRATIO 1.5 05/19/2022   BILITOT 0.4 11/14/2023   ALKPHOS 97 11/14/2023   AST 13 11/14/2023   ALT 8 11/14/2023   ANIONGAP 14 08/20/2023   Last hemoglobin A1c Lab Results  Component Value Date   HGBA1C 6.2 (H) 07/06/2023        Objective    BP 135/78   Pulse 88   Ht 5' 5 (1.651 m)   Wt 164 lb (74.4 kg)   SpO2 99%   BMI 27.29 kg/m  BP Readings from Last 3 Encounters:  01/05/24 135/78  11/14/23 133/60  10/06/23 117/65   Wt Readings from Last 3 Encounters:  01/05/24 164 lb (74.4 kg)  11/14/23 162 lb (73.5 kg)  10/04/23 162 lb (73.5 kg)        Physical Exam  Physical Exam MUSCULOSKELETAL: No shooting pain, stretching sensation on spine examination. Spine range of motion is normal  CARD:RRR PULM: CTAB     No results found for any visits on 01/05/24.  Assessment & Plan     Problem List Items Addressed This Visit       Cardiovascular and Mediastinum   Primary hypertension - Primary   Controlled BP borderline, goal less than 130/80 in clinic but reports normal home BP measurements  Will have her continue amlodipine  5mg  daily          Respiratory   Mild persistent asthma without complication   Chronic  Stable  No symptoms today  She will continue albuterol  PRN and singulair  10mg  daily         Digestive   Gastroesophageal reflux disease with esophagitis without hemorrhage   Chronic, stable Continue protonix  40 mg         Other   Prediabetes   Pr- diabetes is well-controlled with a recent A1c of 6.2%. She is on metformin  750 mg twice daily. Plan to check A1c during this visit to ensure continued control. - Check A1c level, unable to run sample today, will plan to repeat with lab draw at CPE      Relevant Orders   POCT HgB A1C   Hypercholesteremia   Chronic On atorvastatin  40 mg for cholesterol  management. -Continue current regimen.      Chronic left-sided thoracic back pain   Sciatica Chronic  Intermittent  Now involving the right side  Recurrent sciatica with episodes approximately a year apart. Recent episode two weeks ago with pain radiating from the back down the leg, causing significant discomfort and mobility issues. Symptoms have resolved. Discussed potential  imaging to rule out disc issues or nerve compression. She prefers a lumbar x-ray for baseline assessment and is aware of the option to consult Dr. Onetha if symptoms recur. - Order lumbar x-ray - Prescribe tizanidine  4 mg every 6 hours as needed for muscle spasms - Advise follow-up with Dr. Onetha if sciatica symptoms recur      Relevant Medications   tiZANidine  (ZANAFLEX ) 4 MG tablet   Other Visit Diagnoses       Acute right-sided low back pain with right-sided sciatica       Relevant Medications   tiZANidine  (ZANAFLEX ) 4 MG tablet   Other Relevant Orders   DG Lumbar Spine Complete        Assessment & Plan    General Health Maintenance Next physical is scheduled for October. Discussed the importance of regular health maintenance visits.  Follow-up Discussed follow-up plans including a phone visit in November and an in-person visit in December for a physical. - Schedule phone visit in November - Schedule in-person visit in December for physical     Return in about 6 months (around 07/06/2024) for CPE.         Rockie Agent, MD  [facility] [phone] (phone) [phone] (fax)  [facility] "

## 2024-01-07 NOTE — Assessment & Plan Note (Signed)
 Chronic On atorvastatin  40 mg for cholesterol management. -Continue current regimen.

## 2024-01-07 NOTE — Assessment & Plan Note (Signed)
 Pr- diabetes is well-controlled with a recent A1c of 6.2%. She is on metformin  750 mg twice daily. Plan to check A1c during this visit to ensure continued control. - Check A1c level, unable to run sample today, will plan to repeat with lab draw at CPE

## 2024-01-07 NOTE — Assessment & Plan Note (Signed)
 Chronic  Stable  No symptoms today  She will continue albuterol  PRN and singulair  10mg  daily

## 2024-01-07 NOTE — Assessment & Plan Note (Signed)
 Sciatica Chronic  Intermittent  Now involving the right side  Recurrent sciatica with episodes approximately a year apart. Recent episode two weeks ago with pain radiating from the back down the leg, causing significant discomfort and mobility issues. Symptoms have resolved. Discussed potential imaging to rule out disc issues or nerve compression. She prefers a lumbar x-ray for baseline assessment and is aware of the option to consult Dr. Lamon Pillow if symptoms recur. - Order lumbar x-ray - Prescribe tizanidine 4 mg every 6 hours as needed for muscle spasms - Advise follow-up with Dr. Lamon Pillow if sciatica symptoms recur

## 2024-01-07 NOTE — Assessment & Plan Note (Signed)
Chronic, stable Continue protonix 40 mg

## 2024-01-07 NOTE — Assessment & Plan Note (Signed)
 Controlled BP borderline, goal less than 130/80 in clinic but reports normal home BP measurements  Will have her continue amlodipine  5mg  daily

## 2024-01-08 ENCOUNTER — Other Ambulatory Visit: Payer: Self-pay

## 2024-01-08 DIAGNOSIS — E7800 Pure hypercholesterolemia, unspecified: Secondary | ICD-10-CM

## 2024-01-08 MED ORDER — AMLODIPINE BESYLATE 5 MG PO TABS: 5.0000 mg | ORAL_TABLET | Freq: Every day | ORAL | 0 refills | Status: DC

## 2024-01-08 MED ORDER — ATORVASTATIN CALCIUM 40 MG PO TABS: 40.0000 mg | ORAL_TABLET | Freq: Every day | ORAL | 2 refills | Status: DC

## 2024-01-08 NOTE — Telephone Encounter (Signed)
 Attempted to contact the pt, the mailbox was full, if she returns the call please clarify where she would like the requested medication to be sent. [facility] or [facility] mail service?

## 2024-01-08 NOTE — Telephone Encounter (Signed)
 2nd Request-[facility] faxed refill request for the following medications:  atorvastatin  (LIPITOR) 40 MG tablet   amLODipine  (NORVASC ) 10 MG tablet /Correction on MG  Please advise

## 2024-01-09 MED ORDER — AMLODIPINE BESYLATE 5 MG PO TABS: 5.0000 mg | ORAL_TABLET | Freq: Every day | ORAL | 2 refills | Status: DC

## 2024-01-09 NOTE — Addendum Note (Signed)
 Addended by: SIMMONS-ROBINSON, Shakyia Bosso L on: 01/09/2024 11:47 AM   Modules accepted: Orders

## 2024-01-10 ENCOUNTER — Telehealth: Payer: Self-pay | Admitting: Family Medicine

## 2024-01-10 ENCOUNTER — Other Ambulatory Visit: Payer: Self-pay

## 2024-01-10 DIAGNOSIS — E7800 Pure hypercholesterolemia, unspecified: Secondary | ICD-10-CM

## 2024-01-10 MED ORDER — AMLODIPINE BESYLATE 5 MG PO TABS: 5.0000 mg | ORAL_TABLET | Freq: Every day | ORAL | 2 refills | Status: DC

## 2024-01-10 MED ORDER — ATORVASTATIN CALCIUM 40 MG PO TABS: 40.0000 mg | ORAL_TABLET | Freq: Every day | ORAL | 2 refills | Status: DC

## 2024-01-10 NOTE — Telephone Encounter (Signed)
Converted to refill request 

## 2024-01-10 NOTE — Telephone Encounter (Signed)
 [facility] is requesting refill amLODipine  (NORVASC ) 5 MG tablet   & atorvastatin  (LIPITOR) 40 MG tablet   Please advise

## 2024-01-15 DIAGNOSIS — R7303 Prediabetes: Secondary | ICD-10-CM | POA: Diagnosis not present

## 2024-01-16 ENCOUNTER — Ambulatory Visit: Payer: Self-pay | Admitting: Family Medicine

## 2024-01-16 LAB — HEMOGLOBIN A1C
Est. average glucose Bld gHb Est-mCnc: 126 mg/dL
Hgb A1c MFr Bld: 6 % — ABNORMAL HIGH (ref 4.8–5.6)

## 2024-02-07 ENCOUNTER — Other Ambulatory Visit: Payer: Self-pay | Admitting: Family Medicine

## 2024-02-07 DIAGNOSIS — J4530 Mild persistent asthma, uncomplicated: Secondary | ICD-10-CM

## 2024-02-16 ENCOUNTER — Telehealth: Payer: Self-pay | Admitting: Family Medicine

## 2024-02-16 ENCOUNTER — Other Ambulatory Visit: Payer: Self-pay

## 2024-02-16 MED ORDER — AMLODIPINE BESYLATE 5 MG PO TABS: 5.0000 mg | ORAL_TABLET | Freq: Every day | ORAL | 2 refills | Status: DC

## 2024-02-16 NOTE — Telephone Encounter (Signed)
 Amlodipine  10 mg. was sent to wrong pharmacy ([facility])......   [facility] is asking for refills of this #90 with 3 RF.

## 2024-02-16 NOTE — Telephone Encounter (Signed)
 Done

## 2024-02-19 ENCOUNTER — Telehealth: Payer: Self-pay | Admitting: Family Medicine

## 2024-02-19 NOTE — Telephone Encounter (Signed)
 [facility] is requesting refills on Pantoprazole  40 mg. #100 with 3 RF

## 2024-02-20 ENCOUNTER — Other Ambulatory Visit: Payer: Self-pay

## 2024-02-20 MED ORDER — PANTOPRAZOLE SODIUM 40 MG PO TBEC: 40.0000 mg | DELAYED_RELEASE_TABLET | Freq: Every day | ORAL | 2 refills | Status: AC

## 2024-02-20 NOTE — Telephone Encounter (Signed)
 Converted into a refill request

## 2024-02-26 ENCOUNTER — Telehealth: Payer: Self-pay | Admitting: Family Medicine

## 2024-02-26 ENCOUNTER — Other Ambulatory Visit: Payer: Self-pay

## 2024-02-26 DIAGNOSIS — E7800 Pure hypercholesterolemia, unspecified: Secondary | ICD-10-CM

## 2024-02-26 MED ORDER — ATORVASTATIN CALCIUM 40 MG PO TABS: 40.0000 mg | ORAL_TABLET | Freq: Every day | ORAL | 2 refills | Status: AC

## 2024-02-26 NOTE — Telephone Encounter (Signed)
[facility] faxed refill request for the following medications:  atorvastatin (LIPITOR) 40 MG tablet    Please advise.

## 2024-03-06 ENCOUNTER — Other Ambulatory Visit: Payer: Self-pay

## 2024-03-06 ENCOUNTER — Telehealth: Payer: Self-pay | Admitting: Family Medicine

## 2024-03-06 NOTE — Telephone Encounter (Signed)
 [facility] faxed refill request for the following medications:    amLODipine  (NORVASC ) 5 MG tablet   Please advise

## 2024-03-06 NOTE — Telephone Encounter (Signed)
Converted to refill request 

## 2024-03-11 ENCOUNTER — Other Ambulatory Visit: Payer: Self-pay

## 2024-03-11 MED ORDER — AMLODIPINE BESYLATE 5 MG PO TABS: 5.0000 mg | ORAL_TABLET | Freq: Every day | ORAL | 2 refills | Status: AC

## 2024-03-11 NOTE — Telephone Encounter (Signed)
 2nd Request- [facility] faxed refill request for the following medications:   amLODipine  (NORVASC ) 5 MG tablet   Please advise

## 2024-03-11 NOTE — Telephone Encounter (Signed)
 First request was denied because last refill was 02/16/24 90 x 3.  Have sent in another Rx since they don't seem to have the last request

## 2024-03-13 ENCOUNTER — Other Ambulatory Visit: Payer: Self-pay

## 2024-03-13 ENCOUNTER — Telehealth: Payer: Self-pay | Admitting: Family Medicine

## 2024-03-13 DIAGNOSIS — J4530 Mild persistent asthma, uncomplicated: Secondary | ICD-10-CM

## 2024-03-13 DIAGNOSIS — G8929 Other chronic pain: Secondary | ICD-10-CM

## 2024-03-13 MED ORDER — MONTELUKAST SODIUM 10 MG PO TABS: 10.0000 mg | ORAL_TABLET | Freq: Every day | ORAL | 3 refills | Status: AC

## 2024-03-13 NOTE — Telephone Encounter (Signed)
 LOV 01/05/24 NOV 05/28/24 (annual wellness) LRF 04/06/23 qty:90 r:3

## 2024-03-13 NOTE — Telephone Encounter (Signed)
 [facility] faxed refill request for the following medications:   montelukast  (SINGULAIR ) 10 MG tablet   cyclobenzaprine  (FLEXERIL ) 5 MG tablet    Please advise

## 2024-03-13 NOTE — Telephone Encounter (Signed)
Converted to refill req

## 2024-03-15 ENCOUNTER — Other Ambulatory Visit: Payer: Self-pay

## 2024-03-15 NOTE — Progress Notes (Signed)
 Pharmacy Quality Measure Review  This patient is appearing on a report for being at risk of failing the adherence measure for diabetes medications this calendar year.   Medication: metformin  750 mg XR Last fill date: 12/13/23 for 90 day supply  Will collaborate with provider to facilitate refill needs.  Quartez Lagos E. Marsh, PharmD Clinical Pharmacist [facility] [phone]

## 2024-03-18 MED ORDER — METFORMIN HCL ER 750 MG PO TB24: 750.0000 mg | ORAL_TABLET | Freq: Two times a day (BID) | ORAL | 3 refills | Status: DC

## 2024-03-19 ENCOUNTER — Other Ambulatory Visit: Payer: Self-pay | Admitting: Family Medicine

## 2024-03-19 NOTE — Telephone Encounter (Signed)
 Copied from CRM [id number]. Topic: Clinical - Medication Refill >> Mar 19, 2024  3:21 PM Marissa P wrote: Medication: amLODipine  (NORVASC ) 5 MG tablet  Has the patient contacted their pharmacy? Yes (Agent: If no, request that the patient contact the pharmacy for the refill. If patient does not wish to contact the pharmacy document the reason why and proceed with request.) (Agent: If yes, when and what did the pharmacy advise?)  This is the patient's preferred pharmacy:  [facility]  [address] [place] [address]   Is this the correct pharmacy for this prescription? Yes If no, delete pharmacy and type the correct one.   Has the prescription been filled recently? Yes  Is the patient out of the medication? Yes  Has the patient been seen for an appointment in the last year OR does the patient have an upcoming appointment? Yes  Can we respond through MyChart? Yes  Agent: Please be advised that Rx refills may take up to 3 business days. We ask that you follow-up with your pharmacy.

## 2024-03-27 ENCOUNTER — Other Ambulatory Visit: Payer: Self-pay

## 2024-03-27 ENCOUNTER — Telehealth: Payer: Self-pay | Admitting: Family Medicine

## 2024-03-27 DIAGNOSIS — G8929 Other chronic pain: Secondary | ICD-10-CM

## 2024-03-27 NOTE — Telephone Encounter (Signed)
 Converted into a refill request

## 2024-03-27 NOTE — Telephone Encounter (Signed)
 [facility] faxed refill request for the following medications:  cyclobenzaprine  (FLEXERIL ) 5 MG tablet    Please advise.

## 2024-03-29 ENCOUNTER — Other Ambulatory Visit: Payer: Self-pay | Admitting: Family Medicine

## 2024-03-29 ENCOUNTER — Telehealth: Payer: Self-pay | Admitting: Family Medicine

## 2024-03-29 NOTE — Telephone Encounter (Signed)
 Please see the encounter on 9/3 this request has already been sent

## 2024-03-29 NOTE — Telephone Encounter (Signed)
 [facility] faxed refill request for the following medications:  cyclobenzaprine  (FLEXERIL ) 5 MG tablet    Please advise.

## 2024-03-29 NOTE — Telephone Encounter (Signed)
 Denied. Switched to zanaflex  during last OV with new PCP

## 2024-05-23 ENCOUNTER — Emergency Department
Admission: EM | Admit: 2024-05-23 | Discharge: 2024-05-23 | Disposition: A | Discharge status: Home / Self Care | Attending: Emergency Medicine | Admitting: Emergency Medicine

## 2024-05-23 ENCOUNTER — Emergency Department

## 2024-05-23 ENCOUNTER — Other Ambulatory Visit: Payer: Self-pay

## 2024-05-23 DIAGNOSIS — M25511 Pain in right shoulder: Secondary | ICD-10-CM | POA: Insufficient documentation

## 2024-05-23 DIAGNOSIS — I10 Essential (primary) hypertension: Secondary | ICD-10-CM | POA: Diagnosis not present

## 2024-05-23 DIAGNOSIS — M19011 Primary osteoarthritis, right shoulder: Secondary | ICD-10-CM | POA: Diagnosis not present

## 2024-05-23 DIAGNOSIS — J4530 Mild persistent asthma, uncomplicated: Secondary | ICD-10-CM | POA: Diagnosis not present

## 2024-05-23 MED ORDER — OXYCODONE HCL 5 MG PO TABS
5.0000 mg | ORAL_TABLET | Freq: Once | ORAL | Status: AC
  Administered 2024-05-23: 5 mg via ORAL
  Filled 2024-05-23: qty 1

## 2024-05-23 MED ORDER — ACETAMINOPHEN 325 MG PO TABS
650.0000 mg | ORAL_TABLET | Freq: Once | ORAL | Status: AC
  Administered 2024-05-23: 650 mg via ORAL
  Filled 2024-05-23: qty 2

## 2024-05-23 NOTE — ED Triage Notes (Signed)
 Patient states right shoulder pain that started last night after picking up and bathing dog.

## 2024-05-23 NOTE — ED Notes (Signed)
Patient states her daughter is coming to pick her up.

## 2024-05-23 NOTE — ED Provider Notes (Signed)
 [facility] Provider Note    Event Date/Time   First MD Initiated Contact with Patient 05/23/24 1147     (approximate)   History   Shoulder Pain   HPI  [name] is a 68 y.o. female who presents today for evaluation of right shoulder pain.  Patient reports that she picked up and bathed her dog and then developed pain in her right shoulder.  She reports that it does not radiate.  It is worsened with range of motion.  No chest pain or shortness of breath.  No numbness or tingling or weakness.  No neck pain.  Patient Active Problem List   Diagnosis Date Noted   Cervical pain (neck) 09/07/2023   History of nausea and vomiting 09/07/2023   History of recurrent UTI (urinary tract infection) 06/26/2023   Nocturia 06/26/2023   SUI (stress urinary incontinence, female) 06/26/2023   Suspected UTI 04/20/2023   Frequency of urination 04/20/2023   Abnormal urinalysis 04/20/2023   Leukocytosis 04/20/2023   Pelvic organ prolapse quantification stage 2 cystocele 04/20/2023   Primary hypertension 04/06/2023   Overweight with body mass index (BMI) of 27 to 27.9 in adult 04/06/2023   Mild persistent asthma without complication 04/06/2023   Gastroesophageal reflux disease with esophagitis without hemorrhage 08/15/2022   Hepatic cyst 08/03/2022   Chronic left-sided thoracic back pain 07/28/2022   Osteopenia of multiple sites 07/28/2022   Prediabetes 05/08/2020   Hypercholesteremia 01/06/2015          Physical Exam   Triage Vital Signs: ED Triage Vitals  Encounter Vitals Group     BP 05/23/24 1139 (!) 173/79     Girls Systolic BP Percentile --      Girls Diastolic BP Percentile --      Boys Systolic BP Percentile --      Boys Diastolic BP Percentile --      Pulse Rate 05/23/24 1139 80     Resp 05/23/24 1139 18     Temp 05/23/24 1139 98.3 F (36.8 C)     Temp Source 05/23/24 1139 Oral     SpO2 05/23/24 1139 96 %     Weight 05/23/24 1138 156 lb  (70.8 kg)     Height 05/23/24 1138 5' 5 (1.651 m)     Head Circumference --      Peak Flow --      Pain Score 05/23/24 1138 10     Pain Loc --      Pain Education --      Exclude from Growth Chart --     Most recent vital signs: Vitals:   05/23/24 1139 05/23/24 1302  BP: (!) 173/79 (!) 158/81  Pulse: 80 67  Resp: 18 16  Temp: 98.3 F (36.8 C) 98.2 F (36.8 C)  SpO2: 96% 97%    Physical Exam Vitals and nursing note reviewed.  Constitutional:      General: Awake and alert. No acute distress.    Appearance: Normal appearance. The patient is normal weight.  HENT:     Head: Normocephalic and atraumatic.     Mouth: Mucous membranes are moist.  Eyes:     General: PERRL. Normal EOMs        Right eye: No discharge.        Left eye: No discharge.     Conjunctiva/sclera: Conjunctivae normal.  Cardiovascular:     Rate and Rhythm: Normal rate.     Pulses: Normal pulses.  Pulmonary:  Effort: Pulmonary effort is normal. No respiratory distress.     Breath sounds: Normal breath sounds.  Abdominal:     Abdomen is soft. There is no abdominal tenderness. No rebound or guarding. No distention. Musculoskeletal:        General: No swelling. Normal range of motion.     Cervical back: Normal range of motion and neck supple. No midline cervical spine tenderness.  Full range of motion of neck.  Negative Spurling test.  Negative Lhermitte sign.  Normal strength and sensation in bilateral upper extremities. Normal grip strength bilaterally.  Normal intrinsic muscle function of the hand bilaterally.  Normal radial pulses bilaterally. Right shoulder: No obvious deformity, swelling, ecchymosis, or erythema Tenderness to the [facility] joint and the anterior and lateral shoulder joint line Able to actively and passively forward flex and abduct at shoulder fully, though pain with doing so, negative drop arm test Pain with Obriens, SLAP, empty can, and lift off tests Normal internal and external rotation  against resistance Pain with Hawkins and Neers Normal ROM at elbow and wrist Normal resisted pronation and supination 2+ radial pulse Normal grip strength Normal intrinsic hand muscle function Skin:    General: Skin is warm and dry.     Capillary Refill: Capillary refill takes less than 2 seconds.     Findings: No rash.  Neurological:     Mental Status: The patient is awake and alert.      ED Results / Procedures / Treatments   Labs (all labs ordered are listed, but only abnormal results are displayed) Labs Reviewed - No data to display   EKG     RADIOLOGY I independently reviewed and interpreted imaging and agree with radiologists findings.     PROCEDURES:  Critical Care performed:   Procedures   MEDICATIONS ORDERED IN ED: Medications  oxyCODONE  (Oxy IR/ROXICODONE ) immediate release tablet 5 mg (5 mg Oral Given 05/23/24 1213)  acetaminophen  (TYLENOL ) tablet 650 mg (650 mg Oral Given 05/23/24 1213)     IMPRESSION / MDM / ASSESSMENT AND PLAN / ED COURSE  I reviewed the triage vital signs and the nursing notes.   Differential diagnosis includes, but is not limited to, rotator cuff injury, calcific tendinitis, less likely cervical radiculopathy.  Patient is awake and alert, hemodynamically stable and afebrile.  She is nontoxic in appearance.  She has significant pain with any range of motion of her right shoulder.  However, she has normal grip strength, normal intrinsic muscle function of the hand, normal radial pulse.  Negative drop arm test, do not suspect complete rotator cuff tear.  X-ray obtained reveals AC joint osteoarthritis.  I did recommend that she follow-up with orthopedics for further evaluation.  She has no cervical spine tenderness, normal strength and sensation throughout her arm, normal grip strength, normal intrinsic muscle function of the hand, do not suspect cervical etiology.  She was treated symptomatically with oxycodone  and Tylenol  given her  allergy to NSAIDs.  This provided significant improvement.  We discussed return precautions and follow-up with orthopedic surgery.  The appropriate follow-up information was provided.  We discussed return precautions in the meantime.  Patient understands and agrees with plan.  She was discharged in stable condition.   Patient's presentation is most consistent with acute complicated illness / injury requiring diagnostic workup.    FINAL CLINICAL IMPRESSION(S) / ED DIAGNOSES   Final diagnoses:  Acute pain of right shoulder     Rx / DC Orders   ED Discharge Orders  None        Note:  This document was prepared using Dragon voice recognition software and may include unintentional dictation errors.   Chela Sutphen E, PA-C 05/23/24 1332    Dorothyann Drivers, MD 05/23/24 1512

## 2024-05-23 NOTE — ED Notes (Signed)
 Patient states daughter is coming to pick her up.

## 2024-05-23 NOTE — Discharge Instructions (Signed)
 You may follow-up with orthopedics.  Please return for any new, worsening, or changing symptoms or other concerns.  It was a pleasure caring for you today.

## 2024-05-28 ENCOUNTER — Other Ambulatory Visit: Payer: Self-pay | Admitting: Obstetrics

## 2024-05-28 DIAGNOSIS — Z87440 Personal history of urinary (tract) infections: Secondary | ICD-10-CM

## 2024-06-07 ENCOUNTER — Other Ambulatory Visit: Payer: Self-pay | Admitting: Family Medicine

## 2024-06-07 DIAGNOSIS — Z1231 Encounter for screening mammogram for malignant neoplasm of breast: Secondary | ICD-10-CM

## 2024-06-21 ENCOUNTER — Telehealth: Payer: Self-pay

## 2024-07-02 ENCOUNTER — Other Ambulatory Visit: Payer: Self-pay | Admitting: Family Medicine

## 2024-07-02 NOTE — Telephone Encounter (Signed)
 Requesting: clopidogrel  (PLAVIX ) 75 MG tablet  Last Visit: 01/05/2024 Next Visit: Visit date not scheduled  Last Refill: 07/11/2023 #100 x 3rf   Please Advise

## 2024-07-02 NOTE — Telephone Encounter (Signed)
 [facility] faxed refill request for the following medications:  clopidogrel  (PLAVIX ) 75 MG tablet     Please advise.

## 2024-07-03 MED ORDER — CLOPIDOGREL BISULFATE 75 MG PO TABS: 75.0000 mg | ORAL_TABLET | Freq: Every day | ORAL | 3 refills | Status: AC

## 2024-07-24 ENCOUNTER — Other Ambulatory Visit: Payer: Self-pay

## 2024-07-24 DIAGNOSIS — E876 Hypokalemia: Secondary | ICD-10-CM | POA: Insufficient documentation

## 2024-07-24 DIAGNOSIS — R059 Cough, unspecified: Secondary | ICD-10-CM | POA: Diagnosis present

## 2024-07-24 DIAGNOSIS — I10 Essential (primary) hypertension: Secondary | ICD-10-CM | POA: Insufficient documentation

## 2024-07-24 DIAGNOSIS — J101 Influenza due to other identified influenza virus with other respiratory manifestations: Secondary | ICD-10-CM | POA: Insufficient documentation

## 2024-07-24 LAB — URINALYSIS, ROUTINE W REFLEX MICROSCOPIC
Bilirubin Urine: NEGATIVE
Glucose, UA: NEGATIVE mg/dL
Hgb urine dipstick: NEGATIVE
Ketones, ur: NEGATIVE mg/dL
Leukocytes,Ua: NEGATIVE
Nitrite: NEGATIVE
Protein, ur: NEGATIVE mg/dL
Specific Gravity, Urine: 1.016 (ref 1.005–1.030)
pH: 6 (ref 5.0–8.0)

## 2024-07-24 LAB — CBC
HCT: 40.4 % (ref 36.0–46.0)
Hemoglobin: 13.2 g/dL (ref 12.0–15.0)
MCH: 28.2 pg (ref 26.0–34.0)
MCHC: 32.7 g/dL (ref 30.0–36.0)
MCV: 86.3 fL (ref 80.0–100.0)
Platelets: 285 K/uL (ref 150–400)
RBC: 4.68 MIL/uL (ref 3.87–5.11)
RDW: 12.9 % (ref 11.5–15.5)
WBC: 5.5 K/uL (ref 4.0–10.5)
nRBC: 0 % (ref 0.0–0.2)

## 2024-07-24 NOTE — ED Triage Notes (Signed)
 Pt presents for nausea and general malaise. Endorsing tactile fever but afebrile in triage. Denies diarrhea or pain.

## 2024-07-25 ENCOUNTER — Emergency Department
Admission: EM | Admit: 2024-07-25 | Discharge: 2024-07-25 | Disposition: A | Discharge status: Home / Self Care | Attending: Emergency Medicine | Admitting: Emergency Medicine

## 2024-07-25 ENCOUNTER — Emergency Department

## 2024-07-25 DIAGNOSIS — J101 Influenza due to other identified influenza virus with other respiratory manifestations: Secondary | ICD-10-CM

## 2024-07-25 LAB — COMPREHENSIVE METABOLIC PANEL WITH GFR
ALT: <5 U/L (ref 0–44)
AST: 20 U/L (ref 15–41)
Albumin: 4 g/dL (ref 3.5–5.0)
Alkaline Phosphatase: 80 U/L (ref 38–126)
Anion gap: 12 (ref 5–15)
BUN: 12 mg/dL (ref 8–23)
CO2: 27 mmol/L (ref 22–32)
Calcium: 9.5 mg/dL (ref 8.9–10.3)
Chloride: 101 mmol/L (ref 98–111)
Creatinine, Ser: 0.54 mg/dL (ref 0.44–1.00)
GFR, Estimated: >60 mL/min
Glucose, Bld: 114 mg/dL — ABNORMAL HIGH (ref 70–99)
Potassium: 3.2 mmol/L — ABNORMAL LOW (ref 3.5–5.1)
Sodium: 139 mmol/L (ref 135–145)
Total Bilirubin: 0.2 mg/dL (ref 0.0–1.2)
Total Protein: 7.3 g/dL (ref 6.5–8.1)

## 2024-07-25 LAB — LIPASE, BLOOD: Lipase: 21 U/L (ref 11–51)

## 2024-07-25 LAB — TROPONIN T, HIGH SENSITIVITY: Troponin T High Sensitivity: <15 ng/L (ref 0–19)

## 2024-07-25 LAB — RESP PANEL BY RT-PCR (RSV, FLU A&B, COVID)  RVPGX2
Influenza A by PCR: POSITIVE — AB
Influenza B by PCR: NEGATIVE
Resp Syncytial Virus by PCR: NEGATIVE
SARS Coronavirus 2 by RT PCR: NEGATIVE

## 2024-07-25 MED ORDER — ONDANSETRON 4 MG PO TBDP: 4.0000 mg | ORAL_TABLET | Freq: Three times a day (TID) | ORAL | 0 refills | Status: AC | PRN

## 2024-07-25 MED ORDER — OSELTAMIVIR PHOSPHATE 75 MG PO CAPS: 75.0000 mg | ORAL_CAPSULE | Freq: Two times a day (BID) | ORAL | 0 refills | Status: AC

## 2024-07-25 NOTE — ED Provider Notes (Signed)
 "  [facility] Provider Note    Event Date/Time   First MD Initiated Contact with Patient 07/25/24 0006     (approximate)   History   Nausea   HPI  [name] is a 69 y.o. female   Past medical history of GERD, hypertension, hyperlipidemia, here with few days of productive cough, congestion, myalgias and generalized fatigue since Saturday.  She has had subjective fevers and chills as well.  No GI symptoms aside from nausea but no vomiting.  Bowel movements normal.  No dysuria or frequency.  No known sick contacts.   She denies chest pain or shortness of breath.   External Medical Documents Reviewed: Previous outpatient notes      Physical Exam   Triage Vital Signs: ED Triage Vitals  Encounter Vitals Group     BP 07/24/24 2309 (!) 160/80     Girls Systolic BP Percentile --      Girls Diastolic BP Percentile --      Boys Systolic BP Percentile --      Boys Diastolic BP Percentile --      Pulse Rate 07/24/24 2309 66     Resp 07/24/24 2309 18     Temp 07/24/24 2309 98.1 F (36.7 C)     Temp Source 07/24/24 2309 Oral     SpO2 07/24/24 2309 97 %     Weight --      Height --      Head Circumference --      Peak Flow --      Pain Score 07/24/24 2310 0     Pain Loc --      Pain Education --      Exclude from Growth Chart --     Most recent vital signs: Vitals:   07/24/24 2309 07/25/24 0117  BP: (!) 160/80   Pulse: 66   Resp: 18   Temp: 98.1 F (36.7 C)   SpO2: 97% 97%    General: Awake, no distress.  CV:  Good peripheral perfusion.  Resp:  Normal effort.  Abd:  No distention.  Other:  Well-appearing no acute distress slightly hypertensive otherwise vital signs normal.  No hypoxemia breathing comfortably.  Clear lungs without focality or wheezing.  Neck supple full range of motion.  Benign abdominal exam.   ED Results / Procedures / Treatments   Labs (all labs ordered are listed, but only abnormal results are displayed) Labs  Reviewed  RESP PANEL BY RT-PCR (RSV, FLU A&B, COVID)  RVPGX2 - Abnormal; Notable for the following components:      Result Value   Influenza A by PCR POSITIVE (*)    All other components within normal limits  COMPREHENSIVE METABOLIC PANEL WITH GFR - Abnormal; Notable for the following components:   Potassium 3.2 (*)    Glucose, Bld 114 (*)    All other components within normal limits  URINALYSIS, ROUTINE W REFLEX MICROSCOPIC - Abnormal; Notable for the following components:   Color, Urine STRAW (*)    APPearance CLEAR (*)    All other components within normal limits  LIPASE, BLOOD  CBC  TROPONIN T, HIGH SENSITIVITY     I ordered and reviewed the above labs they are notable for positive for influenza A, otherwise cell counts electrolytes urinalysis unremarkable.  Initial troponin negative.  EKG  ED ECG REPORT I, Ginnie Shams, the attending physician, personally viewed and interpreted this ECG.   Date: 07/25/2024  EKG Time: 0022  Rate:  57  Rhythm: sinus  Axis: nl  Intervals:nl  ST&T Change: no stemi    RADIOLOGY I independently reviewed and interpreted chest x-ray and I see no obvious focality pneumothorax I also reviewed radiologist's formal read.   PROCEDURES:  Critical Care performed: No  Procedures   MEDICATIONS ORDERED IN ED: Medications - No data to display   IMPRESSION / MDM / ASSESSMENT AND PLAN / ED COURSE  I reviewed the triage vital signs and the nursing notes.                                Patient's presentation is most consistent with acute presentation with potential threat to life or bodily function.  Differential diagnosis includes, but is not limited to, influenza or other viral URI, bacterial pneumonia, ACS, considered but less likely sepsis or meningitis   MDM:    Several days of viral URI symptoms and heart rates of influenza in the community suspicious for this and indeed she tested positive.  Otherwise looks well, doubt serious  bacterial infections like sepsis meningitis or bacterial pneumonia given clinical evaluation as above.    I considered hospitalization for admission or observation however given ability to perform activities of daily living no hypoxemia no respiratory distress and appropriate for discharge.  Prescriptions obtained today, anticipatory guidance, and return precautions given.        FINAL CLINICAL IMPRESSION(S) / ED DIAGNOSES   Final diagnoses:  Influenza A     Rx / DC Orders   ED Discharge Orders          Ordered    ondansetron  (ZOFRAN -ODT) 4 MG disintegrating tablet  Every 8 hours PRN        07/25/24 0126    oseltamivir  (TAMIFLU ) 75 MG capsule  2 times daily        07/25/24 0210             Note:  This document was prepared using Dragon voice recognition software and may include unintentional dictation errors.    Cyrena Mylar, MD 07/25/24 [phone]  "

## 2024-07-25 NOTE — Discharge Instructions (Addendum)
 You tested positive for influenza.  Take Tylenol  650 mg every 6 hours as needed for fever/aches/pain.  Zofran  can help with nausea, take as directed.  Drink plenty of fluids to stay well-hydrated.  Take Tamiflu  for 5 days as prescribed.  Thank you for choosing us  for your health care today!  Please see your primary doctor this week for a follow up appointment.   If you have any new, worsening, or unexpected symptoms call your doctor right away or come back to the emergency department for reevaluation.  It was my pleasure to care for you today.   Ginnie EDISON Cyrena, MD

## 2024-07-26 ENCOUNTER — Encounter

## 2024-07-30 ENCOUNTER — Ambulatory Visit (INDEPENDENT_AMBULATORY_CARE_PROVIDER_SITE_OTHER)

## 2024-07-30 DIAGNOSIS — Z0000 Encounter for general adult medical examination without abnormal findings: Secondary | ICD-10-CM | POA: Diagnosis not present

## 2024-07-30 NOTE — Progress Notes (Signed)
 "  Chief Complaint  Patient presents with   Medicare Wellness     Subjective:   [name] is a 69 y.o. female who presents for a Medicare Annual Wellness Visit.  Visit info / Clinical Intake: Medicare Wellness Visit Type:: Subsequent Annual Wellness Visit Persons participating in visit and providing information:: patient Medicare Wellness Visit Mode:: Telephone If telephone:: video error Since this visit was completed virtually, some vitals may be partially provided or unavailable. Missing vitals are due to the limitations of the virtual format.: Unable to obtain vitals - no equipment If Telephone or Video please confirm:: I connected with patient using audio/video enable telemedicine. I verified patient identity with two identifiers, discussed telehealth limitations, and patient agreed to proceed. Patient Location:: home Provider Location:: office Interpreter Needed?: No Pre-visit prep was completed: yes AWV questionnaire completed by patient prior to visit?: no Living arrangements:: (!) lives alone Patient's Overall Health Status Rating: very good Typical amount of pain: none Does pain affect daily life?: no Are you currently prescribed opioids?: no  Dietary Habits and Nutritional Risks How many meals a day?: 3 Eats fruit and vegetables daily?: yes Most meals are obtained by: preparing own meals In the last 2 weeks, have you had any of the following?: none Diabetic:: (!) yes Any non-healing wounds?: no How often do you check your BS?: 0 Would you like to be referred to a Nutritionist or for Diabetic Management? : no  Functional Status Activities of Daily Living (to include ambulation/medication): Independent Ambulation: Independent Medication Administration: Independent Home Management (perform basic housework or laundry): Independent Manage your own finances?: yes Primary transportation is: driving Concerns about vision?: no *vision screening is required for [facility]*  (readers- MD in [place]) Concerns about hearing?: no  Fall Screening Falls in the past year?: 0 Number of falls in past year: 0 Was there an injury with Fall?: 0 Fall Risk Category Calculator: 0 Patient Fall Risk Level: Low Fall Risk  Fall Risk Patient at Risk for Falls Due to: No Fall Risks Fall risk Follow up: Falls evaluation completed; Falls prevention discussed  Home and Transportation Safety: All rugs have non-skid backing?: yes All stairs or steps have railings?: N/A, no stairs Grab bars in the bathtub or shower?: yes Have non-skid surface in bathtub or shower?: yes Good home lighting?: yes Regular seat belt use?: yes Hospital stays in the last year:: no  Cognitive Assessment Difficulty concentrating, remembering, or making decisions? : no Will 6CIT or Mini Cog be Completed: yes What year is it?: 0 points What month is it?: 0 points Give patient an address phrase to remember (5 components): [address] [place], [place] what time is it?: 0 points Count backwards from 20 to 1: 0 points Say the months of the year in reverse: 0 points Repeat the address phrase from earlier: 0 points 6 CIT Score: 0 points  Advance Directives (For Healthcare) Does Patient Have a Medical Advance Directive?: No Would patient like information on creating a medical advance directive?: No - Patient declined  Reviewed/Updated  Reviewed/Updated: Reviewed All (Medical, Surgical, Family, Medications, Allergies, Care Teams, Patient Goals)    Allergies (verified) Aspirin , Hydrochlorothiazide, Ibuprofen , Lisinopril, and Sulfa antibiotics   Current Medications (verified) Outpatient Encounter Medications as of 07/30/2024  Medication Sig   albuterol  (VENTOLIN  HFA) 108 (90 Base) MCG/ACT inhaler USE 1 TO 2 INHALATIONS BY MOUTH  EVERY 6 HOURS AS NEEDED FOR  WHEEZING OR SHORTNESS OF BREATH   amLODipine  (NORVASC ) 5 MG tablet Take  1 tablet (5 mg total) by mouth daily.   atorvastatin  (LIPITOR)  40 MG tablet Take 1 tablet (40 mg total) by mouth daily.   azelastine  (OPTIVAR ) 0.05 % ophthalmic solution Place 1 drop into both eyes 2 (two) times daily. For up to 8 weeks   CALCIUM  PO    clopidogrel  (PLAVIX ) 75 MG tablet Take 1 tablet (75 mg total) by mouth daily.   Fish Oil-Cholecalciferol  (FISH OIL + D3 PO)    fluticasone  (FLONASE ) 50 MCG/ACT nasal spray Place 2 sprays into both nostrils daily.   metFORMIN  (GLUCOPHAGE -XR) 750 MG 24 hr tablet Take 1 tablet (750 mg total) by mouth 2 (two) times daily before a meal.   montelukast  (SINGULAIR ) 10 MG tablet Take 1 tablet (10 mg total) by mouth at bedtime.   ondansetron  (ZOFRAN -ODT) 4 MG disintegrating tablet Take 1 tablet (4 mg total) by mouth every 8 (eight) hours as needed for nausea or vomiting.   oseltamivir  (TAMIFLU ) 75 MG capsule Take 1 capsule (75 mg total) by mouth 2 (two) times daily for 5 days.   pantoprazole  (PROTONIX ) 40 MG tablet Take 1 tablet (40 mg total) by mouth daily.   PREMARIN  vaginal cream INSERT 0.5 GRAMS IN THE VAGINA NIGHTLY FOR 2 WEEKS, THEN TWICE A WEEK AFTER.   tiZANidine  (ZANAFLEX ) 4 MG tablet Take 1 tablet (4 mg total) by mouth every 8 (eight) hours as needed for muscle spasms.   ondansetron  (ZOFRAN ) 4 MG tablet Take 1 tablet (4 mg total) by mouth every 8 (eight) hours as needed for nausea or vomiting.   No facility-administered encounter medications on file as of 07/30/2024.    History: Past Medical History:  Diagnosis Date   Allergy    Anemia    takes Ferrous Sulfate  daily   Arthritis    GERD (gastroesophageal reflux disease)    takes OTC med   Heart murmur    Hyperlipidemia    Hypertension    takes Amlodipine  daily   Low back pain    occasionally   Past Surgical History:  Procedure Laterality Date   ANTERIOR CERVICAL DECOMP/DISCECTOMY FUSION  10/19/2011   Procedure: ANTERIOR CERVICAL DECOMPRESSION/DISCECTOMY FUSION 1 LEVEL/HARDWARE REMOVAL;  Surgeon: Arley SHAUNNA Helling, MD;  Location: [facility];  Service:  Neurosurgery;  Laterality: N/A;  Cervical four - five  Anterior cervical decompression fusion with redo at six - seven  Rm 33   CARPAL TUNNEL RELEASE     COLONOSCOPY  08/2014   polyps   COLONOSCOPY WITH PROPOFOL  N/A 02/04/2020   Procedure: COLONOSCOPY WITH PROPOFOL ;  Surgeon: Jinny Carmine, MD;  Location: [facility];  Service: Endoscopy;  Laterality: N/A;   ESOPHAGOGASTRODUODENOSCOPY (EGD) WITH PROPOFOL  N/A 06/23/2015   Procedure: ESOPHAGOGASTRODUODENOSCOPY (EGD) WITH PROPOFOL ;  Surgeon: Carmine Jinny, MD;  Location: [facility];  Service: Endoscopy;  Laterality: N/A;   LIPOMA EXCISION Left 03/29/2022   Procedure: EXCISION LIPOMA, left upper extremity;  Surgeon: Desiderio Schanz, MD;  Location: [facility];  Service: General;  Laterality: Left;   NECK SURGERY     POSTERIOR CERVICAL FUSION/FORAMINOTOMY N/A 08/26/2013   Procedure: C/4-5,C/5-6,C/6-7 Posterior Cervical Fusion w/lateral mass fixation;  Surgeon: Arley SHAUNNA Helling, MD;  Location: [facility];  Service: Neurosurgery;  Laterality: N/A;   TUBAL LIGATION     Family History  Problem Relation Age of Onset   Hypertension Mother    Heart disease Mother    Kidney failure Sister    Breast cancer Neg Hx    Bladder Cancer Neg Hx  Uterine cancer Neg Hx    Social History   Occupational History   Occupation: disable  Tobacco Use   Smoking status: Never   Smokeless tobacco: Never  Vaping Use   Vaping status: Never Used  Substance and Sexual Activity   Alcohol use: No   Drug use: No   Sexual activity: Not Currently    Partners: Male    Birth control/protection: Post-menopausal, Surgical   Tobacco Counseling Counseling given: Not Answered  SDOH Screenings   Food Insecurity: Patient Declined (08/30/2023)  Recent Concern: Food Insecurity - Food Insecurity Present (07/06/2023)  Housing: Patient Declined (08/30/2023)  Transportation Needs: Patient Declined (08/30/2023)  Utilities: Patient Declined (08/30/2023)  Alcohol Screen: Low Risk  (01/05/2024)  Depression (PHQ2-9): Low Risk (01/05/2024)  Financial Resource Strain: Low Risk (01/05/2024)  Physical Activity: Insufficiently Active (07/06/2023)  Social Connections: Socially Isolated (07/06/2023)  Stress: No Stress Concern Present (01/05/2024)  Tobacco Use: Low Risk (07/30/2024)  Health Literacy: Adequate Health Literacy (01/05/2024)   See flowsheets for full screening details  Depression Screen PHQ 2 & 9 Depression Scale- Over the past 2 weeks, how often have you been bothered by any of the following problems? Little interest or pleasure in doing things: 0 Feeling down, depressed, or hopeless (PHQ Adolescent also includes...irritable): 0 PHQ-2 Total Score: 0 Trouble falling or staying asleep, or sleeping too much: 0 Feeling tired or having little energy: 0 Poor appetite or overeating (PHQ Adolescent also includes...weight loss): 0 Feeling bad about yourself - or that you are a failure or have let yourself or your family down: 0 Trouble concentrating on things, such as reading the newspaper or watching television (PHQ Adolescent also includes...like school work): 0 Moving or speaking so slowly that other people could have noticed. Or the opposite - being so fidgety or restless that you have been moving around a lot more than usual: 0 Thoughts that you would be better off dead, or of hurting yourself in some way: 0 PHQ-9 Total Score: 0 If you checked off any problems, how difficult have these problems made it for you to do your work, take care of things at home, or get along with other people?: Not difficult at all     Goals Addressed             This Visit's Progress    DIET - REDUCE SUGAR INTAKE               Objective:    There were no vitals filed for this visit. There is no height or weight on file to calculate BMI.  Hearing/Vision screen No results found. Immunizations and Health Maintenance Health Maintenance  Topic Date Due   Zoster Vaccines-  Shingrix (2 of 2) 08/22/2019   Influenza Vaccine  02/23/2024   COVID-19 Vaccine (1 - 2025-26 season) 03/25/2024   Mammogram  05/07/2024   Medicare Annual Wellness (AWV)  07/30/2025   Colonoscopy  02/04/2027   DTaP/Tdap/Td (4 - Td or Tdap) 01/01/2029   Pneumococcal Vaccine: 50+ Years  Completed   Bone Density Scan  Completed   Hepatitis C Screening  Completed   Meningococcal B Vaccine  Aged Out        Assessment/Plan:  This is a routine wellness examination for [name].  Patient Care Team: Sharma Coyer, MD as PCP - General (Family Medicine) Jinny Carmine, MD as Consulting Physician (Gastroenterology) Onetha Kuba, MD as Consulting Physician (Neurosurgery)  I have personally reviewed and noted the following in the patients chart:  Medical and social history Use of alcohol, tobacco or illicit drugs  Current medications and supplements including opioid prescriptions. Functional ability and status Nutritional status Physical activity Advanced directives List of other physicians Hospitalizations, surgeries, and ER visits in previous 12 months Vitals Screenings to include cognitive, depression, and falls Referrals and appointments  No orders of the defined types were placed in this encounter.  In addition, I have reviewed and discussed with patient certain preventive protocols, quality metrics, and best practice recommendations. A written personalized care plan for preventive services as well as general preventive health recommendations were provided to patient.   Jhonnie GORMAN Das, LPN   02/25/7972   Return in 1 year (on 07/30/2025).  After Visit Summary: (MyChart) Due to this being a telephonic visit, the after visit summary with patients personalized plan was offered to patient via MyChart   Nurse Notes: NEEDS FLU; MAMMOGRAM SCHEDULED; UTD ON COLONOSCOPY & BDS   "

## 2024-07-30 NOTE — Patient Instructions (Addendum)
 [name],  Thank you for taking the time for your Medicare Wellness Visit. I appreciate your continued commitment to your health goals. Please review the care plan we discussed, and feel free to reach out if I can assist you further.  Please note that Annual Wellness Visits do not include a physical exam. Some assessments may be limited, especially if the visit was conducted virtually. If needed, we may recommend an in-person follow-up with your provider.  Ongoing Care Seeing your primary care provider every 3 to 6 months helps us  monitor your health and provide consistent, personalized care. APPT FOR PHYSICAL ON 10/15/24 @ 3:20 PM  Referrals If a referral was made during today's visit and you haven't received any updates within two weeks, please contact the referred provider directly to check on the status.  Recommended Screenings:  Health Maintenance  Topic Date Due   Zoster (Shingles) Vaccine (2 of 2) 08/22/2019   Flu Shot  02/23/2024   COVID-19 Vaccine (1 - 2025-26 season) 03/25/2024   Breast Cancer Screening  05/07/2024   Osteoporosis screening with Bone Density Scan  07/27/2025   Medicare Annual Wellness Visit  07/30/2025   Colon Cancer Screening  02/04/2027   DTaP/Tdap/Td vaccine (4 - Td or Tdap) 01/01/2029   Pneumococcal Vaccine for age over 36  Completed   Hepatitis C Screening  Completed   Meningitis B Vaccine  Aged Out     Vision: Annual vision screenings are recommended for early detection of glaucoma, cataracts, and diabetic retinopathy. These exams can also reveal signs of chronic conditions such as diabetes and high blood pressure.  Dental: Annual dental screenings help detect early signs of oral cancer, gum disease, and other conditions linked to overall health, including heart disease and diabetes.  Please see the attached documents for additional preventive care recommendations.   NEXT AWV 08/05/25 @ 2:30 PM IN PERSON

## 2024-08-05 ENCOUNTER — Encounter: Payer: Self-pay | Admitting: *Deleted

## 2024-08-21 ENCOUNTER — Other Ambulatory Visit: Payer: Self-pay | Admitting: Family Medicine

## 2024-08-21 ENCOUNTER — Ambulatory Visit
Admission: RE | Admit: 2024-08-21 | Discharge: 2024-08-21 | Disposition: A | Discharge status: Home / Self Care | Source: Ambulatory Visit | Attending: Family Medicine | Admitting: Family Medicine

## 2024-08-21 DIAGNOSIS — E7800 Pure hypercholesterolemia, unspecified: Secondary | ICD-10-CM

## 2024-08-21 DIAGNOSIS — Z1231 Encounter for screening mammogram for malignant neoplasm of breast: Secondary | ICD-10-CM | POA: Insufficient documentation

## 2024-08-29 ENCOUNTER — Ambulatory Visit: Admitting: Family Medicine

## 2024-08-29 ENCOUNTER — Encounter: Payer: Self-pay | Admitting: Family Medicine

## 2024-08-29 VITALS — BP 136/78 | HR 62 | Temp 97.9°F | Ht 65.0 in | Wt 170.0 lb

## 2024-08-29 DIAGNOSIS — I10 Essential (primary) hypertension: Secondary | ICD-10-CM

## 2024-08-29 DIAGNOSIS — H66002 Acute suppurative otitis media without spontaneous rupture of ear drum, left ear: Secondary | ICD-10-CM | POA: Diagnosis not present

## 2024-08-29 MED ORDER — AMOXICILLIN 875 MG PO TABS: 875.0000 mg | ORAL_TABLET | Freq: Two times a day (BID) | ORAL | 0 refills | Status: AC

## 2024-08-29 NOTE — Patient Instructions (Signed)
 To keep you healthy, please keep in mind the following health maintenance items that you are due for:   Health Maintenance Due  Topic Date Due   Zoster Vaccines- Shingrix (2 of 2) 08/22/2019   Influenza Vaccine  02/23/2024   COVID-19 Vaccine (1 - 2025-26 season) 03/25/2024     Best Wishes,   Dr. Lang

## 2024-08-29 NOTE — Assessment & Plan Note (Signed)
 Chronic  Blood pressure recorded at 147/81 mmHg. Currently managed with amlodipine  5 mg daily. Previous higher dose caused adverse effects including swelling and headache. - Continue amlodipine  5 mg daily. - Monitor blood pressure daily until next visit in March. - Rechecked blood pressure before leaving the clinic.

## 2024-08-29 NOTE — Progress Notes (Signed)
 "  Acute Office Visit  Patient ID: [name], female    DOB: 09/21/1955, 69 y.o.   MRN: [id number]  PCP: Sharma Coyer, MD  Chief Complaint  Patient presents with   Otalgia    Patient reports soreness, itchiness and fullness feeling in left ear x 2-3 days.  Using a friends ear drops, neomycin and hydrocortisone.     Subjective:     HPI  Discussed the use of AI scribe software for clinical note transcription with the patient, who gave verbal consent to proceed.  History of Present Illness [name] is a 69 year old female who presents with left ear discomfort and itching.  She has been experiencing discomfort and itching in her left ear, describing the sensation as 'real itchy inside' with the outside being 'swollen and sore'. Lying on the affected side exacerbates the feeling of the ear 'closing up', preventing her from sleeping on that side.  Earlier this year, she had the flu, which she has since recovered from. However, she continues to experience a lot of phlegm, described as 'bling', that she needs to spit out, although she is not actively coughing it up.  She has been using ear drops given by her granddaughter's mother, starting the day before the visit. She attempted to remove ear wax using a hair clipper and a cotton swab with peroxide, which may have contributed to the irritation.  She recently stopped taking metformin  for prediabetes due to nausea, which she attributes to the medication. Her last A1c was 6.0 in June of the previous year. She is currently on amlodipine  5 mg for blood pressure management, having previously been on 10 mg, which caused headaches and swelling.  No sore throat, vomiting, or stinging pain in the ear. No history of swimming or excessive water exposure. She does not think she has any antibiotic allergies, but avoids sulfa antibiotics.   ROS     Objective:    BP 136/78 (BP Location: Right Arm, Patient Position: Sitting,  Cuff Size: Normal)   Pulse 62   Temp 97.9 F (36.6 C) (Oral)   Ht 5' 5 (1.651 m)   Wt 170 lb (77.1 kg)   SpO2 100%   BMI 28.29 kg/m  BP Readings from Last 3 Encounters:  08/29/24 136/78  07/25/24 (!) 157/81  05/23/24 (!) 158/81   Wt Readings from Last 3 Encounters:  08/29/24 170 lb (77.1 kg)  05/23/24 156 lb (70.8 kg)  01/05/24 164 lb (74.4 kg)      Physical Exam Vitals reviewed.  Constitutional:      General: She is not in acute distress.    Appearance: Normal appearance. She is not ill-appearing.  Pulmonary:     Effort: Pulmonary effort is normal. No respiratory distress.  Neurological:     Mental Status: She is alert and oriented to person, place, and time.  Psychiatric:        Mood and Affect: Mood normal.        Behavior: Behavior normal.        Thought Content: Thought content normal.       No results found for any visits on 08/29/24.  Lab Results  Component Value Date   HGBA1C 6.0 (H) 01/15/2024       Assessment & Plan:   Problem List Items Addressed This Visit     Primary hypertension   Chronic  Blood pressure recorded at 147/81 mmHg. Currently managed with amlodipine  5 mg daily. Previous higher dose caused  adverse effects including swelling and headache. - Continue amlodipine  5 mg daily. - Monitor blood pressure daily until next visit in March. - Rechecked blood pressure before leaving the clinic.      Other Visit Diagnoses       Non-recurrent acute suppurative otitis media of left ear without spontaneous rupture of tympanic membrane    -  Primary   Relevant Medications   amoxicillin  (AMOXIL ) 875 MG tablet       Assessment and Plan Assessment & Plan Acute suppurative otitis media of the left ear Acute infection of the left ear with erythema on the anterior rim of the eardrum, effusion, swelling in the ear canal, and discomfort during examination. Possible cellulitis or irritation due to foreign object insertion. Rupture of the  eardrum. - Prescribed amoxicillin  875 mg twice daily for 7 days. - Advised against inserting objects into the ear canal.   Prediabetes Last A1c of 6.0% in June of last year. Metformin  was discontinued due to nausea and adverse effects. No current diabetes diagnosis. - Discontinued metformin .    Meds ordered this encounter  Medications   amoxicillin  (AMOXIL ) 875 MG tablet    Sig: Take 1 tablet (875 mg total) by mouth 2 (two) times daily for 7 days.    Dispense:  14 tablet    Refill:  0    Return in about 1 month (around 09/26/2024).  Rockie Agent, MD [facility]   "

## 2024-10-15 ENCOUNTER — Encounter: Admitting: Family Medicine

## 2025-08-05 ENCOUNTER — Ambulatory Visit
